# Patient Record
Sex: Male | Born: 1955 | Race: White | Hispanic: No | Marital: Married | Smoking: Former smoker
Health system: Southern US, Community
[De-identification: ages and names within clinical notes are randomized; demographics above are authoritative.]

## PROBLEM LIST (undated history)

## (undated) DIAGNOSIS — R011 Cardiac murmur, unspecified: Secondary | ICD-10-CM

## (undated) DIAGNOSIS — N289 Disorder of kidney and ureter, unspecified: Secondary | ICD-10-CM

## (undated) DIAGNOSIS — I10 Essential (primary) hypertension: Secondary | ICD-10-CM

## (undated) DIAGNOSIS — R7309 Other abnormal glucose: Secondary | ICD-10-CM

## (undated) DIAGNOSIS — A159 Respiratory tuberculosis unspecified: Secondary | ICD-10-CM

## (undated) DIAGNOSIS — M545 Low back pain, unspecified: Secondary | ICD-10-CM

## (undated) DIAGNOSIS — E119 Type 2 diabetes mellitus without complications: Secondary | ICD-10-CM

## (undated) DIAGNOSIS — R7611 Nonspecific reaction to tuberculin skin test without active tuberculosis: Secondary | ICD-10-CM

## (undated) DIAGNOSIS — Z87442 Personal history of urinary calculi: Secondary | ICD-10-CM

## (undated) HISTORY — DX: Low back pain, unspecified: M54.50

## (undated) HISTORY — PX: HERNIA REPAIR: SHX51

## (undated) HISTORY — PX: COLONOSCOPY: SHX174

## (undated) HISTORY — DX: Morbid (severe) obesity due to excess calories: E66.01

## (undated) HISTORY — PX: KNEE SURGERY: SHX244

## (undated) HISTORY — DX: Cardiac murmur, unspecified: R01.1

## (undated) HISTORY — PX: WISDOM TOOTH EXTRACTION: SHX21

## (undated) HISTORY — DX: Other abnormal glucose: R73.09

---
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
## 2007-10-09 ENCOUNTER — Ambulatory Visit (HOSPITAL_COMMUNITY): Admission: RE | Admit: 2007-10-09 | Discharge: 2007-10-09 | Payer: Self-pay | Admitting: Internal Medicine

## 2007-10-23 ENCOUNTER — Ambulatory Visit: Payer: Self-pay

## 2008-12-21 ENCOUNTER — Ambulatory Visit (HOSPITAL_COMMUNITY): Admission: RE | Admit: 2008-12-21 | Discharge: 2008-12-21 | Payer: Self-pay | Admitting: Internal Medicine

## 2009-12-16 DIAGNOSIS — Z87442 Personal history of urinary calculi: Secondary | ICD-10-CM

## 2009-12-16 HISTORY — DX: Personal history of urinary calculi: Z87.442

## 2010-04-15 HISTORY — PX: CYSTOSCOPY/RETROGRADE/URETEROSCOPY/STONE EXTRACTION WITH BASKET: SHX5317

## 2010-07-08 ENCOUNTER — Emergency Department (HOSPITAL_COMMUNITY): Admission: EM | Admit: 2010-07-08 | Discharge: 2010-07-08 | Payer: Self-pay | Admitting: Emergency Medicine

## 2010-07-26 ENCOUNTER — Ambulatory Visit (HOSPITAL_BASED_OUTPATIENT_CLINIC_OR_DEPARTMENT_OTHER): Admission: RE | Admit: 2010-07-26 | Discharge: 2010-07-26 | Payer: Self-pay | Admitting: Urology

## 2011-03-01 LAB — POCT I-STAT 4, (NA,K, GLUC, HGB,HCT)
Glucose, Bld: 114 mg/dL — ABNORMAL HIGH (ref 70–99)
HCT: 52 % (ref 39.0–52.0)
Potassium: 3.8 mEq/L (ref 3.5–5.1)
Sodium: 142 mEq/L (ref 135–145)

## 2011-03-02 LAB — POCT I-STAT, CHEM 8
Calcium, Ion: 0.88 mmol/L — ABNORMAL LOW (ref 1.12–1.32)
Creatinine, Ser: 1.1 mg/dL (ref 0.4–1.5)
Glucose, Bld: 92 mg/dL (ref 70–99)
Hemoglobin: 17 g/dL (ref 13.0–17.0)

## 2011-03-02 LAB — URINALYSIS, ROUTINE W REFLEX MICROSCOPIC
Protein, ur: NEGATIVE mg/dL
Specific Gravity, Urine: 1.024 (ref 1.005–1.030)
pH: 5 (ref 5.0–8.0)

## 2011-11-22 ENCOUNTER — Other Ambulatory Visit: Payer: Self-pay | Admitting: Orthopedic Surgery

## 2011-11-22 ENCOUNTER — Encounter (HOSPITAL_BASED_OUTPATIENT_CLINIC_OR_DEPARTMENT_OTHER): Payer: Self-pay | Admitting: *Deleted

## 2011-11-22 NOTE — Progress Notes (Signed)
Pt has had uri-treated-using tussinex as needed-no fever,said he is cleared up Had recent ekg at [facility]cp-[phone]-closed Friday-have to call Monday am for notes Arrive 1030 with daughter

## 2011-11-25 ENCOUNTER — Encounter (HOSPITAL_BASED_OUTPATIENT_CLINIC_OR_DEPARTMENT_OTHER): Payer: Self-pay | Admitting: *Deleted

## 2011-11-25 ENCOUNTER — Ambulatory Visit (HOSPITAL_BASED_OUTPATIENT_CLINIC_OR_DEPARTMENT_OTHER): Payer: BC Managed Care – PPO | Admitting: Anesthesiology

## 2011-11-25 ENCOUNTER — Encounter (HOSPITAL_BASED_OUTPATIENT_CLINIC_OR_DEPARTMENT_OTHER): Payer: Self-pay | Admitting: Anesthesiology

## 2011-11-25 ENCOUNTER — Encounter (HOSPITAL_BASED_OUTPATIENT_CLINIC_OR_DEPARTMENT_OTHER)
Admission: RE | Disposition: A | Discharge status: Home / Self Care | Payer: Self-pay | Source: Ambulatory Visit | Attending: Orthopedic Surgery

## 2011-11-25 ENCOUNTER — Ambulatory Visit (HOSPITAL_BASED_OUTPATIENT_CLINIC_OR_DEPARTMENT_OTHER)
Admission: RE | Admit: 2011-11-25 | Discharge: 2011-11-25 | Disposition: A | Discharge status: Home / Self Care | Payer: BC Managed Care – PPO | Source: Ambulatory Visit | Attending: Orthopedic Surgery | Admitting: Orthopedic Surgery

## 2011-11-25 DIAGNOSIS — M23329 Other meniscus derangements, posterior horn of medial meniscus, unspecified knee: Secondary | ICD-10-CM | POA: Insufficient documentation

## 2011-11-25 DIAGNOSIS — I10 Essential (primary) hypertension: Secondary | ICD-10-CM | POA: Insufficient documentation

## 2011-11-25 DIAGNOSIS — S83249A Other tear of medial meniscus, current injury, unspecified knee, initial encounter: Secondary | ICD-10-CM

## 2011-11-25 DIAGNOSIS — K08409 Partial loss of teeth, unspecified cause, unspecified class: Secondary | ICD-10-CM | POA: Insufficient documentation

## 2011-11-25 DIAGNOSIS — M23359 Other meniscus derangements, posterior horn of lateral meniscus, unspecified knee: Secondary | ICD-10-CM | POA: Insufficient documentation

## 2011-11-25 DIAGNOSIS — M2240 Chondromalacia patellae, unspecified knee: Secondary | ICD-10-CM | POA: Insufficient documentation

## 2011-11-25 DIAGNOSIS — F17200 Nicotine dependence, unspecified, uncomplicated: Secondary | ICD-10-CM | POA: Insufficient documentation

## 2011-11-25 HISTORY — DX: Essential (primary) hypertension: I10

## 2011-11-25 LAB — POCT I-STAT, CHEM 8
BUN: 20 mg/dL (ref 6–23)
Calcium, Ion: 1.16 mmol/L (ref 1.12–1.32)
Chloride: 102 mEq/L (ref 96–112)
HCT: 50 % (ref 39.0–52.0)
Sodium: 140 mEq/L (ref 135–145)

## 2011-11-25 SURGERY — ARTHROSCOPY, KNEE, WITH MEDIAL MENISCECTOMY
Anesthesia: General | Site: Knee | Laterality: Right | Wound class: Clean

## 2011-11-25 MED ORDER — OXYCODONE-ACETAMINOPHEN 5-325 MG PO TABS
1.0000 | ORAL_TABLET | ORAL | Status: AC | PRN
Start: 2011-11-25 — End: 2011-12-05

## 2011-11-25 MED ORDER — BUPIVACAINE HCL (PF) 0.25 % IJ SOLN
INTRAMUSCULAR | Status: DC | PRN
  Administered 2011-11-25: 20 mL

## 2011-11-25 MED ORDER — PROMETHAZINE HCL 25 MG/ML IJ SOLN: 6.2500 mg | INTRAMUSCULAR | Status: DC | PRN

## 2011-11-25 MED ORDER — CEFAZOLIN SODIUM-DEXTROSE 2-3 GM-% IV SOLR: 2.0000 g | INTRAVENOUS | Status: DC

## 2011-11-25 MED ORDER — FENTANYL CITRATE 0.05 MG/ML IJ SOLN
25.0000 ug | INTRAMUSCULAR | Status: DC | PRN
  Administered 2011-11-25 (×2): 25 ug via INTRAVENOUS
  Administered 2011-11-25: 50 ug via INTRAVENOUS

## 2011-11-25 MED ORDER — MIDAZOLAM HCL 5 MG/5ML IJ SOLN
INTRAMUSCULAR | Status: DC | PRN
  Administered 2011-11-25: 2 mg via INTRAVENOUS

## 2011-11-25 MED ORDER — POVIDONE-IODINE 7.5 % EX SOLN: Freq: Once | CUTANEOUS | Status: DC

## 2011-11-25 MED ORDER — OXYCODONE-ACETAMINOPHEN 5-325 MG PO TABS: 1.0000 | ORAL_TABLET | Freq: Once | ORAL | Status: DC

## 2011-11-25 MED ORDER — PROPOFOL 10 MG/ML IV EMUL
INTRAVENOUS | Status: DC | PRN
  Administered 2011-11-25: 200 mg via INTRAVENOUS

## 2011-11-25 MED ORDER — ONDANSETRON HCL 4 MG/2ML IJ SOLN
INTRAMUSCULAR | Status: DC | PRN
  Administered 2011-11-25: 4 mg via INTRAVENOUS

## 2011-11-25 MED ORDER — FENTANYL CITRATE 0.05 MG/ML IJ SOLN
INTRAMUSCULAR | Status: DC | PRN
  Administered 2011-11-25: 50 ug via INTRAVENOUS
  Administered 2011-11-25 (×2): 25 ug via INTRAVENOUS
  Administered 2011-11-25: 50 ug via INTRAVENOUS

## 2011-11-25 MED ORDER — CEFAZOLIN SODIUM 1-5 GM-% IV SOLN
INTRAVENOUS | Status: DC | PRN
  Administered 2011-11-25: 2 g via INTRAVENOUS

## 2011-11-25 MED ORDER — LACTATED RINGERS IV SOLN
INTRAVENOUS | Status: DC
  Administered 2011-11-25 (×2): via INTRAVENOUS

## 2011-11-25 MED ORDER — DEXAMETHASONE SODIUM PHOSPHATE 4 MG/ML IJ SOLN
INTRAMUSCULAR | Status: DC | PRN
  Administered 2011-11-25: 10 mg via INTRAVENOUS

## 2011-11-25 MED ORDER — SODIUM CHLORIDE 0.9 % IR SOLN
Status: DC | PRN
  Administered 2011-11-25: 3000 mL

## 2011-11-25 SURGICAL SUPPLY — 27 items
BANDAGE ELASTIC 6 VELCRO ST LF (GAUZE/BANDAGES/DRESSINGS) ×2 IMPLANT
BLADE 4.2CUDA (BLADE) ×2 IMPLANT
BLADE CUTTER GATOR 3.5 (BLADE) IMPLANT
BLADE CUTTER MENIS 5.5 (BLADE) IMPLANT
CANISTER OMNI JUG 16 LITER (MISCELLANEOUS) IMPLANT
CANISTER SUCTION 2500CC (MISCELLANEOUS) IMPLANT
CHLORAPREP W/TINT 26ML (MISCELLANEOUS) ×2 IMPLANT
CLOTH BEACON ORANGE TIMEOUT ST (SAFETY) ×2 IMPLANT
DRAPE ARTHROSCOPY W/POUCH 114 (DRAPES) ×2 IMPLANT
DRSG EMULSION OIL 3X3 NADH (GAUZE/BANDAGES/DRESSINGS) ×2 IMPLANT
GLOVE BIO SURGEON STRL SZ 6.5 (GLOVE) ×2 IMPLANT
GLOVE BIO SURGEON STRL SZ7.5 (GLOVE) ×2 IMPLANT
GLOVE BIOGEL PI IND STRL 7.0 (GLOVE) ×1 IMPLANT
GLOVE BIOGEL PI IND STRL 8 (GLOVE) ×1 IMPLANT
GLOVE BIOGEL PI INDICATOR 7.0 (GLOVE) ×1
GLOVE BIOGEL PI INDICATOR 8 (GLOVE) ×1
GOWN PREVENTION PLUS XLARGE (GOWN DISPOSABLE) ×2 IMPLANT
KNEE WRAP E Z 3 GEL PACK (MISCELLANEOUS) ×2 IMPLANT
PACK ARTHROSCOPY DSU (CUSTOM PROCEDURE TRAY) ×2 IMPLANT
PACK BASIN DAY SURGERY FS (CUSTOM PROCEDURE TRAY) ×2 IMPLANT
SPONGE GAUZE 4X4 12PLY (GAUZE/BANDAGES/DRESSINGS) ×2 IMPLANT
SUT ETHILON 4 0 PS 2 18 (SUTURE) ×2 IMPLANT
TOWEL OR 17X24 6PK STRL BLUE (TOWEL DISPOSABLE) ×2 IMPLANT
TOWEL OR NON WOVEN STRL DISP B (DISPOSABLE) ×2 IMPLANT
TUBING ARTHROSCOPY IRRIG 16FT (MISCELLANEOUS) ×2 IMPLANT
WAND STAR VAC 90 (SURGICAL WAND) IMPLANT
WATER STERILE IRR 1000ML POUR (IV SOLUTION) ×2 IMPLANT

## 2011-11-25 NOTE — Anesthesia Procedure Notes (Signed)
Procedure Name: LMA Insertion Date/Time: 11/25/2011 12:54 PM Performed by: Signa Kell Pre-anesthesia Checklist: Patient identified, Emergency Drugs available, Suction available and Patient being monitored Patient Re-evaluated:Patient Re-evaluated prior to inductionOxygen Delivery Method: Circle System Utilized Preoxygenation: Pre-oxygenation with 100% oxygen Intubation Type: IV induction Ventilation: Mask ventilation without difficulty LMA: LMA inserted LMA Size: 5.0 Number of attempts: 1 Airway Equipment and Method: bite block Placement Confirmation: positive ETCO2 Tube secured with: Tape Dental Injury: Teeth and Oropharynx as per pre-operative assessment

## 2011-11-25 NOTE — H&P (Signed)
Aaron Burns is an 55 y.o. male.   Chief Complaint: R knee pain and swelling  HPI: R knee pain and swelling x several years, MRI shows post MMT.  Past Medical History  Diagnosis Date  . Hypertension   . Upper respiratory infection 2012    recently tx-no fever now-clearing up  . Chronic kidney disease 5/11    kidney stone  . Flu 11/17/11    pt. states went to urgent care, had flu, took tamiflu, felt better in 2 days.    Past Surgical History  Procedure Date  . Hernia repair     as a baby  . Cystoscopy/retrograde/ureteroscopy/stone extraction with basket 5/11  . Wisdom tooth extraction     History reviewed. No pertinent family history. Social History:  reports that he has been smoking.  He does not have any smokeless tobacco history on file. He reports that he drinks alcohol. He reports that he does not use illicit drugs.  Allergies: No Known Allergies  Medications Prior to Admission  Medication Dose Route Frequency Provider Last Rate Last Dose  . ceFAZolin (ANCEF) IVPB 2 g/50 mL premix  2 g Intravenous 60 min Pre-Op       . lactated ringers infusion   Intravenous Continuous E. Jairo Ben, MD 20 mL/hr at 11/25/11 1058    . povidone-iodine (BETADINE) 7.5 % scrub   Topical Once        Medications Prior to Admission  Medication Sig Dispense Refill  . Ascorbic Acid (VITAMIN C) 1000 MG tablet Take 1,000 mg by mouth daily.        . chlorpheniramine-HYDROcodone (TUSSIONEX) 10-8 MG/5ML LQCR Take 5 mLs by mouth every 6 (six) hours as needed.        [name] co-enzyme Q-10 30 MG capsule Take 30 mg by mouth 3 (three) times daily.        . fish oil-omega-3 fatty acids 1000 MG capsule Take 2 g by mouth daily.        . hydrochlorothiazide (HYDRODIURIL) 25 MG tablet Take 25 mg by mouth daily.        [name] lisinopril (PRINIVIL,ZESTRIL) 10 MG tablet Take 10 mg by mouth daily.        . Multiple Vitamin (MULTIVITAMIN) capsule Take 1 capsule by mouth daily.        . nicotine polacrilex (NICORETTE) 2 MG  gum Take 2 mg by mouth as needed.          Results for orders placed during the hospital encounter of 11/25/11 (from the past 48 hour(s))  POCT I-STAT, CHEM 8     Status: Abnormal   Collection Time   11/25/11 11:12 AM      Component Value Range Comment   Sodium 140  135 - 145 (mEq/L)    Potassium 4.1  3.5 - 5.1 (mEq/L)    Chloride 102  96 - 112 (mEq/L)    BUN 20  6 - 23 (mg/dL)    Creatinine, Ser 4.09  0.50 - 1.35 (mg/dL)    Glucose, Bld 811 (*) 70 - 99 (mg/dL)    Calcium, Ion 9.14  1.12 - 1.32 (mmol/L)    TCO2 29  0 - 100 (mmol/L)    Hemoglobin 17.0  13.0 - 17.0 (g/dL)    HCT 78.2  95.6 - 21.3 (%)    No results found.  Review of Systems  All other systems reviewed and are negative.    Blood pressure 152/78, pulse 57, temperature 97.8 F (36.6 C), temperature  source Oral, resp. rate 18, height 5\' 9"  (1.753 m), weight 104.327 kg (230 lb), SpO2 96.00%. Physical Exam  Constitutional: He is oriented to person, place, and time. He appears well-developed and well-nourished.  HENT:  Head: Atraumatic.  Eyes: EOM are normal.  Neck: Neck supple.  Cardiovascular: Intact distal pulses.   Respiratory: Effort normal.  GI: Soft.  Musculoskeletal: Normal range of motion.       Right knee: tenderness found. Medial joint line tenderness noted.  Neurological: He is alert and oriented to person, place, and time.  Skin: Skin is warm and dry.  Psychiatric: He has a normal mood and affect.     Assessment/Plan R knee medial meniscus tear, failed non op mgmnt. Risks / benefits of surgery discussed Consent on chart  NPO for OR Preop antibiotics    Aaron Burns WILLIAM 11/25/2011, 12:18 PM

## 2011-11-25 NOTE — Anesthesia Postprocedure Evaluation (Signed)
  Anesthesia Post-op Note  Patient: Aaron Burns  Procedure(s) Performed:  KNEE ARTHROSCOPY WITH MEDIAL MENISECTOMY - Medial and Lateral Menisectomy Chondroplasty Trochlea and Medial and Lateral Femoral Condyle  Patient Location: PACU  Anesthesia Type: General  Level of Consciousness: awake, alert  and oriented  Airway and Oxygen Therapy: Patient Spontanous Breathing  Post-op Pain: none  Post-op Assessment: Post-op Vital signs reviewed, Patient's Cardiovascular Status Stable, Respiratory Function Stable, Patent Airway, No signs of Nausea or vomiting and Pain level controlled  Post-op Vital Signs: Reviewed and stable  Complications: No apparent anesthesia complications

## 2011-11-25 NOTE — Brief Op Note (Signed)
11/25/2011  1:40 PM  PATIENT:  Aaron Burns  55 y.o. 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  PROCEDURE:  Procedure(s): KNEE ARTHROSCOPY WITH MEDIAL MENISECTOMY, partial lateral meniscectomy and chondroplasty of MFC, LFC and trochlea  SURGEON:  Surgeon(s): Mable Paris, MD  PHYSICIAN ASSISTANT:   ASSISTANTS: none   ANESTHESIA:   general  EBL:  Total I/O In: 1000 [I.V.:1000] Out: - ebl min  BLOOD ADMINISTERED:none  DRAINS: none   LOCAL MEDICATIONS USED:  1/4% MARCAINE 20 CC  SPECIMEN:  No Specimen  DISPOSITION OF SPECIMEN:  N/A  COUNTS:  YES  TOURNIQUET:  * No tourniquets in log *  DICTATION: .Other Dictation: Dictation Number [id number]  PLAN OF CARE: Discharge to home after PACU  PATIENT DISPOSITION:  PACU - hemodynamically stable.   Delay start of Pharmacological VTE agent (>24hrs) due to surgical blood loss or risk of bleeding:  {YES/NO/NOT APPLICABLE:20182

## 2011-11-25 NOTE — Transfer of Care (Signed)
Immediate Anesthesia Transfer of Care Note  Patient: Aaron Burns  Procedure(s) Performed:  KNEE ARTHROSCOPY WITH MEDIAL MENISECTOMY - Medial and Lateral Menisectomy Chondroplasty Trochlea and Medial and Lateral Femoral Condyle  Patient Location: PACU  Anesthesia Type: General  Level of Consciousness: sedated  Airway & Oxygen Therapy: Patient Spontanous Breathing and Patient connected to face mask oxygen  Post-op Assessment: Report given to PACU RN and Post -op Vital signs reviewed and stable  Post vital signs: Reviewed and stable  Complications: No apparent anesthesia complications

## 2011-11-25 NOTE — Op Note (Signed)
NAMEABDULLOH, ULLOM NO.:  [id number]  MEDICAL RECORD NO.:  [id number]  LOCATION:                                 FACILITY:  PHYSICIAN:  Jones Broom, MD         DATE OF BIRTH:  DATE OF PROCEDURE:  11/25/2011 DATE OF DISCHARGE:                              OPERATIVE REPORT   PREOPERATIVE DIAGNOSES: 1. Right knee posterior horn medial meniscus tear. 2. Tricompartmental degenerative changes.  POSTOPERATIVE DIAGNOSES: 1. Right knee radial posterior horn medial meniscus tear. 2. Right knee lateral meniscus tear. 3. 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  PROCEDURE PERFORMED: 1. Right knee partial medial meniscectomy. 2. Right knee partial lateral meniscectomy. 3. Right knee chondroplasty of the medial femoral condyle and lateral     femoral condyle and trochlea.  ATTENDING SURGEON:  Jones Broom, M.D.  ASSISTANT:  None.  ANESTHESIA:  GETA with postoperative local anesthesia.  COMPLICATIONS:  None.  DRAINS:  None.  SPECIMENS:  None.  ESTIMATED BLOOD LOSS:  Minimal.  INDICATION FOR SURGERY:  The patient is a 55 year old gentleman with a long history of right knee pain with recurrent swelling and mechanical symptoms.  MRI revealed a posterior horn medial meniscus tear and some degenerative changes throughout the joint.  He wished to go ahead with arthroscopic treatment having failed nonoperative management.  He understood risks, benefits, alternatives of procedure, and wished to go ahead with it.  OPERATIVE FINDINGS:  Examination under anesthesia demonstrated no laxity or instability of ACL, PCL, or collaterals.  Range of motion was good. Trace knee effusion.  No warmth or erythema.  Diagnostic arthroscopy revealed the patellofemoral articulation to be normal.  There was a centrally a small area of  grade 3 and centrally grade 4 chondromalacia of the femoral trochlea with a small area of exposed bone.  There was some synovitis in the medial compartment.  The posterior horn medial meniscus had a small radial tear just off of the root.  This was debrided back to a stable base and the root was carefully probed, and the remaining meniscus was stable and non- displaceable.  There was some grade 3 changes on the medial femoral condyle diffusely, which were gently lightly debrided with the shaver. The lateral meniscus was noted to have a small radial tear at the junction of the posterior horn and body, extending into about a third of the width of the meniscus.  This was debrided back to a stable base with a biter and shaver.  There was a small area of grade 4 chondromalacia on the lateral femoral condyle along the articular weightbearing portion, which was debrided back to stable articular margins.  The small area in the trochlea was also debrided.  The ACL and PCL were intact.  No loose bodies were noted.  PROCEDURE:  The patient was identified in the preoperative holding area where I personally marked the operative site after verifying site, side, and procedure with the patient.  He was taken back to the  operating room where general anesthesia was induced without complication.  The right lower extremity is prepped and draped in a standard sterile fashion and placed in a leg-holder.  The foot of the bed was dropped away and the nonoperative extremity was carefully padded in position.  Appropriate time-out of the procedure was carried out.  The patient did receive preoperative Ancef IV.  After sterile prepping and draping, a standard lateral portal was established.  The arthroscope was introduced into the joint with diagnostic arthroscopy, the findings as described above. Medial portal was established just above the anterior horn medial meniscus under direct visualization.  The shaver was  used through this portal to perform debridement as described above.  The biter was also used to perform partial medial and lateral meniscectomies as described above.  Once the articular and meniscal work was completed as described in the findings, the shaver was again run in the joint to ensure no retain loose fragments and second tour of the joint revealed no further loose bodies.  The ACL and PCL were completely intact.  The arthroscopic equipment was then removed from the joint and the knee was insufflated with 20 mL of 0.25% Marcaine without epinephrine for postoperative pain control.  The portals were closed with 3-0 nylon in an interrupted fashion.  Sterile dressings were applied including Adaptic, 4x4s, ABDs, Webril, and a loosely wrapped Ace bandage around the knee.  He was then allowed to awake from general anesthesia, transferred to the stretcher and taken to the recovery room in stable condition.  POSTOPERATIVE PLAN:  He will be discharged home with his family today. He will follow up in 1 week for suture removal and wound check.  Begin some gentle range of motion exercises when comfortable.  He would be using icing.  He will be discharged home with Percocet for pain control. He will have aspirin daily over the next few weeks for VTE prophylaxis.     Jones Broom, MD     JC/MEDQ  D:  11/25/2011  T:  11/25/2011  Job:  [id number]

## 2011-11-25 NOTE — Anesthesia Preprocedure Evaluation (Addendum)
Anesthesia Evaluation  Patient identified by MRN, date of birth, ID band Patient awake    Reviewed: Allergy & Precautions, H&P , NPO status , Patient's Chart, lab work & pertinent test results  Airway Mallampati: II TM Distance: >3 FB Neck ROM: Full    Dental  (+) Partial Upper, Partial Lower, Chipped, Poor Dentition and Dental Advisory Given   Pulmonary Current Smoker,  clear to auscultation  Pulmonary exam normal       Cardiovascular hypertension (lisinopril), Pt. on medications Regular Normal    Neuro/Psych    GI/Hepatic negative GI ROS, Neg liver ROS,   Endo/Other  Morbid obesity  Renal/GU negative Renal ROS     Musculoskeletal   Abdominal (+) obese,   Peds  Hematology negative hematology ROS (+)   Anesthesia Other Findings   Reproductive/Obstetrics                          Anesthesia Physical Anesthesia Plan  ASA: II  Anesthesia Plan: General   Post-op Pain Management:    Induction: Intravenous  Airway Management Planned: LMA  Additional Equipment:   Intra-op Plan:   Post-operative Plan:   Informed Consent: I have reviewed the patients History and Physical, chart, labs and discussed the procedure including the risks, benefits and alternatives for the proposed anesthesia with the patient or authorized representative who has indicated his/her understanding and acceptance.   Dental advisory given  Plan Discussed with: CRNA and Surgeon  Anesthesia Plan Comments: (Plan routine monitors, GA- LMA OK)        Anesthesia Quick Evaluation

## 2012-06-09 ENCOUNTER — Ambulatory Visit (INDEPENDENT_AMBULATORY_CARE_PROVIDER_SITE_OTHER): Payer: BC Managed Care – PPO | Admitting: Surgery

## 2012-06-09 ENCOUNTER — Encounter (INDEPENDENT_AMBULATORY_CARE_PROVIDER_SITE_OTHER): Payer: Self-pay | Admitting: Surgery

## 2012-06-09 VITALS — BP 117/62 | HR 77 | Temp 98.6°F | Ht 69.0 in | Wt 243.4 lb

## 2012-06-09 DIAGNOSIS — K8020 Calculus of gallbladder without cholecystitis without obstruction: Secondary | ICD-10-CM

## 2012-06-09 DIAGNOSIS — R109 Unspecified abdominal pain: Secondary | ICD-10-CM

## 2012-06-09 NOTE — Patient Instructions (Signed)
Abdominal or Pelvic Ultrasound Ultrasound uses harmless sound waves instead of X-rays to take pictures of the inside of your body. A probe or wand device (transducer) is held up against your body to capture these pictures. The continually changing images can be recorded on videotape or film. Diagnostic ultrasound imaging is commonly called sonography or ultrasonography. There are different types of ultrasound exams. An ultrasound of the gallbladder, liver, and pancreas can show gallstones, masses, cysts, inflammation, infection, or enlarged organs. An ultrasound of the kidneys can show cysts, masses, kidney stones, and kidney size and shape. A pelvic ultrasound can show the uterus, ovaries, and cysts or masses. An obstetrical ultrasound shows the position of the fetus, measurements for maturity, fetal heartbeat, and fetal organs. A breast, thyroid, or testicular ultrasound can show if a nodule is solid or cystic. RISKS AND COMPLICATIONS Ultrasound has been used for many years and has never shown any harmful effects. Studies in humans have shown no direct link between the use of ultrasound and adverse outcomes. BEFORE THE PROCEDURE Other than drinking water, do not eat or drink for 8 to 12 hours before the test or as directed by your caregiver. Follow any other diet instructions from your caregiver. If you are having a pelvic ultrasound, you may need to drink a lot of liquid before the exam. A full bladder helps to see the organs behind the bladder better. PROCEDURE  There is no pain in an ultrasound exam. A gel is applied to your skin, and the transducer is then placed on the area to be examined. The gel may feel cool. The gel wipes off easily, but it is a good idea to wear clothing that is easily washable. The images from inside your body are displayed on one or more monitors that look like small television screens. The returning sound waves produce pictures of the organs that were in the path of the sound  sent from the transducer. The room is usually darkened during the exam. This makes it easier to see the images on the monitor. The ultrasound exam should take less than 1 hour. AFTER THE PROCEDURE You can safely drive home and return to regular activities immediately after the exam. Ask when your test results will be ready. Make sure you get your test results. Document Released: 11/29/2000 Document Revised: 11/21/2011 Document Reviewed: 05/23/2011 [employer] Patient Information 2012 [employer]. 

## 2012-06-09 NOTE — Progress Notes (Signed)
Patient ID: Aaron Burns, male   DOB: March 06, 1956, 56 y.o.   MRN: [id number]  Chief Complaint  Patient presents with  . Pre-op Exam    eval gallbladder with stones    HPI Aaron Burns is a 56 y.o. male.   HPIPatient presents at the request of Dr. Annabell Howells of urology due to right lower back pain. He was evaluated by Dr. Ma Rings by CT which showed a large gallstone but no kidney stone. He has a two-week history of constant right lower back pain. Eating does not make it better or worse. Denies nausea or vomiting. The pain seems to be more along his right flank and right side now is constant in nature. Nothing seems to make it better or worse. He does have more pain when he twists or movements.  Past Medical History  Diagnosis Date  . Hypertension   . Upper respiratory infection 2012    recently tx-no fever now-clearing up  . Chronic kidney disease 5/11    kidney stone  . Flu 11/17/11    pt. states went to urgent care, had flu, took tamiflu, felt better in 2 days.  [name] Heart murmur     at birth    Past Surgical History  Procedure Date  . Hernia repair     as a baby  . Cystoscopy/retrograde/ureteroscopy/stone extraction with basket 5/11  . Wisdom tooth extraction   . Knee surgery 11/25/11    Family History  Problem Relation Age of Onset  . Hypertension Mother     Social History History  Substance Use Topics  . Smoking status: Current Some Day Smoker -- 0.2 packs/day    Types: Cigarettes  . Smokeless tobacco: Not on file   Comment: 2-3 cigs a day  . Alcohol Use: Yes     occasional    No Known Allergies  Current Outpatient Prescriptions  Medication Sig Dispense Refill  . Ascorbic Acid (VITAMIN C) 1000 MG tablet Take 1,000 mg by mouth daily.        . chlorpheniramine-HYDROcodone (TUSSIONEX) 10-8 MG/5ML LQCR Take 5 mLs by mouth every 6 (six) hours as needed.        [name] co-enzyme Q-10 30 MG capsule Take 30 mg by mouth 3 (three) times daily.        . fish oil-omega-3 fatty acids 1000 MG  capsule Take 2 g by mouth daily.        . hydrochlorothiazide (HYDRODIURIL) 25 MG tablet Take 25 mg by mouth daily.        [name] lisinopril (PRINIVIL,ZESTRIL) 10 MG tablet Take 10 mg by mouth daily.        . meloxicam (MOBIC) 15 MG tablet Take 15 mg by mouth daily.      . Multiple Vitamin (MULTIVITAMIN) capsule Take 1 capsule by mouth daily.        . nicotine polacrilex (NICORETTE) 2 MG gum Take 2 mg by mouth as needed.          Review of Systems Review of Systems  Constitutional: Negative for fever, chills and unexpected weight change.  HENT: Negative for hearing loss, congestion, sore throat, trouble swallowing and voice change.   Eyes: Negative for visual disturbance.  Respiratory: Negative for cough and wheezing.   Cardiovascular: Negative for chest pain, palpitations and leg swelling.  Gastrointestinal: Negative for nausea, vomiting, abdominal pain, diarrhea, constipation, blood in stool, abdominal distention, anal bleeding and rectal pain.  Genitourinary: Negative for hematuria and difficulty urinating.  Musculoskeletal: Negative for arthralgias.  Skin: Negative for rash and wound.  Neurological: Negative for seizures, syncope, weakness and headaches.  Hematological: Negative for adenopathy. Does not bruise/bleed easily.  Psychiatric/Behavioral: Negative for confusion.    Blood pressure 117/62, pulse 77, temperature 98.6 F (37 C), temperature source Temporal, height 5\' 9"  (1.753 m), weight 243 lb 6.4 oz (110.406 kg), SpO2 96.00%.  Physical Exam Physical Exam  Constitutional: He is oriented to person, place, and time. He appears well-developed and well-nourished.  HENT:  Head: Normocephalic and atraumatic.  Eyes: EOM are normal. Pupils are equal, round, and reactive to light.  Neck: Normal range of motion. Neck supple.  Cardiovascular: Normal rate and regular rhythm.   Pulmonary/Chest: Effort normal and breath sounds normal.  Abdominal: Soft. Bowel sounds are normal. There is no  tenderness. There is no rebound.  Musculoskeletal:       Arms: Neurological: He is alert and oriented to person, place, and time.  Skin: Skin is warm and dry.  Psychiatric: He has a normal mood and affect. His behavior is normal. Judgment and thought content normal.    Data Reviewed CT urogram gallstone noted  Assessment    Right flank and low back pain.      Plan    U/S of gallbladder.  Return to clinic after U/S.Symptoms atypical for gallbladder disease but further workup required to determine this.       [name] A. 06/09/2012, 11:38 AM

## 2012-06-11 ENCOUNTER — Ambulatory Visit
Admission: RE | Admit: 2012-06-11 | Discharge: 2012-06-11 | Disposition: A | Discharge status: Home / Self Care | Payer: BC Managed Care – PPO | Source: Ambulatory Visit | Attending: Surgery | Admitting: Surgery

## 2012-06-11 DIAGNOSIS — K8020 Calculus of gallbladder without cholecystitis without obstruction: Secondary | ICD-10-CM

## 2012-06-12 ENCOUNTER — Telehealth (INDEPENDENT_AMBULATORY_CARE_PROVIDER_SITE_OTHER): Payer: Self-pay

## 2012-06-12 NOTE — Telephone Encounter (Signed)
[place] results and follow up appointment given to patient for 06/25/12 @ 4:50 pm w/Dr. Luisa Hart.

## 2012-06-12 NOTE — Telephone Encounter (Signed)
Message copied by Maryan Puls on Fri Jun 12, 2012  6:24 PM ------      Message from: Harriette Bouillon A      Created: Fri Jun 12, 2012 11:00 AM       Gallstones no cholecystitis.  Has follow up appointment .

## 2012-06-25 ENCOUNTER — Encounter (INDEPENDENT_AMBULATORY_CARE_PROVIDER_SITE_OTHER): Payer: Self-pay | Admitting: Surgery

## 2012-06-25 ENCOUNTER — Ambulatory Visit (INDEPENDENT_AMBULATORY_CARE_PROVIDER_SITE_OTHER): Payer: BC Managed Care – PPO | Admitting: Surgery

## 2012-06-25 VITALS — BP 142/90 | HR 76 | Temp 98.4°F | Resp 18 | Ht 69.0 in | Wt 245.4 lb

## 2012-06-25 DIAGNOSIS — K8020 Calculus of gallbladder without cholecystitis without obstruction: Secondary | ICD-10-CM

## 2012-06-25 NOTE — Patient Instructions (Addendum)
Laparoscopic Cholecystectomy Laparoscopic cholecystectomy is surgery to remove the gallbladder. The gallbladder is located slightly to the right of center in the abdomen, behind the liver. It is a concentrating and storage sac for the bile produced in the liver. Bile aids in the digestion and absorption of fats. Gallbladder disease (cholecystitis) is an inflammation of your gallbladder. This condition is usually caused by a buildup of gallstones (cholelithiasis) in your gallbladder. Gallstones can block the flow of bile, resulting in inflammation and pain. In severe cases, emergency surgery may be required. When emergency surgery is not required, you will have time to prepare for the procedure. Laparoscopic surgery is an alternative to open surgery. Laparoscopic surgery usually has a shorter recovery time. Your common bile duct may also need to be examined and explored. Your caregiver will discuss this with you if he or she feels this should be done. If stones are found in the common bile duct, they may be removed. LET YOUR CAREGIVER KNOW ABOUT:  Allergies to food or medicine.   Medicines taken, including vitamins, herbs, eyedrops, over-the-counter medicines, and creams.   Use of steroids (by mouth or creams).   Previous problems with anesthetics or numbing medicines.   History of bleeding problems or blood clots.   Previous surgery.   Other health problems, including diabetes and kidney problems.   Possibility of pregnancy, if this applies.  RISKS AND COMPLICATIONS All surgery is associated with risks. Some problems that may occur following this procedure include:  Infection.   Damage to the common bile duct, nerves, arteries, veins, or other internal organs such as the stomach or intestines.   Bleeding.   A stone may remain in the common bile duct.  BEFORE THE PROCEDURE  Do not take aspirin for 3 days prior to surgery or blood thinners for 1 week prior to surgery.   Do not eat or  drink anything after midnight the night before surgery.   Let your caregiver know if you develop a cold or other infectious problem prior to surgery.   You should be present 60 minutes before the procedure or as directed.  PROCEDURE  You will be given medicine that makes you sleep (general anesthetic). When you are asleep, your surgeon will make several small cuts (incisions) in your abdomen. One of these incisions is used to insert a small, lighted scope (laparoscope) into the abdomen. The laparoscope helps the surgeon see into your abdomen. Carbon dioxide gas will be pumped into your abdomen. The gas allows more room for the surgeon to perform your surgery. Other operating instruments are inserted through the other incisions. Laparoscopic procedures may not be appropriate when:  There is major scarring from previous surgery.   The gallbladder is extremely inflamed.   There are bleeding disorders or unexpected cirrhosis of the liver.   A pregnancy is near term.   Other conditions make the laparoscopic procedure impossible.  If your surgeon feels it is not safe to continue with a laparoscopic procedure, he or she will perform an open abdominal procedure. In this case, the surgeon will make an incision to open the abdomen. This gives the surgeon a larger view and field to work within. This may allow the surgeon to perform procedures that sometimes cannot be performed with a laparoscope alone. Open surgery has a longer recovery time. AFTER THE PROCEDURE  You will be taken to the recovery area where a nurse will watch and check your progress.   You may be allowed to go home   the same day.   Do not resume physical activities until directed by your caregiver.   You may resume a normal diet and activities as directed.  Document Released: 12/02/2005 Document Revised: 11/21/2011 Document Reviewed: 05/17/2011 [facility] Patient Information 2012 [place], [place].  Laparoscopic Cholecystectomy Care  After These instructions give you information on caring for yourself after your procedure. Your doctor may also give you more specific instructions. Call your doctor if you have any problems or questions after your procedure. HOME CARE  Change your bandages (dressings) as told by your doctor.   Keep the wound dry and clean. Wash the wound gently with soap and water. Pat the wound dry with a clean towel.   Do not take baths, swim, or use hot tubs for 10 days, or as told by your doctor.   Only take medicine as told by your doctor.   Eat a normal diet as told by your doctor.   Do not lift anything heavier than 25 pounds (11.5 kg), or as told by your doctor.   Do not play contact sports for 1 week, or as told by your doctor.  GET HELP RIGHT AWAY IF:   Your wound is red, puffy (swollen), or painful.   You have yellowish-white fluid (pus) coming from the wound.   You have fluid draining from the wound for more than 1 day.   You have a bad smell coming from the wound.   Your wound breaks open.   You have a rash.   You have trouble breathing.   You have chest pain.   You have a bad reaction to your medicine.   You have a fever.   You have pain in the shoulders (shoulder strap areas).   You feel dizzy or pass out (faint).   You have severe belly (abdominal) pain.   You feel sick to your stomach (nauseous) or throw up (vomit) for more than 1 day.  MAKE SURE YOU:  Understand these instructions.   Will watch your condition.   Will get help right away if you are not doing well or get worse.  Document Released: 09/10/2008 Document Revised: 11/21/2011 Document Reviewed: 05/21/2011 [facility] Patient Information 2012 [place], [place].

## 2012-06-25 NOTE — Progress Notes (Signed)
Patient ID: Aaron Burns, male   DOB: 03-18-1956, 56 y.o.   MRN: [id number]  Chief Complaint  Patient presents with  . Follow-up    reck GB    HPI Field Aaron Burns is a 56 y.o. male.   HPIPatient presents at the request of Dr. Annabell Howells of urology due to right lower back pain. He was evaluated by Dr. Annabell Howells and by CT which showed a large gallstone but no kidney stone. He has a two-week history of constant right lower back pain. Eating does not make it better or worse. Denies nausea or vomiting. The pain seems to be more along his right flank and right side now is constant in nature. Nothing seems to make it better or worse. He does have more pain when he twists or movements. U/S was done which shows multiple gallstones without cholecystitis.  CBD  Normal caliber  Past Medical History  Diagnosis Date  . Hypertension   . Upper respiratory infection 2012    recently tx-no fever now-clearing up  . Chronic kidney disease 5/11    kidney stone  . Flu 11/17/11    pt. states went to urgent care, had flu, took tamiflu, felt better in 2 days.  [name] Heart murmur     at birth    Past Surgical History  Procedure Date  . Hernia repair     as a baby  . Cystoscopy/retrograde/ureteroscopy/stone extraction with basket 5/11  . Wisdom tooth extraction   . Knee surgery 11/25/11    Family History  Problem Relation Age of Onset  . Hypertension Mother     Social History History  Substance Use Topics  . Smoking status: Current Some Day Smoker -- 0.2 packs/day    Types: Cigarettes  . Smokeless tobacco: Not on file   Comment: 2-3 cigs a day  . Alcohol Use: Yes     occasional    No Known Allergies  Current Outpatient Prescriptions  Medication Sig Dispense Refill  . Ascorbic Acid (VITAMIN C) 1000 MG tablet Take 1,000 mg by mouth daily.        . chlorpheniramine-HYDROcodone (TUSSIONEX) 10-8 MG/5ML LQCR Take 5 mLs by mouth every 6 (six) hours as needed.        [name] co-enzyme Q-10 30 MG capsule Take 30 mg by mouth 3  (three) times daily.        . fish oil-omega-3 fatty acids 1000 MG capsule Take 2 g by mouth daily.        . hydrochlorothiazide (HYDRODIURIL) 25 MG tablet Take 25 mg by mouth daily.        [name] lisinopril (PRINIVIL,ZESTRIL) 10 MG tablet Take 10 mg by mouth daily.        . meloxicam (MOBIC) 15 MG tablet Take 15 mg by mouth daily.      . Multiple Vitamin (MULTIVITAMIN) capsule Take 1 capsule by mouth daily.        . nicotine polacrilex (NICORETTE) 2 MG gum Take 2 mg by mouth as needed.          Review of Systems Review of Systems  Constitutional: Negative for fever, chills and unexpected weight change.  HENT: Negative for hearing loss, congestion, sore throat, trouble swallowing and voice change.   Eyes: Negative for visual disturbance.  Respiratory: Negative for cough and wheezing.   Cardiovascular: Negative for chest pain, palpitations and leg swelling.  Gastrointestinal: Negative for nausea, vomiting, abdominal pain, diarrhea, constipation, blood in stool, abdominal distention, anal bleeding and rectal pain.  Genitourinary:  Negative for hematuria and difficulty urinating.  Musculoskeletal: Negative for arthralgias.  Skin: Negative for rash and wound.  Neurological: Negative for seizures, syncope, weakness and headaches.  Hematological: Negative for adenopathy. Does not bruise/bleed easily.  Psychiatric/Behavioral: Negative for confusion.    Blood pressure 142/90, pulse 76, temperature 98.4 F (36.9 C), temperature source Temporal, resp. rate 18, height 5\' 9"  (1.753 m), weight 245 lb 6.4 oz (111.313 kg).  Physical Exam Physical Exam  Constitutional: He is oriented to person, place, and time. He appears well-developed and well-nourished.  HENT:  Head: Normocephalic and atraumatic.  Eyes: EOM are normal. Pupils are equal, round, and reactive to light.  Neck: Normal range of motion. Neck supple.  Cardiovascular: Normal rate and regular rhythm.   Pulmonary/Chest: Effort normal and  breath sounds normal.  Abdominal: Soft. Bowel sounds are normal. There is no tenderness. There is no rebound.  Musculoskeletal:       Arms: Neurological: He is alert and oriented to person, place, and time.  Skin: Skin is warm and dry.  Psychiatric: He has a normal mood and affect. His behavior is normal. Judgment and thought content normal.    Data Reviewed U/S gallstones CBD normal  Assessment    Biliary colic.      Plan    Laparoscopic cholecystectomy.The procedure has been discussed with the patient. Operative and non operative treatments have been discussed. Risks of surgery include bleeding, infection,  Common bile duct injury,  Injury to the stomach,liver, colon,small intestine, abdominal wall,  Diaphragm,  Major blood vessels,  And the need for an open procedure.  Other risks include worsening of medical problems, death,  DVT and pulmonary embolism, and cardiovascular events.   Medical options have also been discussed. The patient has been informed of long term expectations of surgery and non surgical options,  The patient agrees to proceed.         [name] A. 06/25/2012, 4:54 PM

## 2012-06-29 ENCOUNTER — Ambulatory Visit (INDEPENDENT_AMBULATORY_CARE_PROVIDER_SITE_OTHER): Payer: BC Managed Care – PPO | Admitting: Surgery

## 2012-07-10 ENCOUNTER — Encounter (HOSPITAL_COMMUNITY): Payer: Self-pay | Admitting: Pharmacy Technician

## 2012-07-22 ENCOUNTER — Encounter (HOSPITAL_COMMUNITY): Payer: Self-pay

## 2012-07-22 ENCOUNTER — Encounter (HOSPITAL_COMMUNITY)
Admission: RE | Admit: 2012-07-22 | Discharge: 2012-07-22 | Disposition: A | Discharge status: Home / Self Care | Payer: BC Managed Care – PPO | Source: Ambulatory Visit | Attending: Surgery | Admitting: Surgery

## 2012-07-22 LAB — CBC
Hemoglobin: 16.1 g/dL (ref 13.0–17.0)
RBC: 5.13 MIL/uL (ref 4.22–5.81)

## 2012-07-22 LAB — COMPREHENSIVE METABOLIC PANEL
ALT: 34 U/L (ref 0–53)
Alkaline Phosphatase: 65 U/L (ref 39–117)
CO2: 31 mEq/L (ref 19–32)
Chloride: 101 mEq/L (ref 96–112)
GFR calc Af Amer: >90 mL/min (ref >90)
GFR calc non Af Amer: >90 mL/min (ref >90)
Glucose, Bld: 90 mg/dL (ref 70–99)
Potassium: 4.3 mEq/L (ref 3.5–5.1)
Sodium: 140 mEq/L (ref 135–145)

## 2012-07-22 LAB — DIFFERENTIAL
Basophils Absolute: 0 10*3/uL (ref 0.0–0.1)
Eosinophils Absolute: 0.1 10*3/uL (ref 0.0–0.7)
Eosinophils Relative: 1 % (ref 0–5)
Lymphs Abs: 2.9 10*3/uL (ref 0.7–4.0)

## 2012-07-22 LAB — SURGICAL PCR SCREEN: MRSA, PCR: NEGATIVE

## 2012-07-22 NOTE — Progress Notes (Signed)
EKG requested from Dr. Marvel Plan 's office.  Pt. Thinks he had Stress test At [facility],will try to request.

## 2012-07-22 NOTE — Pre-Procedure Instructions (Signed)
20 Kalmen Lollar  07/22/2012   Your procedure is scheduled on:  07-27-2012   Report to [facility] at 9:30 AM.  Call this number if you have problems the morning of surgery: [phone]   Remember:   Do not eat food or drink:After Midnight.     Take these medicines the morning of surgery with A SIP OF WATER: none   Do not wear jewelry, make-up or nail polish.  Do not wear lotions, powders, or perfumes. You may wear deodorant.  Do not shave 48 hours prior to surgery. Men may shave face and neck.  Do not bring valuables to the hospital.  Contacts, dentures or bridgework may not be worn into surgery.  Leave suitcase in the car. After surgery it may be brought to your room.  For patients admitted to the hospital, checkout time is 11:00 AM the day of discharge.   Patients discharged the day of surgery will not be allowed to drive home.  Name and phone number of your driver: _______________________________    Special Instructions: CHG Shower Use Special Wash: 1/2 bottle night before surgery and 1/2 bottle morning of surgery.   Please read over the following fact sheets that you were given: Pain Booklet, Coughing and Deep Breathing, MRSA Information and Surgical Site Infection Prevention

## 2012-07-26 MED ORDER — CEFAZOLIN SODIUM-DEXTROSE 2-3 GM-% IV SOLR
2.0000 g | INTRAVENOUS | Status: AC
  Administered 2012-07-27: 2 g via INTRAVENOUS
  Filled 2012-07-26: qty 50

## 2012-07-27 ENCOUNTER — Encounter (HOSPITAL_COMMUNITY): Payer: Self-pay | Admitting: Surgery

## 2012-07-27 ENCOUNTER — Ambulatory Visit (HOSPITAL_COMMUNITY): Payer: BC Managed Care – PPO

## 2012-07-27 ENCOUNTER — Encounter (HOSPITAL_COMMUNITY): Payer: Self-pay | Admitting: Anesthesiology

## 2012-07-27 ENCOUNTER — Ambulatory Visit (HOSPITAL_COMMUNITY)
Admission: RE | Admit: 2012-07-27 | Discharge: 2012-07-27 | Disposition: A | Discharge status: Home / Self Care | Payer: BC Managed Care – PPO | Source: Ambulatory Visit | Attending: Surgery | Admitting: Surgery

## 2012-07-27 ENCOUNTER — Encounter (HOSPITAL_COMMUNITY)
Admission: RE | Disposition: A | Discharge status: Home / Self Care | Payer: Self-pay | Source: Ambulatory Visit | Attending: Surgery

## 2012-07-27 ENCOUNTER — Ambulatory Visit (HOSPITAL_COMMUNITY): Payer: BC Managed Care – PPO | Admitting: Anesthesiology

## 2012-07-27 DIAGNOSIS — I10 Essential (primary) hypertension: Secondary | ICD-10-CM | POA: Insufficient documentation

## 2012-07-27 DIAGNOSIS — Z01818 Encounter for other preprocedural examination: Secondary | ICD-10-CM | POA: Insufficient documentation

## 2012-07-27 DIAGNOSIS — K8010 Calculus of gallbladder with chronic cholecystitis without obstruction: Secondary | ICD-10-CM

## 2012-07-27 DIAGNOSIS — K8020 Calculus of gallbladder without cholecystitis without obstruction: Secondary | ICD-10-CM

## 2012-07-27 DIAGNOSIS — Z01812 Encounter for preprocedural laboratory examination: Secondary | ICD-10-CM | POA: Insufficient documentation

## 2012-07-27 HISTORY — PX: CHOLECYSTECTOMY: SHX55

## 2012-07-27 SURGERY — LAPAROSCOPIC CHOLECYSTECTOMY WITH INTRAOPERATIVE CHOLANGIOGRAM
Anesthesia: General | Site: Abdomen | Wound class: Clean Contaminated

## 2012-07-27 MED ORDER — SODIUM CHLORIDE 0.9 % IR SOLN
Status: DC | PRN
  Administered 2012-07-27: 1000 mL

## 2012-07-27 MED ORDER — ONDANSETRON HCL 4 MG/2ML IJ SOLN: 4.0000 mg | Freq: Four times a day (QID) | INTRAMUSCULAR | Status: DC | PRN

## 2012-07-27 MED ORDER — SODIUM CHLORIDE 0.9 % IJ SOLN: 3.0000 mL | INTRAMUSCULAR | Status: DC | PRN

## 2012-07-27 MED ORDER — GLYCOPYRROLATE 0.2 MG/ML IJ SOLN
INTRAMUSCULAR | Status: DC | PRN
  Administered 2012-07-27: .8 mg via INTRAVENOUS

## 2012-07-27 MED ORDER — NEOSTIGMINE METHYLSULFATE 1 MG/ML IJ SOLN
INTRAMUSCULAR | Status: DC | PRN
  Administered 2012-07-27: 5 mg via INTRAVENOUS

## 2012-07-27 MED ORDER — ONDANSETRON HCL 4 MG/2ML IJ SOLN: 4.0000 mg | Freq: Once | INTRAMUSCULAR | Status: DC | PRN

## 2012-07-27 MED ORDER — ROCURONIUM BROMIDE 100 MG/10ML IV SOLN
INTRAVENOUS | Status: DC | PRN
  Administered 2012-07-27: 50 mg via INTRAVENOUS
  Administered 2012-07-27: 20 mg via INTRAVENOUS

## 2012-07-27 MED ORDER — SODIUM CHLORIDE 0.9 % IV SOLN
INTRAVENOUS | Status: DC | PRN
  Administered 2012-07-27: 13:00:00

## 2012-07-27 MED ORDER — OXYCODONE-ACETAMINOPHEN 5-325 MG PO TABS
1.0000 | ORAL_TABLET | ORAL | Status: DC | PRN
Start: 2012-07-27 — End: 2012-07-27

## 2012-07-27 MED ORDER — LIDOCAINE HCL (CARDIAC) 20 MG/ML IV SOLN
INTRAVENOUS | Status: DC | PRN
  Administered 2012-07-27: 80 mg via INTRAVENOUS

## 2012-07-27 MED ORDER — OXYCODONE HCL 5 MG PO TABS
ORAL_TABLET | ORAL | Status: AC
  Filled 2012-07-27: qty 1

## 2012-07-27 MED ORDER — HYDROMORPHONE HCL PF 1 MG/ML IJ SOLN
0.2500 mg | INTRAMUSCULAR | Status: DC | PRN
  Administered 2012-07-27 (×2): 0.5 mg via INTRAVENOUS

## 2012-07-27 MED ORDER — 0.9 % SODIUM CHLORIDE (POUR BTL) OPTIME
TOPICAL | Status: DC | PRN
  Administered 2012-07-27: 1000 mL

## 2012-07-27 MED ORDER — SODIUM CHLORIDE 0.9 % IJ SOLN: 3.0000 mL | Freq: Two times a day (BID) | INTRAMUSCULAR | Status: DC

## 2012-07-27 MED ORDER — PROPOFOL 10 MG/ML IV EMUL
INTRAVENOUS | Status: DC | PRN
  Administered 2012-07-27: 200 mg via INTRAVENOUS

## 2012-07-27 MED ORDER — ACETAMINOPHEN 325 MG PO TABS: 650.0000 mg | ORAL_TABLET | ORAL | Status: DC | PRN

## 2012-07-27 MED ORDER — LACTATED RINGERS IV SOLN
INTRAVENOUS | Status: DC | PRN
  Administered 2012-07-27 (×2): via INTRAVENOUS

## 2012-07-27 MED ORDER — OXYCODONE-ACETAMINOPHEN 5-325 MG PO TABS: 1.0000 | ORAL_TABLET | ORAL | Status: AC | PRN

## 2012-07-27 MED ORDER — MIDAZOLAM HCL 5 MG/5ML IJ SOLN
INTRAMUSCULAR | Status: DC | PRN
  Administered 2012-07-27: 2 mg via INTRAVENOUS

## 2012-07-27 MED ORDER — HEMOSTATIC AGENTS (NO CHARGE) OPTIME
TOPICAL | Status: DC | PRN
  Administered 2012-07-27: 1 via TOPICAL

## 2012-07-27 MED ORDER — OXYCODONE HCL 5 MG PO TABS
5.0000 mg | ORAL_TABLET | ORAL | Status: DC | PRN
  Administered 2012-07-27: 5 mg via ORAL

## 2012-07-27 MED ORDER — BUPIVACAINE-EPINEPHRINE PF 0.25-1:200000 % IJ SOLN
INTRAMUSCULAR | Status: AC
  Filled 2012-07-27: qty 30

## 2012-07-27 MED ORDER — LACTATED RINGERS IV SOLN
INTRAVENOUS | Status: DC
  Administered 2012-07-27: 11:00:00 via INTRAVENOUS

## 2012-07-27 MED ORDER — HYDROMORPHONE HCL PF 1 MG/ML IJ SOLN
INTRAMUSCULAR | Status: AC
  Filled 2012-07-27: qty 1

## 2012-07-27 MED ORDER — MORPHINE SULFATE 2 MG/ML IJ SOLN: 1.0000 mg | INTRAMUSCULAR | Status: DC | PRN

## 2012-07-27 MED ORDER — FENTANYL CITRATE 0.05 MG/ML IJ SOLN
INTRAMUSCULAR | Status: DC | PRN
  Administered 2012-07-27 (×2): 50 ug via INTRAVENOUS
  Administered 2012-07-27: 100 ug via INTRAVENOUS
  Administered 2012-07-27: 50 ug via INTRAVENOUS

## 2012-07-27 MED ORDER — BUPIVACAINE-EPINEPHRINE 0.25% -1:200000 IJ SOLN
INTRAMUSCULAR | Status: DC | PRN
  Administered 2012-07-27: 8 mL

## 2012-07-27 MED ORDER — SODIUM CHLORIDE 0.9 % IV SOLN: 250.0000 mL | INTRAVENOUS | Status: DC | PRN

## 2012-07-27 MED ORDER — ONDANSETRON HCL 4 MG/2ML IJ SOLN
INTRAMUSCULAR | Status: DC | PRN
  Administered 2012-07-27: 4 mg via INTRAVENOUS

## 2012-07-27 MED ORDER — ACETAMINOPHEN 650 MG RE SUPP: 650.0000 mg | RECTAL | Status: DC | PRN

## 2012-07-27 SURGICAL SUPPLY — 45 items
APPLIER CLIP ROT 10 11.4 M/L (STAPLE) ×2
BLADE SURG ROTATE 9660 (MISCELLANEOUS) ×2 IMPLANT
CANISTER SUCTION 2500CC (MISCELLANEOUS) ×2 IMPLANT
CHLORAPREP W/TINT 26ML (MISCELLANEOUS) ×2 IMPLANT
CLIP APPLIE ROT 10 11.4 M/L (STAPLE) ×1 IMPLANT
CLOTH BEACON ORANGE TIMEOUT ST (SAFETY) ×2 IMPLANT
COVER MAYO STAND STRL (DRAPES) ×2 IMPLANT
COVER SURGICAL LIGHT HANDLE (MISCELLANEOUS) ×2 IMPLANT
DECANTER SPIKE VIAL GLASS SM (MISCELLANEOUS) IMPLANT
DERMABOND ADVANCED (GAUZE/BANDAGES/DRESSINGS) ×1
DERMABOND ADVANCED .7 DNX12 (GAUZE/BANDAGES/DRESSINGS) ×1 IMPLANT
DRAPE C-ARM 42X72 X-RAY (DRAPES) ×2 IMPLANT
DRAPE UTILITY 15X26 W/TAPE STR (DRAPE) ×4 IMPLANT
DRAPE WARM FLUID 44X44 (DRAPE) ×2 IMPLANT
ELECT REM PT RETURN 9FT ADLT (ELECTROSURGICAL) ×2
ELECTRODE REM PT RTRN 9FT ADLT (ELECTROSURGICAL) ×1 IMPLANT
GLOVE BIO SURGEON STRL SZ7 (GLOVE) ×4 IMPLANT
GLOVE BIO SURGEON STRL SZ7.5 (GLOVE) ×2 IMPLANT
GLOVE BIO SURGEON STRL SZ8 (GLOVE) ×2 IMPLANT
GLOVE BIOGEL PI IND STRL 7.0 (GLOVE) ×1 IMPLANT
GLOVE BIOGEL PI IND STRL 7.5 (GLOVE) ×2 IMPLANT
GLOVE BIOGEL PI IND STRL 8 (GLOVE) ×1 IMPLANT
GLOVE BIOGEL PI INDICATOR 7.0 (GLOVE) ×1
GLOVE BIOGEL PI INDICATOR 7.5 (GLOVE) ×2
GLOVE BIOGEL PI INDICATOR 8 (GLOVE) ×1
GOWN STRL NON-REIN LRG LVL3 (GOWN DISPOSABLE) ×8 IMPLANT
HEMOSTAT SNOW SURGICEL 2X4 (HEMOSTASIS) ×2 IMPLANT
KIT BASIN OR (CUSTOM PROCEDURE TRAY) ×2 IMPLANT
KIT ROOM TURNOVER OR (KITS) ×2 IMPLANT
NS IRRIG 1000ML POUR BTL (IV SOLUTION) ×2 IMPLANT
PAD ARMBOARD 7.5X6 YLW CONV (MISCELLANEOUS) ×2 IMPLANT
POUCH SPECIMEN RETRIEVAL 10MM (ENDOMECHANICALS) ×2 IMPLANT
SCISSORS LAP 5X35 DISP (ENDOMECHANICALS) IMPLANT
SET CHOLANGIOGRAPH 5 50 .035 (SET/KITS/TRAYS/PACK) ×2 IMPLANT
SET IRRIG TUBING LAPAROSCOPIC (IRRIGATION / IRRIGATOR) ×2 IMPLANT
SLEEVE ENDOPATH XCEL 5M (ENDOMECHANICALS) ×2 IMPLANT
SPECIMEN JAR SMALL (MISCELLANEOUS) ×2 IMPLANT
SUT MNCRL AB 4-0 PS2 18 (SUTURE) ×2 IMPLANT
SUT VICRYL 0 UR6 27IN ABS (SUTURE) ×2 IMPLANT
TOWEL OR 17X24 6PK STRL BLUE (TOWEL DISPOSABLE) ×2 IMPLANT
TOWEL OR 17X26 10 PK STRL BLUE (TOWEL DISPOSABLE) ×2 IMPLANT
TRAY LAPAROSCOPIC (CUSTOM PROCEDURE TRAY) ×2 IMPLANT
TROCAR XCEL BLUNT TIP 100MML (ENDOMECHANICALS) ×2 IMPLANT
TROCAR XCEL NON-BLD 11X100MML (ENDOMECHANICALS) ×2 IMPLANT
TROCAR XCEL NON-BLD 5MMX100MML (ENDOMECHANICALS) ×2 IMPLANT

## 2012-07-27 NOTE — Interval H&P Note (Signed)
History and Physical Interval Note:  07/27/2012 11:17 AM  Aaron Burns  has presented today for surgery, with the diagnosis of gallstones  The various methods of treatment have been discussed with the patient and family. After consideration of risks, benefits and other options for treatment, the patient has consented to  Procedure(s) (LRB): LAPAROSCOPIC CHOLECYSTECTOMY WITH INTRAOPERATIVE CHOLANGIOGRAM (N/A) as a surgical intervention .  The patient's history has been reviewed, patient examined, no change in status, stable for surgery.  I have reviewed the patient's chart and labs.  Questions were answered to the patient's satisfaction.     Wilhelm Ganaway A.

## 2012-07-27 NOTE — Addendum Note (Signed)
Addendum  created 07/27/12 1408 by Timmi Devora M Weaver, CRNA   Modules edited:Anesthesia Medication Administration    

## 2012-07-27 NOTE — Anesthesia Preprocedure Evaluation (Signed)
Anesthesia Evaluation  Patient identified by MRN, date of birth, ID band Patient awake    Reviewed: Allergy & Precautions, H&P , NPO status , Patient's Chart, lab work & pertinent test results  Airway Mallampati: I TM Distance: >3 FB Neck ROM: Full    Dental  (+) Dental Advisory Given and Teeth Intact   Pulmonary  breath sounds clear to auscultation        Cardiovascular hypertension, Rhythm:Regular Rate:Normal     Neuro/Psych    GI/Hepatic   Endo/Other  Morbid obesity  Renal/GU      Musculoskeletal   Abdominal   Peds  Hematology   Anesthesia Other Findings   Reproductive/Obstetrics                           Anesthesia Physical Anesthesia Plan  ASA: II  Anesthesia Plan: General   Post-op Pain Management:    Induction: Intravenous  Airway Management Planned: Oral ETT  Additional Equipment:   Intra-op Plan:   Post-operative Plan: Extubation in OR  Informed Consent: I have reviewed the patients History and Physical, chart, labs and discussed the procedure including the risks, benefits and alternatives for the proposed anesthesia with the patient or authorized representative who has indicated his/her understanding and acceptance.   Dental advisory given  Plan Discussed with: CRNA, Anesthesiologist and Surgeon  Anesthesia Plan Comments:         Anesthesia Quick Evaluation

## 2012-07-27 NOTE — Op Note (Signed)
Laparoscopic Cholecystectomy with IOC Procedure Note  Indications: This patient presents with symptomatic gallbladder disease and will undergo laparoscopic cholecystectomy.  Pre-operative Diagnosis: Calculus of gallbladder without mention of cholecystitis or obstruction  Post-operative Diagnosis: Same  Surgeon: Corrin Sieling A.   Assistants: OR  Anesthesia: General endotracheal anesthesia  ASA Class: 3  Procedure Details  The patient was seen again in the Holding Room. The risks, benefits, complications, treatment options, and expected outcomes were discussed with the patient. The possibilities of reaction to medication, pulmonary aspiration, perforation of viscus, bleeding, recurrent infection, finding a normal gallbladder, the need for additional procedures, failure to diagnose a condition, the possible need to convert to an open procedure, and creating a complication requiring transfusion or operation were discussed with the patient. The patient and/or family concurred with the proposed plan, giving informed consent. The site of surgery properly noted/marked. The patient was taken to Operating Room, identified as Aaron Burns and the procedure verified as Laparoscopic Cholecystectomy with Intraoperative Cholangiograms. A Time Out was held and the above information confirmed.  Prior to the induction of general anesthesia, antibiotic prophylaxis was administered. General endotracheal anesthesia was then administered and tolerated well. After the induction, the abdomen was prepped in the usual sterile fashion. The patient was positioned in the supine position with the left arm comfortably tucked, along with some reverse Trendelenburg.  Local anesthetic agent was injected into the skin near the umbilicus and an incision made. The midline fascia was incised and the Hasson technique was used to introduce a 10 mm port under direct vision. It was secured with a figure of eight Vicryl suture placed in  the usual fashion. Pneumoperitoneum was then created with CO2 and tolerated well without any adverse changes in the patient's vital signs. Additional trocars were introduced under direct vision. All skin incisions were infiltrated with a local anesthetic agent before making the incision and placing the trocars.   The gallbladder was identified, the fundus grasped and retracted cephalad. Adhesions were lysed bluntly and with the electrocautery where indicated, taking care not to injure any adjacent organs or viscus. The infundibulum was grasped and retracted laterally, exposing the peritoneum overlying the triangle of Calot. This was then divided and exposed in a blunt fashion. The cystic duct was clearly identified and bluntly dissected circumferentially. The junctions of the gallbladder, cystic duct and common bile duct were clearly identified prior to the division of any linear structure.   An incision was made in the cystic duct and the cholangiogram catheter introduced. The catheter was secured using an endoclip. The study showed no stones and good visualization of the distal and proximal biliary tree. The catheter was then removed.   The cystic duct was then  ligated with surgical clips  on the patient side and  clipped on the gallbladder side and divided. The cystic artery was identified, dissected free, ligated with clips and divided as well. Posterior cystic artery clipped and divided.  The gallbladder was dissected from the liver bed in retrograde fashion with the electrocautery. The gallbladder was removed. The liver bed was irrigated and inspected. Hemostasis was achieved with the electrocautery. Copious irrigation was utilized and was repeatedly aspirated until clear all particulate matter.  Pneumoperitoneum was completely reduced after viewing removal of the trocars under direct vision. The wound was thoroughly irrigated and the fascia was then closed with a figure of eight suture; the skin  was then closed with 4 O and a Dermabond  was applied.  Instrument, sponge, and needle counts  were correct at closure and at the conclusion of the case.   Findings: Cholecystitis with Cholelithiasis  Estimated Blood Loss: less than 50 mL         Drains: NONE         Total IV Fluids: 1200 mL         Specimens: Gallbladder           Complications: None; patient tolerated the procedure well.         Disposition: PACU - hemodynamically stable.         Condition: stable

## 2012-07-27 NOTE — Anesthesia Procedure Notes (Signed)
Procedure Name: Intubation Date/Time: 07/27/2012 11:56 AM Performed by: Margaree Mackintosh Pre-anesthesia Checklist: Patient identified, Timeout performed, Emergency Drugs available, Suction available and Patient being monitored Patient Re-evaluated:Patient Re-evaluated prior to inductionOxygen Delivery Method: Circle system utilized Preoxygenation: Pre-oxygenation with 100% oxygen Intubation Type: IV induction Ventilation: Mask ventilation without difficulty Laryngoscope Size: Mac and 4 Grade View: Grade I Tube type: Oral Tube size: 7.5 mm Number of attempts: 1 Airway Equipment and Method: Stylet Placement Confirmation: ETT inserted through vocal cords under direct vision,  positive ETCO2 and breath sounds checked- equal and bilateral Secured at: 22 cm Tube secured with: Tape Dental Injury: Teeth and Oropharynx as per pre-operative assessment

## 2012-07-27 NOTE — Preoperative (Signed)
Beta Blockers   Reason not to administer Beta Blockers:Not Applicable2 

## 2012-07-27 NOTE — Addendum Note (Signed)
Addendum  created 07/27/12 1408 by Ellin Goodie, CRNA   Modules edited:Anesthesia Medication Administration

## 2012-07-27 NOTE — Anesthesia Postprocedure Evaluation (Signed)
  Anesthesia Post-op Note  Patient: Aaron Burns  Procedure(s) Performed: Procedure(s) (LRB): LAPAROSCOPIC CHOLECYSTECTOMY WITH INTRAOPERATIVE CHOLANGIOGRAM (N/A)  Patient Location: PACU  Anesthesia Type: General  Level of Consciousness: awake, alert , oriented and patient cooperative  Airway and Oxygen Therapy: Patient Spontanous Breathing and Patient connected to nasal cannula oxygen  Post-op Pain: mild  Post-op Assessment: Post-op Vital signs reviewed, Patient's Cardiovascular Status Stable, Respiratory Function Stable, Patent Airway, No signs of Nausea or vomiting and Pain level controlled  Post-op Vital Signs: stable  Complications: No apparent anesthesia complications

## 2012-07-27 NOTE — Transfer of Care (Signed)
Immediate Anesthesia Transfer of Care Note  Patient: Aaron Burns  Procedure(s) Performed: Procedure(s) (LRB): LAPAROSCOPIC CHOLECYSTECTOMY WITH INTRAOPERATIVE CHOLANGIOGRAM (N/A)  Patient Location: PACU  Anesthesia Type: General  Level of Consciousness: awake, alert  and oriented  Airway & Oxygen Therapy: Patient Spontanous Breathing  Post-op Assessment: Report given to PACU RN  Post vital signs: stable  Complications: No apparent anesthesia complications

## 2012-07-27 NOTE — H&P (Addendum)
Aaron Burns   MRN: [id number]   Description: 56 year old male  Provider: Dortha Schwalbe., MD  Department: [facility]        Diagnoses     Gallstones   - Primary    574.20      Reason for Visit     Follow-up    reck GB        Vitals - Last Recorded       BP Pulse Temp Resp Ht Wt    142/90 76 98.4 F (36.9 C) (Temporal) 18 5\' 9"  (1.753 m) 245 lb 6.4 oz (111.313 kg)         BMI              36.24 kg/m2                 Progress Notes   Patient ID: Aaron Burns, male   DOB: [birth year], 56 y.o.   MRN: [id number]    Chief Complaint   Patient presents with   .  Follow-up       reck GB      HPI Aaron Burns is a 57 y.o. male.   HPIPatient presents at the request of Dr. Annabell Burns of urology due to right lower back pain. He was evaluated by Dr. Annabell Burns and by CT which showed a large gallstone but no kidney stone. He has a two-week history of constant right lower back pain. Eating does not make it better or worse. Denies nausea or vomiting. The pain seems to be more along his right flank and right side now is constant in nature. Nothing seems to make it better or worse. He does have more pain when he twists or movements. U/S was done which shows multiple gallstones without cholecystitis.  CBD  Normal caliber    Past Medical History   Diagnosis  Date   .  Hypertension     .  Upper respiratory infection  2012       recently tx-no fever now-clearing up   .  Chronic kidney disease  5/11       kidney stone   .  Flu  11/17/11       pt. states went to urgent care, had flu, took tamiflu, felt better in 2 days.   [name]  Heart murmur         at birth       Past Surgical History   Procedure  Date   .  Hernia repair         as a baby   .  Cystoscopy/retrograde/ureteroscopy/stone extraction with basket  5/11   .  Wisdom tooth extraction     .  Knee surgery  11/25/11       Family History   Problem  Relation  Age of Onset   .  Hypertension  Mother        Social  History History   Substance Use Topics   .  Smoking status:  Current Some Day Smoker -- 0.2 packs/day       Types:  Cigarettes   .  Smokeless tobacco:  Not on file     Comment: 2-3 cigs a day   .  Alcohol Use:  Yes         occasional      No Known Allergies    Current Outpatient Prescriptions   Medication  Sig  Dispense  Refill   .  Ascorbic Acid (VITAMIN C) 1000 MG tablet  Take 1,000 mg by mouth daily.           .  chlorpheniramine-HYDROcodone (TUSSIONEX) 10-8 MG/5ML LQCR  Take 5 mLs by mouth every 6 (six) hours as needed.           [name]  co-enzyme Q-10 30 MG capsule  Take 30 mg by mouth 3 (three) times daily.           .  fish oil-omega-3 fatty acids 1000 MG capsule  Take 2 g by mouth daily.           .  hydrochlorothiazide (HYDRODIURIL) 25 MG tablet  Take 25 mg by mouth daily.           [name]  lisinopril (PRINIVIL,ZESTRIL) 10 MG tablet  Take 10 mg by mouth daily.           .  meloxicam (MOBIC) 15 MG tablet  Take 15 mg by mouth daily.         .  Multiple Vitamin (MULTIVITAMIN) capsule  Take 1 capsule by mouth daily.           .  nicotine polacrilex (NICORETTE) 2 MG gum  Take 2 mg by mouth as needed.              Review of Systems Review of Systems  Constitutional: Negative for fever, chills and unexpected weight change.  HENT: Negative for hearing loss, congestion, sore throat, trouble swallowing and voice change.   Eyes: Negative for visual disturbance.  Respiratory: Negative for cough and wheezing.   Cardiovascular: Negative for chest pain, palpitations and leg swelling.  Gastrointestinal: Negative for nausea, vomiting, abdominal pain, diarrhea, constipation, blood in stool, abdominal distention, anal bleeding and rectal pain.  Genitourinary: Negative for hematuria and difficulty urinating.  Musculoskeletal: Negative for arthralgias.  Skin: Negative for rash and wound.  Neurological: Negative for seizures, syncope, weakness and headaches.  Hematological: Negative for adenopathy.  Does not bruise/bleed easily.  Psychiatric/Behavioral: Negative for confusion.      Blood pressure 142/90, pulse 76, temperature 98.4 F (36.9 C), temperature source Temporal, resp. rate 18, height 5\' 9"  (1.753 m), weight 245 lb 6.4 oz (111.313 kg).   Physical Exam Physical Exam  Constitutional: He is oriented to person, place, and time. He appears well-developed and well-nourished.  HENT:   Head: Normocephalic and atraumatic.  Eyes: EOM are normal. Pupils are equal, round, and reactive to light.  Neck: Normal range of motion. Neck supple.  Cardiovascular: Normal rate and regular rhythm.   Pulmonary/Chest: Effort normal and breath sounds normal.  Abdominal: Soft. Bowel sounds are normal. There is no tenderness. There is no rebound.  Musculoskeletal:       Arms: Neurological: He is alert and oriented to person, place, and time.  Skin: Skin is warm and dry.  Psychiatric: He has a normal mood and affect. His behavior is normal. Judgment and thought content normal.      Data Reviewed U/S gallstones CBD normal   Assessment Biliary colic.     Plan Laparoscopic cholecystectomy.The procedure has been discussed with the patient. Operative and non operative treatments have been discussed. Risks of surgery include bleeding, infection,  Common bile duct injury,  Injury to the stomach,liver, colon,small intestine, abdominal wall,  Diaphragm,  Major blood vessels,  And the need for an open procedure.  Other risks include worsening of medical problems, death,  DVT and pulmonary embolism, and cardiovascular events.   Medical options have also been discussed. The patient has been informed  of long term expectations of surgery and non surgical options,  The patient agrees to proceed.         Aaron Burns A. 07/27/2012

## 2012-07-28 ENCOUNTER — Encounter (HOSPITAL_COMMUNITY): Payer: Self-pay | Admitting: Surgery

## 2012-08-14 ENCOUNTER — Encounter (INDEPENDENT_AMBULATORY_CARE_PROVIDER_SITE_OTHER): Payer: Self-pay | Admitting: Surgery

## 2012-08-14 ENCOUNTER — Ambulatory Visit (INDEPENDENT_AMBULATORY_CARE_PROVIDER_SITE_OTHER): Payer: BC Managed Care – PPO | Admitting: Surgery

## 2012-08-14 VITALS — BP 134/78 | HR 70 | Temp 97.1°F | Resp 16 | Ht 69.0 in | Wt 248.0 lb

## 2012-08-14 DIAGNOSIS — Z9889 Other specified postprocedural states: Secondary | ICD-10-CM

## 2012-08-14 NOTE — Patient Instructions (Signed)
Return as needed

## 2012-08-14 NOTE — Progress Notes (Signed)
He is here for a postop visit following laparoscopic cholecystectomy.  Diet is being tolerated, bowels are moving.  No problems with incisions.  PE:  ABD:  Soft, incisions clean/dry/intact and solid. Cholangiogram  Normal Path chronic cholecysttis with stone  Assessment:  Doing well postop.  Plan:  Lowfat diet recommended.  Activities as tolerated.  Return visit prn.

## 2012-11-23 ENCOUNTER — Other Ambulatory Visit (HOSPITAL_COMMUNITY): Payer: Self-pay | Admitting: Internal Medicine

## 2012-11-23 ENCOUNTER — Ambulatory Visit (HOSPITAL_COMMUNITY)
Admission: RE | Admit: 2012-11-23 | Discharge: 2012-11-23 | Disposition: A | Discharge status: Home / Self Care | Payer: BC Managed Care – PPO | Source: Ambulatory Visit | Attending: Internal Medicine | Admitting: Internal Medicine

## 2012-11-23 DIAGNOSIS — A150 Tuberculosis of lung: Secondary | ICD-10-CM | POA: Insufficient documentation

## 2012-11-23 DIAGNOSIS — I10 Essential (primary) hypertension: Secondary | ICD-10-CM | POA: Insufficient documentation

## 2012-11-23 DIAGNOSIS — F17200 Nicotine dependence, unspecified, uncomplicated: Secondary | ICD-10-CM | POA: Insufficient documentation

## 2013-04-07 DIAGNOSIS — R7611 Nonspecific reaction to tuberculin skin test without active tuberculosis: Secondary | ICD-10-CM

## 2013-04-07 HISTORY — DX: Nonspecific reaction to tuberculin skin test without active tuberculosis: R76.11

## 2013-05-25 ENCOUNTER — Encounter (HOSPITAL_COMMUNITY): Payer: Self-pay | Admitting: Adult Health

## 2013-05-25 ENCOUNTER — Emergency Department (HOSPITAL_COMMUNITY)
Admission: EM | Admit: 2013-05-25 | Discharge: 2013-05-25 | Disposition: A | Discharge status: Home / Self Care | Payer: Worker's Compensation | Attending: Emergency Medicine | Admitting: Emergency Medicine

## 2013-05-25 DIAGNOSIS — Y998 Other external cause status: Secondary | ICD-10-CM | POA: Insufficient documentation

## 2013-05-25 DIAGNOSIS — Y929 Unspecified place or not applicable: Secondary | ICD-10-CM | POA: Insufficient documentation

## 2013-05-25 DIAGNOSIS — S058X9A Other injuries of unspecified eye and orbit, initial encounter: Secondary | ICD-10-CM | POA: Insufficient documentation

## 2013-05-25 DIAGNOSIS — T1590XA Foreign body on external eye, part unspecified, unspecified eye, initial encounter: Secondary | ICD-10-CM | POA: Insufficient documentation

## 2013-05-25 DIAGNOSIS — Z79899 Other long term (current) drug therapy: Secondary | ICD-10-CM | POA: Insufficient documentation

## 2013-05-25 DIAGNOSIS — S0502XA Injury of conjunctiva and corneal abrasion without foreign body, left eye, initial encounter: Secondary | ICD-10-CM

## 2013-05-25 DIAGNOSIS — R011 Cardiac murmur, unspecified: Secondary | ICD-10-CM | POA: Insufficient documentation

## 2013-05-25 DIAGNOSIS — N189 Chronic kidney disease, unspecified: Secondary | ICD-10-CM | POA: Insufficient documentation

## 2013-05-25 DIAGNOSIS — I129 Hypertensive chronic kidney disease with stage 1 through stage 4 chronic kidney disease, or unspecified chronic kidney disease: Secondary | ICD-10-CM | POA: Insufficient documentation

## 2013-05-25 DIAGNOSIS — F17200 Nicotine dependence, unspecified, uncomplicated: Secondary | ICD-10-CM | POA: Insufficient documentation

## 2013-05-25 MED ORDER — TETRACAINE HCL 0.5 % OP SOLN
OPHTHALMIC | Status: AC
  Filled 2013-05-25: qty 2

## 2013-05-25 MED ORDER — TETRACAINE HCL 0.5 % OP SOLN
2.0000 [drp] | Freq: Once | OPHTHALMIC | Status: AC
  Administered 2013-05-25: 2 [drp] via OPHTHALMIC

## 2013-05-25 MED ORDER — TETANUS-DIPHTH-ACELL PERTUSSIS 5-2.5-18.5 LF-MCG/0.5 IM SUSP
0.5000 mL | Freq: Once | INTRAMUSCULAR | Status: DC
  Filled 2013-05-25: qty 0.5

## 2013-05-25 MED ORDER — POLYMYXIN B-TRIMETHOPRIM 10000-0.1 UNIT/ML-% OP SOLN: 1.0000 [drp] | OPHTHALMIC | Status: DC

## 2013-05-25 MED ORDER — HYDROCODONE-ACETAMINOPHEN 5-325 MG PO TABS: 2.0000 | ORAL_TABLET | Freq: Four times a day (QID) | ORAL | Status: DC | PRN

## 2013-05-25 NOTE — ED Provider Notes (Signed)
Medical screening examination/treatment/procedure(s) were performed by non-physician practitioner and as supervising physician I was immediately available for consultation/collaboration.   Jovahn Breit, MD 05/25/13 2356 

## 2013-05-25 NOTE — ED Notes (Signed)
Pt sts he doesn't have glasses with him that he normally uses.

## 2013-05-25 NOTE — ED Provider Notes (Signed)
History     CSN: [id number]  Arrival date & time 05/25/13  2014   First MD Initiated Contact with Patient 05/25/13 2125      Chief Complaint  Patient presents with  . Eye Pain    (Consider location/radiation/quality/duration/timing/severity/associated sxs/prior treatment) HPI Comments: Patient with chief complaint of left eye pain. States it began around 7:00 this evening. He feels like he is foreign body in his eye. He does not recall getting anything in his eye, but states that he works around a lot of dust and that's one of the fans might have blown something in his eye. He states that he is copiously irrigated his eye, but still feels like there is foreign body in his eye. He does not wear contacts or glasses. States that the pain is moderate to severe, but has improved since receiving tetracaine in triage.  The history is provided by the patient. No language interpreter was used.    Past Medical History  Diagnosis Date  . Upper respiratory infection 2012    recently tx-no fever now-clearing up  . Flu 11/17/11    pt. states went to urgent care, had flu, took tamiflu, felt better in 2 days.  . Hypertension   . Heart murmur     at birth  . Chronic kidney disease 5/11    kidney stone    Past Surgical History  Procedure Laterality Date  . Hernia repair      as a baby  . Cystoscopy/retrograde/ureteroscopy/stone extraction with basket  5/11  . Wisdom tooth extraction    . Knee surgery  11/25/11  . Cholecystectomy  07/27/2012    Procedure: LAPAROSCOPIC CHOLECYSTECTOMY WITH INTRAOPERATIVE CHOLANGIOGRAM;  Surgeon: Clovis Pu. Cornett, MD;  Location: [facility] OR;  Service: General;  Laterality: N/A;    Family History  Problem Relation Age of Onset  . Hypertension Mother     History  Substance Use Topics  . Smoking status: Current Some Day Smoker -- 0.25 packs/day for 30 years    Types: Cigarettes  . Smokeless tobacco: Never Used     Comment: 2-3 cigs a day  . Alcohol Use: No     Comment: occasional      Review of Systems  All other systems reviewed and are negative.    Allergies  Review of patient's allergies indicates no known allergies.  Home Medications   Current Outpatient Rx  Name  Route  Sig  Dispense  Refill  . CINNAMON PO   Oral   Take 1,000 mg by mouth daily.         . hydrochlorothiazide (HYDRODIURIL) 25 MG tablet   Oral   Take 25 mg by mouth daily.           [name] lisinopril (PRINIVIL,ZESTRIL) 10 MG tablet   Oral   Take 10 mg by mouth daily.           . Multiple Vitamin (MULTIVITAMIN WITH MINERALS) TABS   Oral   Take 1 tablet by mouth daily.         [name] OVER THE COUNTER MEDICATION   Oral   Take 1 tablet by mouth daily. Magnesium with zinc         . OVER THE COUNTER MEDICATION   Oral   Take 1 tablet by mouth daily. Balance B50         . phentermine 37.5 MG capsule   Oral   Take 37.5 mg by mouth every morning.         [name]  vitamin C (ASCORBIC ACID) 500 MG tablet   Oral   Take 1,000 mg by mouth daily.         [name] HYDROcodone-acetaminophen (NORCO/VICODIN) 5-325 MG per tablet   Oral   Take 2 tablets by mouth every 6 (six) hours as needed for pain.   15 tablet   0   . trimethoprim-polymyxin b (POLYTRIM) ophthalmic solution   Left Eye   Place 1 drop into the left eye every 4 (four) hours.   10 mL   0     BP 143/78  Pulse 86  Temp(Src) 98.2 F (36.8 C) (Oral)  Resp 16  SpO2 98%  Physical Exam  Nursing note and vitals reviewed. Constitutional: He is oriented to person, place, and time. He appears well-developed and well-nourished.  HENT:  Head: Normocephalic and atraumatic.  No foreign bodies visualized on direct examination or on slit-lamp examination, eyelid was everted, fluorescein staining revealed mild inferior uptake at approximately 7:00, extraocular eye motion intact  Eyes: Conjunctivae and EOM are normal.    Neck: Normal range of motion.  Cardiovascular: Normal rate.   Pulmonary/Chest: Effort  normal.  Abdominal: He exhibits no distension.  Musculoskeletal: Normal range of motion.  Neurological: He is alert and oriented to person, place, and time.  Skin: Skin is dry.  Psychiatric: He has a normal mood and affect. His behavior is normal. Judgment and thought content normal.    ED Course  Procedures (including critical care time)  Labs Reviewed - No data to display No results found.   1. Corneal abrasion, left, initial encounter       MDM  Patient with corneal abrasion. No foreign bodies are visualized on slit-lamp examination or on tract visualization. Fluorescein staining reveals small area of corneal abrasion. Will treat the patient with pain medicine and Polytrim eyedrops. Followup with ophthalmology for new or worsening symptoms. Patient understands and agrees with the plan. He is stable and ready for discharge per        Roxy Horseman, PA-C 05/25/13 2249

## 2013-05-25 NOTE — ED Notes (Signed)
Presents with eye pain that began at 7 pm today. Pt reports "it feels like there something in my left eye. It feels like a need. I used a whole bottle of eye wash." FB not visualized. Left eye tred and tearing.

## 2013-10-19 ENCOUNTER — Telehealth: Payer: Self-pay | Admitting: Internal Medicine

## 2013-10-19 NOTE — Telephone Encounter (Signed)
Spoke with Shanda Bumps at [facility] [address] [phone].  Advised Rx Refill for Phentermine 37.5mg  must be D/C per Loree Fee, PA-C orders.  Pt was advised at last office visit to wean off Rx d/t has been taking Rx for longer then 3 months.  Also called Pt.  To advise Rx will not be refilled at this time d/y above reasons, per Loree Fee, PA-C.

## 2013-10-19 NOTE — Telephone Encounter (Signed)
REFILL FROM [facility] HCL 37.5MG  TAB

## 2013-11-27 ENCOUNTER — Encounter: Payer: Self-pay | Admitting: Internal Medicine

## 2013-11-27 DIAGNOSIS — I10 Essential (primary) hypertension: Secondary | ICD-10-CM

## 2013-11-27 DIAGNOSIS — E119 Type 2 diabetes mellitus without complications: Secondary | ICD-10-CM | POA: Insufficient documentation

## 2013-11-30 DIAGNOSIS — E559 Vitamin D deficiency, unspecified: Secondary | ICD-10-CM | POA: Insufficient documentation

## 2013-11-30 DIAGNOSIS — E782 Mixed hyperlipidemia: Secondary | ICD-10-CM | POA: Insufficient documentation

## 2013-11-30 NOTE — Progress Notes (Signed)
Patient ID: Aaron Burns, male   DOB: 18-Apr-1956, 57 y.o.   MRN: [id number]  Annual Screening Comprehensive Examination  This very nice  57 yo MWM  presents for complete physical.  Patient has been followed for HTN, Prediabetes & Morbid  Obesity, Hyperlipidemia, and Vitamin D Deficiency.   Patient's BP has been controlled at home. Today's BP is 116/78. Patient denies any cardiac symptoms as chest pain, palpitations, shortness of breath, dizziness or ankle swelling.   Patient's hyperlipidemia is controlled with diet and medications. Patient denies myalgias or other medication SE's. Last cholesterol last visit was 125, triglycerides 136, HDL 32 and LDL 66  In September - all at goal.     Patient has prediabetes/insulin resistance with last A1c 6.0% in September (had been 6.6% in past). Patient denies reactive hypoglycemic symptoms, visual blurring, diabetic polys, or paresthesias.    Patient has Morbid obesity and had been very successful losing weight with phentermine. When he stopped the phentermine, he relapsed and began gaining weight again.   Finally, patient has history of Vitamin D Deficiency with last vitamin D 116 and accordingly reduced his dose.     Medication Sig Dispense Refill  . Cholecalciferol (VITAMIN D3 MAXIMUM STRENGTH) 5000 UNITS capsule Take 10,000 Units by mouth daily.       [name] CINNAMON PO Take 1,000 mg by mouth daily.      . hydrochlorothiazide (HYDRODIURIL) 25 MG tablet Take 25 mg by mouth daily.        [name] HYDROcodone-acetaminophen (NORCO/VICODIN) 5-325 MG per tablet Take 2 tablets by mouth every 6 (six) hours as needed for pain.  15 tablet  0  . lisinopril (PRINIVIL,ZESTRIL) 10 MG tablet Take 10 mg by mouth daily.        . Multiple Vitamin (MULTIVITAMIN WITH MINERALS) TABS Take 1 tablet by mouth daily.      [name] OVER THE COUNTER MEDICATION Take 1 tablet by mouth daily. Magnesium with zinc      . OVER THE COUNTER MEDICATION Take 1 tablet by mouth daily. Balance B50      .  trimethoprim-polymyxin b (POLYTRIM) ophthalmic solution Place 1 drop into the left eye every 4 (four) hours.  10 mL  0  . vitamin C (ASCORBIC ACID) 500 MG tablet Take 1,000 mg by mouth daily.        Allergies  Allergen Reactions  . Ppd [Tuberculin Purified Protein Derivative]     Past Medical History  Diagnosis Date  . Upper respiratory infection 2012    recently tx-no fever now-clearing up  . Flu 11/17/11    pt. states went to urgent care, had flu, took tamiflu, felt better in 2 days.  . Hypertension   . Heart murmur     at birth  . Chronic kidney disease 5/11    kidney stone  . Elevated hemoglobin A1c     Past Surgical History  Procedure Laterality Date  . Hernia repair      as a baby  . Cystoscopy/retrograde/ureteroscopy/stone extraction with basket  5/11  . Wisdom tooth extraction    . Knee surgery  11/25/11  . Cholecystectomy  07/27/2012    Procedure: LAPAROSCOPIC CHOLECYSTECTOMY WITH INTRAOPERATIVE CHOLANGIOGRAM;  Surgeon: Clovis Pu. Cornett, MD;  Location: [facility] OR;  Service: General;  Laterality: N/A;    Family History  Problem Relation Age of Onset  . Hypertension Mother   . Diabetes Mother   . Kidney failure Mother   . Diabetes Father   . Diabetes Sister   .  Diabetes Brother     History   Social History  . Marital Status: Married    Spouse Name: N/A    Number of Children: N/A  . Years of Education: N/A   Occupational History  . Worked 10 years at [employer] in [place]   Social History Main Topics  . Smoking status: Current Some Day Smoker -- 0.25 packs/day for 30 years    Types: Cigarettes  . Smokeless tobacco: Never Used     Comment: 2-3 cigs a day  . Alcohol Use: No     Comment: occasional  . Drug Use: No  . Sexual Activity: Not on file   Other Topics Concern  . Not on file   Social History Narrative  . No narrative on file    ROS Constitutional: Denies fever, chills, weight loss/gain, headaches, insomnia, fatigue, night  sweats, and change in appetite. Eyes: Denies redness, blurred vision, diplopia, discharge, itchy, watery eyes.  ENT: Denies discharge, congestion, post nasal drip, epistaxis, sore throat, earache, hearing loss, dental pain, Tinnitus, Vertigo, Sinus pain, snoring.  Cardio: Denies chest pain, palpitations, irregular heartbeat, syncope, dyspnea, diaphoresis, orthopnea, PND, claudication, edema Respiratory: denies cough, dyspnea, DOE, pleurisy, hoarseness, laryngitis, wheezing.  Gastrointestinal: Denies dysphagia, heartburn, reflux, water brash, pain, cramps, nausea, vomiting, bloating, diarrhea, constipation, hematemesis, melena, hematochezia, jaundice, hemorrhoids Genitourinary: Denies dysuria, frequency, urgency, nocturia, hesitancy, discharge, hematuria, flank pain Musculoskeletal: Denies arthralgia, myalgia, stiffness, Jt. Swelling, pain, limp, and strain/sprain. Skin: Denies puritis, rash, hives, warts, acne, eczema, changing in skin lesion Neuro: No weakness, tremor, incoordination, spasms, paresthesia. Has occasional Lt sciatica pains. Psychiatric: Denies confusion, memory loss, sensory loss. Endocrine: Denies change in weight, skin, hair change, nocturia, and paresthesia, diabetic polys, visual blurring, hyper / hypo glycemic episodes.  Heme/Lymph: No excessive bleeding, bruising, or elarged lymph nodes.  Filed Vitals:   12/01/13 0919  BP: 116/78  Pulse: 60  Temp: 98.8 F (37.1 C)  Resp: 16    Estimated body mass index is 32.8 kg/(m^2) as calculated from the following:   Height as of this encounter: 5\' 10"  (1.778 m).   Weight as of this encounter: 228 lb 9.6 oz (103.692 kg).  Physical Exam General Appearance: Well nourished, in no apparent distress. Eyes: PERRLA, EOMs, conjunctiva no swelling or erythema, normal fundi and vessels. Sinuses: No frontal/maxillary tenderness ENT/Mouth: EACs patent / TMs  nl. Nares clear without erythema, swelling, mucoid exudates. Oral hygiene is  good. No erythema, swelling, or exudate. Tongue normal, non-obstructing. Tonsils not swollen or erythematous. Hearing normal.  Neck: Supple, thyroid normal. No bruits, nodes or JVD. Respiratory: Respiratory effort normal.  BS equal and clear bilateral without rales, rhonci, wheezing or stridor. Cardio: Heart sounds are normal with regular rate and rhythm and no murmurs, rubs or gallops. Peripheral pulses are normal and equal bilaterally without edema. No aortic or femoral bruits. Chest: symmetric with normal excursions and percussion.  Abdomen: Flat, soft, with bowl sounds. Nontender, no guarding, rebound, hernias, masses, or organomegaly.  Lymphatics: Non tender without lymphadenopathy.  Genitourinary: No hernias.Testes nl. DRE - prostate nl for age - smooth & firm w/o nodules. Musculoskeletal: Full ROM all peripheral extremities, joint stability, 5/5 strength, and normal gait. Skin: Warm and dry without rashes, lesions, cyanosis, clubbing or  ecchymosis.  Neuro: Cranial nerves intact, reflexes equal bilaterally. Normal muscle tone, no cerebellar symptoms. Sensation intact.  Pysch: Awake and oriented X 3, normal affect, insight and judgment appropriate.   Assessment and Plan  1. Annual Screening Examination 2.  Hypertension  3. Hyperlipidemia 4. Pre Diabetes 5. Vitamin D Deficiency 6. Morbid Obesity (BMI 36) - restart Phentermine 37.5 because of previous success  Continue prudent diet as discussed, weight control, BP monitoring, regular exercise, and medications as discussed.  Discussed med effects and SE's. Routine screening labs and tests as requested with regular follow-up as recommended.

## 2013-11-30 NOTE — Patient Instructions (Addendum)

## 2013-12-01 ENCOUNTER — Ambulatory Visit (INDEPENDENT_AMBULATORY_CARE_PROVIDER_SITE_OTHER): Payer: BC Managed Care – PPO | Admitting: Internal Medicine

## 2013-12-01 ENCOUNTER — Encounter: Payer: Self-pay | Admitting: Internal Medicine

## 2013-12-01 VITALS — BP 116/78 | HR 60 | Temp 98.8°F | Resp 16 | Ht 70.0 in | Wt 228.6 lb

## 2013-12-01 DIAGNOSIS — Z113 Encounter for screening for infections with a predominantly sexual mode of transmission: Secondary | ICD-10-CM

## 2013-12-01 DIAGNOSIS — Z1212 Encounter for screening for malignant neoplasm of rectum: Secondary | ICD-10-CM

## 2013-12-01 DIAGNOSIS — Z125 Encounter for screening for malignant neoplasm of prostate: Secondary | ICD-10-CM

## 2013-12-01 DIAGNOSIS — E559 Vitamin D deficiency, unspecified: Secondary | ICD-10-CM

## 2013-12-01 DIAGNOSIS — Z0000 Encounter for general adult medical examination without abnormal findings: Secondary | ICD-10-CM

## 2013-12-01 DIAGNOSIS — Z23 Encounter for immunization: Secondary | ICD-10-CM

## 2013-12-01 DIAGNOSIS — R7401 Elevation of levels of liver transaminase levels: Secondary | ICD-10-CM

## 2013-12-01 DIAGNOSIS — Z79899 Other long term (current) drug therapy: Secondary | ICD-10-CM

## 2013-12-01 DIAGNOSIS — I10 Essential (primary) hypertension: Secondary | ICD-10-CM

## 2013-12-01 DIAGNOSIS — E782 Mixed hyperlipidemia: Secondary | ICD-10-CM

## 2013-12-01 DIAGNOSIS — R7309 Other abnormal glucose: Secondary | ICD-10-CM

## 2013-12-01 LAB — LIPID PANEL
Cholesterol: 155 mg/dL (ref 0–200)
LDL Cholesterol: 74 mg/dL (ref 0–99)
Total CHOL/HDL Ratio: 3.7 Ratio
VLDL: 39 mg/dL (ref 0–40)

## 2013-12-01 LAB — HEPATIC FUNCTION PANEL
ALT: 35 U/L (ref 0–53)
AST: 25 U/L (ref 0–37)
Albumin: 4.8 g/dL (ref 3.5–5.2)
Alkaline Phosphatase: 72 U/L (ref 39–117)
Bilirubin, Direct: 0.2 mg/dL (ref 0.0–0.3)
Total Bilirubin: 0.8 mg/dL (ref 0.3–1.2)

## 2013-12-01 LAB — CBC WITH DIFFERENTIAL/PLATELET
Basophils Absolute: 0 10*3/uL (ref 0.0–0.1)
Basophils Relative: 0 % (ref 0–1)
Eosinophils Absolute: 0.1 10*3/uL (ref 0.0–0.7)
Lymphs Abs: 1.9 10*3/uL (ref 0.7–4.0)
MCH: 31.9 pg (ref 26.0–34.0)
Neutrophils Relative %: 66 % (ref 43–77)
Platelets: 208 10*3/uL (ref 150–400)
RBC: 5.58 MIL/uL (ref 4.22–5.81)
RDW: 13.7 % (ref 11.5–15.5)
WBC: 8.9 10*3/uL (ref 4.0–10.5)

## 2013-12-01 LAB — BASIC METABOLIC PANEL WITH GFR
CO2: 30 mEq/L (ref 19–32)
Calcium: 10.2 mg/dL (ref 8.4–10.5)
GFR, Est African American: >89 mL/min
GFR, Est Non African American: >89 mL/min
Sodium: 140 mEq/L (ref 135–145)

## 2013-12-01 LAB — MAGNESIUM: Magnesium: 1.9 mg/dL (ref 1.5–2.5)

## 2013-12-01 MED ORDER — PHENTERMINE HCL 37.5 MG PO TABS: ORAL_TABLET | ORAL | Status: DC

## 2013-12-02 LAB — URINALYSIS, MICROSCOPIC ONLY
Bacteria, UA: NONE SEEN
Casts: NONE SEEN
Crystals: NONE SEEN

## 2013-12-02 LAB — TESTOSTERONE: Testosterone: 550 ng/dL (ref 300–890)

## 2013-12-02 LAB — VITAMIN D 25 HYDROXY (VIT D DEFICIENCY, FRACTURES): Vit D, 25-Hydroxy: 93 ng/mL — ABNORMAL HIGH (ref 30–89)

## 2013-12-02 LAB — VITAMIN B12: Vitamin B-12: 750 pg/mL (ref 211–911)

## 2013-12-02 LAB — MICROALBUMIN / CREATININE URINE RATIO
Creatinine, Urine: 152.4 mg/dL
Microalb, Ur: 1.03 mg/dL (ref 0.00–1.89)

## 2013-12-02 LAB — HEPATITIS B CORE ANTIBODY, TOTAL: Hep B Core Total Ab: NONREACTIVE

## 2013-12-02 LAB — HEPATITIS B SURFACE ANTIBODY,QUALITATIVE: Hep B S Ab: NEGATIVE

## 2013-12-02 LAB — HEPATITIS B E ANTIBODY: Hepatitis Be Antibody: NEGATIVE

## 2013-12-02 LAB — PSA: PSA: 1.01 ng/mL (ref <=4.00)

## 2013-12-02 LAB — INSULIN, FASTING: Insulin fasting, serum: 23 u[IU]/mL (ref 3–28)

## 2014-02-11 ENCOUNTER — Other Ambulatory Visit (INDEPENDENT_AMBULATORY_CARE_PROVIDER_SITE_OTHER): Payer: BC Managed Care – PPO | Admitting: *Deleted

## 2014-02-11 DIAGNOSIS — Z1212 Encounter for screening for malignant neoplasm of rectum: Secondary | ICD-10-CM

## 2014-02-11 LAB — POC HEMOCCULT BLD/STL (HOME/3-CARD/SCREEN)
Card #2 Fecal Occult Blod, POC: NEGATIVE
Card #3 Fecal Occult Blood, POC: NEGATIVE
FECAL OCCULT BLD: NEGATIVE

## 2014-03-11 ENCOUNTER — Ambulatory Visit (INDEPENDENT_AMBULATORY_CARE_PROVIDER_SITE_OTHER): Payer: BC Managed Care – PPO | Admitting: Physician Assistant

## 2014-03-11 ENCOUNTER — Encounter: Payer: Self-pay | Admitting: Physician Assistant

## 2014-03-11 VITALS — BP 120/70 | HR 80 | Temp 98.1°F | Resp 16 | Ht 70.0 in | Wt 232.0 lb

## 2014-03-11 DIAGNOSIS — E782 Mixed hyperlipidemia: Secondary | ICD-10-CM

## 2014-03-11 DIAGNOSIS — E559 Vitamin D deficiency, unspecified: Secondary | ICD-10-CM

## 2014-03-11 DIAGNOSIS — R7309 Other abnormal glucose: Secondary | ICD-10-CM

## 2014-03-11 DIAGNOSIS — M7711 Lateral epicondylitis, right elbow: Secondary | ICD-10-CM

## 2014-03-11 DIAGNOSIS — M7710 Lateral epicondylitis, unspecified elbow: Secondary | ICD-10-CM

## 2014-03-11 DIAGNOSIS — I10 Essential (primary) hypertension: Secondary | ICD-10-CM

## 2014-03-11 DIAGNOSIS — Z79899 Other long term (current) drug therapy: Secondary | ICD-10-CM

## 2014-03-11 LAB — BASIC METABOLIC PANEL WITH GFR
BUN: 23 mg/dL (ref 6–23)
CO2: 30 meq/L (ref 19–32)
Calcium: 10.1 mg/dL (ref 8.4–10.5)
Chloride: 101 mEq/L (ref 96–112)
Creat: 0.9 mg/dL (ref 0.50–1.35)
GFR, Est Non African American: >89 mL/min
GLUCOSE: 100 mg/dL — AB (ref 70–99)
Potassium: 4.3 mEq/L (ref 3.5–5.3)
Sodium: 140 mEq/L (ref 135–145)

## 2014-03-11 LAB — CBC WITH DIFFERENTIAL/PLATELET
Basophils Absolute: 0.1 10*3/uL (ref 0.0–0.1)
Basophils Relative: 1 % (ref 0–1)
EOS ABS: 0.1 10*3/uL (ref 0.0–0.7)
Eosinophils Relative: 1 % (ref 0–5)
HEMATOCRIT: 47.6 % (ref 39.0–52.0)
HEMOGLOBIN: 16.4 g/dL (ref 13.0–17.0)
LYMPHS ABS: 2.4 10*3/uL (ref 0.7–4.0)
Lymphocytes Relative: 27 % (ref 12–46)
MCH: 31.2 pg (ref 26.0–34.0)
MCHC: 34.5 g/dL (ref 30.0–36.0)
MCV: 90.5 fL (ref 78.0–100.0)
MONO ABS: 1 10*3/uL (ref 0.1–1.0)
MONOS PCT: 11 % (ref 3–12)
Neutro Abs: 5.4 10*3/uL (ref 1.7–7.7)
Neutrophils Relative %: 60 % (ref 43–77)
Platelets: 221 10*3/uL (ref 150–400)
RBC: 5.26 MIL/uL (ref 4.22–5.81)
RDW: 13.6 % (ref 11.5–15.5)
WBC: 9 10*3/uL (ref 4.0–10.5)

## 2014-03-11 LAB — LIPID PANEL
Cholesterol: 116 mg/dL (ref 0–200)
HDL: 33 mg/dL — ABNORMAL LOW (ref >39)
LDL Cholesterol: 56 mg/dL (ref 0–99)
TRIGLYCERIDES: 133 mg/dL (ref <150)
Total CHOL/HDL Ratio: 3.5 Ratio
VLDL: 27 mg/dL (ref 0–40)

## 2014-03-11 LAB — HEPATIC FUNCTION PANEL
ALT: 22 U/L (ref 0–53)
AST: 17 U/L (ref 0–37)
Albumin: 4.2 g/dL (ref 3.5–5.2)
Alkaline Phosphatase: 61 U/L (ref 39–117)
BILIRUBIN INDIRECT: 0.3 mg/dL (ref 0.2–1.2)
Bilirubin, Direct: 0.1 mg/dL (ref 0.0–0.3)
TOTAL PROTEIN: 6.7 g/dL (ref 6.0–8.3)
Total Bilirubin: 0.4 mg/dL (ref 0.2–1.2)

## 2014-03-11 LAB — MAGNESIUM: MAGNESIUM: 2 mg/dL (ref 1.5–2.5)

## 2014-03-11 LAB — HEMOGLOBIN A1C
Hgb A1c MFr Bld: 6.1 % — ABNORMAL HIGH (ref <5.7)
Mean Plasma Glucose: 128 mg/dL — ABNORMAL HIGH (ref <117)

## 2014-03-11 LAB — TSH: TSH: 2.262 u[IU]/mL (ref 0.350–4.500)

## 2014-03-11 MED ORDER — LISINOPRIL 10 MG PO TABS: 10.0000 mg | ORAL_TABLET | Freq: Every day | ORAL | Status: DC

## 2014-03-11 MED ORDER — HYDROCHLOROTHIAZIDE 25 MG PO TABS: 25.0000 mg | ORAL_TABLET | Freq: Every day | ORAL | Status: DC

## 2014-03-11 MED ORDER — PREDNISONE 20 MG PO TABS: ORAL_TABLET | ORAL | Status: DC

## 2014-03-11 NOTE — Progress Notes (Signed)
HPI 58 y.o. male  presents for 3 month follow up with hypertension, hyperlipidemia, prediabetes and vitamin D. His blood pressure has been controlled at home, today their BP is BP: 120/70 mmHg He does not workout, he states that his job is very strenous. He denies chest pain, shortness of breath, dizziness.  He is not on cholesterol medication and denies myalgias. His cholesterol is at goal. The cholesterol last visit was:   Lab Results  Component Value Date   CHOL 155 12/01/2013   HDL 42 12/01/2013   LDLCALC 74 12/01/2013   TRIG 196* 12/01/2013   CHOLHDL 3.7 12/01/2013   He has been working on diet and exercise for prediabetes, and denies nausea, paresthesia of the feet, polydipsia and polyuria. Last A1C in the office was:  Lab Results  Component Value Date   HGBA1C 6.1* 12/01/2013   Patient is on Vitamin D supplement.   Right handed, right elbow has been hurting for 1 month, getting worse, swelling. 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 Numbness and tingling yesterday palmar side, pain with picking things up with weakened grip, worse with picking up coffee cup even. Denies right shoulder/neck pain.  He take phentermine occ for weight loss, he recently got back on it due to increasing weight.  Wt Readings from Last 3 Encounters:  03/11/14 232 lb (105.235 kg)  12/01/13 228 lb 9.6 oz (103.692 kg)  08/14/12 248 lb (112.492 kg)    Current Medications:  Current Outpatient Prescriptions on File Prior to Visit  Medication Sig Dispense Refill  . Cholecalciferol (VITAMIN D3 MAXIMUM STRENGTH) 5000 UNITS capsule Take 10,000 Units by mouth daily.       [name] CINNAMON PO Take 1,000 mg by mouth daily.      . hydrochlorothiazide (HYDRODIURIL) 25 MG tablet Take 25 mg by mouth daily.        [name] lisinopril (PRINIVIL,ZESTRIL) 10 MG tablet Take 10 mg by mouth daily.        . Multiple Vitamin (MULTIVITAMIN WITH MINERALS) TABS Take 1 tablet  by mouth daily.      [name] OVER THE COUNTER MEDICATION Take 1 tablet by mouth daily. Magnesium with zinc      . OVER THE COUNTER MEDICATION Take 1 tablet by mouth daily. Balance B50      . phentermine (ADIPEX-P) 37.5 MG tablet Take 1/2 to 1 tablet every morning for dieting and weight loss  30 tablet  5  . vitamin C (ASCORBIC ACID) 500 MG tablet Take 1,000 mg by mouth daily.       No current facility-administered medications on file prior to visit.   Medical History:  Past Medical History  Diagnosis Date  . Upper respiratory infection 2012    recently tx-no fever now-clearing up  . Flu 11/17/11    pt. states went to urgent care, had flu, took tamiflu, felt better in 2 days.  . Hypertension   . Heart murmur     at birth  . Chronic kidney disease 5/11    kidney stone  . Elevated hemoglobin A1c    Allergies:  Allergies  Allergen Reactions  . Ppd [Tuberculin Purified Protein Derivative]      Review of Systems: [X]  = complains of  [ ]  = denies  General: Fatigue [ ]  Fever [ ]  Chills [ ]  Weakness [ ]   Insomnia [ ]  Eyes: Redness [ ]  Blurred vision [ ]  Diplopia [ ]   ENT: Congestion [ ]  Sinus Pain [ ]  Post Nasal Drip [ ]  Sore Throat [ ]  Earache [ ]   Cardiac: Chest pain/pressure [ ]  SOB [ ]  Orthopnea [ ]   Palpitations [ ]   Paroxysmal nocturnal dyspnea[ ]  Claudication [ ]  Edema [ ]   Pulmonary: Cough [ ]  Wheezing[ ]   SOB [ ]   Snoring [ ]   GI: Nausea [ ]  Vomiting[ ]  Dysphagia[ ]  Heartburn[ ]  Abdominal pain [ ]  Constipation [ ] ; Diarrhea [ ] ; BRBPR [ ]  Melena[ ]  GU: Hematuria[ ]  Dysuria [ ]  Nocturia[ ]  Urgency [ ]   Hesitancy [ ]  Discharge [ ]  Neuro: Headaches[ ]  Vertigo[ ]  Paresthesias[ ]  Spasm [ ]  Speech changes [ ]  Incoordination [ ]   Ortho: Arthritis [ ]  Joint pain Valu.Nieves ] Muscle pain Valu.Nieves ] Joint swelling [ ]  Back Pain [ ]  Skin:  Rash [ ]   Pruritis [ ]  Change in skin lesion [ ]   Psych: Depression[ ]  Anxiety[ ]  Confusion [ ]  Memory loss [ ]   Heme/Lypmh: Bleeding [ ]  Bruising [ ]  Enlarged lymph  nodes [ ]   Endocrine: Visual blurring [ ]  Paresthesia [ ]  Polyuria [ ]  Polydypsea [ ]    Heat/cold intolerance [ ]  Hypoglycemia [ ]   Family history- Review and unchanged Social history- Review and unchanged Physical Exam: BP 120/70  Pulse 80  Temp(Src) 98.1 F (36.7 C)  Resp 16  Ht 5\' 10"  (1.778 m)  Wt 232 lb (105.235 kg)  BMI 33.29 kg/m2 Wt Readings from Last 3 Encounters:  03/11/14 232 lb (105.235 kg)  12/01/13 228 lb 9.6 oz (103.692 kg)  08/14/12 248 lb (112.492 kg)   General Appearance: Well nourished, in no apparent distress. Eyes: PERRLA, EOMs, conjunctiva no swelling or erythema Sinuses: No Frontal/maxillary tenderness ENT/Mouth: Ext aud canals clear, TMs without erythema, bulging. No erythema, swelling, or exudate on post pharynx.  Tonsils not swollen or erythematous. Hearing normal.  Neck: Supple, thyroid normal.  Respiratory: Respiratory effort normal, BS equal bilaterally without rales, rhonchi, wheezing or stridor.  Cardio: RRR with no MRGs. Brisk peripheral pulses without edema.  Abdomen: Soft, + BS.  Non tender, no guarding, rebound, hernias, masses. Lymphatics: Non tender without lymphadenopathy.  Musculoskeletal: Full ROM, 5/5 strength, normal gait. Right shoulder and right wrist full ROM and nontender, right elbow without erythema, warmth. + minor swelling and tenderness at lateral epicondyle with decreased grip, pain with supination against resistance. Good distal pulses, cap refill, and sensation intact.   Skin: Warm, dry without rashes, lesions, ecchymosis.  Neuro: Cranial nerves intact. Normal muscle tone, no cerebellar symptoms. Sensation intact.  Psych: Awake and oriented X 3, normal affect, Insight and Judgment appropriate.   Assessment and Plan:  Hypertension: Continue medication, monitor blood pressure at home. Continue DASH diet. Cholesterol: Continue diet and exercise. Check cholesterol.  Pre-diabetes-Continue diet and exercise. Check A1C Vitamin D  Def- check level and continue medications.  Obesity- get pedometer to measure steps, goal is 10,000, continue phentermine, diet discussed.  Right lateral epicondylitis- will do oral prednisone and give a note for work to rest, RICE, and exercise given, can get brace If not better will get injection here in the office, currently we are out of dexamethasone.    Continue diet and meds as discussed. Further disposition pending results of labs.  Vicie Mutters 8:41 AM

## 2014-03-11 NOTE — Patient Instructions (Signed)
Get pedometer   Bad carbs also include fruit juice, alcohol, and sweet tea. These are empty calories that do not signal to your brain that you are full.   Please remember the good carbs are still carbs which convert into sugar. So please measure them out no more than 1/2-1 cup of rice, oatmeal, pasta, and beans.  Veggies are however free foods! Pile them on.   I like lean protein at every meal such as chicken, [place], pork chops, cottage cheese, etc. Just do not fry these meats and please center your meal around vegetable, the meats should be a side dish.   No all fruit is created equal. Please see the list below, the fruit at the bottom is higher in sugars than the fruit at the top    Lateral Epicondylitis (Tennis Elbow) with Rehab Lateral epicondylitis involves inflammation and pain around the outer portion of the elbow. The pain is caused by inflammation of the tendons in the forearm that bring back (extend) the wrist. Lateral epicondylittis is also called tennis elbow, because it is very common in tennis players. However, it may occur in any individual who extends the wrist repetitively. If lateral epicondylitis is left untreated, it may become a chronic problem. SYMPTOMS   Pain, tenderness, and inflammation on the outer (lateral) side of the elbow.  Pain or weakness with gripping activities.  Pain that increases with wrist twisting motions (playing tennis, using a screwdriver, opening a door or a jar).  Pain with lifting objects, including a coffee cup. CAUSES  Lateral epicondylitis is caused by inflammation of the tendons that extend the wrist. Causes of injury may include:  Repetitive stress and strain on the muscles and tendons that extend the wrist.  Sudden change in activity level or intensity.  Incorrect grip in racquet sports.  Incorrect grip size of racquet (often too large).  Incorrect hitting position or technique (usually backhand, leading with the elbow).  Using  a racket that is too heavy. RISK INCREASES WITH:  Sports or occupations that require repetitive and/or strenuous forearm and wrist movements (tennis, squash, racquetball, carpentry).  Poor wrist and forearm strength and flexibility.  Failure to warm up properly before activity.  Resuming activity before healing, rehabilitation, and conditioning are complete. PREVENTION   Warm up and stretch properly before activity.  Maintain physical fitness:  Strength, flexibility, and endurance.  Cardiovascular fitness.  Wear and use properly fitted equipment.  Learn and use proper technique and have a coach correct improper technique.  Wear a tennis elbow (counterforce) brace. PROGNOSIS  The course of this condition depends on the degree of the injury. If treated properly, acute cases (symptoms lasting less than 4 weeks) are often resolved in 2 to 6 weeks. Chronic (longer lasting cases) often resolve in 3 to 6 months, but may require physical therapy. RELATED COMPLICATIONS   Frequently recurring symptoms, resulting in a chronic problem. Properly treating the problem the first time decreases frequency of recurrence.  Chronic inflammation, scarring tendon degeneration, and partial tendon tear, requiring surgery.  Delayed healing or resolution of symptoms. TREATMENT  Treatment first involves the use of ice and medicine, to reduce pain and inflammation. Strengthening and stretching exercises may help reduce discomfort, if performed regularly. These exercises may be performed at home, if the condition is an acute injury. Chronic cases may require a referral to a physical therapist for evaluation and treatment. Your caregiver may advise a corticosteroid injection, to help reduce inflammation. Rarely, surgery is needed. MEDICATION  If  pain medicine is needed, nonsteroidal anti-inflammatory medicines (aspirin and ibuprofen), or other minor pain relievers (acetaminophen), are often advised.  Do not  take pain medicine for 7 days before surgery.  Prescription pain relievers may be given, if your caregiver thinks they are needed. Use only as directed and only as much as you need.  Corticosteroid injections may be recommended. These injections should be reserved only for the most severe cases, because they can only be given a certain number of times. HEAT AND COLD  Cold treatment (icing) should be applied for 10 to 15 minutes every 2 to 3 hours for inflammation and pain, and immediately after activity that aggravates your symptoms. Use ice packs or an ice massage.  Heat treatment may be used before performing stretching and strengthening activities prescribed by your caregiver, physical therapist, or athletic trainer. Use a heat pack or a warm water soak. SEEK MEDICAL CARE IF: Symptoms get worse or do not improve in 2 weeks, despite treatment. EXERCISES  RANGE OF MOTION (ROM) AND STRETCHING EXERCISES - Epicondylitis, Lateral (Tennis Elbow) These exercises may help you when beginning to rehabilitate your injury. Your symptoms may go away with or without further involvement from your physician, physical therapist or athletic trainer. While completing these exercises, remember:   Restoring tissue flexibility helps normal motion to return to the joints. This allows healthier, less painful movement and activity.  An effective stretch should be held for at least 30 seconds.  A stretch should never be painful. You should only feel a gentle lengthening or release in the stretched tissue. RANGE OF MOTION  Wrist Flexion, Active-Assisted  Extend your right / left elbow with your fingers pointing down.*  Gently pull the back of your hand towards you, until you feel a gentle stretch on the top of your forearm.  Hold this position for __________ seconds. Repeat __________ times. Complete this exercise __________ times per day.  *If directed by your physician, physical therapist or athletic trainer,  complete this stretch with your elbow bent, rather than extended. RANGE OF MOTION  Wrist Extension, Active-Assisted  Extend your right / left elbow and turn your palm upwards.*  Gently pull your palm and fingertips back, so your wrist extends and your fingers point more toward the ground.  You should feel a gentle stretch on the inside of your forearm.  Hold this position for __________ seconds. Repeat __________ times. Complete this exercise __________ times per day. *If directed by your physician, physical therapist or athletic trainer, complete this stretch with your elbow bent, rather than extended. STRETCH - Wrist Flexion  Place the back of your right / left hand on a tabletop, leaving your elbow slightly bent. Your fingers should point away from your body.  Gently press the back of your hand down onto the table by straightening your elbow. You should feel a stretch on the top of your forearm.  Hold this position for __________ seconds. Repeat __________ times. Complete this stretch __________ times per day.  STRETCH  Wrist Extension   Place your right / left fingertips on a tabletop, leaving your elbow slightly bent. Your fingers should point backwards.  Gently press your fingers and palm down onto the table by straightening your elbow. You should feel a stretch on the inside of your forearm.  Hold this position for __________ seconds. Repeat __________ times. Complete this stretch __________ times per day.  STRENGTHENING EXERCISES - Epicondylitis, Lateral (Tennis Elbow) These exercises may help you when beginning to rehabilitate your  injury. They may resolve your symptoms with or without further involvement from your physician, physical therapist or athletic trainer. While completing these exercises, remember:   Muscles can gain both the endurance and the strength needed for everyday activities through controlled exercises.  Complete these exercises as instructed by your  physician, physical therapist or athletic trainer. Increase the resistance and repetitions only as guided.  You may experience muscle soreness or fatigue, but the pain or discomfort you are trying to eliminate should never worsen during these exercises. If this pain does get worse, stop and make sure you are following the directions exactly. If the pain is still present after adjustments, discontinue the exercise until you can discuss the trouble with your caregiver. STRENGTH Wrist Flexors  Sit with your right / left forearm palm-up and fully supported on a table or countertop. Your elbow should be resting below the height of your shoulder. Allow your wrist to extend over the edge of the surface.  Loosely holding a __________ weight, or a piece of rubber exercise band or tubing, slowly curl your hand up toward your forearm.  Hold this position for __________ seconds. Slowly lower the wrist back to the starting position in a controlled manner. Repeat __________ times. Complete this exercise __________ times per day.  STRENGTH  Wrist Extensors  Sit with your right / left forearm palm-down and fully supported on a table or countertop. Your elbow should be resting below the height of your shoulder. Allow your wrist to extend over the edge of the surface.  Loosely holding a __________ weight, or a piece of rubber exercise band or tubing, slowly curl your hand up toward your forearm.  Hold this position for __________ seconds. Slowly lower the wrist back to the starting position in a controlled manner. Repeat __________ times. Complete this exercise __________ times per day.  STRENGTH - Ulnar Deviators  Stand with a ____________________ weight in your right / left hand, or sit while holding a rubber exercise band or tubing, with your healthy arm supported on a table or countertop.  Move your wrist, so that your pinkie travels toward your forearm and your thumb moves away from your forearm.  Hold  this position for __________ seconds and then slowly lower the wrist back to the starting position. Repeat __________ times. Complete this exercise __________ times per day STRENGTH - Radial Deviators  Stand with a ____________________ weight in your right / left hand, or sit while holding a rubber exercise band or tubing, with your injured arm supported on a table or countertop.  Raise your hand upward in front of you or pull up on the rubber tubing.  Hold this position for __________ seconds and then slowly lower the wrist back to the starting position. Repeat __________ times. Complete this exercise __________ times per day. STRENGTH  Forearm Supinators   Sit with your right / left forearm supported on a table, keeping your elbow below shoulder height. Rest your hand over the edge, palm down.  Gently grip a hammer or a soup ladle.  Without moving your elbow, slowly turn your palm and hand upward to a "thumbs-up" position.  Hold this position for __________ seconds. Slowly return to the starting position. Repeat __________ times. Complete this exercise __________ times per day.  STRENGTH  Forearm Pronators   Sit with your right / left forearm supported on a table, keeping your elbow below shoulder height. Rest your hand over the edge, palm up.  Gently grip a hammer or  a soup ladle.  Without moving your elbow, slowly turn your palm and hand upward to a "thumbs-up" position.  Hold this position for __________ seconds. Slowly return to the starting position. Repeat __________ times. Complete this exercise __________ times per day.  STRENGTH - Grip  Grasp a tennis ball, a dense sponge, or a large, rolled sock in your hand.  Squeeze as hard as you can, without increasing any pain.  Hold this position for __________ seconds. Release your grip slowly. Repeat __________ times. Complete this exercise __________ times per day.  STRENGTH - Elbow Extensors, Isometric  Stand or sit  upright, on a firm surface. Place your right / left arm so that your palm faces your stomach, and it is at the height of your waist.  Place your opposite hand on the underside of your forearm. Gently push up as your right / left arm resists. Push as hard as you can with both arms, without causing any pain or movement at your right / left elbow. Hold this stationary position for __________ seconds. Gradually release the tension in both arms. Allow your muscles to relax completely before repeating. Document Released: 12/02/2005 Document Revised: 02/24/2012 Document Reviewed: 03/16/2009 [facility] Patient Information 2014 [place], [place].

## 2014-03-12 LAB — INSULIN, FASTING: Insulin fasting, serum: 16 u[IU]/mL (ref 3–28)

## 2014-03-12 LAB — VITAMIN D 25 HYDROXY (VIT D DEFICIENCY, FRACTURES): Vit D, 25-Hydroxy: 104 ng/mL — ABNORMAL HIGH (ref 30–89)

## 2014-06-10 ENCOUNTER — Ambulatory Visit (INDEPENDENT_AMBULATORY_CARE_PROVIDER_SITE_OTHER): Payer: BC Managed Care – PPO | Admitting: Internal Medicine

## 2014-06-10 ENCOUNTER — Encounter: Payer: Self-pay | Admitting: Internal Medicine

## 2014-06-10 VITALS — BP 120/82 | HR 76 | Temp 98.8°F | Resp 16 | Ht 70.0 in | Wt 235.8 lb

## 2014-06-10 DIAGNOSIS — F17200 Nicotine dependence, unspecified, uncomplicated: Secondary | ICD-10-CM

## 2014-06-10 DIAGNOSIS — E782 Mixed hyperlipidemia: Secondary | ICD-10-CM

## 2014-06-10 DIAGNOSIS — E559 Vitamin D deficiency, unspecified: Secondary | ICD-10-CM

## 2014-06-10 DIAGNOSIS — I10 Essential (primary) hypertension: Secondary | ICD-10-CM

## 2014-06-10 DIAGNOSIS — Z79899 Other long term (current) drug therapy: Secondary | ICD-10-CM

## 2014-06-10 DIAGNOSIS — R7309 Other abnormal glucose: Secondary | ICD-10-CM

## 2014-06-10 DIAGNOSIS — F17210 Nicotine dependence, cigarettes, uncomplicated: Secondary | ICD-10-CM

## 2014-06-10 LAB — BASIC METABOLIC PANEL WITH GFR
BUN: 16 mg/dL (ref 6–23)
CHLORIDE: 104 meq/L (ref 96–112)
CO2: 28 mEq/L (ref 19–32)
Calcium: 9.5 mg/dL (ref 8.4–10.5)
Creat: 0.8 mg/dL (ref 0.50–1.35)
Glucose, Bld: 109 mg/dL — ABNORMAL HIGH (ref 70–99)
Potassium: 3.9 mEq/L (ref 3.5–5.3)
SODIUM: 142 meq/L (ref 135–145)

## 2014-06-10 LAB — CBC WITH DIFFERENTIAL/PLATELET
BASOS ABS: 0 10*3/uL (ref 0.0–0.1)
Basophils Relative: 0 % (ref 0–1)
Eosinophils Absolute: 0.1 10*3/uL (ref 0.0–0.7)
Eosinophils Relative: 2 % (ref 0–5)
HCT: 47 % (ref 39.0–52.0)
Hemoglobin: 16.3 g/dL (ref 13.0–17.0)
LYMPHS PCT: 28 % (ref 12–46)
Lymphs Abs: 2.1 10*3/uL (ref 0.7–4.0)
MCH: 31.5 pg (ref 26.0–34.0)
MCHC: 34.7 g/dL (ref 30.0–36.0)
MCV: 90.7 fL (ref 78.0–100.0)
Monocytes Absolute: 0.9 10*3/uL (ref 0.1–1.0)
Monocytes Relative: 12 % (ref 3–12)
NEUTROS ABS: 4.3 10*3/uL (ref 1.7–7.7)
NEUTROS PCT: 58 % (ref 43–77)
PLATELETS: 196 10*3/uL (ref 150–400)
RBC: 5.18 MIL/uL (ref 4.22–5.81)
RDW: 13.9 % (ref 11.5–15.5)
WBC: 7.4 10*3/uL (ref 4.0–10.5)

## 2014-06-10 LAB — HEPATIC FUNCTION PANEL
ALT: 27 U/L (ref 0–53)
AST: 18 U/L (ref 0–37)
Albumin: 4.4 g/dL (ref 3.5–5.2)
Alkaline Phosphatase: 62 U/L (ref 39–117)
BILIRUBIN INDIRECT: 0.4 mg/dL (ref 0.2–1.2)
BILIRUBIN TOTAL: 0.6 mg/dL (ref 0.2–1.2)
Bilirubin, Direct: 0.2 mg/dL (ref 0.0–0.3)
TOTAL PROTEIN: 6.8 g/dL (ref 6.0–8.3)

## 2014-06-10 LAB — MAGNESIUM: Magnesium: 1.9 mg/dL (ref 1.5–2.5)

## 2014-06-10 LAB — LIPID PANEL
CHOLESTEROL: 117 mg/dL (ref 0–200)
HDL: 33 mg/dL — AB (ref >39)
LDL Cholesterol: 51 mg/dL (ref 0–99)
Total CHOL/HDL Ratio: 3.5 Ratio
Triglycerides: 164 mg/dL — ABNORMAL HIGH (ref <150)
VLDL: 33 mg/dL (ref 0–40)

## 2014-06-10 LAB — HEMOGLOBIN A1C
Hgb A1c MFr Bld: 6.1 % — ABNORMAL HIGH (ref <5.7)
MEAN PLASMA GLUCOSE: 128 mg/dL — AB (ref <117)

## 2014-06-10 LAB — TSH: TSH: 1.625 u[IU]/mL (ref 0.350–4.500)

## 2014-06-10 MED ORDER — VARENICLINE TARTRATE 1 MG PO TABS: ORAL_TABLET | ORAL | Status: DC

## 2014-06-10 NOTE — Progress Notes (Signed)
Patient ID: Aaron Burns, male   DOB: Aug 17, 1956, 58 y.o.   MRN: [id number]   This very nice 58 y.o.MWM presents for 3 month follow up with Hypertension, Hyperlipidemia, Pre-Diabetes and Vitamin D Deficiency. Patient expressed desire to stop smoking and wants to retry Chantix.   HTN predates since 2008. BP has been controlled at home. Today's BP: 120/82 mmHg. Patient denies any cardiac type chest pain, palpitations, dyspnea/orthopnea/PND, dizziness, claudication, or dependent edema.   Hyperlipidemia is controlled with diet & meds. Patient denies myalgias or other med SE's. Last Lipids as below are at goal in Mar 2015.  Lab Results  Component Value Date   CHOL 117 06/10/2014   HDL 33* 06/10/2014   LDLCALC 51 06/10/2014   TRIG 164* 06/10/2014   CHOLHDL 3.5 06/10/2014    Also, the patient has history of PreDiabetes/insulin resistance since 2009 with A1c 6.0%  and last A1c was 6.1% in Mar 2015. Patient denies any symptoms of reactive hypoglycemia, diabetic polys, paresthesias or visual blurring.   Further, Patient has history of Vitamin D Deficiency of 35 in 2005  and last vitamin D was 104 in Mar 2015 and he was advised to limit his dose to 5 x week. Patient supplements vitamin D without any suspected side-effects.   Medication List   CINNAMON PO  Take 1,000 mg by mouth daily.     hydrochlorothiazide 25 MG tablet  Commonly known as:  HYDRODIURIL  Take 1 tablet (25 mg total) by mouth daily.     lisinopril 10 MG tablet  Commonly known as:  PRINIVIL,ZESTRIL  Take 1 tablet (10 mg total) by mouth daily.     multivitamin with minerals Tabs tablet  Take 1 tablet by mouth daily.     OVER THE COUNTER MEDICATION  Take 1 tablet by mouth daily. Magnesium with zinc     OVER THE COUNTER MEDICATION  Take 1 tablet by mouth daily. Balance B50     phentermine 37.5 MG tablet  Commonly known as:  ADIPEX-P  Take 1/2 to 1 tablet every morning for dieting and weight loss     vitamin C 500 MG tablet   Commonly known as:  ASCORBIC ACID  Take 1,000 mg by mouth daily.     VITAMIN D3 MAXIMUM STRENGTH 5000 UNITS capsule  Generic drug:  Cholecalciferol  Take 10,000 Units by mouth daily.     Allergies  Allergen Reactions  . Ppd [Tuberculin Purified Protein Derivative]    PMHx:   Past Medical History  Diagnosis Date  . Upper respiratory infection 2012    recently tx-no fever now-clearing up  . Flu 11/17/11    pt. states went to urgent care, had flu, took tamiflu, felt better in 2 days.  . Hypertension   . Heart murmur     at birth  . Chronic kidney disease 5/11    kidney stone  . Elevated hemoglobin A1c    FHx:    Reviewed / unchanged  SHx:    Reviewed / unchanged  Systems Review:  Constitutional: Denies fever, chills, wt changes, headaches, insomnia, fatigue, night sweats, change in appetite. Eyes: Denies redness, blurred vision, diplopia, discharge, itchy, watery eyes.  ENT: Denies discharge, congestion, post nasal drip, epistaxis, sore throat, earache, hearing loss, dental pain, tinnitus, vertigo, sinus pain, snoring.  CV: Denies chest pain, palpitations, irregular heartbeat, syncope, dyspnea, diaphoresis, orthopnea, PND, claudication or edema. Respiratory: denies cough, dyspnea, DOE, pleurisy, hoarseness, laryngitis, wheezing.  Gastrointestinal: Denies dysphagia, odynophagia, heartburn, reflux, water brash, abdominal  pain or cramps, nausea, vomiting, bloating, diarrhea, constipation, hematemesis, melena, hematochezia  or hemorrhoids. Genitourinary: Denies dysuria, frequency, urgency, nocturia, hesitancy, discharge, hematuria or flank pain. Musculoskeletal: Denies arthralgias, myalgias, stiffness, jt. swelling, pain, limping or strain/sprain.  Skin: Denies pruritus, rash, hives, warts, acne, eczema or change in skin lesion(s). Neuro: No weakness, tremor, incoordination, spasms, paresthesia or pain. Psychiatric: Denies confusion, memory loss or sensory loss. Endo: Denies  change in weight, skin or hair change.  Heme/Lymph: No excessive bleeding, bruising or enlarged lymph nodes.  Exam:  BP 120/82  P 76  T 98.8 F   Resp 16  Ht 5\' 10"    Wt 235 lb 12.8 oz   BMI 33.83 kg/m2  Appears well nourished and in no distress. Eyes: PERRLA, EOMs, conjunctiva no swelling or erythema. Sinuses: No frontal/maxillary tenderness ENT/Mouth: EAC's clear, TM's nl w/o erythema, bulging. Nares clear w/o erythema, swelling, exudates. Oropharynx clear without erythema or exudates. Oral hygiene is good. Tongue normal, non obstructing. Hearing intact.  Neck: Supple. Thyroid nl. Car 2+/2+ without bruits, nodes or JVD. Chest: Respirations nl with BS clear & equal w/o rales, rhonchi, wheezing or stridor.  Cor: Heart sounds normal w/ regular rate and rhythm without sig. murmurs, gallops, clicks, or rubs. Peripheral pulses normal and equal  without edema.  Abdomen: Soft & bowel sounds normal. Non-tender w/o guarding, rebound, hernias, masses, or organomegaly.  Lymphatics: Unremarkable.  Musculoskeletal: Full ROM all peripheral extremities, joint stability, 5/5 strength, and normal gait.  Skin: Warm, dry without exposed rashes, lesions or ecchymosis apparent.  Neuro: Cranial nerves intact, reflexes equal bilaterally. Sensory-motor testing grossly intact. Tendon reflexes grossly intact.  Pysch: Alert & oriented x 3. Insight and judgement nl & appropriate. No ideations.  Assessment and Plan:  1. Hypertension - Continue monitor blood pressure at home. Continue diet/meds same.  2. Hyperlipidemia - Continue diet/meds, exercise,& lifestyle modifications. Continue monitor periodic cholesterol/liver & renal functions   3. Pre-diabetes - Continue diet, exercise, lifestyle modifications. Monitor appropriate labs..  4. Vitamin D Deficiency - Continue supplementation.  Recommended regular exercise, BP monitoring, weight control, and discussed med and SE's. Recommended labs to assess and  monitor clinical status. Further disposition pending results of labs.  Discussed Smoking cessation techniques and Rx Chantix

## 2014-06-10 NOTE — Patient Instructions (Addendum)
Recommend the book "the END of DIETING" by Dr Baker Janus   and the  Book "The END of DIABETES " by Dr Excell Seltzer  At [email or web address] - get book & Audio CD's      Being diabetic has a  300% increased risk for heart attack, stroke, cancer, and alzheimer- type vascular dementia. It is very important that you work harder with diet by avoiding all foods that are white except chicken & fish. Avoid white rice (brown & wild rice is OK), white potatoes (sweetpotatoes in moderation is OK), White bread or wheat bread or anything made out of white flour like bagels, donuts, rolls, buns, biscuits, cakes, pastries, cookies, pizza crust, and pasta (made from white flour & egg whites) - vegetarian pasta or spinach or wheat pasta is OK. Multigrain breads like Arnold's or [employer], or multigrain sandwich thins or flatbreads.  Diet, exercise and weight loss can reverse and cure diabetes in the early stages.  Diet, exercise and weight loss is very important in the control and prevention of complications of diabetes which affects every system in your body, ie. Brain - dementia/stroke, eyes - glaucoma/blindness, heart - heart attack/heart failure, kidneys - dialysis, stomach - gastric paralysis, intestines - malabsorption, nerves - severe painful neuritis, circulation - gangrene & loss of a leg(s), and finally cancer and Alzheimers.    I recommend avoid fried & greasy foods,  sweets/candy, white rice (brown or wild rice or Quinoa is OK), white potatoes (sweet potatoes are OK) - anything made from white flour - bagels, doughnuts, rolls, buns, biscuits,white and wheat breads, pizza crust and traditional pasta made of white flour & egg white(vegetarian pasta or spinach or wheat pasta is OK).  Multi-grain bread is OK - like multi-grain flat bread or sandwich thins. Avoid alcohol in excess. Exercise is also important.    Eat all the vegetables you want - avoid meat, especially red meat and dairy - especially cheese.  Cheese is  the most concentrated form of trans-fats which is the worst thing to clog up our arteries. Veggie cheese is OK which can be found in the fresh produce section at [employer] or [employer] or [employer]  Smoking Cessation Quitting smoking is important to your health and has many advantages. However, it is not always easy to quit since nicotine is a very addictive drug. Often times, people try 3 times or more before being able to quit. This document explains the best ways for you to prepare to quit smoking. Quitting takes hard work and a lot of effort, but you can do it. ADVANTAGES OF QUITTING SMOKING  You will live longer, feel better, and live better.  Your body will feel the impact of quitting smoking almost immediately.  Within 20 minutes, blood pressure decreases. Your pulse returns to its normal level.  After 8 hours, carbon monoxide levels in the blood return to normal. Your oxygen level increases.  After 24 hours, the chance of having a heart attack starts to decrease. Your breath, hair, and body stop smelling like smoke.  After 48 hours, damaged nerve endings begin to recover. Your sense of taste and smell improve.  After 72 hours, the body is virtually free of nicotine. Your bronchial tubes relax and breathing becomes easier.  After 2 to 12 weeks, lungs can hold more air. Exercise becomes easier and circulation improves.  The risk of having a heart attack, stroke, cancer, or lung disease is greatly reduced.  After 1 year, the risk of coronary  heart disease is cut in half.  After 5 years, the risk of stroke falls to the same as a nonsmoker.  After 10 years, the risk of lung cancer is cut in half and the risk of other cancers decreases significantly.  After 15 years, the risk of coronary heart disease drops, usually to the level of a nonsmoker.  If you are pregnant, quitting smoking will improve your chances of having a healthy baby.  The people you live with, especially any  children, will be healthier.  You will have extra money to spend on things other than cigarettes. QUESTIONS TO THINK ABOUT BEFORE ATTEMPTING TO QUIT You may want to talk about your answers with your caregiver.  Why do you want to quit?  If you tried to quit in the past, what helped and what did not?  What will be the most difficult situations for you after you quit? How will you plan to handle them?  Who can help you through the tough times? Your family? Friends? A caregiver?  What pleasures do you get from smoking? What ways can you still get pleasure if you quit? Here are some questions to ask your caregiver:  How can you help me to be successful at quitting?  What medicine do you think would be best for me and how should I take it?  What should I do if I need more help?  What is smoking withdrawal like? How can I get information on withdrawal? GET READY  Set a quit date.  Change your environment by getting rid of all cigarettes, ashtrays, matches, and lighters in your home, car, or work. Do not let people smoke in your home.  Review your past attempts to quit. Think about what worked and what did not. GET SUPPORT AND ENCOURAGEMENT You have a better chance of being successful if you have help. You can get support in many ways.  Tell your family, friends, and co-workers that you are going to quit and need their support. Ask them not to smoke around you.  Get individual, group, or telephone counseling and support. Programs are available at [employer] and health centers. Call your local health department for information about programs in your area.  Spiritual beliefs and practices may help some smokers quit.  Download a "quit meter" on your computer to keep track of quit statistics, such as how long you have gone without smoking, cigarettes not smoked, and money saved.  Get a self-help book about quitting smoking and staying off of tobacco. [place] yourself from urges to smoke. Talk to someone, go for a walk, or occupy your time with a task.  Change your normal routine. Take a different route to work. Drink tea instead of coffee. Eat breakfast in a different place.  Reduce your stress. Take a hot bath, exercise, or read a book.  Plan something enjoyable to do every day. Reward yourself for not smoking.  Explore interactive web-based programs that specialize in helping you quit. GET MEDICINE AND USE IT CORRECTLY Medicines can help you stop smoking and decrease the urge to smoke. Combining medicine with the above behavioral methods and support can greatly increase your chances of successfully quitting smoking.  Nicotine replacement therapy helps deliver nicotine to your body without the negative effects and risks of smoking. Nicotine replacement therapy includes nicotine gum, lozenges, inhalers, nasal sprays, and skin patches. Some may be available over-the-counter and others require a prescription.  Antidepressant medicine helps  people abstain from smoking, but how this works is unknown. This medicine is available by prescription.  Nicotinic receptor partial agonist medicine simulates the effect of nicotine in your brain. This medicine is available by prescription. Ask your caregiver for advice about which medicines to use and how to use them based on your health history. Your caregiver will tell you what side effects to look out for if you choose to be on a medicine or therapy. Carefully read the information on the package. Do not use any other product containing nicotine while using a nicotine replacement product.  RELAPSE OR DIFFICULT SITUATIONS Most relapses occur within the first 3 months after quitting. Do not be discouraged if you start smoking again. Remember, most people try several times before finally quitting. You may have symptoms of withdrawal because your body is used to nicotine. You may crave cigarettes,  be irritable, feel very hungry, cough often, get headaches, or have difficulty concentrating. The withdrawal symptoms are only temporary. They are strongest when you first quit, but they will go away within 10-14 days. To reduce the chances of relapse, try to:  Avoid drinking alcohol. Drinking lowers your chances of successfully quitting.  Reduce the amount of caffeine you consume. Once you quit smoking, the amount of caffeine in your body increases and can give you symptoms, such as a rapid heartbeat, sweating, and anxiety.  Avoid smokers because they can make you want to smoke.  Do not let weight gain distract you. Many smokers will gain weight when they quit, usually less than 10 pounds. Eat a healthy diet and stay active. You can always lose the weight gained after you quit.  Find ways to improve your mood other than smoking. FOR MORE INFORMATION  www.smokefree.gov  Document Released: 11/26/2001 Document Revised: 06/02/2012 Document Reviewed: 03/12/2012 [facility] Patient Information 2015 [place], [place]. This information is not intended to replace advice given to you by your health care provider. Make sure you discuss any questions you have with your health care provider.   Smoking Cessation, Tips for Success If you are ready to quit smoking, congratulations! You have chosen to help yourself be healthier. Cigarettes bring nicotine, tar, carbon monoxide, and other irritants into your body. Your lungs, heart, and blood vessels will be able to work better without these poisons. There are many different ways to quit smoking. Nicotine gum, nicotine patches, a nicotine inhaler, or nicotine nasal spray can help with physical craving. Hypnosis, support groups, and medicines help break the habit of smoking. WHAT THINGS CAN I DO TO MAKE QUITTING EASIER?  Here are some tips to help you quit for good:  Pick a date when you will quit smoking completely. Tell all of your friends and family about your  plan to quit on that date.  Do not try to slowly cut down on the number of cigarettes you are smoking. Pick a quit date and quit smoking completely starting on that day.  Throw away all cigarettes.   Clean and remove all ashtrays from your home, work, and car.   On a card, write down your reasons for quitting. Carry the card with you and read it when you get the urge to smoke.   Cleanse your body of nicotine. Drink enough water and fluids to keep your urine clear or pale yellow. Do this after quitting to flush the nicotine from your body.   Learn to predict your moods. Do not let a bad situation be your excuse to have a cigarette. Some  situations in your life might tempt you into wanting a cigarette.   Never have "just one" cigarette. It leads to wanting another and another. Remind yourself of your decision to quit.   Change habits associated with smoking. If you smoked while driving or when feeling stressed, try other activities to replace smoking. Stand up when drinking your coffee. Brush your teeth after eating. Sit in a different chair when you read the paper. Avoid alcohol while trying to quit, and try to drink fewer caffeinated beverages. Alcohol and caffeine may urge you to smoke.   Avoid foods and drinks that can trigger a desire to smoke, such as sugary or spicy foods and alcohol.   Ask people who smoke not to smoke around you.   Have something planned to do right after eating or having a cup of coffee. For example, plan to take a walk or exercise.   Try a relaxation exercise to calm you down and decrease your stress. Remember, you may be tense and nervous for the first 2 weeks after you quit, but this will pass.   Find new activities to keep your hands busy. Play with a pen, coin, or rubber band. Doodle or draw things on paper.   Brush your teeth right after eating. This will help cut down on the craving for the taste of tobacco after meals. You can also try  mouthwash.   Use oral substitutes in place of cigarettes. Try using lemon drops, carrots, cinnamon sticks, or chewing gum. Keep them handy so they are available when you have the urge to smoke.   When you have the urge to smoke, try deep breathing.   Designate your home as a nonsmoking area.   If you are a heavy smoker, ask your health care provider about a prescription for nicotine chewing gum. It can ease your withdrawal from nicotine.   Reward yourself. Set aside the cigarette money you save and buy yourself something nice.   Look for support from others. Join a support group or smoking cessation program. Ask someone at home or at work to help you with your plan to quit smoking.   Always ask yourself, "Do I need this cigarette or is this just a reflex?" Tell yourself, "Today, I choose not to smoke," or "I do not want to smoke." You are reminding yourself of your decision to quit.  Do not replace cigarette smoking with electronic cigarettes (commonly called e-cigarettes). The safety of e-cigarettes is unknown, and some may contain harmful chemicals.  If you relapse, do not give up! Plan ahead and think about what you will do the next time you get the urge to smoke.  HOW WILL I FEEL WHEN I QUIT SMOKING? You may have symptoms of withdrawal because your body is used to nicotine (the addictive substance in cigarettes). You may crave cigarettes, be irritable, feel very hungry, cough often, get headaches, or have difficulty concentrating. The withdrawal symptoms are only temporary. They are strongest when you first quit but will go away within 10-14 days. When withdrawal symptoms occur, stay in control. Think about your reasons for quitting. Remind yourself that these are signs that your body is healing and getting used to being without cigarettes. Remember that withdrawal symptoms are easier to treat than the major diseases that smoking can cause.  Even after the withdrawal is over, expect  periodic urges to smoke. However, these cravings are generally short lived and will go away whether you smoke or not. Do not smoke!  WHAT RESOURCES ARE AVAILABLE TO HELP ME QUIT SMOKING? Your health care provider can direct you to community resources or hospitals for support, which may include:  Group support.  Education.  Hypnosis.  Therapy. Document Released: 08/30/2004 Document Revised: 09/22/2013 Document Reviewed: 05/20/2013 [facility] Patient Information 2015 [place], [place]. This information is not intended to replace advice given to you by your health care provider. Make sure you discuss any questions you have with your health care provider.  You Can Quit Smoking If you are ready to quit smoking or are thinking about it, congratulations! You have chosen to help yourself be healthier and live longer! There are lots of different ways to quit smoking. Nicotine gum, nicotine patches, a nicotine inhaler, or nicotine nasal spray can help with physical craving. Hypnosis, support groups, and medicines help break the habit of smoking. TIPS TO GET OFF AND STAY OFF CIGARETTES  Learn to predict your moods. Do not let a bad situation be your excuse to have a cigarette. Some situations in your life might tempt you to have a cigarette.  Ask friends and co-workers not to smoke around you.  Make your home smoke-free.  Never have "just one" cigarette. It leads to wanting another and another. Remind yourself of your decision to quit.  On a card, make a list of your reasons for not smoking. Read it at least the same number of times a day as you have a cigarette. Tell yourself everyday, "I do not want to smoke. I choose not to smoke."  Ask someone at home or work to help you with your plan to quit smoking.  Have something planned after you eat or have a cup of coffee. Take a walk or get other exercise to perk you up. This will help to keep you from overeating.  Try a relaxation exercise to calm you  down and decrease your stress. Remember, you may be tense and nervous the first two weeks after you quit. This will pass.  Find new activities to keep your hands busy. Play with a pen, coin, or rubber band. Doodle or draw things on paper.  Brush your teeth right after eating. This will help cut down the craving for the taste of tobacco after meals. You can try mouthwash too.  Try gum, breath mints, or diet candy to keep something in your mouth. IF YOU SMOKE AND WANT TO QUIT:  Do not stock up on cigarettes. Never buy a carton. Wait until one pack is finished before you buy another.  Never carry cigarettes with you at work or at home.  Keep cigarettes as far away from you as possible. Leave them with someone else.  Never carry matches or a lighter with you.  Ask yourself, "Do I need this cigarette or is this just a reflex?"  Bet with someone that you can quit. Put cigarette money in a piggy bank every morning. If you smoke, you give up the money. If you do not smoke, by the end of the week, you keep the money.  Keep trying. It takes 21 days to change a habit!  Talk to your doctor about using medicines to help you quit. These include nicotine replacement gum, lozenges, or skin patches. Document Released: 09/28/2009 Document Revised: 02/24/2012 Document Reviewed: 09/28/2009 [facility] Patient Information 2015 [place], [place]. This information is not intended to replace advice given to you by your health care provider. Make sure you discuss any questions you have with your health care provider.

## 2014-06-11 LAB — VITAMIN D 25 HYDROXY (VIT D DEFICIENCY, FRACTURES): VIT D 25 HYDROXY: 98 ng/mL — AB (ref 30–89)

## 2014-06-11 LAB — INSULIN, FASTING: Insulin fasting, serum: 12 u[IU]/mL (ref 3–28)

## 2014-09-16 ENCOUNTER — Ambulatory Visit: Payer: Self-pay | Admitting: Physician Assistant

## 2014-10-31 ENCOUNTER — Other Ambulatory Visit: Payer: Self-pay | Admitting: Physician Assistant

## 2014-12-12 ENCOUNTER — Encounter: Payer: Self-pay | Admitting: Internal Medicine

## 2015-01-20 ENCOUNTER — Other Ambulatory Visit: Payer: Self-pay | Admitting: Physician Assistant

## 2015-02-14 ENCOUNTER — Ambulatory Visit (INDEPENDENT_AMBULATORY_CARE_PROVIDER_SITE_OTHER): Payer: BLUE CROSS/BLUE SHIELD | Admitting: Internal Medicine

## 2015-02-14 ENCOUNTER — Encounter: Payer: Self-pay | Admitting: Internal Medicine

## 2015-02-14 VITALS — BP 156/94 | HR 72 | Temp 97.7°F | Resp 16 | Ht 69.5 in | Wt 256.4 lb

## 2015-02-14 DIAGNOSIS — Z23 Encounter for immunization: Secondary | ICD-10-CM

## 2015-02-14 DIAGNOSIS — Z79899 Other long term (current) drug therapy: Secondary | ICD-10-CM

## 2015-02-14 DIAGNOSIS — Z125 Encounter for screening for malignant neoplasm of prostate: Secondary | ICD-10-CM

## 2015-02-14 DIAGNOSIS — R5383 Other fatigue: Secondary | ICD-10-CM

## 2015-02-14 DIAGNOSIS — Z1212 Encounter for screening for malignant neoplasm of rectum: Secondary | ICD-10-CM

## 2015-02-14 DIAGNOSIS — E782 Mixed hyperlipidemia: Secondary | ICD-10-CM

## 2015-02-14 DIAGNOSIS — I10 Essential (primary) hypertension: Secondary | ICD-10-CM

## 2015-02-14 DIAGNOSIS — E559 Vitamin D deficiency, unspecified: Secondary | ICD-10-CM

## 2015-02-14 DIAGNOSIS — R7309 Other abnormal glucose: Secondary | ICD-10-CM

## 2015-02-14 LAB — CBC WITH DIFFERENTIAL/PLATELET
BASOS ABS: 0 10*3/uL (ref 0.0–0.1)
Basophils Relative: 0 % (ref 0–1)
EOS PCT: 1 % (ref 0–5)
Eosinophils Absolute: 0.1 10*3/uL (ref 0.0–0.7)
HCT: 46 % (ref 39.0–52.0)
Hemoglobin: 15.7 g/dL (ref 13.0–17.0)
LYMPHS ABS: 2.2 10*3/uL (ref 0.7–4.0)
LYMPHS PCT: 30 % (ref 12–46)
MCH: 30.9 pg (ref 26.0–34.0)
MCHC: 34.1 g/dL (ref 30.0–36.0)
MCV: 90.6 fL (ref 78.0–100.0)
MONO ABS: 0.9 10*3/uL (ref 0.1–1.0)
MPV: 10 fL (ref 8.6–12.4)
Monocytes Relative: 13 % — ABNORMAL HIGH (ref 3–12)
NEUTROS ABS: 4.1 10*3/uL (ref 1.7–7.7)
Neutrophils Relative %: 56 % (ref 43–77)
PLATELETS: 236 10*3/uL (ref 150–400)
RBC: 5.08 MIL/uL (ref 4.22–5.81)
RDW: 13.6 % (ref 11.5–15.5)
WBC: 7.3 10*3/uL (ref 4.0–10.5)

## 2015-02-14 LAB — HEMOGLOBIN A1C
Hgb A1c MFr Bld: 6.6 % — ABNORMAL HIGH (ref <5.7)
MEAN PLASMA GLUCOSE: 143 mg/dL — AB (ref <117)

## 2015-02-14 NOTE — Progress Notes (Signed)
Patient ID: Aaron Burns, male   DOB: 1956/01/17, 59 y.o.   MRN: [id number] Annual Comprehensive Examination  This very nice 59 y.o. MWM presents for complete physical.  Patient has been followed for HTN, Prediabetes, Hyperlipidemia, and Vitamin D Deficiency.   HTN predates since 2008. Patient's BP has been controlled at home. Patient did have a negative Stress Myoview in 2008.Today's BP was elevated at 156/94 and confirmed. Patient denies any cardiac symptoms as chest pain, palpitations, shortness of breath, dizziness or ankle swelling.   Patient's hyperlipidemia is controlled with diet. Last lipids were at goal - Total Chol 117; HDL 33; LDL  51; Trig 164 on 06/10/2014.   Patient has prediabetes since 2009 with A1c 6.0% and patient denies reactive hypoglycemic symptoms, visual blurring, diabetic polys or paresthesias. Last A1c was  6.1% on  06/10/2014.   Finally, patient has history of Vitamin D Deficiency of 35 in 2008 and last vitamin D was 98 on 06/10/2014.  Medication Sig  . Cholecalciferol (VITAMIN D3 MAXIMUM STRENGTH) 5000 UNITS capsule Take 10,000 Units by mouth daily.   [name] CINNAMON PO Take 1,000 mg by mouth daily.  . hydrochlorothiazide (HYDRODIURIL) 25 MG tablet TAKE 1 TABLET (25 MG TOTAL) BY MOUTH DAILY.  [name] lisinopril (PRINIVIL,ZESTRIL) 10 MG tablet TAKE 1 TABLET (10 MG TOTAL) BY MOUTH DAILY.  . Multiple Vitamin (MULTIVITAMIN WITH MINERALS) TABS Take 1 tablet by mouth daily.  [name] OVER THE COUNTER MEDICATION Take 1 tablet by mouth daily. Magnesium with zinc  . OVER THE COUNTER MEDICATION Take 1 tablet by mouth daily. Balance B50  . phentermine (ADIPEX-P) 37.5 MG tablet Take 1/2 to 1 tablet every morning for dieting and weight loss  . varenicline (CHANTIX CONTINUING MONTH PAK) 1 MG tablet Take 1/2 to 1 pill 1 or 2 x daily as directed for smoking cessation  . vitamin C (ASCORBIC ACID) 500 MG tablet Take 1,000 mg by mouth daily.   Allergies  Allergen Reactions  . Ppd [Tuberculin Purified  Protein Derivative]    Past Medical History  Diagnosis Date  . Upper respiratory infection 2012    recently tx-no fever now-clearing up  . Flu 11/17/11    pt. states went to urgent care, had flu, took tamiflu, felt better in 2 days.  . Hypertension   . Heart murmur     at birth  . Chronic kidney disease 5/11    kidney stone  . Elevated hemoglobin A1c    Health Maintenance  Topic Date Due  . COLONOSCOPY  12/16/2004  . TETANUS/TDAP  12/16/2013  . INFLUENZA VACCINE  07/16/2014  . ZOSTAVAX  12/16/2014  . HIV Screening  Completed   Immunization History  Administered Date(s) Administered  . Influenza Split 12/01/2013  . Td 12/17/2003  . Tdap 02/14/2015   Past Surgical History  Procedure Laterality Date  . Hernia repair      as a baby  . Cystoscopy/retrograde/ureteroscopy/stone extraction with basket  5/11  . Wisdom tooth extraction    . Knee surgery  11/25/11  . Cholecystectomy  07/27/2012    Procedure: LAPAROSCOPIC CHOLECYSTECTOMY WITH INTRAOPERATIVE CHOLANGIOGRAM;  Surgeon: Joyice Faster. Cornett, MD;  Location: [facility];  Service: General;  Laterality: N/A;   Family History  Problem Relation Age of Onset  . Hypertension Mother   . Diabetes Mother   . Kidney failure Mother   . Diabetes Father   . Diabetes Sister   . Diabetes Brother     History   Social History  . Marital Status:  Married    Spouse Name: N/A  . Number of Children: N/A  . Years of Education: N/A   Occupational History  . Not on file.   Social History Main Topics  . Smoking status: Current Some Day Smoker -- 0.25 packs/day for 30 years    Types: Cigarettes  . Smokeless tobacco: Never Used     Comment: 2-3 cigs a day  . Alcohol Use: No     Comment: occasional  . Drug Use: No  . Sexual Activity: Not on file    ROS Constitutional: Denies fever, chills, weight loss/gain, headaches, insomnia, fatigue, night sweats or change in appetite. Eyes: Denies redness, blurred vision, diplopia, discharge, itchy  or watery eyes.  ENT: Denies discharge, congestion, post nasal drip, epistaxis, sore throat, earache, hearing loss, dental pain, Tinnitus, Vertigo, Sinus pain or snoring.  Cardio: Denies chest pain, palpitations, irregular heartbeat, syncope, dyspnea, diaphoresis, orthopnea, PND, claudication or edema Respiratory: denies cough, dyspnea, DOE, pleurisy, hoarseness, laryngitis or wheezing.  Gastrointestinal: Denies dysphagia, heartburn, reflux, water brash, pain, cramps, nausea, vomiting, bloating, diarrhea, constipation, hematemesis, melena, hematochezia, jaundice or hemorrhoids Genitourinary: Denies dysuria, frequency, urgency, nocturia, hesitancy, discharge, hematuria or flank pain Musculoskeletal: Denies arthralgia, myalgia, stiffness, Jt. Swelling, pain, limp or strain/sprain. Denies Falls. Skin: Denies puritis, rash, hives, warts, acne, eczema or change in skin lesion Neuro: No weakness, tremor, incoordination, spasms, paresthesia or pain Psychiatric: Denies confusion, memory loss or sensory loss. Denies Depression. Endocrine: Denies change in weight, skin, hair change, nocturia, and paresthesia, diabetic polys, visual blurring or hyper / hypo glycemic episodes.  Heme/Lymph: No excessive bleeding, bruising or enlarged lymph nodes.  Physical Exam  BP 156/94 and confirmed  Pulse 72  Temp 97.7 F   Resp 16  Ht 5' 9.5"  Wt 256 lb 6.4 oz     BMI 37.33   General Appearance: Well nourished, in no apparent distress. Eyes: PERRLA, EOMs, conjunctiva no swelling or erythema, normal fundi and vessels. Sinuses: No frontal/maxillary tenderness ENT/Mouth: EACs patent / TMs  nl. Nares clear without erythema, swelling, mucoid exudates. Oral hygiene is good. No erythema, swelling, or exudate. Tongue normal, non-obstructing. Tonsils not swollen or erythematous. Hearing normal.  Neck: Supple, thyroid normal. No bruits, nodes or JVD. Respiratory: Respiratory effort normal.  BS equal and clear bilateral  without rales, rhonci, wheezing or stridor. Cardio: Heart sounds are normal with regular rate and rhythm and no murmurs, rubs or gallops. Peripheral pulses are normal and equal bilaterally without edema. No aortic or femoral bruits. Chest: symmetric with normal excursions and percussion.  Abdomen: Flat, soft, with bowl sounds. Nontender, no guarding, rebound, hernias, masses, or organomegaly.  Lymphatics: Non tender without lymphadenopathy.  Genitourinary: No hernias.Testes nl. DRE - prostate nl for age - smooth & firm w/o nodules. Musculoskeletal: Full ROM all peripheral extremities, joint stability, 5/5 strength, and normal gait. Skin: Warm and dry without rashes, lesions, cyanosis, clubbing or  ecchymosis.  Neuro: Cranial nerves intact, reflexes equal bilaterally. Normal muscle tone, no cerebellar symptoms. Sensation intact.  Pysch: Awake and oriented X 3 with normal affect, insight and judgment appropriate.   Assessment and Plan  1. Essential hypertension  - New Rx Ziac 5/6.25 - Take 1 daily & advised closed BP monitoring  - Microalbumin / creatinine urine ratio - EKG 12-Lead - [place], RETROPERITNL ABD,  LTD - TSH  2. Mixed hyperlipidemia  - Lipid panel  3. Elevated hemoglobin A1c  - Hemoglobin A1c - Insulin, fasting  4. Vitamin D deficiency  - Vit  D  25 hydroxy (rtn osteoporosis monitoring)  5. Screening for rectal cancer  - POC Hemoccult Bld/Stl (3-Cd Home Screen); Future  6. Prostate cancer screening  - PSA  7. Other fatigue  - Vitamin B12 - Testosterone - Iron and TIBC - CBC with Differential/Platelet - TSH  8. Need for prophylactic vaccination with combined diphtheria-tetanus-pertussis (DTP) vaccine  - Tdap vaccine greater than or equal to 7yo IM  9. Medication management  - Urine Microscopic - CBC with Differential/Platelet - BASIC METABOLIC PANEL WITH GFR - Hepatic function panel - Magnesium   Continue prudent diet as discussed, weight control, BP  monitoring, regular exercise, and medications as discussed.  Discussed med effects and SE's. Routine screening labs and tests as requested with regular follow-up as recommended.

## 2015-02-14 NOTE — Patient Instructions (Signed)

## 2015-02-15 LAB — LIPID PANEL
CHOL/HDL RATIO: 3.8 ratio
CHOLESTEROL: 115 mg/dL (ref 0–200)
HDL: 30 mg/dL — ABNORMAL LOW (ref >=40)
LDL Cholesterol: 57 mg/dL (ref 0–99)
TRIGLYCERIDES: 140 mg/dL (ref <150)
VLDL: 28 mg/dL (ref 0–40)

## 2015-02-15 LAB — VITAMIN D 25 HYDROXY (VIT D DEFICIENCY, FRACTURES): VIT D 25 HYDROXY: 56 ng/mL (ref 30–100)

## 2015-02-15 LAB — BASIC METABOLIC PANEL WITH GFR
BUN: 17 mg/dL (ref 6–23)
CHLORIDE: 101 meq/L (ref 96–112)
CO2: 25 mEq/L (ref 19–32)
CREATININE: 0.82 mg/dL (ref 0.50–1.35)
Calcium: 9.9 mg/dL (ref 8.4–10.5)
Glucose, Bld: 102 mg/dL — ABNORMAL HIGH (ref 70–99)
POTASSIUM: 3.9 meq/L (ref 3.5–5.3)
Sodium: 137 mEq/L (ref 135–145)

## 2015-02-15 LAB — URINALYSIS, MICROSCOPIC ONLY
Bacteria, UA: NONE SEEN
Casts: NONE SEEN
Crystals: NONE SEEN
SQUAMOUS EPITHELIAL / LPF: NONE SEEN

## 2015-02-15 LAB — TSH: TSH: 2.213 u[IU]/mL (ref 0.350–4.500)

## 2015-02-15 LAB — MICROALBUMIN / CREATININE URINE RATIO
CREATININE, URINE: 154 mg/dL
MICROALB/CREAT RATIO: 11 mg/g (ref 0.0–30.0)
Microalb, Ur: 1.7 mg/dL (ref <2.0)

## 2015-02-15 LAB — VITAMIN B12: Vitamin B-12: 957 pg/mL — ABNORMAL HIGH (ref 211–911)

## 2015-02-15 LAB — IRON AND TIBC
%SAT: 15 % — AB (ref 20–55)
Iron: 52 ug/dL (ref 42–165)
TIBC: 340 ug/dL (ref 215–435)
UIBC: 288 ug/dL (ref 125–400)

## 2015-02-15 LAB — MAGNESIUM: Magnesium: 1.8 mg/dL (ref 1.5–2.5)

## 2015-02-15 LAB — TESTOSTERONE: Testosterone: 345 ng/dL (ref 300–890)

## 2015-02-15 LAB — HEPATIC FUNCTION PANEL
ALT: 60 U/L — ABNORMAL HIGH (ref 0–53)
AST: 36 U/L (ref 0–37)
Albumin: 4.5 g/dL (ref 3.5–5.2)
Alkaline Phosphatase: 67 U/L (ref 39–117)
BILIRUBIN DIRECT: 0.2 mg/dL (ref 0.0–0.3)
BILIRUBIN INDIRECT: 0.4 mg/dL (ref 0.2–1.2)
BILIRUBIN TOTAL: 0.6 mg/dL (ref 0.2–1.2)
TOTAL PROTEIN: 6.8 g/dL (ref 6.0–8.3)

## 2015-02-15 LAB — INSULIN, FASTING: Insulin fasting, serum: 8.2 u[IU]/mL (ref 2.0–19.6)

## 2015-02-15 LAB — PSA: PSA: 0.63 ng/mL (ref <=4.00)

## 2015-05-16 ENCOUNTER — Other Ambulatory Visit: Payer: Self-pay | Admitting: Internal Medicine

## 2015-05-30 ENCOUNTER — Ambulatory Visit (INDEPENDENT_AMBULATORY_CARE_PROVIDER_SITE_OTHER): Payer: BLUE CROSS/BLUE SHIELD | Admitting: Internal Medicine

## 2015-05-30 ENCOUNTER — Encounter: Payer: Self-pay | Admitting: Internal Medicine

## 2015-05-30 ENCOUNTER — Other Ambulatory Visit (HOSPITAL_COMMUNITY): Payer: Self-pay | Admitting: *Deleted

## 2015-05-30 VITALS — BP 122/70 | HR 64 | Temp 98.2°F | Resp 18 | Ht 69.5 in | Wt 234.0 lb

## 2015-05-30 DIAGNOSIS — E559 Vitamin D deficiency, unspecified: Secondary | ICD-10-CM

## 2015-05-30 DIAGNOSIS — Z79899 Other long term (current) drug therapy: Secondary | ICD-10-CM

## 2015-05-30 DIAGNOSIS — E782 Mixed hyperlipidemia: Secondary | ICD-10-CM

## 2015-05-30 DIAGNOSIS — I10 Essential (primary) hypertension: Secondary | ICD-10-CM

## 2015-05-30 DIAGNOSIS — R7309 Other abnormal glucose: Secondary | ICD-10-CM

## 2015-05-30 LAB — LIPID PANEL
CHOL/HDL RATIO: 3.3 ratio
Cholesterol: 131 mg/dL (ref 0–200)
HDL: 40 mg/dL (ref >=40)
LDL CALC: 62 mg/dL (ref 0–99)
Triglycerides: 146 mg/dL (ref <150)
VLDL: 29 mg/dL (ref 0–40)

## 2015-05-30 LAB — MAGNESIUM: Magnesium: 1.9 mg/dL (ref 1.5–2.5)

## 2015-05-30 LAB — TSH: TSH: 2.911 u[IU]/mL (ref 0.350–4.500)

## 2015-05-30 NOTE — H&P (Signed)
TOTAL KNEE ADMISSION H&P  Patient is being admitted for right total knee arthroplasty.  Subjective:  Chief Complaint:    Right knee primary OA / pain  HPI: Barbara Keng, 59 y.o. male, has a history of pain and functional disability in the right knee due to arthritis and has failed non-surgical conservative treatments for greater than 12 weeks to include NSAID's and/or analgesics, corticosteriod injections, viscosupplementation injections, use of assistive devices and activity modification.  Onset of symptoms was gradual, starting 3+ years ago with gradually worsening course since that time. The patient noted prior procedures on the knee to include  arthroscopy and menisectomy on the right knee(s).  Patient currently rates pain in the right knee(s) at 10 out of 10 with activity. Patient has night pain, worsening of pain with activity and weight bearing, pain that interferes with activities of daily living, pain with passive range of motion, crepitus and joint swelling.  Patient has evidence of periarticular osteophytes and joint space narrowing by imaging studies.  There is no active infection.  Risks, benefits and expectations were discussed with the patient.  Risks including but not limited to the risk of anesthesia, blood clots, nerve damage, blood vessel damage, failure of the prosthesis, infection and up to and including death.  Patient understand the risks, benefits and expectations and wishes to proceed with surgery.   PCP: Alesia Richards, MD  D/C Plans:      Home with HHPT/SNF  Post-op Meds:       No Rx given  Tranexamic Acid:      To be given - IV  Decadron:      Is to be given  FYI:     ASA post-op  Norco post-op    Patient Active Problem List   Diagnosis Date Noted  . Medication management 06/10/2014  . Vitamin D deficiency 11/30/2013  . Mixed hyperlipidemia 11/30/2013  . Hypertension   . Elevated hemoglobin A1c   . Gallstones 06/09/2012   Past Medical History   Diagnosis Date  . Upper respiratory infection 2012    recently tx-no fever now-clearing up  . Flu 11/17/11    pt. states went to urgent care, had flu, took tamiflu, felt better in 2 days.  . Hypertension   . Heart murmur     at birth  . Chronic kidney disease 5/11    kidney stone  . Elevated hemoglobin A1c     Past Surgical History  Procedure Laterality Date  . Hernia repair      as a baby  . Cystoscopy/retrograde/ureteroscopy/stone extraction with basket  5/11  . Wisdom tooth extraction    . Knee surgery  11/25/11  . Cholecystectomy  07/27/2012    Procedure: LAPAROSCOPIC CHOLECYSTECTOMY WITH INTRAOPERATIVE CHOLANGIOGRAM;  Surgeon: Joyice Faster. Cornett, MD;  Location: [facility];  Service: General;  Laterality: N/A;    No prescriptions prior to admission   Allergies  Allergen Reactions  . Ppd [Tuberculin Purified Protein Derivative]     Pt states he is not allergic to this..    History  Substance Use Topics  . Smoking status: Former Smoker -- 30 years    Types: Cigarettes  . Smokeless tobacco: Former Systems developer    Quit date: 08/16/2014     Comment: 2-3 cigs a day  . Alcohol Use: No     Comment: occasional    Family History  Problem Relation Age of Onset  . Hypertension Mother   . Diabetes Mother   . Kidney failure Mother   .  Diabetes Father   . Diabetes Sister   . Diabetes Brother      Review of Systems  Constitutional: Negative.   HENT: Negative.   Eyes: Negative.   Respiratory: Negative.   Cardiovascular: Negative.   Gastrointestinal: Negative.   Genitourinary: Negative.   Musculoskeletal: Positive for joint pain.  Skin: Negative.   Neurological: Negative.   Endo/Heme/Allergies: Negative.   Psychiatric/Behavioral: Negative.     Objective:  Physical Exam  Constitutional: He is oriented to person, place, and time. He appears well-developed and well-nourished.  HENT:  Head: Normocephalic and atraumatic.  Eyes: Pupils are equal, round, and reactive to light.   Neck: Neck supple. No JVD present. No tracheal deviation present. No thyromegaly present.  Cardiovascular: Normal rate, regular rhythm, normal heart sounds and intact distal pulses.   Respiratory: Effort normal and breath sounds normal. No stridor. No respiratory distress. He has no wheezes.  GI: Soft. There is no tenderness. There is no guarding.  Musculoskeletal:       Right knee: He exhibits decreased range of motion, swelling and bony tenderness. He exhibits no ecchymosis, no deformity, no laceration and no erythema. Tenderness found.  Lymphadenopathy:    He has no cervical adenopathy.  Neurological: He is alert and oriented to person, place, and time.  Skin: Skin is warm and dry.  Psychiatric: He has a normal mood and affect.    Vital signs in last 24 hours: Temp:  [98.2 F (36.8 C)] 98.2 F (36.8 C) (06/14 0834) Pulse Rate:  [64] 64 (06/14 0834) Resp:  [18] 18 (06/14 0834) BP: (122)/(70) 122/70 mmHg (06/14 0834) Weight:  [106.142 kg (234 lb)] 106.142 kg (234 lb) (06/14 0834)  Labs:   Estimated body mass index is 37.33 kg/(m^2) as calculated from the following:   Height as of 02/14/15: 5' 9.5" (1.765 m).   Weight as of 02/14/15: 116.302 kg (256 lb 6.4 oz).   Imaging Review Plain radiographs demonstrate severe degenerative joint disease of the right knee(s). The bone quality appears to be good for age and reported activity level.  Assessment/Plan:  End stage arthritis, right knee   The patient history, physical examination, clinical judgment of the provider and imaging studies are consistent with end stage degenerative joint disease of the right knee(s) and total knee arthroplasty is deemed medically necessary. The treatment options including medical management, injection therapy arthroscopy and arthroplasty were discussed at length. The risks and benefits of total knee arthroplasty were presented and reviewed. The risks due to aseptic loosening, infection, stiffness, patella  tracking problems, thromboembolic complications and other imponderables were discussed. The patient acknowledged the explanation, agreed to proceed with the plan and consent was signed. Patient is being admitted for inpatient treatment for surgery, pain control, PT, OT, prophylactic antibiotics, VTE prophylaxis, progressive ambulation and ADL's and discharge planning. The patient is planning to be discharged home with home health services vs skilled nursing facility.     West Pugh Geneieve Duell   PA-C  05/30/2015, 12:31 PM

## 2015-05-30 NOTE — Progress Notes (Signed)
MEDICAL [facility] NOTE Taylor PA 05-30-15 EPIC EKG 02-14-15 EPIC

## 2015-05-30 NOTE — Patient Instructions (Signed)
Aaron Burns  05/30/2015   Your procedure is scheduled on: 06-06-15  Report to [facility] Main  Entrance and follow signs to               [place] at 955 AM.  Call this number if you have problems the morning of surgery [phone]   Remember: ONLY 1 PERSON MAY GO WITH YOU TO SHORT STAY TO GET  READY MORNING OF [place].  Do not eat food or drink liquids :After Midnight.     Take these medicines the morning of surgery with A SIP OF WATER: none                               You may not have any metal on your body including hair pins and              piercings  Do not wear jewelry, make-up, lotions, powders or perfumes, deodorant             Do not wear nail polish.  Do not shave  48 hours prior to surgery.              Men may shave face and neck.   Do not bring valuables to the hospital. [place].  Contacts, dentures or bridgework may not be worn into surgery.  Leave suitcase in the car. After surgery it may be brought to your room.     Patients discharged the day of surgery will not be allowed to drive home.  Name and phone number of your driver:  Special Instructions: N/A              Please read over the following fact sheets you were given: _____________________________________________________________________             [facility] - Preparing for Surgery Before surgery, you can play an important role.  Because skin is not sterile, your skin needs to be as free of germs as possible.  You can reduce the number of germs on your skin by washing with CHG (chlorahexidine gluconate) soap before surgery.  CHG is an antiseptic cleaner which kills germs and bonds with the skin to continue killing germs even after washing. Please DO NOT use if you have an allergy to CHG or antibacterial soaps.  If your skin becomes reddened/irritated stop using the CHG and inform your nurse when you arrive at Short  Stay. Do not shave (including legs and underarms) for at least 48 hours prior to the first CHG shower.  You may shave your face/neck. Please follow these instructions carefully:  1.  Shower with CHG Soap the night before surgery and the  morning of Surgery.  2.  If you choose to wash your hair, wash your hair first as usual with your  normal  shampoo.  3.  After you shampoo, rinse your hair and body thoroughly to remove the  shampoo.                           4.  Use CHG as you would any other liquid soap.  You can apply chg directly  to the skin and wash  Gently with a scrungie or clean washcloth.  5.  Apply the CHG Soap to your body ONLY FROM THE NECK DOWN.   Do not use on face/ open                           Wound or open sores. Avoid contact with eyes, ears mouth and genitals (private parts).                       Wash face,  Genitals (private parts) with your normal soap.             6.  Wash thoroughly, paying special attention to the area where your surgery  will be performed.  7.  Thoroughly rinse your body with warm water from the neck down.  8.  DO NOT shower/wash with your normal soap after using and rinsing off  the CHG Soap.                9.  Pat yourself dry with a clean towel.            10.  Wear clean pajamas.            11.  Place clean sheets on your bed the night of your first shower and do not  sleep with pets. Day of Surgery : Do not apply any lotions/deodorants the morning of surgery.  Please wear clean clothes to the hospital/surgery center.  FAILURE TO FOLLOW THESE INSTRUCTIONS MAY RESULT IN THE CANCELLATION OF YOUR SURGERY PATIENT SIGNATURE_________________________________  NURSE SIGNATURE__________________________________  ________________________________________________________________________   Adam Phenix  An incentive spirometer is a tool that can help keep your lungs clear and active. This tool measures how well you are  filling your lungs with each breath. Taking long deep breaths may help reverse or decrease the chance of developing breathing (pulmonary) problems (especially infection) following:  A long period of time when you are unable to move or be active. BEFORE THE PROCEDURE   If the spirometer includes an indicator to show your best effort, your nurse or respiratory therapist will set it to a desired goal.  If possible, sit up straight or lean slightly forward. Try not to slouch.  Hold the incentive spirometer in an upright position. INSTRUCTIONS FOR USE   Sit on the edge of your bed if possible, or sit up as far as you can in bed or on a chair.  Hold the incentive spirometer in an upright position.  Breathe out normally.  Place the mouthpiece in your mouth and seal your lips tightly around it.  Breathe in slowly and as deeply as possible, raising the piston or the ball toward the top of the column.  Hold your breath for 3-5 seconds or for as long as possible. Allow the piston or ball to fall to the bottom of the column.  Remove the mouthpiece from your mouth and breathe out normally.  Rest for a few seconds and repeat Steps 1 through 7 at least 10 times every 1-2 hours when you are awake. Take your time and take a few normal breaths between deep breaths.  The spirometer may include an indicator to show your best effort. Use the indicator as a goal to work toward during each repetition.  After each set of 10 deep breaths, practice coughing to be sure your lungs are clear. If you have an incision (the cut made at the time of surgery),  support your incision when coughing by placing a pillow or rolled up towels firmly against it. Once you are able to get out of bed, walk around indoors and cough well. You may stop using the incentive spirometer when instructed by your caregiver.  RISKS AND COMPLICATIONS  Take your time so you do not get dizzy or light-headed.  If you are in pain, you may  need to take or ask for pain medication before doing incentive spirometry. It is harder to take a deep breath if you are having pain. AFTER USE  Rest and breathe slowly and easily.  It can be helpful to keep track of a log of your progress. Your caregiver can provide you with a simple table to help with this. If you are using the spirometer at home, follow these instructions: [place] IF:   You are having difficultly using the spirometer.  You have trouble using the spirometer as often as instructed.  Your pain medication is not giving enough relief while using the spirometer.  You develop fever of 100.5 F (38.1 C) or higher. SEEK IMMEDIATE MEDICAL CARE IF:   You cough up bloody sputum that had not been present before.  You develop fever of 102 F (38.9 C) or greater.  You develop worsening pain at or near the incision site. MAKE SURE YOU:   Understand these instructions.  Will watch your condition.  Will get help right away if you are not doing well or get worse. Document Released: 04/14/2007 Document Revised: 02/24/2012 Document Reviewed: 06/15/2007 ExitCare Patient Information 2014 ExitCare, [place].   ________________________________________________________________________  WHAT IS A BLOOD TRANSFUSION? Blood Transfusion Information  A transfusion is the replacement of blood or some of its parts. Blood is made up of multiple cells which provide different functions.  Red blood cells carry oxygen and are used for blood loss replacement.  White blood cells fight against infection.  Platelets control bleeding.  Plasma helps clot blood.  Other blood products are available for specialized needs, such as hemophilia or other clotting disorders. BEFORE THE TRANSFUSION  Who gives blood for transfusions?   Healthy volunteers who are fully evaluated to make sure their blood is safe. This is blood bank blood. Transfusion therapy is the safest it has ever been in  the practice of medicine. Before blood is taken from a donor, a complete history is taken to make sure that person has no history of diseases nor engages in risky social behavior (examples are intravenous drug use or sexual activity with multiple partners). The donor's travel history is screened to minimize risk of transmitting infections, such as malaria. The donated blood is tested for signs of infectious diseases, such as HIV and hepatitis. The blood is then tested to be sure it is compatible with you in order to minimize the chance of a transfusion reaction. If you or a relative donates blood, this is often done in anticipation of surgery and is not appropriate for emergency situations. It takes many days to process the donated blood. RISKS AND COMPLICATIONS Although transfusion therapy is very safe and saves many lives, the main dangers of transfusion include:   Getting an infectious disease.  Developing a transfusion reaction. This is an allergic reaction to something in the blood you were given. Every precaution is taken to prevent this. The decision to have a blood transfusion has been considered carefully by your caregiver before blood is given. Blood is not given unless the benefits outweigh the risks. AFTER THE TRANSFUSION  Right after receiving a blood transfusion, you will usually feel much better and more energetic. This is especially true if your red blood cells have gotten low (anemic). The transfusion raises the level of the red blood cells which carry oxygen, and this usually causes an energy increase.  The nurse administering the transfusion will monitor you carefully for complications. HOME CARE INSTRUCTIONS  No special instructions are needed after a transfusion. You may find your energy is better. Speak with your caregiver about any limitations on activity for underlying diseases you may have. SEEK MEDICAL CARE IF:   Your condition is not improving after your  transfusion.  You develop redness or irritation at the intravenous (IV) site. SEEK IMMEDIATE MEDICAL CARE IF:  Any of the following symptoms occur over the next 12 hours:  Shaking chills.  You have a temperature by mouth above 102 F (38.9 C), not controlled by medicine.  Chest, back, or muscle pain.  People around you feel you are not acting correctly or are confused.  Shortness of breath or difficulty breathing.  Dizziness and fainting.  You get a rash or develop hives.  You have a decrease in urine output.  Your urine turns a dark color or changes to pink, red, or brown. Any of the following symptoms occur over the next 10 days:  You have a temperature by mouth above 102 F (38.9 C), not controlled by medicine.  Shortness of breath.  Weakness after normal activity.  The white part of the eye turns yellow (jaundice).  You have a decrease in the amount of urine or are urinating less often.  Your urine turns a dark color or changes to pink, red, or brown. Document Released: 11/29/2000 Document Revised: 02/24/2012 Document Reviewed: 07/18/2008 [facility] Patient Information 2014 [place], [place].  _______________________________________________________________________

## 2015-05-30 NOTE — Progress Notes (Signed)
Patient ID: Aaron Burns, male   DOB: 1956/12/09, 59 y.o.   MRN: [id number] Assessment and Plan:   1. Essential hypertension -monitor at home -cont meds -diet and exercise - TSH  2. Elevated hemoglobin A1c  - Hemoglobin A1c - Insulin, random  3. Vitamin D deficiency -cont supplement - Vit D  25 hydroxy (rtn osteoporosis monitoring)  4. Mixed hyperlipidemia -cont diet and exercise - Lipid panel  5. Medication management  - Magnesium  Defer other labs to surgical clearance visit.  Patient is surgically cleared at this time.  Consider doing low dose CT chest due to smoking history after surgery finished.  Form signed and will be faxed to Dr. Alvan Dame this afternoon.     Continue diet and meds as discussed. Further disposition pending results of labs.  HPI 59 y.o. male  presents for 3 month follow up with hypertension, hyperlipidemia, prediabetes and vitamin D.   His blood pressure has been controlled at home, today their BP is BP: 122/70 mmHg.   He does not workout. He denies chest pain, shortness of breath, dizziness.   He is not on cholesterol medication and denies myalgias. His cholesterol is at goal. The cholesterol last visit was:   Lab Results  Component Value Date   CHOL 115 02/14/2015   HDL 30* 02/14/2015   LDLCALC 57 02/14/2015   TRIG 140 02/14/2015   CHOLHDL 3.8 02/14/2015     He has been working on diet for prediabetes, and denies foot ulcerations, hyperglycemia, hypoglycemia , increased appetite, nausea, paresthesia of the feet, polydipsia, polyuria, visual disturbances, vomiting and weight loss. Last A1C in the office was:  Lab Results  Component Value Date   HGBA1C 6.6* 02/14/2015    Patient is on Vitamin D supplement.  Lab Results  Component Value Date   VD25OH 56 02/14/2015     Patient reports that he his to have right TKA replacement on 06/06/15.   He reports that he has no cardiac history.  He has never had chest pains in the past.  Patient reports that  he was a former smoker and that he smoked for approximately 35 years.  He has quit since then and has no difficulty with breathing.    Current Medications:  Current Outpatient Prescriptions on File Prior to Visit  Medication Sig Dispense Refill  . bisoprolol-hydrochlorothiazide (ZIAC) 5-6.25 MG per tablet Take 1 tablet by mouth every morning.    . Cholecalciferol (VITAMIN D3 MAXIMUM STRENGTH) 5000 UNITS capsule Take 10,000 Units by mouth daily.     [name] CINNAMON PO Take 1,000 mg by mouth daily.    [name] ibuprofen (ADVIL,MOTRIN) 200 MG tablet Take 400 mg by mouth every 6 (six) hours as needed for headache or moderate pain.    . Multiple Vitamin (MULTIVITAMIN WITH MINERALS) TABS Take 1 tablet by mouth daily.    [name] OVER THE COUNTER MEDICATION Take 1 tablet by mouth daily. Magnesium with zinc    . OVER THE COUNTER MEDICATION Take 1 tablet by mouth daily. Balance B50    . vitamin C (ASCORBIC ACID) 500 MG tablet Take 1,000 mg by mouth daily.     No current facility-administered medications on file prior to visit.    Medical History:  Past Medical History  Diagnosis Date  . Upper respiratory infection 2012    recently tx-no fever now-clearing up  . Flu 11/17/11    pt. states went to urgent care, had flu, took tamiflu, felt better in 2 days.  . Hypertension   .  Heart murmur     at birth  . Chronic kidney disease 5/11    kidney stone  . Elevated hemoglobin A1c     Allergies:  Allergies  Allergen Reactions  . Ppd [Tuberculin Purified Protein Derivative]     Pt states he is not allergic to this..     Review of Systems:  Review of Systems  Constitutional: Negative for fever, chills and malaise/fatigue.  HENT: Negative for congestion, nosebleeds and sore throat.   Eyes: Negative.   Respiratory: Negative for cough, shortness of breath and wheezing.   Cardiovascular: Negative for chest pain, palpitations and leg swelling.  Gastrointestinal: Negative for heartburn, nausea, vomiting, abdominal  pain, diarrhea, constipation, blood in stool and melena.  Genitourinary: Negative for dysuria, urgency, frequency and hematuria.  Musculoskeletal: Positive for joint pain. Negative for myalgias, back pain, falls and neck pain.  Skin: Negative.   Neurological: Negative for dizziness, tingling, sensory change, loss of consciousness and headaches.  Psychiatric/Behavioral: Negative for depression. The patient is not nervous/anxious and does not have insomnia.   All other systems reviewed and are negative.   Family history- Review and unchanged  Social history- Review and unchanged  Physical Exam: BP 122/70 mmHg  Pulse 64  Temp(Src) 98.2 F (36.8 C) (Temporal)  Resp 18  Ht 5' 9.5" (1.765 m)  Wt 234 lb (106.142 kg)  BMI 34.07 kg/m2 Wt Readings from Last 3 Encounters:  05/30/15 234 lb (106.142 kg)  02/14/15 256 lb 6.4 oz (116.302 kg)  06/10/14 235 lb 12.8 oz (106.958 kg)    General Appearance: Well nourished well developed, in no apparent distress. Eyes: PERRLA, EOMs, conjunctiva no swelling or erythema ENT/Mouth: Ear canals normal without obstruction, swelling, erythma, discharge.  TMs normal bilaterally.  Oropharynx moist, clear, without exudate, or postoropharyngeal swelling. Neck: Supple, thyroid normal,no cervical adenopathy  Respiratory: Respiratory effort normal, Breath sounds clear A&P without rhonchi, wheeze, or rale.  No retractions, no accessory usage. Cardio: RRR with no RGs. Soft 2/6 murmur with soft click.  Brisk peripheral pulses without edema.  Abdomen: Soft, + BS,  Non tender, no guarding, rebound, hernias, masses. Musculoskeletal: Full ROM, 5/5 strength, Antalgic gait Skin: Warm, dry without rashes, lesions, ecchymosis.  Neuro: Awake and oriented X 3, Cranial nerves intact. Normal muscle tone, no cerebellar symptoms. Psych: Normal affect, Insight and Judgment appropriate.    FORCUCCI, Kelon Easom, PA-C 8:49 AM [facility]

## 2015-05-30 NOTE — Patient Instructions (Signed)
Total Knee Replacement °Total knee replacement is a procedure to replace your knee joint with an artificial knee joint (prosthetic knee joint). The purpose of this surgery is to reduce pain and improve your knee function. °LET YOUR HEALTH CARE PROVIDER KNOW ABOUT:  °· Any allergies you have. °· All medicines you are taking, including vitamins, herbs, eye drops, creams, and over-the-counter medicines. °· Previous problems you or members of your family have had with the use of anesthetics. °· Any blood disorders you have. °· Previous surgeries you have had. °· Medical conditions you have. °RISKS AND COMPLICATIONS  °Generally, total knee replacement is a safe procedure. However, problems can occur and include: °· Loss of range of motion of the knee or instability. °· Loosening of the prosthesis. °· Infection. °· Persistent pain. °BEFORE THE PROCEDURE  °· Do not eat or drink anything after midnight on the night before the procedure or as directed by your health care provider. °· Ask your health care provider about changing or stopping your regular medicines. This is especially important if you are taking diabetes medicines or blood thinners. °PROCEDURE  °· Just before the procedure, you will receive medicine that will make you drowsy (sedative). This will be given through a tube that is inserted into one of your veins (IV tube). °· Then you will be given one of the following: °¨ A medicine injected into your spine that numbs your body below the waist (spinal anesthetic). °¨ A medicine that makes you fall asleep (general anesthetic). °¨ You may also receive medicine to block feeling in your leg (nerve block) to help ease pain after surgery. °· An incision will be made in your knee. Your surgeon will take out any damaged cartilage and bone by sawing off the damaged surfaces. °· The surgeon will then put a new metal liner over the sawed-off portion of your thigh bone (femur) and a plastic liner over the sawed-off portion  of one of the bones of your lower leg (tibia). This is to restore alignment and function to your knee. A plastic piece is often used to restore the surface of your knee cap. °AFTER THE PROCEDURE  °· You will be taken to the recovery area. °· You may have drainage tubes to drain excess fluid from your knee. These tubes attach to a device that removes these fluids. °· Once you are awake, stable, and taking fluids well, you will be taken to your hospital room. °· You will receive physical therapy as prescribed by your health care provider. °· Your surgeon may recommend that you spend time (usually an additional 10-14 days) in an extended-care facility to help you begin walking again and improve your range of motion before you go home. °· You may also be prescribed blood-thinning medicine to decrease your risk of developing blood clots in your leg. °Document Released: 03/10/2001 Document Revised: 04/18/2014 Document Reviewed: 01/12/2012 °ExitCare® Patient Information ©2015 [employer]. This information is not intended to replace advice given to you by your health care provider. Make sure you discuss any questions you have with your health care provider. ° °

## 2015-05-31 ENCOUNTER — Encounter (HOSPITAL_COMMUNITY): Payer: Self-pay

## 2015-05-31 ENCOUNTER — Encounter (HOSPITAL_COMMUNITY)
Admission: RE | Admit: 2015-05-31 | Discharge: 2015-05-31 | Disposition: A | Discharge status: Home / Self Care | Payer: BLUE CROSS/BLUE SHIELD | Source: Ambulatory Visit | Attending: Orthopedic Surgery | Admitting: Orthopedic Surgery

## 2015-05-31 DIAGNOSIS — Z01818 Encounter for other preprocedural examination: Secondary | ICD-10-CM | POA: Insufficient documentation

## 2015-05-31 DIAGNOSIS — M179 Osteoarthritis of knee, unspecified: Secondary | ICD-10-CM | POA: Diagnosis not present

## 2015-05-31 HISTORY — DX: Nonspecific reaction to tuberculin skin test without active tuberculosis: R76.11

## 2015-05-31 HISTORY — DX: Personal history of urinary calculi: Z87.442

## 2015-05-31 LAB — URINALYSIS, ROUTINE W REFLEX MICROSCOPIC
Bilirubin Urine: NEGATIVE
Glucose, UA: NEGATIVE mg/dL
Hgb urine dipstick: NEGATIVE
Ketones, ur: NEGATIVE mg/dL
LEUKOCYTES UA: NEGATIVE
Nitrite: NEGATIVE
Protein, ur: NEGATIVE mg/dL
Specific Gravity, Urine: 1.018 (ref 1.005–1.030)
Urobilinogen, UA: 0.2 mg/dL (ref 0.0–1.0)
pH: 6 (ref 5.0–8.0)

## 2015-05-31 LAB — CBC
HCT: 50.1 % (ref 39.0–52.0)
HEMOGLOBIN: 16.5 g/dL (ref 13.0–17.0)
MCH: 30.9 pg (ref 26.0–34.0)
MCHC: 32.9 g/dL (ref 30.0–36.0)
MCV: 93.8 fL (ref 78.0–100.0)
PLATELETS: 206 10*3/uL (ref 150–400)
RBC: 5.34 MIL/uL (ref 4.22–5.81)
RDW: 14.5 % (ref 11.5–15.5)
WBC: 8.9 10*3/uL (ref 4.0–10.5)

## 2015-05-31 LAB — HEMOGLOBIN A1C
Hgb A1c MFr Bld: 6 % — ABNORMAL HIGH (ref <5.7)
MEAN PLASMA GLUCOSE: 126 mg/dL — AB (ref <117)

## 2015-05-31 LAB — BASIC METABOLIC PANEL
ANION GAP: 10 (ref 5–15)
BUN: 27 mg/dL — ABNORMAL HIGH (ref 6–20)
CO2: 31 mmol/L (ref 22–32)
CREATININE: 0.74 mg/dL (ref 0.61–1.24)
Calcium: 10.2 mg/dL (ref 8.9–10.3)
Chloride: 101 mmol/L (ref 101–111)
GFR calc non Af Amer: >60 mL/min (ref >60)
Glucose, Bld: 75 mg/dL (ref 65–99)
POTASSIUM: 4.3 mmol/L (ref 3.5–5.1)
Sodium: 142 mmol/L (ref 135–145)

## 2015-05-31 LAB — PROTIME-INR
INR: 0.95 (ref 0.00–1.49)
PROTHROMBIN TIME: 12.9 s (ref 11.6–15.2)

## 2015-05-31 LAB — INSULIN, RANDOM: Insulin: 72.5 u[IU]/mL — ABNORMAL HIGH (ref 2.0–19.6)

## 2015-05-31 LAB — APTT: aPTT: 30 seconds (ref 24–37)

## 2015-05-31 LAB — SURGICAL PCR SCREEN
MRSA, PCR: NEGATIVE
Staphylococcus aureus: NEGATIVE

## 2015-05-31 LAB — VITAMIN D 25 HYDROXY (VIT D DEFICIENCY, FRACTURES): Vit D, 25-Hydroxy: 78 ng/mL (ref 30–100)

## 2015-05-31 NOTE — Progress Notes (Signed)
   05/31/15 1312  OBSTRUCTIVE SLEEP APNEA  Have you ever been diagnosed with sleep apnea through a sleep study? No  Do you snore loudly (loud enough to be heard through closed doors)?  0  Do you often feel tired, fatigued, or sleepy during the daytime? 1  Has anyone observed you stop breathing during your sleep? 0  Do you have, or are you being treated for high blood pressure? 1  BMI more than 35 kg/m2? 0  Age over 60 years old? 1  Neck circumference greater than 40 cm/16 inches? 0  Gender: 1  Obstructive Sleep Apnea Score 4

## 2015-06-01 NOTE — Progress Notes (Signed)
bmet results faxed to dr Alvan Dame by epic

## 2015-06-06 ENCOUNTER — Inpatient Hospital Stay (HOSPITAL_COMMUNITY)
Admission: RE | Admit: 2015-06-06 | Discharge: 2015-06-08 | DRG: 470 | Disposition: A | Discharge status: Home Health | Payer: BLUE CROSS/BLUE SHIELD | Source: Ambulatory Visit | Attending: Orthopedic Surgery | Admitting: Orthopedic Surgery

## 2015-06-06 ENCOUNTER — Inpatient Hospital Stay (HOSPITAL_COMMUNITY): Payer: BLUE CROSS/BLUE SHIELD | Admitting: Anesthesiology

## 2015-06-06 ENCOUNTER — Encounter (HOSPITAL_COMMUNITY): Payer: Self-pay | Admitting: *Deleted

## 2015-06-06 ENCOUNTER — Encounter (HOSPITAL_COMMUNITY)
Admission: RE | Disposition: A | Discharge status: Home Health | Payer: Self-pay | Source: Ambulatory Visit | Attending: Orthopedic Surgery

## 2015-06-06 DIAGNOSIS — Z8249 Family history of ischemic heart disease and other diseases of the circulatory system: Secondary | ICD-10-CM | POA: Diagnosis not present

## 2015-06-06 DIAGNOSIS — Z833 Family history of diabetes mellitus: Secondary | ICD-10-CM | POA: Diagnosis not present

## 2015-06-06 DIAGNOSIS — Z6834 Body mass index (BMI) 34.0-34.9, adult: Secondary | ICD-10-CM

## 2015-06-06 DIAGNOSIS — Z01812 Encounter for preprocedural laboratory examination: Secondary | ICD-10-CM

## 2015-06-06 DIAGNOSIS — Z87891 Personal history of nicotine dependence: Secondary | ICD-10-CM

## 2015-06-06 DIAGNOSIS — Z96651 Presence of right artificial knee joint: Secondary | ICD-10-CM

## 2015-06-06 DIAGNOSIS — Z96659 Presence of unspecified artificial knee joint: Secondary | ICD-10-CM

## 2015-06-06 DIAGNOSIS — E669 Obesity, unspecified: Secondary | ICD-10-CM | POA: Diagnosis present

## 2015-06-06 DIAGNOSIS — M25561 Pain in right knee: Secondary | ICD-10-CM | POA: Diagnosis present

## 2015-06-06 DIAGNOSIS — Z87442 Personal history of urinary calculi: Secondary | ICD-10-CM

## 2015-06-06 DIAGNOSIS — I10 Essential (primary) hypertension: Secondary | ICD-10-CM | POA: Diagnosis present

## 2015-06-06 DIAGNOSIS — M1711 Unilateral primary osteoarthritis, right knee: Principal | ICD-10-CM | POA: Diagnosis present

## 2015-06-06 DIAGNOSIS — M659 Synovitis and tenosynovitis, unspecified: Secondary | ICD-10-CM | POA: Diagnosis present

## 2015-06-06 HISTORY — PX: TOTAL KNEE ARTHROPLASTY: SHX125

## 2015-06-06 LAB — TYPE AND SCREEN
ABO/RH(D): A NEG
Antibody Screen: NEGATIVE

## 2015-06-06 LAB — ABO/RH: ABO/RH(D): A NEG

## 2015-06-06 SURGERY — ARTHROPLASTY, KNEE, TOTAL
Anesthesia: Spinal | Site: Knee | Laterality: Right

## 2015-06-06 MED ORDER — FERROUS SULFATE 325 (65 FE) MG PO TABS
325.0000 mg | ORAL_TABLET | Freq: Three times a day (TID) | ORAL | Status: DC
  Administered 2015-06-06 – 2015-06-08 (×5): 325 mg via ORAL
  Filled 2015-06-06 (×8): qty 1

## 2015-06-06 MED ORDER — LACTATED RINGERS IV SOLN
INTRAVENOUS | Status: DC
  Administered 2015-06-06: 1000 mL via INTRAVENOUS
  Administered 2015-06-06 (×2): via INTRAVENOUS

## 2015-06-06 MED ORDER — ONDANSETRON HCL 4 MG/2ML IJ SOLN
INTRAMUSCULAR | Status: DC | PRN
  Administered 2015-06-06: 4 mg via INTRAVENOUS

## 2015-06-06 MED ORDER — CEFAZOLIN SODIUM-DEXTROSE 2-3 GM-% IV SOLR
2.0000 g | INTRAVENOUS | Status: AC
  Administered 2015-06-06: 2 g via INTRAVENOUS

## 2015-06-06 MED ORDER — BUPIVACAINE HCL (PF) 0.75 % IJ SOLN
INTRAMUSCULAR | Status: DC | PRN
  Administered 2015-06-06: 15 mg

## 2015-06-06 MED ORDER — CEFAZOLIN SODIUM-DEXTROSE 2-3 GM-% IV SOLR
INTRAVENOUS | Status: AC
  Filled 2015-06-06: qty 50

## 2015-06-06 MED ORDER — MIDAZOLAM HCL 5 MG/5ML IJ SOLN
INTRAMUSCULAR | Status: DC | PRN
  Administered 2015-06-06: 2 mg via INTRAVENOUS

## 2015-06-06 MED ORDER — SODIUM CHLORIDE 0.9 % IV SOLN
INTRAVENOUS | Status: DC
  Administered 2015-06-06: 19:00:00 via INTRAVENOUS
  Filled 2015-06-06 (×10): qty 1000

## 2015-06-06 MED ORDER — ASPIRIN EC 325 MG PO TBEC
325.0000 mg | DELAYED_RELEASE_TABLET | Freq: Two times a day (BID) | ORAL | Status: DC
Start: 2015-06-07 — End: 2015-06-08
  Administered 2015-06-07 – 2015-06-08 (×3): 325 mg via ORAL
  Filled 2015-06-06 (×6): qty 1

## 2015-06-06 MED ORDER — CEFAZOLIN SODIUM-DEXTROSE 2-3 GM-% IV SOLR
2.0000 g | Freq: Four times a day (QID) | INTRAVENOUS | Status: AC
  Administered 2015-06-06 (×2): 2 g via INTRAVENOUS
  Filled 2015-06-06 (×2): qty 50

## 2015-06-06 MED ORDER — ONDANSETRON HCL 4 MG/2ML IJ SOLN: 4.0000 mg | Freq: Four times a day (QID) | INTRAMUSCULAR | Status: DC | PRN

## 2015-06-06 MED ORDER — PROPOFOL INFUSION 10 MG/ML OPTIME
INTRAVENOUS | Status: DC | PRN
  Administered 2015-06-06: 70 ug/kg/min via INTRAVENOUS

## 2015-06-06 MED ORDER — PROPOFOL 10 MG/ML IV BOLUS
INTRAVENOUS | Status: AC
Start: 2015-06-06 — End: 2015-06-06
  Filled 2015-06-06: qty 20

## 2015-06-06 MED ORDER — MENTHOL 3 MG MT LOZG
1.0000 | LOZENGE | OROMUCOSAL | Status: DC | PRN
  Filled 2015-06-06: qty 9

## 2015-06-06 MED ORDER — SODIUM CHLORIDE 0.9 % IJ SOLN
INTRAMUSCULAR | Status: AC
  Filled 2015-06-06: qty 50

## 2015-06-06 MED ORDER — BISACODYL 10 MG RE SUPP: 10.0000 mg | Freq: Every day | RECTAL | Status: DC | PRN

## 2015-06-06 MED ORDER — DEXAMETHASONE SODIUM PHOSPHATE 10 MG/ML IJ SOLN
INTRAMUSCULAR | Status: AC
  Filled 2015-06-06: qty 1

## 2015-06-06 MED ORDER — FENTANYL CITRATE (PF) 100 MCG/2ML IJ SOLN
INTRAMUSCULAR | Status: AC
  Filled 2015-06-06: qty 2

## 2015-06-06 MED ORDER — FENTANYL CITRATE (PF) 100 MCG/2ML IJ SOLN
INTRAMUSCULAR | Status: DC | PRN
  Administered 2015-06-06: 100 ug via INTRAVENOUS

## 2015-06-06 MED ORDER — METOCLOPRAMIDE HCL 5 MG/ML IJ SOLN: 5.0000 mg | Freq: Three times a day (TID) | INTRAMUSCULAR | Status: DC | PRN

## 2015-06-06 MED ORDER — DEXAMETHASONE SODIUM PHOSPHATE 10 MG/ML IJ SOLN
10.0000 mg | Freq: Once | INTRAMUSCULAR | Status: AC
  Administered 2015-06-06: 10 mg via INTRAVENOUS

## 2015-06-06 MED ORDER — POLYETHYLENE GLYCOL 3350 17 G PO PACK
17.0000 g | PACK | Freq: Two times a day (BID) | ORAL | Status: DC
  Administered 2015-06-06 – 2015-06-08 (×4): 17 g via ORAL
  Filled 2015-06-06 (×3): qty 1

## 2015-06-06 MED ORDER — DIPHENHYDRAMINE HCL 25 MG PO CAPS: 25.0000 mg | ORAL_CAPSULE | Freq: Four times a day (QID) | ORAL | Status: DC | PRN

## 2015-06-06 MED ORDER — ALUM & MAG HYDROXIDE-SIMETH 200-200-20 MG/5ML PO SUSP: 30.0000 mL | ORAL | Status: DC | PRN

## 2015-06-06 MED ORDER — PROMETHAZINE HCL 25 MG/ML IJ SOLN: 6.2500 mg | INTRAMUSCULAR | Status: DC | PRN

## 2015-06-06 MED ORDER — SODIUM CHLORIDE 0.9 % IV SOLN
10000.0000 ug | INTRAVENOUS | Status: DC | PRN
  Administered 2015-06-06: 40 ug via INTRAVENOUS
  Administered 2015-06-06: 80 ug via INTRAVENOUS
  Administered 2015-06-06 (×2): 40 ug via INTRAVENOUS

## 2015-06-06 MED ORDER — METHOCARBAMOL 500 MG PO TABS
500.0000 mg | ORAL_TABLET | Freq: Four times a day (QID) | ORAL | Status: DC | PRN
  Administered 2015-06-07 – 2015-06-08 (×2): 500 mg via ORAL
  Filled 2015-06-06 (×3): qty 1

## 2015-06-06 MED ORDER — BISOPROLOL FUMARATE 5 MG PO TABS
5.0000 mg | ORAL_TABLET | Freq: Once | ORAL | Status: DC
  Filled 2015-06-06: qty 1

## 2015-06-06 MED ORDER — KETOROLAC TROMETHAMINE 30 MG/ML IJ SOLN
INTRAMUSCULAR | Status: AC
  Filled 2015-06-06: qty 1

## 2015-06-06 MED ORDER — CELECOXIB 200 MG PO CAPS
200.0000 mg | ORAL_CAPSULE | Freq: Two times a day (BID) | ORAL | Status: DC
  Administered 2015-06-06 – 2015-06-08 (×4): 200 mg via ORAL
  Filled 2015-06-06 (×6): qty 1

## 2015-06-06 MED ORDER — PHENOL 1.4 % MT LIQD
1.0000 | OROMUCOSAL | Status: DC | PRN
  Filled 2015-06-06: qty 177

## 2015-06-06 MED ORDER — BUPIVACAINE-EPINEPHRINE (PF) 0.25% -1:200000 IJ SOLN
INTRAMUSCULAR | Status: DC | PRN
  Administered 2015-06-06: 30 mL

## 2015-06-06 MED ORDER — MIDAZOLAM HCL 2 MG/2ML IJ SOLN
INTRAMUSCULAR | Status: AC
  Filled 2015-06-06: qty 2

## 2015-06-06 MED ORDER — HYDROMORPHONE HCL 1 MG/ML IJ SOLN
0.5000 mg | INTRAMUSCULAR | Status: DC | PRN
  Administered 2015-06-06 (×2): 0.5 mg via INTRAVENOUS
  Filled 2015-06-06: qty 1

## 2015-06-06 MED ORDER — KETOROLAC TROMETHAMINE 30 MG/ML IJ SOLN
INTRAMUSCULAR | Status: DC | PRN
  Administered 2015-06-06: 30 mg

## 2015-06-06 MED ORDER — PROPOFOL 10 MG/ML IV BOLUS
INTRAVENOUS | Status: AC
  Filled 2015-06-06: qty 20

## 2015-06-06 MED ORDER — HYDROCODONE-ACETAMINOPHEN 7.5-325 MG PO TABS
1.0000 | ORAL_TABLET | ORAL | Status: DC
  Administered 2015-06-06 – 2015-06-07 (×3): 2 via ORAL
  Administered 2015-06-07: 1 via ORAL
  Administered 2015-06-07 (×2): 2 via ORAL
  Administered 2015-06-07 – 2015-06-08 (×6): 1 via ORAL
  Filled 2015-06-06 (×2): qty 1
  Filled 2015-06-06: qty 2
  Filled 2015-06-06 (×2): qty 1
  Filled 2015-06-06 (×2): qty 2
  Filled 2015-06-06: qty 1
  Filled 2015-06-06: qty 2
  Filled 2015-06-06: qty 1
  Filled 2015-06-06 (×2): qty 2

## 2015-06-06 MED ORDER — PHENYLEPHRINE 40 MCG/ML (10ML) SYRINGE FOR IV PUSH (FOR BLOOD PRESSURE SUPPORT)
PREFILLED_SYRINGE | INTRAVENOUS | Status: AC
  Filled 2015-06-06: qty 10

## 2015-06-06 MED ORDER — HYDROMORPHONE HCL 1 MG/ML IJ SOLN: 0.2500 mg | INTRAMUSCULAR | Status: DC | PRN

## 2015-06-06 MED ORDER — BUPIVACAINE-EPINEPHRINE (PF) 0.25% -1:200000 IJ SOLN
INTRAMUSCULAR | Status: AC
  Filled 2015-06-06: qty 30

## 2015-06-06 MED ORDER — BISOPROLOL-HYDROCHLOROTHIAZIDE 5-6.25 MG PO TABS
1.0000 | ORAL_TABLET | Freq: Every morning | ORAL | Status: DC
Start: 2015-06-07 — End: 2015-06-08
  Filled 2015-06-06 (×2): qty 1

## 2015-06-06 MED ORDER — TRANEXAMIC ACID 1000 MG/10ML IV SOLN
1000.0000 mg | Freq: Once | INTRAVENOUS | Status: AC
  Administered 2015-06-06: 1000 mg via INTRAVENOUS
  Filled 2015-06-06: qty 10

## 2015-06-06 MED ORDER — MAGNESIUM CITRATE PO SOLN
1.0000 | Freq: Once | ORAL | Status: AC | PRN
  Filled 2015-06-06: qty 296

## 2015-06-06 MED ORDER — ONDANSETRON HCL 4 MG PO TABS: 4.0000 mg | ORAL_TABLET | Freq: Four times a day (QID) | ORAL | Status: DC | PRN

## 2015-06-06 MED ORDER — METHOCARBAMOL 1000 MG/10ML IJ SOLN
500.0000 mg | Freq: Four times a day (QID) | INTRAVENOUS | Status: DC | PRN
  Administered 2015-06-06: 500 mg via INTRAVENOUS
  Filled 2015-06-06 (×2): qty 5

## 2015-06-06 MED ORDER — DOCUSATE SODIUM 100 MG PO CAPS
100.0000 mg | ORAL_CAPSULE | Freq: Two times a day (BID) | ORAL | Status: DC
  Administered 2015-06-06 – 2015-06-08 (×4): 100 mg via ORAL

## 2015-06-06 MED ORDER — ONDANSETRON HCL 4 MG/2ML IJ SOLN
INTRAMUSCULAR | Status: AC
  Filled 2015-06-06: qty 2

## 2015-06-06 MED ORDER — METOCLOPRAMIDE HCL 5 MG PO TABS
5.0000 mg | ORAL_TABLET | Freq: Three times a day (TID) | ORAL | Status: DC | PRN
  Filled 2015-06-06: qty 2

## 2015-06-06 MED ORDER — DEXAMETHASONE SODIUM PHOSPHATE 10 MG/ML IJ SOLN
10.0000 mg | Freq: Once | INTRAMUSCULAR | Status: AC
  Administered 2015-06-07: 10 mg via INTRAVENOUS
  Filled 2015-06-06: qty 1

## 2015-06-06 MED ORDER — SODIUM CHLORIDE 0.9 % IJ SOLN
INTRAMUSCULAR | Status: DC | PRN
  Administered 2015-06-06: 30 mL

## 2015-06-06 SURGICAL SUPPLY — 53 items
BAG DECANTER FOR FLEXI CONT (MISCELLANEOUS) IMPLANT
BAG ZIPLOCK 12X15 (MISCELLANEOUS) IMPLANT
BANDAGE ELASTIC 6 VELCRO ST LF (GAUZE/BANDAGES/DRESSINGS) ×3 IMPLANT
BANDAGE ESMARK 6X9 LF (GAUZE/BANDAGES/DRESSINGS) ×1 IMPLANT
BLADE SAW SGTL 13.0X1.19X90.0M (BLADE) ×3 IMPLANT
BNDG ESMARK 6X9 LF (GAUZE/BANDAGES/DRESSINGS) ×3
BOWL SMART MIX CTS (DISPOSABLE) ×3 IMPLANT
CAPT KNEE TOTAL 3 ATTUNE ×3 IMPLANT
CEMENT HV SMART SET (Cement) ×6 IMPLANT
CUFF TOURN SGL QUICK 34 (TOURNIQUET CUFF) ×2
CUFF TRNQT CYL 34X4X40X1 (TOURNIQUET CUFF) ×1 IMPLANT
DECANTER SPIKE VIAL GLASS SM (MISCELLANEOUS) ×3 IMPLANT
DRAPE EXTREMITY T 121X128X90 (DRAPE) ×3 IMPLANT
DRAPE POUCH INSTRU U-SHP 10X18 (DRAPES) ×3 IMPLANT
DRAPE U-SHAPE 47X51 STRL (DRAPES) ×3 IMPLANT
DRSG AQUACEL AG ADV 3.5X10 (GAUZE/BANDAGES/DRESSINGS) ×3 IMPLANT
DURAPREP 26ML APPLICATOR (WOUND CARE) ×6 IMPLANT
ELECT REM PT RETURN 9FT ADLT (ELECTROSURGICAL) ×3
ELECTRODE REM PT RTRN 9FT ADLT (ELECTROSURGICAL) ×1 IMPLANT
FACESHIELD WRAPAROUND (MASK) ×15 IMPLANT
GLOVE BIOGEL PI IND STRL 7.5 (GLOVE) ×1 IMPLANT
GLOVE BIOGEL PI IND STRL 8.5 (GLOVE) ×1 IMPLANT
GLOVE BIOGEL PI INDICATOR 7.5 (GLOVE) ×2
GLOVE BIOGEL PI INDICATOR 8.5 (GLOVE) ×2
GLOVE ECLIPSE 8.0 STRL XLNG CF (GLOVE) ×3 IMPLANT
GLOVE ORTHO TXT STRL SZ7.5 (GLOVE) ×6 IMPLANT
GOWN SPEC L3 XXLG W/TWL (GOWN DISPOSABLE) ×3 IMPLANT
GOWN STRL REUS W/TWL LRG LVL3 (GOWN DISPOSABLE) ×3 IMPLANT
HANDPIECE INTERPULSE COAX TIP (DISPOSABLE) ×2
KIT BASIN OR (CUSTOM PROCEDURE TRAY) ×3 IMPLANT
LIQUID BAND (GAUZE/BANDAGES/DRESSINGS) ×3 IMPLANT
MANIFOLD NEPTUNE II (INSTRUMENTS) ×3 IMPLANT
NDL SAFETY ECLIPSE 18X1.5 (NEEDLE) ×1 IMPLANT
NEEDLE HYPO 18GX1.5 SHARP (NEEDLE) ×2
PACK TOTAL JOINT (CUSTOM PROCEDURE TRAY) ×3 IMPLANT
PEN SKIN MARKING BROAD (MISCELLANEOUS) ×3 IMPLANT
POSITIONER SURGICAL ARM (MISCELLANEOUS) ×3 IMPLANT
SET HNDPC FAN SPRY TIP SCT (DISPOSABLE) ×1 IMPLANT
SET PAD KNEE POSITIONER (MISCELLANEOUS) ×3 IMPLANT
SUCTION FRAZIER 12FR DISP (SUCTIONS) ×3 IMPLANT
SUT MNCRL AB 4-0 PS2 18 (SUTURE) ×3 IMPLANT
SUT VIC AB 1 CT1 36 (SUTURE) ×3 IMPLANT
SUT VIC AB 2-0 CT1 27 (SUTURE) ×6
SUT VIC AB 2-0 CT1 TAPERPNT 27 (SUTURE) ×3 IMPLANT
SUT VLOC 180 0 24IN GS25 (SUTURE) ×3 IMPLANT
SYR 50ML LL SCALE MARK (SYRINGE) ×3 IMPLANT
TOWEL OR 17X26 10 PK STRL BLUE (TOWEL DISPOSABLE) ×3 IMPLANT
TOWEL OR NON WOVEN STRL DISP B (DISPOSABLE) IMPLANT
TRAY FOLEY CATH 16FRSI W/METER (SET/KITS/TRAYS/PACK) ×3 IMPLANT
TRAY FOLEY W/METER SILVER 14FR (SET/KITS/TRAYS/PACK) IMPLANT
WATER STERILE IRR 1500ML POUR (IV SOLUTION) ×3 IMPLANT
WRAP KNEE MAXI GEL POST OP (GAUZE/BANDAGES/DRESSINGS) ×3 IMPLANT
YANKAUER SUCT BULB TIP 10FT TU (MISCELLANEOUS) ×3 IMPLANT

## 2015-06-06 NOTE — Anesthesia Postprocedure Evaluation (Signed)
  Anesthesia Post-op Note  Patient: Aaron Burns  Procedure(s) Performed: Procedure(s) (LRB): RIGHT TOTAL KNEE ARTHROPLASTY (Right)  Patient Location: PACU  Anesthesia Type: Spinal  Level of Consciousness: awake and alert   Airway and Oxygen Therapy: Patient Spontanous Breathing  Post-op Pain: mild  Post-op Assessment: Post-op Vital signs reviewed, Patient's Cardiovascular Status Stable, Respiratory Function Stable, Patent Airway and No signs of Nausea or vomiting  Last Vitals:  Filed Vitals:   06/06/15 1857  BP: 101/58  Pulse: 53  Temp: 36.6 C  Resp: 16    Post-op Vital Signs: stable   Complications: No apparent anesthesia complications

## 2015-06-06 NOTE — Op Note (Signed)
NAME:  Aaron Burns                      MEDICAL RECORD NO.:  [id number]                             FACILITY:  [facility]      PHYSICIAN:  Pietro Cassis. Alvan Dame, M.D.  DATE OF BIRTH:  1956-03-23      DATE OF PROCEDURE:  06/06/2015                                     OPERATIVE REPORT         PREOPERATIVE DIAGNOSIS:  Right knee osteoarthritis.      POSTOPERATIVE DIAGNOSIS:  Right knee osteoarthritis.      FINDINGS:  The patient was noted to have complete loss of cartilage and   bone-on-bone arthritis with associated osteophytes in all three compartments of   the knee with a significant synovitis and associated effusion.      PROCEDURE:  Right total knee replacement.      COMPONENTS USED:  DePuy Attune rotating platform posterior stabilized knee   system, a size 7 femur, 8 tibia, 8 mm PS AOX insert, and 41 anatomic patellar   button.      SURGEON:  Pietro Cassis. Alvan Dame, M.D.      ASSISTANT:  Molli Barrows, PA-C.      ANESTHESIA:  Spinal.      SPECIMENS:  None.      COMPLICATION:  None.      DRAINS:  None.  EBL: <50cc      TOURNIQUET TIME:   Total Tourniquet Time Documented: Thigh (Right) - 34 minutes Total: Thigh (Right) - 34 minutes  .      The patient was stable to the recovery room.      INDICATION FOR PROCEDURE:  Aaron Burns is a 59 y.o. male patient of   mine.  The patient had been seen, evaluated, and treated conservatively in the   office with medication, activity modification, and injections.  The patient had   radiographic changes of bone-on-bone arthritis with endplate sclerosis and osteophytes noted.      The patient failed conservative measures including medication, injections, and activity modification, and at this point was ready for more definitive measures.   Based on the radiographic changes and failed conservative measures, the patient   decided to proceed with total knee replacement.  Risks of infection,   DVT, component failure, need for revision surgery, postop  course, and   expectations were all   discussed and reviewed.  Consent was obtained for benefit of pain   relief.      PROCEDURE IN DETAIL:  The patient was brought to the operative theater.   Once adequate anesthesia, preoperative antibiotics, 2 gm of Ancef, 1gm of Tranexamic Acid, and 10mg  of Decadron administered, the patient was positioned supine with the right thigh tourniquet placed.  The  right lower extremity was prepped and draped in sterile fashion.  A time-   out was performed identifying the patient, planned procedure, and   extremity.      The right lower extremity was placed in the [facility] leg holder.  The leg was   exsanguinated, tourniquet elevated to 250 mmHg.  A midline incision was   made followed by median parapatellar arthrotomy.  Following  initial   exposure, attention was first directed to the patella.  Precut   measurement was noted to be 27 mm.  I resected down to 15 mm and used a   41 patellar button to restore patellar height as well as cover the cut   surface.      The lug holes were drilled and a metal shim was placed to protect the   patella from retractors and saw blades.      At this point, attention was now directed to the femur.  The femoral   canal was opened with a drill, irrigated to try to prevent fat emboli.  An   intramedullary rod was passed at 5 degrees valgus, 9 mm of bone was   resected off the distal femur.  Following this resection, the tibia was   subluxated anteriorly.  Using the extramedullary guide, 2-3 mm of bone was resected off   the proximal medial tibia.  We confirmed the gap would be   stable medially and laterally with a 6 mm insert as well as confirmed   the cut was perpendicular in the coronal plane, checking with an alignment rod.      Once this was done, I sized the femur to be a size 7 in the anterior-   posterior dimension, chose a standard component based on medial and   lateral dimension.  The size 7 rotation block was  then pinned in   position anterior referenced using the C-clamp to set rotation.  The   anterior, posterior, and  chamfer cuts were made without difficulty nor   notching making certain that I was along the anterior cortex to help   with flexion gap stability.      The final box cut was made off the lateral aspect of distal femur.      At this point, the tibia was sized to be a size 8, the size 8 tray was   then pinned in position through the medial third of the tubercle,   drilled, and keel punched.  Trial reduction was now carried with a 7 femur,  8 tibia, a size 7 mm insert, and the 41 patella botton.  The knee was brought to   extension, full extension with good flexion stability with the patella   tracking through the trochlea without application of pressure.  Given   all these findings, the trial components removed.  Final components were   opened and cement was mixed.  The knee was irrigated with normal saline   solution and pulse lavage.  The synovial lining was   then injected with 30cc of 0.25% Marcaine with epinephrine and 1 cc of Toradol plus 30cc of NS for  total of 61 cc.      The knee was irrigated.  Final implants were then cemented onto clean and   dried cut surfaces of bone with the knee brought to extension with a size 8 mm trial insert.      Once the cement had fully cured, the excess cement was removed   throughout the knee.  I confirmed I was satisfied with the range of   motion and stability, and the final 8 mm PS AOX insert was chosen.  It was   placed into the knee.      The tourniquet had been let down at 34 minutes.  No significant   hemostasis required.  The   extensor mechanism was then reapproximated using #1 Vicryl and #0 V-lock  sutures with the knee   in flexion.  The   remaining wound was closed with 2-0 Vicryl and running 4-0 Monocryl.   The knee was cleaned, dried, dressed sterilely using Dermabond and   Aquacel dressing.  The patient was then    brought to recovery room in stable condition, tolerating the procedure   well.   Please note that Physician Assistant, Molli Barrows, PA-C, was present for the entirety of the case, and was utilized for pre-operative positioning, peri-operative retractor management, general facilitation of the procedure.  He was also utilized for primary wound closure at the end of the case.              Pietro Cassis Alvan Dame, M.D.    06/06/2015 1:58 PM

## 2015-06-06 NOTE — Anesthesia Procedure Notes (Signed)
Spinal  Start time: 06/06/2015 12:30 PM End time: 06/06/2015 12:32 PM Staffing Resident/CRNA: Harle Stanford R Performed by: resident/CRNA  Preanesthetic Checklist Completed: patient identified, site marked, surgical consent, pre-op evaluation, timeout performed, IV checked, risks and benefits discussed and monitors and equipment checked Spinal Block Patient position: sitting Prep: Betadine Patient monitoring: heart rate, cardiac monitor, continuous pulse ox and blood pressure Approach: midline Location: L3-4 Injection technique: single-shot Needle Needle type: Sprotte  Needle gauge: 24 G Needle length: 9 cm Needle insertion depth: 7 cm Assessment Sensory level: T6 Additional Notes SAB without difficulty.

## 2015-06-06 NOTE — Anesthesia Preprocedure Evaluation (Signed)
Anesthesia Evaluation  Patient identified by MRN, date of birth, ID band Patient awake    Reviewed: Allergy & Precautions, NPO status , Patient's Chart, lab work & pertinent test results  Airway Mallampati: II  TM Distance: >3 FB Neck ROM: Full    Dental no notable dental hx.    Pulmonary neg pulmonary ROS, former smoker,  breath sounds clear to auscultation  Pulmonary exam normal       Cardiovascular hypertension, Pt. on medications Normal cardiovascular examRhythm:Regular Rate:Normal     Neuro/Psych negative neurological ROS  negative psych ROS   GI/Hepatic negative GI ROS, Neg liver ROS,   Endo/Other  negative endocrine ROS  Renal/GU negative Renal ROS  negative genitourinary   Musculoskeletal negative musculoskeletal ROS (+)   Abdominal   Peds negative pediatric ROS (+)  Hematology negative hematology ROS (+)   Anesthesia Other Findings   Reproductive/Obstetrics negative OB ROS                             Anesthesia Physical Anesthesia Plan  ASA: II  Anesthesia Plan: Spinal   Post-op Pain Management:    Induction: Intravenous  Airway Management Planned: Simple Face Mask  Additional Equipment:   Intra-op Plan:   Post-operative Plan:   Informed Consent: I have reviewed the patients History and Physical, chart, labs and discussed the procedure including the risks, benefits and alternatives for the proposed anesthesia with the patient or authorized representative who has indicated his/her understanding and acceptance.   Dental advisory given  Plan Discussed with: CRNA and Surgeon  Anesthesia Plan Comments:         Anesthesia Quick Evaluation

## 2015-06-06 NOTE — Interval H&P Note (Signed)
History and Physical Interval Note:  06/06/2015 11:28 AM  Aaron Burns  has presented today for surgery, with the diagnosis of RIGHT KNEE OAQ  The various methods of treatment have been discussed with the patient and family. After consideration of risks, benefits and other options for treatment, the patient has consented to  Procedure(s): RIGHT TOTAL KNEE ARTHROPLASTY (Right) as a surgical intervention .  The patient's history has been reviewed, patient examined, no change in status, stable for surgery.  I have reviewed the patient's chart and labs.  Questions were answered to the patient's satisfaction.     Mauri Pole

## 2015-06-06 NOTE — Plan of Care (Signed)
Problem: Consults Goal: Diagnosis- Total Joint Replacement Primary Total Knee     

## 2015-06-06 NOTE — Transfer of Care (Signed)
Immediate Anesthesia Transfer of Care Note  Patient: Aaron Burns  Procedure(s) Performed: Procedure(s): RIGHT TOTAL KNEE ARTHROPLASTY (Right)  Patient Location: PACU  Anesthesia Type:Spinal  Level of Consciousness: awake, alert  and oriented  Airway & Oxygen Therapy: Patient Spontanous Breathing and Patient connected to face mask oxygen  Post-op Assessment: Report given to RN and Post -op Vital signs reviewed and stable  Post vital signs: Reviewed and stable  Last Vitals:  Filed Vitals:   06/06/15 0934  BP: 165/62  Pulse: 58  Temp: 36.6 C  Resp: 16    Complications: No apparent anesthesia complications

## 2015-06-07 ENCOUNTER — Encounter (HOSPITAL_COMMUNITY): Payer: Self-pay | Admitting: Orthopedic Surgery

## 2015-06-07 HISTORY — DX: Morbid (severe) obesity due to excess calories: E66.01

## 2015-06-07 LAB — BASIC METABOLIC PANEL
Anion gap: 6 (ref 5–15)
BUN: 19 mg/dL (ref 6–20)
CALCIUM: 8.9 mg/dL (ref 8.9–10.3)
CO2: 27 mmol/L (ref 22–32)
CREATININE: 0.74 mg/dL (ref 0.61–1.24)
Chloride: 104 mmol/L (ref 101–111)
GFR calc Af Amer: >60 mL/min (ref >60)
GFR calc non Af Amer: >60 mL/min (ref >60)
GLUCOSE: 144 mg/dL — AB (ref 65–99)
Potassium: 4.4 mmol/L (ref 3.5–5.1)
Sodium: 137 mmol/L (ref 135–145)

## 2015-06-07 LAB — CBC
HCT: 41.2 % (ref 39.0–52.0)
Hemoglobin: 13.2 g/dL (ref 13.0–17.0)
MCH: 29.3 pg (ref 26.0–34.0)
MCHC: 32 g/dL (ref 30.0–36.0)
MCV: 91.6 fL (ref 78.0–100.0)
Platelets: 156 10*3/uL (ref 150–400)
RBC: 4.5 MIL/uL (ref 4.22–5.81)
RDW: 13.8 % (ref 11.5–15.5)
WBC: 19 10*3/uL — ABNORMAL HIGH (ref 4.0–10.5)

## 2015-06-07 NOTE — Progress Notes (Signed)
     Subjective: 1 Day Post-Op Procedure(s) (LRB): RIGHT TOTAL KNEE ARTHROPLASTY (Right)   Patient reports pain as mild, pain controlled. No events throughout the night. Ready to work with PT.  Objective:   VITALS:   Filed Vitals:   06/07/15 0857  BP: 111/54  Pulse: 62  Temp: 97.7 F (36.5 C)  Resp: 16    Dorsiflexion/Plantar flexion intact Incision: dressing C/D/I No cellulitis present Compartment soft  LABS  Recent Labs  06/07/15 0430  HGB 13.2  HCT 41.2  WBC 19.0*  PLT 156     Recent Labs  06/07/15 0430  NA 137  K 4.4  BUN 19  CREATININE 0.74  GLUCOSE 144*     Assessment/Plan: 1 Day Post-Op Procedure(s) (LRB): RIGHT TOTAL KNEE ARTHROPLASTY (Right) Foley cath d/c'ed Advance diet Up with therapy D/C IV fluids Discharge home with home health eventually, when ready  Obese (BMI 30-39.9) Estimated body mass index is 34.3 kg/(m^2) as calculated from the following:   Height as of this encounter: 5\' 9"  (1.753 m).   Weight as of this encounter: 105.416 kg (232 lb 6.4 oz). Patient also counseled that weight may inhibit the healing process Patient counseled that losing weight will help with future health issues      [place] Pugh. Kerra Guilfoil   PAC  06/07/2015, 9:04 AM

## 2015-06-07 NOTE — Evaluation (Signed)
Physical Therapy Evaluation Patient Details Name: Aaron Burns MRN: [id number] DOB: 15-Nov-1956 Today's Date: 06/07/2015   History of Present Illness  R TKR  Clinical Impression  Pt s/p R TKR presents with decreased R LE strength/ROM and post op pain limiting functional mobility.  Pt should progress well to dc home with family assist and HHPT follow up.    Follow Up Recommendations Home health PT    Equipment Recommendations  None recommended by PT    Recommendations for Other Services OT consult     Precautions / Restrictions Precautions Precautions: Knee;Fall Restrictions Weight Bearing Restrictions: No Other Position/Activity Restrictions: WBAT      Mobility  Bed Mobility Overal bed mobility: Needs Assistance Bed Mobility: Supine to Sit     Supine to sit: Min assist     General bed mobility comments: cues for sequence and use of L LE to self assist  Transfers Overall transfer level: Needs assistance Equipment used: Rolling walker (2 wheeled) Transfers: Sit to/from Stand Sit to Stand: Min assist         General transfer comment: cues for LE management and use of UES to self assist  Ambulation/Gait Ambulation/Gait assistance: Min assist Ambulation Distance (Feet): 21 Feet Assistive device: Rolling walker (2 wheeled) Gait Pattern/deviations: Step-to pattern;Decreased step length - right;Decreased step length - left;Shuffle;Trunk flexed Gait velocity: decr Gait velocity interpretation: Below normal speed for age/gender General Gait Details: cues for sequence, posture and position from [employer] ltd by c/o dizziness   Stairs            Wheelchair Mobility    Modified Rankin (Stroke Patients Only)       Balance                                             Pertinent Vitals/Pain Pain Assessment: 0-10 Pain Score: 4  Pain Location: R knee Pain Descriptors / Indicators: Aching;Sore Pain Intervention(s): Limited activity within  patient's tolerance;Monitored during session;Premedicated before session;Ice applied    Home Living Family/patient expects to be discharged to:: Private residence Living Arrangements: Spouse/significant other Available Help at Discharge: Family Type of Home: House Home Access: Stairs to enter Entrance Stairs-Rails: None Technical brewer of Steps: 1+1 Home Layout: One level Home Equipment: Environmental consultant - 2 wheels      Prior Function Level of Independence: Independent               Hand Dominance        Extremity/Trunk Assessment   Upper Extremity Assessment: Overall WFL for tasks assessed           Lower Extremity Assessment: RLE deficits/detail RLE Deficits / Details: 3/5 quads with pt performing IND SLR, AAROM at knee -10- 60    Cervical / Trunk Assessment: Normal  Communication   Communication: No difficulties  Cognition Arousal/Alertness: Awake/alert Behavior During Therapy: WFL for tasks assessed/performed Overall Cognitive Status: Within Functional Limits for tasks assessed                      General Comments      Exercises Total Joint Exercises Ankle Circles/Pumps: AROM;Both;15 reps;Supine Quad Sets: AROM;Both;10 reps;Supine Heel Slides: AAROM;Right;15 reps;Supine Straight Leg Raises: AAROM;AROM;Right;15 reps;Supine      Assessment/Plan    PT Assessment Patient needs continued PT services  PT Diagnosis Difficulty walking   PT Problem List Decreased strength;Decreased range  of motion;Decreased activity tolerance;Decreased mobility;Decreased knowledge of use of DME;Pain  PT Treatment Interventions DME instruction;Gait training;Stair training;Therapeutic activities;Functional mobility training;Therapeutic exercise;Patient/family education   PT Goals (Current goals can be found in the Care Plan section) Acute Rehab PT Goals Patient Stated Goal: Resume previous lifestyle with decreased pain PT Goal Formulation: With patient Time For  Goal Achievement: 06/10/15 Potential to Achieve Goals: Good    Frequency 7X/week   Barriers to discharge        Co-evaluation               End of Session Equipment Utilized During Treatment: Gait belt Activity Tolerance: Patient tolerated treatment well Patient left: in chair;with call bell/phone within reach Nurse Communication: Mobility status         Time: 0832-0905 PT Time Calculation (min) (ACUTE ONLY): 33 min   Charges:   PT Evaluation $Initial PT Evaluation Tier I: 1 Procedure PT Treatments $Therapeutic Exercise: 8-22 mins   PT G Codes:        Aaron Burns June 30, 2015, 11:51 AM

## 2015-06-07 NOTE — Care Management Note (Addendum)
Case Management Note  Patient Details  Name: Aaron Burns MRN: [id number] Date of Birth: Sep 11, 1956  Subjective/Objective:                   RIGHT TOTAL KNEE ARTHROPLASTY (Right) Action/Plan:  Discharge planning Expected Discharge Date:  06/08/15               Expected Discharge Plan:  [place]  In-House Referral:     Discharge planning Services  CM Consult  Post Acute Care Choice:  Home Health Choice offered to:  Patient  DME Arranged:    DME Agency:     HH Arranged:  PT HH Agency:  [place]  Status of Service:  Completed, signed off  Medicare Important Message Given:    Date Medicare IM Given:    Medicare IM give by:    Date Additional Medicare IM Given:    Additional Medicare Important Message give by:     If discussed at [place] of Stay Meetings, dates discussed:    Additional Comments: CM met with pt in room to offer choice of home health agency.  Pt chooses [facility] to render HHPT.  Pt has both rolling walker and 3n1 from wife's previous surgery.  Address and contact information verified by pt.  Referral given to [employer], Tim.  NO other CM needs were communicated. Dellie Catholic, RN 06/07/2015, 11:27 AM

## 2015-06-07 NOTE — Progress Notes (Signed)
Physical Therapy Treatment Patient Details Name: Aaron Burns MRN: [id number] DOB: Apr 17, 1956 Today's Date: July 01, 2015    History of Present Illness R TKR    PT Comments    Pt progressing well and hopeful for dc tomorrow  Follow Up Recommendations  Home health PT     Equipment Recommendations  None recommended by PT    Recommendations for Other Services OT consult     Precautions / Restrictions Precautions Precautions: Knee;Fall Restrictions Weight Bearing Restrictions: No Other Position/Activity Restrictions: WBAT    Mobility  Bed Mobility Overal bed mobility: Needs Assistance Bed Mobility: Sit to Supine       Sit to supine: Min guard   General bed mobility comments: cues for sequence and use of L LE to self assist  Transfers Overall transfer level: Needs assistance Equipment used: Rolling walker (2 wheeled) Transfers: Sit to/from Stand Sit to Stand: Min guard         General transfer comment: cues for UE placement  Ambulation/Gait Ambulation/Gait assistance: Min assist;Min guard Ambulation Distance (Feet): 123 Feet Assistive device: Rolling walker (2 wheeled) Gait Pattern/deviations: Step-to pattern;Step-through pattern;Decreased step length - right;Decreased step length - left;Shuffle;Trunk flexed Gait velocity: decr   General Gait Details: cues for sequence, posture and position from [employer]            Wheelchair Mobility    Modified Rankin (Stroke Patients Only)       Balance                                    Cognition Arousal/Alertness: Awake/alert Behavior During Therapy: WFL for tasks assessed/performed Overall Cognitive Status: Within Functional Limits for tasks assessed                      Exercises Total Joint Exercises Ankle Circles/Pumps: AROM;Both;15 reps;Supine Quad Sets: AROM;Both;10 reps;Supine Heel Slides: AAROM;Right;15 reps;Supine Straight Leg Raises: AAROM;AROM;Right;15  reps;Supine Goniometric ROM: AAROM at R knee -10- 75    General Comments        Pertinent Vitals/Pain Pain Assessment: 0-10 Pain Score: 4  Pain Location: R knee Pain Descriptors / Indicators: Aching;Sore Pain Intervention(s): Limited activity within patient's tolerance;Monitored during session;Premedicated before session;Ice applied    Home Living                      Prior Function            PT Goals (current goals can now be found in the care plan section) Acute Rehab PT Goals Patient Stated Goal: Resume previous lifestyle with decreased pain PT Goal Formulation: With patient Time For Goal Achievement: 06/10/15 Potential to Achieve Goals: Good Progress towards PT goals: Progressing toward goals    Frequency  7X/week    PT Plan Current plan remains appropriate    Co-evaluation             End of Session Equipment Utilized During Treatment: Gait belt Activity Tolerance: Patient tolerated treatment well Patient left: in bed;with call bell/phone within reach     Time: 1450-1522 PT Time Calculation (min) (ACUTE ONLY): 32 min  Charges:  $Gait Training: 8-22 mins $Therapeutic Exercise: 8-22 mins                    G Codes:      Aaron Burns 07-01-2015, 4:23 PM

## 2015-06-07 NOTE — Progress Notes (Signed)
Utilization review completed.  

## 2015-06-07 NOTE — Evaluation (Signed)
Occupational Therapy Evaluation Patient Details Name: Aaron Burns MRN: [id number] DOB: [birth year] Today's Date: 06/07/2015    History of Present Illness R TKR   Clinical Impression   This 59 year old man was admitted for the above surgery.  He was independent with adls prior to admission and currently needs min A.  He will be alone during the day, and family will assist with LB adls as needed.  Goal in acute is for supervision level with commode transfers.    Follow Up Recommendations  No OT follow up    Equipment Recommendations  None recommended by OT    Recommendations for Other Services       Precautions / Restrictions Precautions Precautions: Knee;Fall Restrictions Other Position/Activity Restrictions: WBAT      Mobility Bed Mobility               General bed mobility comments: oob  Transfers   Equipment used: Rolling walker (2 wheeled) Transfers: Sit to/from Stand Sit to Stand: Min assist;Min guard         General transfer comment: cues for UE placement    Balance                                            ADL Overall ADL's : Needs assistance/impaired     Grooming: Oral care;Supervision/safety;Standing   Upper Body Bathing: Set up;Sitting   Lower Body Bathing: Minimal assistance;Sit to/from stand   Upper Body Dressing : Set up;Sitting   Lower Body Dressing: Minimal assistance;Sit to/from stand   Toilet Transfer: Ambulation;BSC;Min guard   Toileting- Water quality scientist and Hygiene: Min guard;Sit to/from stand         General ADL Comments: reviewed options for tub with seat.  He has DME from when wife had bil hip sx.  will be alone during the day. Pt c/o dizziness when he turned to sit on commode BP 115/50.  no further dizziness     Vision     Perception     Praxis      Pertinent Vitals/Pain Pain Score: 3  Pain Location: R knee Pain Descriptors / Indicators: Sore Pain Intervention(s): Limited activity  within patient's tolerance;Monitored during session;Premedicated before session;Repositioned;Ice applied     Hand Dominance     Extremity/Trunk Assessment Upper Extremity Assessment Upper Extremity Assessment: Overall WFL for tasks assessed           Communication Communication Communication: No difficulties   Cognition Arousal/Alertness: Awake/alert Behavior During Therapy: WFL for tasks assessed/performed Overall Cognitive Status: Within Functional Limits for tasks assessed                     General Comments       Exercises       Shoulder Instructions      Home Living Family/patient expects to be discharged to:: Private residence Living Arrangements: Spouse/significant other Available Help at Discharge: Family               Bathroom Shower/Tub: Tub/shower unit Shower/tub characteristics: Curtain Biochemist, clinical: Handicapped height     Home Equipment: Bedside commode;Shower seat          Prior Functioning/Environment Level of Independence: Independent             OT Diagnosis: Generalized weakness   OT Problem List: Decreased strength;Decreased activity tolerance;Decreased knowledge of use of DME or AE;Pain  OT Treatment/Interventions: Self-care/ADL training;DME and/or AE instruction;Patient/family education    OT Goals(Current goals can be found in the care plan section) Acute Rehab OT Goals Patient Stated Goal: Resume previous lifestyle with decreased pain OT Goal Formulation: With patient Time For Goal Achievement: 06/14/15 Potential to Achieve Goals: Good ADL Goals Pt Will Transfer to Toilet: with supervision;ambulating;bedside commode  OT Frequency: Min 2X/week   Barriers to D/C:            Co-evaluation              End of Session    Activity Tolerance: Patient tolerated treatment well Patient left: in chair;with call bell/phone within reach;with restraints reapplied   Time: 1232-1252 OT Time Calculation  (min): 20 min Charges:  OT General Charges $OT Visit: 1 Procedure OT Evaluation $Initial OT Evaluation Tier I: 1 Procedure G-Codes:    Vaudine Dutan 06-15-2015, 1:03 PM Lesle Chris, OTR/L [phone] 06-15-2015

## 2015-06-08 LAB — BASIC METABOLIC PANEL
Anion gap: 9 (ref 5–15)
BUN: 18 mg/dL (ref 6–20)
CHLORIDE: 107 mmol/L (ref 101–111)
CO2: 27 mmol/L (ref 22–32)
Calcium: 8.9 mg/dL (ref 8.9–10.3)
Creatinine, Ser: 0.74 mg/dL (ref 0.61–1.24)
GFR calc Af Amer: >60 mL/min (ref >60)
Glucose, Bld: 155 mg/dL — ABNORMAL HIGH (ref 65–99)
Potassium: 4.4 mmol/L (ref 3.5–5.1)
SODIUM: 143 mmol/L (ref 135–145)

## 2015-06-08 LAB — CBC
HCT: 38.7 % — ABNORMAL LOW (ref 39.0–52.0)
Hemoglobin: 12.6 g/dL — ABNORMAL LOW (ref 13.0–17.0)
MCH: 30.1 pg (ref 26.0–34.0)
MCHC: 32.6 g/dL (ref 30.0–36.0)
MCV: 92.6 fL (ref 78.0–100.0)
PLATELETS: 148 10*3/uL — AB (ref 150–400)
RBC: 4.18 MIL/uL — ABNORMAL LOW (ref 4.22–5.81)
RDW: 14.5 % (ref 11.5–15.5)
WBC: 18 10*3/uL — ABNORMAL HIGH (ref 4.0–10.5)

## 2015-06-08 MED ORDER — ASPIRIN 325 MG PO TBEC: 325.0000 mg | DELAYED_RELEASE_TABLET | Freq: Two times a day (BID) | ORAL | Status: AC

## 2015-06-08 MED ORDER — ASPIRIN 325 MG PO TBEC: 325.0000 mg | DELAYED_RELEASE_TABLET | Freq: Two times a day (BID) | ORAL | Status: DC

## 2015-06-08 MED ORDER — FERROUS SULFATE 325 (65 FE) MG PO TABS: 325.0000 mg | ORAL_TABLET | Freq: Three times a day (TID) | ORAL | Status: DC

## 2015-06-08 MED ORDER — METHOCARBAMOL 500 MG PO TABS: 500.0000 mg | ORAL_TABLET | Freq: Four times a day (QID) | ORAL | Status: DC | PRN

## 2015-06-08 MED ORDER — HYDROCODONE-ACETAMINOPHEN 7.5-325 MG PO TABS: 1.0000 | ORAL_TABLET | ORAL | Status: DC | PRN

## 2015-06-08 MED ORDER — POLYETHYLENE GLYCOL 3350 17 G PO PACK: 17.0000 g | PACK | Freq: Two times a day (BID) | ORAL | Status: DC

## 2015-06-08 MED ORDER — BISOPROLOL-HYDROCHLOROTHIAZIDE 5-6.25 MG PO TABS: 1.0000 | ORAL_TABLET | Freq: Every morning | ORAL | Status: DC

## 2015-06-08 MED ORDER — DOCUSATE SODIUM 100 MG PO CAPS: 100.0000 mg | ORAL_CAPSULE | Freq: Two times a day (BID) | ORAL | Status: DC

## 2015-06-08 NOTE — Discharge Instructions (Signed)

## 2015-06-08 NOTE — Progress Notes (Signed)
Occupational Therapy Treatment Patient Details Name: Aaron Burns MRN: [id number] DOB: Oct 24, 1956 Today's Date: 06/08/2015    History of present illness R TKR   OT comments  Goal met; pt discharged from OT in acute  Follow Up Recommendations  No OT follow up    Equipment Recommendations  None recommended by OT    Recommendations for Other Services      Precautions / Restrictions Precautions Precautions: Knee;Fall Restrictions Other Position/Activity Restrictions: WBAT       Mobility Bed Mobility               General bed mobility comments: oob  Transfers   Equipment used: Rolling walker (2 wheeled) Transfers: Sit to/from Stand Sit to Stand: Modified independent (Device/Increase time)         General transfer comment: no cues needed    Balance                                   ADL                           Toilet Transfer: Supervision/safety;Ambulation   Toileting- Clothing Manipulation and Hygiene: Sit to/from stand;Modified independent         General ADL Comments: pt demonstrated good safe technique when ambulating to bathroom.  Cue only for walker when getting to commode due to tight spaces. Pt washed at sink with set up/supervision.      Vision                     Perception     Praxis      Cognition   Behavior During Therapy: WFL for tasks assessed/performed Overall Cognitive Status: Within Functional Limits for tasks assessed                       Extremity/Trunk Assessment               Exercises     Shoulder Instructions       General Comments      Pertinent Vitals/ Pain       Pain Score: 2  Pain Location: R knee Pain Descriptors / Indicators: Aching Pain Intervention(s): Limited activity within patient's tolerance;Monitored during session;Premedicated before session;Repositioned;Ice applied  Home Living                                          Prior  Functioning/Environment              Frequency       Progress Toward Goals  OT Goals(current goals can now be found in the care plan section)  Progress towards OT goals: Goals met/education completed, patient discharged from [place] of Session     Activity Tolerance Patient tolerated treatment well   Patient Left in chair;with call bell/phone within reach;with restraints reapplied   Nurse Communication          Time: 3086-5784 OT Time Calculation (min): 16 min  Charges: OT General Charges $OT Visit: 1 Procedure OT Treatments $Self Care/Home Management : 8-22 mins  Bryann Gentz 06/08/2015, 8:03 AM  [place], [place] [phone] 06/08/2015

## 2015-06-08 NOTE — Progress Notes (Signed)
Physical Therapy Treatment Patient Details Name: Aaron Burns MRN: [id number] DOB: 04/12/1956 Today's Date: 06/08/2015    History of Present Illness R TKR    PT Comments    Pt eager for dc this pm  Follow Up Recommendations  Home health PT     Equipment Recommendations  None recommended by PT    Recommendations for Other Services OT consult     Precautions / Restrictions Precautions Precautions: Knee;Fall Restrictions Weight Bearing Restrictions: No Other Position/Activity Restrictions: WBAT    Mobility  Bed Mobility               General bed mobility comments: pt declines back to bed  Transfers Overall transfer level: Needs assistance Equipment used: Rolling walker (2 wheeled) Transfers: Sit to/from Stand Sit to Stand: Modified independent (Device/Increase time)         General transfer comment: no cues needed  Ambulation/Gait Ambulation/Gait assistance: Supervision Ambulation Distance (Feet): 200 Feet Assistive device: Rolling walker (2 wheeled) Gait Pattern/deviations: Step-to pattern;Step-through pattern;Decreased step length - right;Decreased step length - left;Shuffle;Trunk flexed Gait velocity: decr   General Gait Details: min cues for sequence, posture and position from RW    Stairs Stairs: Yes Stairs assistance: Min assist Stair Management: No rails;Step to pattern;Forwards;Backwards;With walker Number of Stairs: 2 General stair comments: single step twice with cues for sequence and foot/RW placement  Wheelchair Mobility    Modified Rankin (Stroke Patients Only)       Balance                                    Cognition Arousal/Alertness: Awake/alert Behavior During Therapy: WFL for tasks assessed/performed Overall Cognitive Status: Within Functional Limits for tasks assessed                      Exercises Total Joint Exercises Ankle Circles/Pumps: AROM;Both;15 reps;Supine Quad Sets: AROM;Supine;15  reps Heel Slides: AAROM;Right;Supine;20 reps (Pt self assisted R LE with sheet wrapped at ankle) Straight Leg Raises: AAROM;AROM;Right;Supine;20 reps Long Arc Quad: AAROM;AROM;Both;Supine;15 reps Knee Flexion: AAROM;Right;15 reps;Supine (Pt self assisted R LE with L LE)    General Comments        Pertinent Vitals/Pain Pain Assessment: 0-10 Pain Score: 4  Pain Location: R knee Pain Descriptors / Indicators: Aching;Sore Pain Intervention(s): Limited activity within patient's tolerance;Premedicated before session;Monitored during session;Ice applied    Home Living                      Prior Function            PT Goals (current goals can now be found in the care plan section) Acute Rehab PT Goals Patient Stated Goal: Resume previous lifestyle with decreased pain PT Goal Formulation: With patient Time For Goal Achievement: 06/10/15 Potential to Achieve Goals: Good Progress towards PT goals: Progressing toward goals    Frequency  7X/week    PT Plan Current plan remains appropriate    Co-evaluation             End of Session Equipment Utilized During Treatment: Gait belt Activity Tolerance: Patient tolerated treatment well Patient left: in chair;with call bell/phone within reach;with family/visitor present     Time: 9485-4627 PT Time Calculation (min) (ACUTE ONLY): 57 min  Charges:  $Gait Training: 8-22 mins $Therapeutic Exercise: 23-37 mins  G Codes:      Aaron Burns June 25, 2015, 2:31 PM

## 2015-06-08 NOTE — Progress Notes (Signed)
     Subjective: 2 Days Post-Op Procedure(s) (LRB): RIGHT TOTAL KNEE ARTHROPLASTY (Right)   Patient reports pain as mild, pain controlled. No events throughout the night. Ready to be discharged home.  Objective:   VITALS:   Filed Vitals:   06/08/15 0641  BP: 131/58  Pulse: 50  Temp: 97.5 F (36.4 C)  Resp: 16    Dorsiflexion/Plantar flexion intact Incision: dressing C/D/I No cellulitis present Compartment soft  LABS  Recent Labs  06/07/15 0430 06/08/15 0519  HGB 13.2 12.6*  HCT 41.2 38.7*  WBC 19.0* 18.0*  PLT 156 148*     Recent Labs  06/07/15 0430 06/08/15 0519  NA 137 143  K 4.4 4.4  BUN 19 18  CREATININE 0.74 0.74  GLUCOSE 144* 155*     Assessment/Plan: 2 Days Post-Op Procedure(s) (LRB): RIGHT TOTAL KNEE ARTHROPLASTY (Right) Up with therapy Discharge home with home health Follow up in 2 weeks at [facility]. Follow up with OLIN,Anais Denslow D in 2 weeks.  Contact information:  [facility] [address] [place] [phone]    Obese (BMI 30-39.9) Estimated body mass index is 34.3 kg/(m^2) as calculated from the following:  Height as of this encounter: 5\' 9"  (1.753 m).  Weight as of this encounter: 105.416 kg (232 lb 6.4 oz). Patient also counseled that weight may inhibit the healing process Patient counseled that losing weight will help with future health issues    West Pugh. Chalice Philbert   PAC  06/08/2015, 9:58 AM

## 2015-06-08 NOTE — Progress Notes (Signed)
Physical Therapy Treatment Patient Details Name: Aaron Burns MRN: [id number] DOB: [birth year] Today's Date: 07/07/15    History of Present Illness R TKR    PT Comments    Pt progressing well with mobility.  Follow Up Recommendations  Home health PT     Equipment Recommendations  None recommended by PT    Recommendations for Other Services OT consult     Precautions / Restrictions Precautions Precautions: Knee;Fall Restrictions Weight Bearing Restrictions: No Other Position/Activity Restrictions: WBAT    Mobility  Bed Mobility               General bed mobility comments: oob - states did not need assist  Transfers Overall transfer level: Needs assistance Equipment used: Rolling walker (2 wheeled) Transfers: Sit to/from Stand Sit to Stand: Modified independent (Device/Increase time)         General transfer comment: no cues needed  Ambulation/Gait Ambulation/Gait assistance: Min guard Ambulation Distance (Feet): 150 Feet Assistive device: Rolling walker (2 wheeled) Gait Pattern/deviations: Step-to pattern;Step-through pattern;Shuffle;Decreased step length - right;Decreased step length - left;Trunk flexed Gait velocity: decr   General Gait Details: min cues for sequence, posture and position from RW    Stairs Stairs: Yes Stairs assistance: Min assist Stair Management: No rails;Step to pattern;Forwards;With walker Number of Stairs: 1 General stair comments: cues for sequence  and foot/RW placement  Wheelchair Mobility    Modified Rankin (Stroke Patients Only)       Balance                                    Cognition Arousal/Alertness: Awake/alert Behavior During Therapy: WFL for tasks assessed/performed Overall Cognitive Status: Within Functional Limits for tasks assessed                      Exercises Total Joint Exercises Ankle Circles/Pumps: AROM;Both;15 reps;Supine Quad Sets: AROM;Both;Supine;20 reps Heel  Slides: AAROM;Right;Supine;20 reps Straight Leg Raises: AAROM;AROM;Right;Supine;20 reps Long Arc Quad: AAROM;AROM;Both;Supine;15 reps Goniometric ROM: AAROM at R knee -10 - 75    General Comments        Pertinent Vitals/Pain Pain Assessment: 0-10 Pain Score: 4  Pain Location: R knee Pain Descriptors / Indicators: Aching;Sore Pain Intervention(s): Limited activity within patient's tolerance;Monitored during session;Premedicated before session;Ice applied    Home Living                      Prior Function            PT Goals (current goals can now be found in the care plan section) Acute Rehab PT Goals Patient Stated Goal: Resume previous lifestyle with decreased pain PT Goal Formulation: With patient Time For Goal Achievement: 06/10/15 Potential to Achieve Goals: Good Progress towards PT goals: Progressing toward goals    Frequency  7X/week    PT Plan Current plan remains appropriate    Co-evaluation             End of Session Equipment Utilized During Treatment: Gait belt Activity Tolerance: Patient tolerated treatment well Patient left: in chair;with call bell/phone within reach     Time: 0842-0920 PT Time Calculation (min) (ACUTE ONLY): 38 min  Charges:  $Gait Training: 23-37 mins $Therapeutic Exercise: 8-22 mins                    G Codes:      Serine Kea 07-07-2015, 2:19 PM

## 2015-06-08 NOTE — Progress Notes (Signed)
Pt to d/c home with [facility] home [facility]. No DME needs. AVS reviewed and "My Chart" discussed with pt. Pt capable of verbalizing medications, signs and symptoms of infection, and follow-up appointments. Remains hemodynamically stable. No signs and symptoms of distress. Educated pt to return to ER in the case of SOB, dizziness, or chest pain.  

## 2015-06-13 NOTE — Discharge Summary (Signed)
Physician Discharge Summary  Patient ID: Aaron Burns MRN: [id number] DOB/AGE: [birth year] 59 y.o.  Admit date: 06/06/2015 Discharge date: 06/08/2015   Procedures:  Procedure(s) (LRB): RIGHT TOTAL KNEE ARTHROPLASTY (Right)  Attending Physician:  Dr. Paralee Cancel   Admission Diagnoses:   Right knee primary OA / pain  Discharge Diagnoses:  Principal Problem:   S/P right TKA Active Problems:   Obese  Past Medical History  Diagnosis Date  . Hypertension   . Heart murmur     at birth  . Elevated hemoglobin A1c   . 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 History of kidney stones 2011    HPI:    Aaron Burns, 59 y.o. male, has a history of pain and functional disability in the right knee due to arthritis and has failed non-surgical conservative treatments for greater than 12 weeks to include NSAID's and/or analgesics, corticosteriod injections, viscosupplementation injections, use of assistive devices and activity modification. Onset of symptoms was gradual, starting 3+ years ago with gradually worsening course since that time. The patient noted prior procedures on the knee to include arthroscopy and menisectomy on the right knee(s). Patient currently rates pain in the right knee(s) at 10 out of 10 with activity. Patient has night pain, worsening of pain with activity and weight bearing, pain that interferes with activities of daily living, pain with passive range of motion, crepitus and joint swelling. Patient has evidence of periarticular osteophytes and joint space narrowing by imaging studies. There is no active infection. Risks, benefits and expectations were discussed with the patient. Risks including but not limited to the risk of anesthesia, blood clots, nerve damage, blood vessel damage, failure of the prosthesis, infection and up to and including death. Patient understand the risks, benefits and expectations and wishes to  proceed with surgery.   PCP: Alesia Richards, MD   Discharged Condition: good  Hospital Course:  Patient underwent the above stated procedure on 06/06/2015. Patient tolerated the procedure well and brought to the recovery room in good condition and subsequently to the floor.  POD #1 BP: 111/54 ; Pulse: 62 ; Temp: 97.7 F (36.5 C) ; Resp: 16 Patient reports pain as mild, pain controlled. No events throughout the night. Ready to work with PT. Dorsiflexion/plantar flexion intact, incision: dressing C/D/I, no cellulitis present and compartment soft.   LABS  Basename    HGB  13.2  HCT  41.2   POD #2  BP: 131/58 ; Pulse: 50 ; Temp: 97.5 F (36.4 C) ; Resp: 16 Patient reports pain as mild, pain controlled. No events throughout the night. Ready to be discharged home. Dorsiflexion/plantar flexion intact, incision: dressing C/D/I, no cellulitis present and compartment soft.   LABS  Basename    HGB  12.6  HCT  38.7    Discharge Exam: General appearance: alert, cooperative and no distress Extremities: Homans sign is negative, no sign of DVT, no edema, redness or tenderness in the calves or thighs and no ulcers, gangrene or trophic changes  Disposition: Home with follow up in 2 weeks   Follow-up Information    Follow up with Mauri Pole, MD. Schedule an appointment as soon as possible for a visit in 2 weeks.   Specialty:  Orthopedic Surgery   Contact information:   [address] [place] [address] [phone]       Follow up with [facility].   Contact information:   [address]  [place] [address] [phone]       Discharge Instructions    Call MD / Call 911    Complete by:  As directed   If you experience chest pain or shortness of breath, CALL 911 and be transported to the hospital emergency room.  If you develope a fever above 101 F, pus (white drainage) or increased drainage or redness at the wound, or calf pain,  call your surgeon's office.     Change dressing    Complete by:  As directed   Maintain surgical dressing until follow up in the clinic. If the edges start to pull up, may reinforce with tape. If the dressing is no longer working, may remove and cover with gauze and tape, but must keep the area dry and clean.  Call with any questions or concerns.     Constipation Prevention    Complete by:  As directed   Drink plenty of fluids.  Prune juice may be helpful.  You may use a stool softener, such as Colace (over the counter) 100 mg twice a day.  Use MiraLax (over the counter) for constipation as needed.     Diet - low sodium heart healthy    Complete by:  As directed      Discharge instructions    Complete by:  As directed   Maintain surgical dressing until follow up in the clinic. If the edges start to pull up, may reinforce with tape. If the dressing is no longer working, may remove and cover with gauze and tape, but must keep the area dry and clean.  Follow up in 2 weeks at [facility]. Call with any questions or concerns.     Increase activity slowly as tolerated    Complete by:  As directed      TED hose    Complete by:  As directed   Use stockings (TED hose) for 2 weeks on both leg(s).  You may remove them at night for sleeping.     Weight bearing as tolerated    Complete by:  As directed   Laterality:  right  Extremity:  Lower             Medication List    STOP taking these medications        ibuprofen 200 MG tablet  Commonly known as:  ADVIL,MOTRIN      TAKE these medications        aspirin 325 MG EC tablet  Take 1 tablet (325 mg total) by mouth 2 (two) times daily.     bisoprolol-hydrochlorothiazide 5-6.25 MG per tablet  Commonly known as:  ZIAC  Take 1 tablet by mouth every morning.  Start taking on:  06/22/2015     CINNAMON PO  Take 1,000 mg by mouth daily.     docusate sodium 100 MG capsule  Commonly known as:  COLACE  Take 1 capsule (100 mg total)  by mouth 2 (two) times daily.     ferrous sulfate 325 (65 FE) MG tablet  Take 1 tablet (325 mg total) by mouth 3 (three) times daily after meals.     HYDROcodone-acetaminophen 7.5-325 MG per tablet  Commonly known as:  NORCO  Take 1-2 tablets by mouth every 4 (four) hours as needed for moderate pain.     methocarbamol 500 MG tablet  Commonly known as:  ROBAXIN  Take 1 tablet (500 mg total) by mouth every 6 (six) hours as needed for muscle  spasms.     multivitamin with minerals Tabs tablet  Take 1 tablet by mouth daily.     OVER THE COUNTER MEDICATION  Take 1 tablet by mouth daily. Magnesium with zinc     OVER THE COUNTER MEDICATION  Take 1 tablet by mouth daily. Balance B50     polyethylene glycol packet  Commonly known as:  MIRALAX / GLYCOLAX  Take 17 g by mouth 2 (two) times daily.     vitamin C 500 MG tablet  Commonly known as:  ASCORBIC ACID  Take 1,000 mg by mouth daily.     VITAMIN D3 MAXIMUM STRENGTH 5000 UNITS capsule  Generic drug:  Cholecalciferol  Take 10,000 Units by mouth daily.         Signed: West Pugh. Siddalee Vanderheiden   PA-C  06/13/2015, 9:54 AM

## 2015-09-06 ENCOUNTER — Encounter: Payer: Self-pay | Admitting: Internal Medicine

## 2015-09-06 ENCOUNTER — Ambulatory Visit (INDEPENDENT_AMBULATORY_CARE_PROVIDER_SITE_OTHER): Payer: BLUE CROSS/BLUE SHIELD | Admitting: Internal Medicine

## 2015-09-06 VITALS — BP 130/72 | HR 68 | Temp 98.1°F | Resp 16 | Ht 69.5 in | Wt 249.4 lb

## 2015-09-06 DIAGNOSIS — E782 Mixed hyperlipidemia: Secondary | ICD-10-CM | POA: Diagnosis not present

## 2015-09-06 DIAGNOSIS — Z6836 Body mass index (BMI) 36.0-36.9, adult: Secondary | ICD-10-CM | POA: Diagnosis not present

## 2015-09-06 DIAGNOSIS — R7309 Other abnormal glucose: Secondary | ICD-10-CM

## 2015-09-06 DIAGNOSIS — Z79899 Other long term (current) drug therapy: Secondary | ICD-10-CM | POA: Diagnosis not present

## 2015-09-06 DIAGNOSIS — I10 Essential (primary) hypertension: Secondary | ICD-10-CM

## 2015-09-06 DIAGNOSIS — E559 Vitamin D deficiency, unspecified: Secondary | ICD-10-CM

## 2015-09-06 LAB — CBC WITH DIFFERENTIAL/PLATELET
Basophils Absolute: 0.1 10*3/uL (ref 0.0–0.1)
Basophils Relative: 1 % (ref 0–1)
Eosinophils Absolute: 0.2 10*3/uL (ref 0.0–0.7)
Eosinophils Relative: 3 % (ref 0–5)
HEMATOCRIT: 45.9 % (ref 39.0–52.0)
Hemoglobin: 15.3 g/dL (ref 13.0–17.0)
Lymphocytes Relative: 32 % (ref 12–46)
Lymphs Abs: 2.3 10*3/uL (ref 0.7–4.0)
MCH: 30 pg (ref 26.0–34.0)
MCHC: 33.3 g/dL (ref 30.0–36.0)
MCV: 90 fL (ref 78.0–100.0)
MONO ABS: 0.9 10*3/uL (ref 0.1–1.0)
MONOS PCT: 12 % (ref 3–12)
MPV: 9.9 fL (ref 8.6–12.4)
Neutro Abs: 3.7 10*3/uL (ref 1.7–7.7)
Neutrophils Relative %: 52 % (ref 43–77)
Platelets: 212 10*3/uL (ref 150–400)
RBC: 5.1 MIL/uL (ref 4.22–5.81)
RDW: 14.2 % (ref 11.5–15.5)
WBC: 7.1 10*3/uL (ref 4.0–10.5)

## 2015-09-06 LAB — LIPID PANEL
Cholesterol: 134 mg/dL (ref 125–200)
HDL: 31 mg/dL — ABNORMAL LOW (ref >=40)
LDL Cholesterol: 65 mg/dL (ref <130)
Total CHOL/HDL Ratio: 4.3 Ratio (ref <=5.0)
Triglycerides: 189 mg/dL — ABNORMAL HIGH (ref <150)
VLDL: 38 mg/dL — AB (ref <30)

## 2015-09-06 LAB — BASIC METABOLIC PANEL WITH GFR
BUN: 20 mg/dL (ref 7–25)
CALCIUM: 9.5 mg/dL (ref 8.6–10.3)
CO2: 30 mmol/L (ref 20–31)
Chloride: 101 mmol/L (ref 98–110)
Creat: 0.82 mg/dL (ref 0.70–1.25)
GFR, Est African American: >89 mL/min (ref >=60)
GFR, Est Non African American: >89 mL/min (ref >=60)
GLUCOSE: 119 mg/dL — AB (ref 65–99)
Potassium: 4.4 mmol/L (ref 3.5–5.3)
Sodium: 139 mmol/L (ref 135–146)

## 2015-09-06 LAB — HEPATIC FUNCTION PANEL
ALT: 22 U/L (ref 9–46)
AST: 17 U/L (ref 10–35)
Albumin: 4.3 g/dL (ref 3.6–5.1)
Alkaline Phosphatase: 83 U/L (ref 40–115)
BILIRUBIN INDIRECT: 0.6 mg/dL (ref 0.2–1.2)
Bilirubin, Direct: 0.1 mg/dL (ref <=0.2)
TOTAL PROTEIN: 6.7 g/dL (ref 6.1–8.1)
Total Bilirubin: 0.7 mg/dL (ref 0.2–1.2)

## 2015-09-06 LAB — MAGNESIUM: Magnesium: 1.8 mg/dL (ref 1.5–2.5)

## 2015-09-06 LAB — TSH: TSH: 2.776 u[IU]/mL (ref 0.350–4.500)

## 2015-09-06 MED ORDER — PHENTERMINE HCL 37.5 MG PO TABS: ORAL_TABLET | ORAL | Status: DC

## 2015-09-06 NOTE — Progress Notes (Signed)
Patient ID: Aaron Burns, male   DOB: 02-15-1956, 59 y.o.   MRN: [id number]   This very nice 59 y.o. MWM presents for 3 month follow up with Hypertension, Hyperlipidemia, Pre-Diabetes and Vitamin D Deficiency. Patient had Rt TKR on 06/06/2015 by Dr Alvan Dame and just returned to work last week & is doing great.    Patient is treated for HTN & BP has been controlled at home. Today's BP: 130/72 mmHg. Patient has had no complaints of any cardiac type chest pain, palpitations, dyspnea/orthopnea/PND, dizziness, claudication, or dependent edema.   Hyperlipidemia is controlled with diet & meds. Patient denies myalgias or other med SE's. Last Lipids were at goal - Cholesterol 131; HDL 40; LDL 62; Triglycerides 146 on 05/30/2015.   Also, the patient has history of Morbid Obesity (BMI 36+) and consequent PreDiabetes in 2009  with A1c 6.0% and 6.3% in 2013 and has had no symptoms of reactive hypoglycemia, diabetic polys, paresthesias or visual blurring.  Last A1c was  6.0% on 05/30/2015. Patient has had success with weight loss in the past on phentermine and desires a refill.    Further, the patient also has history of Vitamin D Deficiency of 35 in 2008 and supplements vitamin D without any suspected side-effects. Last vitamin D was 78 on 05/30/2015.  Medication Sig  . bisoprolol-hctz (ZIAC) 5-6.25 MG Take 1 tablet by mouth every morning.  [name] VITAMIN D3  5000 UNITS  Take 10,000 Units by mouth daily.   [name] CINNAMON PO Take 1,000 mg by mouth daily.  Lebron Quam 7.5-325 Take 1-2 tablets by mouth every 4 (four) hours as needed for moderate pain.  [name] MULTIVITAMIN WITH MINERALS Take 1 tablet by mouth daily.  [name] OVER THE COUNTER MEDICATION Take 1 tablet by mouth daily. Magnesium with zinc  . OVER THE COUNTER MEDICATION Take 1 tablet by mouth daily. Balance B50  . vitamin C (ASCORBIC ACID) 500 MG tablet Take 1,000 mg by mouth daily.  [name] docusate sodium (COLACE) 100 MG capsule Take 1 capsule (100 mg total) by mouth 2 (two) times daily.   . ferrous sulfate 325 (65 FE) MG tablet Take 1 tablet (325 mg total) by mouth 3 (three) times daily after meals.  . methocarbamol (ROBAXIN) 500 MG tablet Take 1 tablet (500 mg total) by mouth every 6 (six) hours as needed for muscle spasms.  . polyethylene glycol (MIRALAX)  Take 17 g by mouth 2 (two) times daily.   Allergies  Allergen Reactions  . Ppd [Tuberculin Purified Protein Derivative]     Pt states he is not allergic to this.Marland Kitchenpositive tb test 2014, pt took tb treatment 2014   PMHx:   Past Medical History  Diagnosis Date  . Hypertension   . Heart murmur     at birth  . Elevated hemoglobin A1c   . 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 History of kidney stones 2011   Immunization History  Administered Date(s) Administered  . Influenza Split 12/01/2013  . Td 12/17/2003  . Tdap 02/14/2015   Past Surgical History  Procedure Laterality Date  . Hernia repair      as a baby  . Cystoscopy/retrograde/ureteroscopy/stone extraction with basket  5/11  . Wisdom tooth extraction  yrs ago  . Knee surgery Right 11/25/11 and 2015    arthroscopic, cortisone injections done also  . Cholecystectomy  07/27/2012    Procedure: LAPAROSCOPIC CHOLECYSTECTOMY WITH INTRAOPERATIVE CHOLANGIOGRAM;  Surgeon: Joyice Faster. Cornett, MD;  Location: [facility];  Service: General;  Laterality: N/A;  . Total knee arthroplasty Right 06/06/2015    Procedure: RIGHT TOTAL KNEE ARTHROPLASTY;  Surgeon: Paralee Cancel, MD;  Location: [facility];  Service: Orthopedics;  Laterality: Right;   FHx:    Reviewed / unchanged  SHx:    Reviewed / unchanged  Systems Review:  Constitutional: Denies fever, chills, wt changes, headaches, insomnia, fatigue, night sweats, change in appetite. Eyes: Denies redness, blurred vision, diplopia, discharge, itchy, watery eyes.  ENT: Denies discharge, congestion, post nasal drip, epistaxis, sore throat, earache, hearing loss, dental pain,  tinnitus, vertigo, sinus pain, snoring.  CV: Denies chest pain, palpitations, irregular heartbeat, syncope, dyspnea, diaphoresis, orthopnea, PND, claudication or edema. Respiratory: denies cough, dyspnea, DOE, pleurisy, hoarseness, laryngitis, wheezing.  Gastrointestinal: Denies dysphagia, odynophagia, heartburn, reflux, water brash, abdominal pain or cramps, nausea, vomiting, bloating, diarrhea, constipation, hematemesis, melena, hematochezia  or hemorrhoids. Genitourinary: Denies dysuria, frequency, urgency, nocturia, hesitancy, discharge, hematuria or flank pain. Musculoskeletal: Denies arthralgias, myalgias, stiffness, jt. swelling, pain, limping or strain/sprain.  Skin: Denies pruritus, rash, hives, warts, acne, eczema or change in skin lesion(s). Neuro: No weakness, tremor, incoordination, spasms, paresthesia or pain. Psychiatric: Denies confusion, memory loss or sensory loss. Endo: Denies change in weight, skin or hair change.  Heme/Lymph: No excessive bleeding, bruising or enlarged lymph nodes.  Physical Exam  BP 130/72   Pulse 68  Temp 98.1 F   Resp 16  Ht 5' 9.5"   Wt 249 lb 6.4 oz     BMI 36.31   Appears well nourished and in no distress. Eyes: PERRLA, EOMs, conjunctiva no swelling or erythema. Sinuses: No frontal/maxillary tenderness ENT/Mouth: EAC's clear, TM's nl w/o erythema, bulging. Nares clear w/o erythema, swelling, exudates. Oropharynx clear without erythema or exudates. Oral hygiene is good. Tongue normal, non obstructing. Hearing intact.  Neck: Supple. Thyroid nl. Car 2+/2+ without bruits, nodes or JVD. Chest: Respirations nl with BS clear & equal w/o rales, rhonchi, wheezing or stridor.  Cor: Heart sounds normal w/ regular rate and rhythm without sig. murmurs, gallops, clicks, or rubs. Peripheral pulses normal and equal  without edema.  Abdomen: Soft & bowel sounds normal. Non-tender w/o guarding, rebound, hernias, masses, or organomegaly.  Lymphatics:  Unremarkable.  Musculoskeletal: Full ROM all peripheral extremities, joint stability, 5/5 strength, and normal gait. Vertical scar over anterior right knee is well healed with slight calor & w/o tenderness or STS. Skin: Warm, dry without exposed rashes, lesions or ecchymosis apparent.  Neuro: Cranial nerves intact, reflexes equal bilaterally. Sensory-motor testing grossly intact. Tendon reflexes grossly intact.  Pysch: Alert & oriented x 3.  Insight and judgement nl & appropriate. No ideations.  Assessment and Plan:  1. Essential hypertension  - TSH  2. Mixed hyperlipidemia  - Lipid panel  3. Elevated hemoglobin A1c  - Hemoglobin A1c - Insulin, random  4. Vitamin D deficiency  - Vit D  25 hydroxy (rtn osteoporosis monitoring)  5. BMI 36.0-36.9,adult  - phentermine (ADIPEX-P) 37.5 MG tablet; Take 1/2 to 1 tablet every morning for dieting & weight loss  Disp: 30 tablet; Refill: 5  6. Medication management  - CBC with Differential/Platelet - BASIC METABOLIC PANEL WITH GFR - Hepatic function panel - Magnesium   Recommended regular exercise, BP monitoring, weight control, and discussed med and SE's. Recommended labs to assess and monitor clinical status. Further disposition pending results of labs. Over 30 minutes of exam, counseling, chart review was performed

## 2015-09-06 NOTE — Patient Instructions (Signed)

## 2015-09-07 LAB — INSULIN, RANDOM: Insulin: 17.6 u[IU]/mL (ref 2.0–19.6)

## 2015-09-07 LAB — HEMOGLOBIN A1C
HEMOGLOBIN A1C: 6.3 % — AB (ref <5.7)
MEAN PLASMA GLUCOSE: 134 mg/dL — AB (ref <117)

## 2015-09-07 LAB — VITAMIN D 25 HYDROXY (VIT D DEFICIENCY, FRACTURES): Vit D, 25-Hydroxy: 76 ng/mL (ref 30–100)

## 2015-12-15 ENCOUNTER — Ambulatory Visit (INDEPENDENT_AMBULATORY_CARE_PROVIDER_SITE_OTHER): Payer: BLUE CROSS/BLUE SHIELD | Admitting: Physician Assistant

## 2015-12-15 ENCOUNTER — Encounter: Payer: Self-pay | Admitting: Physician Assistant

## 2015-12-15 VITALS — BP 130/70 | HR 82 | Temp 97.3°F | Resp 16 | Ht 69.5 in | Wt 254.0 lb

## 2015-12-15 DIAGNOSIS — R7309 Other abnormal glucose: Secondary | ICD-10-CM | POA: Diagnosis not present

## 2015-12-15 DIAGNOSIS — Z96651 Presence of right artificial knee joint: Secondary | ICD-10-CM | POA: Diagnosis not present

## 2015-12-15 DIAGNOSIS — Z0001 Encounter for general adult medical examination with abnormal findings: Secondary | ICD-10-CM

## 2015-12-15 DIAGNOSIS — R6889 Other general symptoms and signs: Secondary | ICD-10-CM | POA: Diagnosis not present

## 2015-12-15 DIAGNOSIS — E559 Vitamin D deficiency, unspecified: Secondary | ICD-10-CM | POA: Diagnosis not present

## 2015-12-15 DIAGNOSIS — Z79899 Other long term (current) drug therapy: Secondary | ICD-10-CM | POA: Diagnosis not present

## 2015-12-15 DIAGNOSIS — I10 Essential (primary) hypertension: Secondary | ICD-10-CM | POA: Diagnosis not present

## 2015-12-15 DIAGNOSIS — J0100 Acute maxillary sinusitis, unspecified: Secondary | ICD-10-CM

## 2015-12-15 DIAGNOSIS — E782 Mixed hyperlipidemia: Secondary | ICD-10-CM

## 2015-12-15 DIAGNOSIS — N529 Male erectile dysfunction, unspecified: Secondary | ICD-10-CM

## 2015-12-15 LAB — LIPID PANEL
Cholesterol: 105 mg/dL — ABNORMAL LOW (ref 125–200)
HDL: 27 mg/dL — AB (ref >=40)
LDL CALC: 50 mg/dL (ref <130)
Total CHOL/HDL Ratio: 3.9 Ratio (ref <=5.0)
Triglycerides: 138 mg/dL (ref <150)
VLDL: 28 mg/dL (ref <30)

## 2015-12-15 LAB — BASIC METABOLIC PANEL WITH GFR
BUN: 14 mg/dL (ref 7–25)
CO2: 29 mmol/L (ref 20–31)
CREATININE: 0.7 mg/dL (ref 0.70–1.25)
Calcium: 9.4 mg/dL (ref 8.6–10.3)
Chloride: 103 mmol/L (ref 98–110)
GFR, Est African American: >89 mL/min (ref >=60)
GFR, Est Non African American: >89 mL/min (ref >=60)
Glucose, Bld: 168 mg/dL — ABNORMAL HIGH (ref 65–99)
Potassium: 3.9 mmol/L (ref 3.5–5.3)
Sodium: 138 mmol/L (ref 135–146)

## 2015-12-15 LAB — HEPATIC FUNCTION PANEL
ALK PHOS: 80 U/L (ref 40–115)
ALT: 24 U/L (ref 9–46)
AST: 20 U/L (ref 10–35)
Albumin: 3.9 g/dL (ref 3.6–5.1)
Bilirubin, Direct: 0.1 mg/dL (ref <=0.2)
Indirect Bilirubin: 0.3 mg/dL (ref 0.2–1.2)
TOTAL PROTEIN: 6.4 g/dL (ref 6.1–8.1)
Total Bilirubin: 0.4 mg/dL (ref 0.2–1.2)

## 2015-12-15 LAB — CBC WITH DIFFERENTIAL/PLATELET
Basophils Absolute: 0 10*3/uL (ref 0.0–0.1)
Basophils Relative: 0 % (ref 0–1)
EOS ABS: 0.2 10*3/uL (ref 0.0–0.7)
Eosinophils Relative: 2 % (ref 0–5)
HCT: 44 % (ref 39.0–52.0)
Hemoglobin: 14.7 g/dL (ref 13.0–17.0)
Lymphocytes Relative: 22 % (ref 12–46)
Lymphs Abs: 2.4 10*3/uL (ref 0.7–4.0)
MCH: 29.9 pg (ref 26.0–34.0)
MCHC: 33.4 g/dL (ref 30.0–36.0)
MCV: 89.6 fL (ref 78.0–100.0)
MPV: 10.1 fL (ref 8.6–12.4)
Monocytes Absolute: 1.3 10*3/uL — ABNORMAL HIGH (ref 0.1–1.0)
Monocytes Relative: 12 % (ref 3–12)
NEUTROS PCT: 64 % (ref 43–77)
Neutro Abs: 7 10*3/uL (ref 1.7–7.7)
Platelets: 211 10*3/uL (ref 150–400)
RBC: 4.91 MIL/uL (ref 4.22–5.81)
RDW: 13.8 % (ref 11.5–15.5)
WBC: 11 10*3/uL — ABNORMAL HIGH (ref 4.0–10.5)

## 2015-12-15 LAB — MAGNESIUM: MAGNESIUM: 1.8 mg/dL (ref 1.5–2.5)

## 2015-12-15 LAB — TSH: TSH: 1.405 u[IU]/mL (ref 0.350–4.500)

## 2015-12-15 MED ORDER — AZITHROMYCIN 250 MG PO TABS: ORAL_TABLET | ORAL | Status: AC

## 2015-12-15 MED ORDER — NYSTATIN 100000 UNIT/GM EX CREA: 1.0000 "application " | TOPICAL_CREAM | Freq: Two times a day (BID) | CUTANEOUS | Status: DC

## 2015-12-15 MED ORDER — PREDNISONE 20 MG PO TABS: ORAL_TABLET | ORAL | Status: DC

## 2015-12-15 MED ORDER — SILDENAFIL CITRATE 100 MG PO TABS: 100.0000 mg | ORAL_TABLET | ORAL | Status: DC | PRN

## 2015-12-15 MED ORDER — FLUTICASONE PROPIONATE 50 MCG/ACT NA SUSP: 2.0000 | Freq: Every day | NASAL | Status: DC

## 2015-12-15 NOTE — Patient Instructions (Signed)

## 2015-12-15 NOTE — Progress Notes (Signed)
Complete Physical  Assessment and Plan: 1. Essential hypertension - continue medications, DASH diet, exercise and monitor at home. Call if greater than 130/80.  - CBC with Differential/Platelet - BASIC METABOLIC PANEL WITH GFR - Hepatic function panel - TSH - DG Chest 2 View; Future  2. Mixed hyperlipidemia -continue medications, check lipids, decrease fatty foods, increase activity.  - Lipid panel  3. Elevated hemoglobin A1c Discussed general issues about diabetes pathophysiology and management., Educational material distributed., Suggested low cholesterol diet., Encouraged aerobic exercise., Discussed foot care., Reminded to get yearly retinal exam. - Hemoglobin A1c  4. Morbid obesity, unspecified obesity type ([place]) Obesity with co morbidities- long discussion about weight loss, diet, and exercise  5. Vitamin D deficiency - VITAMIN D 25 Hydroxy (Vit-D Deficiency, Fractures)  6. Medication management - Magnesium  7. S/P right TKA  8. Encounter for general adult medical examination with abnormal findings - CBC with Differential/Platelet - BASIC METABOLIC PANEL WITH GFR - Hepatic function panel - TSH - Lipid panel - Hemoglobin A1c - Magnesium - VITAMIN D 25 Hydroxy (Vit-D Deficiency, Fractures) - DG Chest 2 View; Future  9. Acute maxillary sinusitis, recurrence not specified - azithromycin (ZITHROMAX) 250 MG tablet; Take 2 tablets (500 mg) on  Day 1,  followed by 1 tablet (250 mg) once daily on Days 2 through 5.  Dispense: 6 each; Refill: 1 - predniSONE (DELTASONE) 20 MG tablet; 2 tablets daily for 3 days, 1 tablet daily for 4 days.  Dispense: 10 tablet; Refill: 0 - fluticasone (FLONASE) 50 MCG/ACT nasal spray; Place 2 sprays into both nostrils at bedtime.  Dispense: 16 g; Refill: 1  10. Erectile dysfunction, unspecified erectile dysfunction type ? Phimosis, treat with cream, will refer to urology - nystatin cream (MYCOSTATIN); Apply 1 application topically 2 (two)  times daily.  Dispense: 30 g; Refill: 1 - sildenafil (VIAGRA) 100 MG tablet; Take 1 tablet (100 mg total) by mouth as needed for erectile dysfunction.  Dispense: 10 tablet; Refill: 1  Discussed med's effects and SE's. Screening labs and tests as requested with regular follow-up as recommended. Over 40 minutes of exam, counseling, chart review and critical decision making was performed  HPI 59 y.o. obese WM presents for a complete physical.   His blood pressure has been controlled at home, today their BP is BP: 130/70 mmHg He does not workout, had right knee surgery in Sept. He denies chest pain, shortness of breath, dizziness.  He states that since the catheter placement he has been having an issue with his penis getting smaller, no pain with urination, no pain with ejaculation, no tenderness, warmth, redness.  Has had cold symptoms x 1 week, not getting better, green mucus, sinus pressure, cough, no fever, chills, on OTC cold medicine without help.  He is on cholesterol medication and denies myalgias. His cholesterol is at goal. The cholesterol last visit was:   Lab Results  Component Value Date   CHOL 134 09/06/2015   HDL 31* 09/06/2015   LDLCALC 65 09/06/2015   TRIG 189* 09/06/2015   CHOLHDL 4.3 09/06/2015   He has been working on diet and exercise for prediabetes, and denies paresthesia of the feet, polydipsia, polyuria and visual disturbances. Last A1C in the office was:  Lab Results  Component Value Date   HGBA1C 6.3* 09/06/2015  Patient is on Vitamin D supplement.   Lab Results  Component Value Date   VD25OH 76 09/06/2015     Last PSA was: Lab Results  Component Value Date  PSA 0.63 02/14/2015   BMI is Body mass index is 36.98 kg/(m^2)., he is working on diet and exercise. Has not been on phentermine Wt Readings from Last 3 Encounters:  12/15/15 254 lb (115.214 kg)  09/06/15 249 lb 6.4 oz (113.127 kg)  06/06/15 232 lb 6.4 oz (105.416 kg)    Current Medications:   Current Outpatient Prescriptions on File Prior to Visit  Medication Sig Dispense Refill  . bisoprolol-hydrochlorothiazide (ZIAC) 5-6.25 MG per tablet Take 1 tablet by mouth every morning.    . Cholecalciferol (VITAMIN D3 MAXIMUM STRENGTH) 5000 UNITS capsule Take 10,000 Units by mouth daily.     [name] CINNAMON PO Take 1,000 mg by mouth daily.    . Multiple Vitamin (MULTIVITAMIN WITH MINERALS) TABS Take 1 tablet by mouth daily.    [name] OVER THE COUNTER MEDICATION Take 1 tablet by mouth daily. Magnesium with zinc    . OVER THE COUNTER MEDICATION Take 1 tablet by mouth daily. Balance B50    . phentermine (ADIPEX-P) 37.5 MG tablet Take 1/2 to 1 tablet every morning for dieting & weightloss 30 tablet 5  . vitamin C (ASCORBIC ACID) 500 MG tablet Take 1,000 mg by mouth daily.     No current facility-administered medications on file prior to visit.   Health Maintenance:  Immunization History  Administered Date(s) Administered  . Influenza Split 12/01/2013  . Td 12/17/2003  . Tdap 02/14/2015    Tetanus: 2016 Pneumovax: N/A Prevnar 13: N/A Flu vaccine: 2014 declines due to sickness Zostavax:N/A DEXA: N/A Colonoscopy: 2007, DUE this year EGD: N/A Eye Exam: N/A Dentist: NA CXR 2013  Patient Care Team: Unk Pinto, MD as PCP - General (Internal Medicine)  Allergies:  Allergies  Allergen Reactions  . Ppd [Tuberculin Purified Protein Derivative]     Pt states he is not allergic to this.Marland Kitchenpositive tb test 2014, pt took tb treatment 2014   Medical History:  Past Medical History  Diagnosis Date  . Hypertension   . Heart murmur     at birth  . Elevated hemoglobin A1c   . 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 History of kidney stones 2011  . Morbid obesity (BMI 36. 06/07/2015   Surgical History:  Past Surgical History  Procedure Laterality Date  . Hernia repair      as a baby  . Cystoscopy/retrograde/ureteroscopy/stone extraction  with basket  5/11  . Wisdom tooth extraction  yrs ago  . Knee surgery Right 11/25/11 and 2015    arthroscopic, cortisone injections done also  . Cholecystectomy  07/27/2012    Procedure: LAPAROSCOPIC CHOLECYSTECTOMY WITH INTRAOPERATIVE CHOLANGIOGRAM;  Surgeon: Joyice Faster. Cornett, MD;  Location: [facility];  Service: General;  Laterality: N/A;  . Total knee arthroplasty Right 06/06/2015    Procedure: RIGHT TOTAL KNEE ARTHROPLASTY;  Surgeon: Paralee Cancel, MD;  Location: [facility];  Service: Orthopedics;  Laterality: Right;   Family History:  Family History  Problem Relation Age of Onset  . Hypertension Mother   . Diabetes Mother   . Kidney failure Mother   . Diabetes Father   . Diabetes Sister   . Diabetes Brother    Social History:   Social History  Substance Use Topics  . Smoking status: Former Smoker -- 1.00 packs/day for 30 years    Types: Cigarettes    Quit date: 12/16/2013  . Smokeless tobacco: Former Systems developer    Quit date: 08/16/2014  . Alcohol Use: 0.0 oz/week  0 Standard drinks or equivalent per week     Comment: occasional   Review of Systems:  Review of Systems  Constitutional: Positive for chills. Negative for fever, malaise/fatigue and diaphoresis.  HENT: Positive for congestion and sore throat.   Eyes: Negative.   Respiratory: Positive for cough and sputum production. Negative for shortness of breath.   Cardiovascular: Negative.   Gastrointestinal: Negative.   Genitourinary: Negative.   Musculoskeletal: Positive for myalgias. Negative for back pain, joint pain, falls and neck pain.  Neurological: Negative.   Psychiatric/Behavioral: Negative for depression.    Physical Exam: Estimated body mass index is 36.98 kg/(m^2) as calculated from the following:   Height as of this encounter: 5' 9.5" (1.765 m).   Weight as of this encounter: 254 lb (115.214 kg). BP 130/70 mmHg  Pulse 82  Temp(Src) 97.3 F (36.3 C) (Temporal)  Resp 16  Ht 5' 9.5" (1.765 m)  Wt 254 lb (115.214  kg)  BMI 36.98 kg/m2  SpO2 92% General Appearance: Well nourished, in no apparent distress.  Eyes: PERRLA, EOMs, conjunctiva no swelling or erythema, normal fundi and vessels.  Sinuses: + Frontal/maxillary tenderness  ENT/Mouth: Ext aud canals clear, normal light reflex with TMs without erythema, bulging. Good dentition. No erythema, swelling, or exudate on post pharynx. Tonsils not swollen or erythematous. Hearing normal.  Neck: Supple, thyroid normal. No bruits  Respiratory: Respiratory effort normal, BS equal bilaterally with mild expiratory wheeze without rales, rhonchi, or stridor.  Cardio: RRR without murmurs, rubs or gallops. Brisk peripheral pulses with mild bilateral edema.  Chest: symmetric, with normal excursions and percussion.  Abdomen: Soft, nontender, no guarding, rebound, hernias, masses, or organomegaly.  Lymphatics: Non tender without lymphadenopathy.  Genitourinary: Penis uncircumscribed, foreskin retracted without difficulty, mild erythema around head of penis without discharge, swelling, tenderness.  Musculoskeletal: Full ROM all peripheral extremities,5/5 strength, and normal gait.well healing right knee scar.   Skin: Warm, dry without rashes, lesions, ecchymosis. Neuro: Cranial nerves intact, reflexes equal bilaterally. Normal muscle tone, no cerebellar symptoms. Sensation intact.  Psych: Awake and oriented X 3, normal affect, Insight and Judgment appropriate.   EKG: defer AORTA SCAN: defer  Aaron Burns 10:11 AM [facility]

## 2015-12-16 LAB — HEMOGLOBIN A1C
HEMOGLOBIN A1C: 6.7 % — AB (ref <5.7)
MEAN PLASMA GLUCOSE: 146 mg/dL — AB (ref <117)

## 2015-12-16 LAB — VITAMIN D 25 HYDROXY (VIT D DEFICIENCY, FRACTURES): VIT D 25 HYDROXY: 89 ng/mL (ref 30–100)

## 2015-12-19 ENCOUNTER — Ambulatory Visit (HOSPITAL_COMMUNITY)
Admission: RE | Admit: 2015-12-19 | Discharge: 2015-12-19 | Disposition: A | Discharge status: Home / Self Care | Payer: BLUE CROSS/BLUE SHIELD | Source: Ambulatory Visit | Attending: Physician Assistant | Admitting: Physician Assistant

## 2015-12-19 DIAGNOSIS — R0989 Other specified symptoms and signs involving the circulatory and respiratory systems: Secondary | ICD-10-CM | POA: Insufficient documentation

## 2015-12-19 DIAGNOSIS — I10 Essential (primary) hypertension: Secondary | ICD-10-CM

## 2015-12-19 DIAGNOSIS — Z0001 Encounter for general adult medical examination with abnormal findings: Secondary | ICD-10-CM

## 2015-12-19 DIAGNOSIS — R05 Cough: Secondary | ICD-10-CM | POA: Diagnosis not present

## 2016-01-04 ENCOUNTER — Other Ambulatory Visit: Payer: Self-pay | Admitting: Internal Medicine

## 2016-02-28 ENCOUNTER — Encounter: Payer: Self-pay | Admitting: Internal Medicine

## 2016-02-28 ENCOUNTER — Ambulatory Visit (INDEPENDENT_AMBULATORY_CARE_PROVIDER_SITE_OTHER): Payer: BLUE CROSS/BLUE SHIELD | Admitting: Internal Medicine

## 2016-02-28 VITALS — BP 122/76 | HR 64 | Temp 97.5°F | Resp 16 | Ht 70.0 in | Wt 253.4 lb

## 2016-02-28 DIAGNOSIS — Z125 Encounter for screening for malignant neoplasm of prostate: Secondary | ICD-10-CM

## 2016-02-28 DIAGNOSIS — Z79899 Other long term (current) drug therapy: Secondary | ICD-10-CM | POA: Diagnosis not present

## 2016-02-28 DIAGNOSIS — R5383 Other fatigue: Secondary | ICD-10-CM

## 2016-02-28 DIAGNOSIS — I10 Essential (primary) hypertension: Secondary | ICD-10-CM

## 2016-02-28 DIAGNOSIS — E782 Mixed hyperlipidemia: Secondary | ICD-10-CM

## 2016-02-28 DIAGNOSIS — E559 Vitamin D deficiency, unspecified: Secondary | ICD-10-CM

## 2016-02-28 DIAGNOSIS — R7309 Other abnormal glucose: Secondary | ICD-10-CM

## 2016-02-28 DIAGNOSIS — Z0001 Encounter for general adult medical examination with abnormal findings: Secondary | ICD-10-CM

## 2016-02-28 DIAGNOSIS — Z1212 Encounter for screening for malignant neoplasm of rectum: Secondary | ICD-10-CM

## 2016-02-28 LAB — CBC WITH DIFFERENTIAL/PLATELET
BASOS ABS: 0 10*3/uL (ref 0.0–0.1)
Basophils Relative: 0 % (ref 0–1)
Eosinophils Absolute: 0.3 10*3/uL (ref 0.0–0.7)
Eosinophils Relative: 3 % (ref 0–5)
HCT: 49.1 % (ref 39.0–52.0)
HEMOGLOBIN: 17 g/dL (ref 13.0–17.0)
LYMPHS ABS: 3.3 10*3/uL (ref 0.7–4.0)
Lymphocytes Relative: 36 % (ref 12–46)
MCH: 31.4 pg (ref 26.0–34.0)
MCHC: 34.6 g/dL (ref 30.0–36.0)
MCV: 90.8 fL (ref 78.0–100.0)
MONOS PCT: 12 % (ref 3–12)
MPV: 10.2 fL (ref 8.6–12.4)
Monocytes Absolute: 1.1 10*3/uL — ABNORMAL HIGH (ref 0.1–1.0)
NEUTROS PCT: 49 % (ref 43–77)
Neutro Abs: 4.5 10*3/uL (ref 1.7–7.7)
Platelets: 236 10*3/uL (ref 150–400)
RBC: 5.41 MIL/uL (ref 4.22–5.81)
RDW: 14.1 % (ref 11.5–15.5)
WBC: 9.2 10*3/uL (ref 4.0–10.5)

## 2016-02-28 LAB — LIPID PANEL
CHOL/HDL RATIO: 4.8 ratio (ref <=5.0)
Cholesterol: 139 mg/dL (ref 125–200)
HDL: 29 mg/dL — AB (ref >=40)
LDL CALC: 44 mg/dL (ref <130)
Triglycerides: 332 mg/dL — ABNORMAL HIGH (ref <150)
VLDL: 66 mg/dL — ABNORMAL HIGH (ref <30)

## 2016-02-28 LAB — BASIC METABOLIC PANEL WITH GFR
BUN: 23 mg/dL (ref 7–25)
CO2: 30 mmol/L (ref 20–31)
CREATININE: 1.01 mg/dL (ref 0.70–1.25)
Calcium: 10.3 mg/dL (ref 8.6–10.3)
Chloride: 101 mmol/L (ref 98–110)
GFR, Est African American: >89 mL/min (ref >=60)
GFR, Est Non African American: 80 mL/min (ref >=60)
Glucose, Bld: 151 mg/dL — ABNORMAL HIGH (ref 65–99)
Potassium: 3.9 mmol/L (ref 3.5–5.3)
Sodium: 139 mmol/L (ref 135–146)

## 2016-02-28 LAB — TSH: TSH: 3.92 mIU/L (ref 0.40–4.50)

## 2016-02-28 LAB — MAGNESIUM: MAGNESIUM: 2 mg/dL (ref 1.5–2.5)

## 2016-02-28 LAB — HEPATIC FUNCTION PANEL
ALBUMIN: 3.9 g/dL (ref 3.6–5.1)
ALK PHOS: 74 U/L (ref 40–115)
ALT: 26 U/L (ref 9–46)
AST: 20 U/L (ref 10–35)
BILIRUBIN DIRECT: 0.1 mg/dL (ref <=0.2)
BILIRUBIN TOTAL: 0.3 mg/dL (ref 0.2–1.2)
Indirect Bilirubin: 0.2 mg/dL (ref 0.2–1.2)
Total Protein: 6.8 g/dL (ref 6.1–8.1)

## 2016-02-28 LAB — IRON AND TIBC
%SAT: 18 % (ref 15–60)
IRON: 69 ug/dL (ref 50–180)
TIBC: 373 ug/dL (ref 250–425)
UIBC: 304 ug/dL (ref 125–400)

## 2016-02-28 LAB — VITAMIN B12: VITAMIN B 12: 706 pg/mL (ref 200–1100)

## 2016-02-28 NOTE — Patient Instructions (Signed)

## 2016-02-28 NOTE — Progress Notes (Signed)
Patient ID: Aaron Burns, male   DOB: 07-Jun-1956, 60 y.o.   MRN: [id number]  Annual  Screening/Preventative Visit And Comprehensive Evaluation & Examination  This very nice 60 y.o. MWM presents for a Wellness/Preventative Visit & comprehensive evaluation and management of multiple medical co-morbidities.  Patient has been followed for HTN, T2_NIDDM, Hyperlipidemia and Vitamin D Deficiency. In June 2016 , patient underwent R TKR.    HTN predates since 2008 and had a negative Stress Myoview at that time.  Patient's BP has been controlled at home.Today's BP: 122/76 mmHg. Patient denies any cardiac symptoms as chest pain, palpitations, shortness of breath, dizziness or ankle swelling.   Patient's hyperlipidemia is controlled with diet and medications. Patient denies myalgias or other medication SE's. Last lipids were at goal with Cholesterol 105*; HDL 27*; LDL 50; Triglycerides 138 on 12/15/2015.    Patient has Morbid Obesity (BMI 36+) since 2009 with A1c 6.0% and most recent A1c 6.7% in Dec 2016 was then in the diabetic range meeting the criteria for T2_NIDDM  which patient is attempting to control with diet.  Patient denies reactive hypoglycemic symptoms, visual blurring, diabetic polys or paresthesias.   Finally, patient has history of Vitamin D Deficiency of "35" in 2008  and last vitamin D was 89 on 12/15/2015.   Medication Sig  . bisoprolol-hctz (ZIAC) 5-6.25  TAKE ONE TAB EVERY MORNING   . VITAMIN D 5000 UNITS  Take 10,000 Units by mouth daily.   [name] CINNAMON  Take 1,000 mg by mouth daily.  [name] FLONASE nasal spray Place 2 sprays into both nostrils at bedtime.  . MULTIVITAMIN WITH MINERALS Take 1 tablet by mouth daily.  [name] nystatin cream  Apply 1 application topically 2 (two) times daily.  . Magnesium with zinc Take 1 tablet by mouth daily.   . Balance B50 Take 1 tablet by mouth daily.   . phentermine (ADIPEX-P) 37.5 MG tablet Take 1/2 to 1 tablet every morning for dieting & weightloss  . sildenafil   100 MG tablet Take 1 tab as needed   . vitamin C  500 MG tablet Take 1,000 mg by mouth daily.   Allergies  Allergen Reactions  . Ppd [Tuberculin Purified Protein Derivative]     Pt states he is not allergic to this.Marland Kitchenpositive tb test 2014, pt took tb treatment 2014   Past Medical History  Diagnosis Date  . Hypertension   . Heart murmur     at birth  . Elevated hemoglobin A1c   . 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 History of kidney stones 2011  . Morbid obesity (BMI 36. 06/07/2015   Health Maintenance  Topic Date Due  . COLONOSCOPY  12/16/2004  . ZOSTAVAX  12/16/2014  . INFLUENZA VACCINE  07/17/2015  . TETANUS/TDAP  02/13/2025  . Hepatitis C Screening  Completed  . HIV Screening  Completed   Immunization History  Administered Date(s) Administered  . Influenza Split 12/01/2013  . Td 12/17/2003  . Tdap 02/14/2015   Past Surgical History  Procedure Laterality Date  . Hernia repair      as a baby  . Cystoscopy/retrograde/ureteroscopy/stone extraction with basket  5/11  . Wisdom tooth extraction  yrs ago  . Knee surgery Right 11/25/11 and 2015    arthroscopic, cortisone injections done also  . Cholecystectomy  07/27/2012    Procedure: LAPAROSCOPIC CHOLECYSTECTOMY WITH INTRAOPERATIVE CHOLANGIOGRAM;  Surgeon: Joyice Faster. Cornett, MD;  Location: [facility];  Service:  General;  Laterality: N/A;  . Total knee arthroplasty Right 06/06/2015    Procedure: RIGHT TOTAL KNEE ARTHROPLASTY;  Surgeon: Paralee Cancel, MD;  Location: [facility];  Service: Orthopedics;  Laterality: Right;   Family History  Problem Relation Age of Onset  . Hypertension Mother   . Diabetes Mother   . Kidney failure Mother   . Diabetes Father   . Diabetes Sister   . Diabetes Brother    Social History   Social History  . Marital Status: Married    Spouse Name: N/A  . Number of Children: N/A  . Years of Education: N/A   Occupational History  . Forklift  operator   Social History Main Topics  . Smoking status: Former Smoker -- 1.00 packs/day for 30 years    Types: Cigarettes    Quit date: 12/16/2013  . Smokeless tobacco: Former Systems developer    Quit date: 08/16/2014  . Alcohol Use: 0.0 oz/week    0 Standard drinks or equivalent per week     Comment: occasional  . Drug Use: No  . Sexual Activity: Active    ROS Constitutional: Denies fever, chills, weight loss/gain, headaches, insomnia,  night sweats or change in appetite. Does c/o fatigue. Eyes: Denies redness, blurred vision, diplopia, discharge, itchy or watery eyes.  ENT: Denies discharge, congestion, post nasal drip, epistaxis, sore throat, earache, hearing loss, dental pain, Tinnitus, Vertigo, Sinus pain or snoring.  Cardio: Denies chest pain, palpitations, irregular heartbeat, syncope, dyspnea, diaphoresis, orthopnea, PND, claudication or edema Respiratory: denies cough, dyspnea, DOE, pleurisy, hoarseness, laryngitis or wheezing.  Gastrointestinal: Denies dysphagia, heartburn, reflux, water brash, pain, cramps, nausea, vomiting, bloating, diarrhea, constipation, hematemesis, melena, hematochezia, jaundice or hemorrhoids Genitourinary: Denies dysuria, frequency, urgency, nocturia, hesitancy, discharge, hematuria or flank pain Musculoskeletal: Denies arthralgia, myalgia, stiffness, Jt. Swelling, pain, limp or strain/sprain. Denies Falls. Skin: Denies puritis, rash, hives, warts, acne, eczema or change in skin lesion Neuro: No weakness, tremor, incoordination, spasms, paresthesia or pain Psychiatric: Denies confusion, memory loss or sensory loss. Denies Depression. Endocrine: Denies change in weight, skin, hair change, nocturia, and paresthesia, diabetic polys, visual blurring or hyper / hypo glycemic episodes.  Heme/Lymph: No excessive bleeding, bruising or enlarged lymph nodes.  Physical Exam  BP 122/76 mmHg  Pulse 64  Temp(Src) 97.5 F (36.4 C)  Resp 16  Ht 5\' 10"  (1.778 m)  Wt 253  lb 6.4 oz (114.941 kg)  BMI 36.36 kg/m2  General Appearance: Well nourished, in no apparent distress. Eyes: PERRLA, EOMs, conjunctiva no swelling or erythema, normal fundi and vessels. Sinuses: No frontal/maxillary tenderness ENT/Mouth: EACs patent / TMs  nl. Nares clear without erythema, swelling, mucoid exudates. Oral hygiene is good. No erythema, swelling, or exudate. Tongue normal, non-obstructing. Tonsils not swollen or erythematous. Hearing normal.  Neck: Supple, thyroid normal. No bruits, nodes or JVD. Respiratory: Respiratory effort normal.  BS equal and clear bilateral without rales, rhonci, wheezing or stridor. Cardio: Heart sounds are normal with regular rate and rhythm and no murmurs, rubs or gallops. Peripheral pulses are normal and equal bilaterally without edema. No aortic or femoral bruits. Chest: symmetric with normal excursions and percussion.  Abdomen: Soft, with Nl bowel sounds. Nontender, no guarding, rebound, hernias, masses, or organomegaly.  Lymphatics: Non tender without lymphadenopathy.  Genitourinary: No hernias.Testes nl. DRE - prostate nl for age - smooth & firm w/o nodules. Musculoskeletal: Full ROM all peripheral extremities, joint stability, 5/5 strength, and normal gait. Skin: Warm and dry without rashes, lesions, cyanosis, clubbing or  ecchymosis.  Neuro: Cranial nerves intact, reflexes equal bilaterally. Normal muscle tone, no cerebellar symptoms. Sensation intact.  Pysch: Alert and oriented X 3 with normal affect, insight and judgment appropriate.   Assessment and Plan  1. Annual Preventative/Screening Exam    - Microalbumin / creatinine urine ratio - EKG 12-Lead - [place], RETROPERITNL ABD,  LTD - POC Hemoccult Bld/Stl  - Urinalysis, Routine w reflex microscopic  - Vitamin B12 - Iron and TIBC - PSA - Testosterone - CBC with Differential/Platelet - BASIC METABOLIC PANEL WITH GFR - Hepatic function panel - Magnesium - Lipid panel - TSH - Hemoglobin  A1c - Insulin, random - VITAMIN D 25 Hydroxy   2. Essential hypertension  - Microalbumin / creatinine urine ratio - EKG 12-Lead - [place], RETROPERITNL ABD,  LTD - TSH  3. Mixed hyperlipidemia  - Lipid panel - TSH  4. Elevated hemoglobin A1c  /  (T2_DM)  - Hemoglobin A1c - Insulin, random  5. Vitamin D deficiency  - VITAMIN D 25 Hydroxy   6. Morbid obesity ([place])   7. Prostate cancer screening  - PSA  8. Screening for rectal cancer  - POC Hemoccult Bld/Stl   9. Other fatigue  - Vitamin B12 - Iron and TIBC - Testosterone - CBC with Differential/Platelet - TSH  10. Medication management  - Urinalysis, Routine w reflex microscopic  - CBC with Differential/Platelet - BASIC METABOLIC PANEL WITH GFR - Hepatic function panel - Magnesium   Continue prudent diet as discussed, weight control, BP monitoring, regular exercise, and medications as discussed.  Discussed med effects and SE's. Routine screening labs and tests as requested with regular follow-up as recommended. Over 40 minutes of exam, counseling, chart review and high complex critical decision making was performed

## 2016-02-29 ENCOUNTER — Other Ambulatory Visit: Payer: Self-pay | Admitting: Internal Medicine

## 2016-02-29 DIAGNOSIS — E119 Type 2 diabetes mellitus without complications: Secondary | ICD-10-CM

## 2016-02-29 LAB — URINALYSIS, ROUTINE W REFLEX MICROSCOPIC
Bilirubin Urine: NEGATIVE
Glucose, UA: NEGATIVE
HGB URINE DIPSTICK: NEGATIVE
Ketones, ur: NEGATIVE
Leukocytes, UA: NEGATIVE
Nitrite: NEGATIVE
PH: 6 (ref 5.0–8.0)
Protein, ur: NEGATIVE
Specific Gravity, Urine: 1.022 (ref 1.001–1.035)

## 2016-02-29 LAB — VITAMIN D 25 HYDROXY (VIT D DEFICIENCY, FRACTURES): Vit D, 25-Hydroxy: 59 ng/mL (ref 30–100)

## 2016-02-29 LAB — HEMOGLOBIN A1C
HEMOGLOBIN A1C: 7 % — AB (ref <5.7)
Mean Plasma Glucose: 154 mg/dL — ABNORMAL HIGH (ref <117)

## 2016-02-29 LAB — TESTOSTERONE: Testosterone: 376 ng/dL (ref 250–827)

## 2016-02-29 LAB — INSULIN, RANDOM: Insulin: 49.9 u[IU]/mL — ABNORMAL HIGH (ref 2.0–19.6)

## 2016-02-29 LAB — MICROALBUMIN / CREATININE URINE RATIO
CREATININE, URINE: 112 mg/dL (ref 20–370)
Microalb Creat Ratio: 7 mcg/mg creat (ref <30)
Microalb, Ur: 0.8 mg/dL

## 2016-02-29 LAB — PSA: PSA: 0.63 ng/mL (ref <=4.00)

## 2016-02-29 MED ORDER — METFORMIN HCL ER 500 MG PO TB24: ORAL_TABLET | ORAL | Status: DC

## 2016-03-12 ENCOUNTER — Other Ambulatory Visit: Payer: Self-pay | Admitting: Internal Medicine

## 2016-03-24 DIAGNOSIS — R05 Cough: Secondary | ICD-10-CM | POA: Diagnosis not present

## 2016-03-24 DIAGNOSIS — J209 Acute bronchitis, unspecified: Secondary | ICD-10-CM | POA: Diagnosis not present

## 2016-04-16 ENCOUNTER — Other Ambulatory Visit: Payer: Self-pay | Admitting: *Deleted

## 2016-04-16 DIAGNOSIS — Z1212 Encounter for screening for malignant neoplasm of rectum: Secondary | ICD-10-CM

## 2016-04-16 DIAGNOSIS — Z0001 Encounter for general adult medical examination with abnormal findings: Secondary | ICD-10-CM

## 2016-04-16 LAB — POC HEMOCCULT BLD/STL (HOME/3-CARD/SCREEN)
Card #2 Fecal Occult Blod, POC: NEGATIVE
Card #3 Fecal Occult Blood, POC: NEGATIVE
FECAL OCCULT BLD: NEGATIVE

## 2016-06-03 ENCOUNTER — Encounter: Payer: Self-pay | Admitting: Internal Medicine

## 2016-06-03 ENCOUNTER — Ambulatory Visit (INDEPENDENT_AMBULATORY_CARE_PROVIDER_SITE_OTHER): Payer: BLUE CROSS/BLUE SHIELD | Admitting: Internal Medicine

## 2016-06-03 VITALS — BP 138/82 | HR 64 | Temp 98.0°F | Resp 18 | Ht 70.0 in | Wt 254.0 lb

## 2016-06-03 DIAGNOSIS — Z79899 Other long term (current) drug therapy: Secondary | ICD-10-CM | POA: Diagnosis not present

## 2016-06-03 DIAGNOSIS — R7309 Other abnormal glucose: Secondary | ICD-10-CM | POA: Diagnosis not present

## 2016-06-03 DIAGNOSIS — E782 Mixed hyperlipidemia: Secondary | ICD-10-CM

## 2016-06-03 DIAGNOSIS — E119 Type 2 diabetes mellitus without complications: Secondary | ICD-10-CM

## 2016-06-03 DIAGNOSIS — E559 Vitamin D deficiency, unspecified: Secondary | ICD-10-CM | POA: Diagnosis not present

## 2016-06-03 DIAGNOSIS — Z6836 Body mass index (BMI) 36.0-36.9, adult: Secondary | ICD-10-CM

## 2016-06-03 DIAGNOSIS — I10 Essential (primary) hypertension: Secondary | ICD-10-CM

## 2016-06-03 LAB — BASIC METABOLIC PANEL WITH GFR
BUN: 19 mg/dL (ref 7–25)
CHLORIDE: 105 mmol/L (ref 98–110)
CO2: 31 mmol/L (ref 20–31)
CREATININE: 0.74 mg/dL (ref 0.70–1.25)
Calcium: 9.8 mg/dL (ref 8.6–10.3)
GFR, Est African American: >89 mL/min (ref >=60)
GFR, Est Non African American: >89 mL/min (ref >=60)
Glucose, Bld: 108 mg/dL — ABNORMAL HIGH (ref 65–99)
POTASSIUM: 5.7 mmol/L — AB (ref 3.5–5.3)
SODIUM: 142 mmol/L (ref 135–146)

## 2016-06-03 LAB — LIPID PANEL
Cholesterol: 148 mg/dL (ref 125–200)
HDL: 42 mg/dL (ref >=40)
LDL CALC: 76 mg/dL (ref <130)
Total CHOL/HDL Ratio: 3.5 Ratio (ref <=5.0)
Triglycerides: 150 mg/dL — ABNORMAL HIGH (ref <150)
VLDL: 30 mg/dL (ref <30)

## 2016-06-03 LAB — HEPATIC FUNCTION PANEL
ALK PHOS: 63 U/L (ref 40–115)
ALT: 24 U/L (ref 9–46)
AST: 35 U/L (ref 10–35)
Albumin: 4.4 g/dL (ref 3.6–5.1)
BILIRUBIN DIRECT: 0.1 mg/dL (ref <=0.2)
BILIRUBIN TOTAL: 0.4 mg/dL (ref 0.2–1.2)
Indirect Bilirubin: 0.3 mg/dL (ref 0.2–1.2)
Total Protein: 7.1 g/dL (ref 6.1–8.1)

## 2016-06-03 LAB — CBC WITH DIFFERENTIAL/PLATELET
BASOS ABS: 0 {cells}/uL (ref 0–200)
BASOS PCT: 0 %
EOS ABS: 71 {cells}/uL (ref 15–500)
EOS PCT: 1 %
HCT: 48.3 % (ref 38.5–50.0)
HEMOGLOBIN: 16.2 g/dL (ref 13.2–17.1)
LYMPHS ABS: 2272 {cells}/uL (ref 850–3900)
Lymphocytes Relative: 32 %
MCH: 30.4 pg (ref 27.0–33.0)
MCHC: 33.5 g/dL (ref 32.0–36.0)
MCV: 90.6 fL (ref 80.0–100.0)
MONOS PCT: 9 %
MPV: 11.5 fL (ref 7.5–12.5)
Monocytes Absolute: 639 cells/uL (ref 200–950)
NEUTROS ABS: 4118 {cells}/uL (ref 1500–7800)
Neutrophils Relative %: 58 %
PLATELETS: 209 10*3/uL (ref 140–400)
RBC: 5.33 MIL/uL (ref 4.20–5.80)
RDW: 14.2 % (ref 11.0–15.0)
WBC: 7.1 10*3/uL (ref 3.8–10.8)

## 2016-06-03 LAB — TSH: TSH: 1.67 mIU/L (ref 0.40–4.50)

## 2016-06-03 LAB — HEMOGLOBIN A1C
HEMOGLOBIN A1C: 6.6 % — AB (ref <5.7)
MEAN PLASMA GLUCOSE: 143 mg/dL

## 2016-06-03 MED ORDER — PHENTERMINE HCL 37.5 MG PO TABS
ORAL_TABLET | ORAL | Status: DC
Start: 2016-06-03 — End: 2016-12-13

## 2016-06-03 MED ORDER — BISOPROLOL-HYDROCHLOROTHIAZIDE 5-6.25 MG PO TABS: 1.0000 | ORAL_TABLET | Freq: Every day | ORAL | Status: DC

## 2016-06-03 MED ORDER — METFORMIN HCL ER 500 MG PO TB24: ORAL_TABLET | ORAL | Status: DC

## 2016-06-03 NOTE — Progress Notes (Signed)
Assessment and Plan:  Hypertension:  -Cont ziac refill given -patient not currently taking viagra -Continue medication -monitor blood pressure at home. -Continue DASH diet -Reminder to go to the ER if any CP, SOB, nausea, dizziness, severe HA, changes vision/speech, left arm numbness and tingling and jaw pain.  Cholesterol - Continue diet and exercise -Check cholesterol.    Diabetes without complications  -cont metformin -Continue diet and exercise.  -Check A1C  Vitamin D Def -continue medications.   Morbic obesity -diet and exercise discussed today -will restart phentermine  Continue diet and meds as discussed. Further disposition pending results of labs. Discussed med's effects and SE's.    HPI 60 y.o. male  presents for 3 month follow up with hypertension, hyperlipidemia, diabetes and vitamin D deficiency.   His blood pressure has been controlled at home, today their BP is BP: 138/82 mmHg.He does not workout. He denies chest pain, shortness of breath, dizziness.   He is on cholesterol medication and denies myalgias. His cholesterol is at goal. The cholesterol was:  02/28/2016: Cholesterol 139; HDL 29*; LDL Cholesterol 44; Triglycerides 332*   He has not been working on diet and exercise for diabetes without complications, he is on bASA, he is on ACE/ARB, and denies  foot ulcerations, hyperglycemia, hypoglycemia , increased appetite, nausea, paresthesia of the feet, polydipsia, polyuria, visual disturbances, vomiting and weight loss. Last A1C was: 02/28/2016: Hgb A1c MFr Bld 7.0* .  He notes that he is still struggling with weight.  He reports that he has been trying to work on his portion sizes.  He would like to go back on his phentermine.  He reports that he has been faithfully taking his metformin.  He is not checking blood sugars.     Patient is on Vitamin D supplement. 02/28/2016: Vit D, 25-Hydroxy 59  He reports that he is still golfing.  His knee does bother him  sometimes, but he reports that he is able to walk half and ride half.  He is aware that his weight gain and lack of diet is contributing to some of his knee pain.    Current Medications:  Current Outpatient Prescriptions on File Prior to Visit  Medication Sig Dispense Refill  . bisoprolol-hydrochlorothiazide (ZIAC) 5-6.25 MG tablet TAKE ONE TABLET BY MOUTH EVERY MORNING FOR BLOOD PRESSURE 90 tablet 1  . Cholecalciferol (VITAMIN D3 MAXIMUM STRENGTH) 5000 UNITS capsule Take 10,000 Units by mouth daily.     [name] CINNAMON PO Take 1,000 mg by mouth daily.    . fluticasone (FLONASE) 50 MCG/ACT nasal spray Place 2 sprays into both nostrils at bedtime. 16 g 1  . metFORMIN (GLUCOPHAGE XR) 500 MG 24 hr tablet Take 4 tablets daily - 1 tablet with Breakfast and Lunch & 2 tablets with Supper for Diabetes 360 tablet 1  . Multiple Vitamin (MULTIVITAMIN WITH MINERALS) TABS Take 1 tablet by mouth daily.    [name] nystatin cream (MYCOSTATIN) Apply 1 application topically 2 (two) times daily. 30 g 1  . OVER THE COUNTER MEDICATION Take 1 tablet by mouth daily. Magnesium with zinc    . OVER THE COUNTER MEDICATION Take 1 tablet by mouth daily. Balance B50    . sildenafil (VIAGRA) 100 MG tablet Take 1 tablet (100 mg total) by mouth as needed for erectile dysfunction. 10 tablet 1  . vitamin C (ASCORBIC ACID) 500 MG tablet Take 1,000 mg by mouth daily.     No current facility-administered medications on file prior to visit.   Medical  History:  Past Medical History  Diagnosis Date  . Hypertension   . Heart murmur     at birth  . Elevated hemoglobin A1c   . 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 History of kidney stones 2011  . Morbid obesity (BMI 36. 06/07/2015   Allergies:  Allergies  Allergen Reactions  . Ppd [Tuberculin Purified Protein Derivative]     Pt states he is not allergic to this.Marland Kitchenpositive tb test 2014, pt took tb treatment 2014     Review of  Systems:  Review of Systems  Constitutional: Negative for fever, chills and malaise/fatigue.  HENT: Negative for congestion, ear pain and sore throat.   Respiratory: Negative for cough, shortness of breath and wheezing.   Cardiovascular: Negative for chest pain, palpitations and leg swelling.  Gastrointestinal: Negative for heartburn, abdominal pain, diarrhea, constipation, blood in stool and melena.  Genitourinary: Negative.   Skin: Negative.   Neurological: Negative for dizziness, sensory change, loss of consciousness and headaches.  Psychiatric/Behavioral: Negative for depression. The patient is not nervous/anxious and does not have insomnia.     Family history- Review and unchanged  Social history- Review and unchanged  Physical Exam: BP 138/82 mmHg  Pulse 64  Temp(Src) 98 F (36.7 C) (Temporal)  Resp 18  Ht 5\' 10"  (1.778 m)  Wt 254 lb (115.214 kg)  BMI 36.45 kg/m2 Wt Readings from Last 3 Encounters:  06/03/16 254 lb (115.214 kg)  02/28/16 253 lb 6.4 oz (114.941 kg)  12/15/15 254 lb (115.214 kg)   General Appearance: Well nourished well developed, non-toxic appearing, in no apparent distress. Eyes: PERRLA, EOMs, conjunctiva no swelling or erythema ENT/Mouth: Ear canals clear with no erythema, swelling, or discharge.  TMs normal bilaterally, oropharynx clear, moist, with no exudate.   Neck: Supple, thyroid normal, no JVD, no cervical adenopathy.  Respiratory: Respiratory effort normal, breath sounds clear A&P, no wheeze, rhonchi or rales noted.  No retractions, no accessory muscle usage Cardio: RRR with no MRGs. No noted edema.  Abdomen: Soft, + BS.  Non tender, no guarding, rebound, hernias, masses. Musculoskeletal: Full ROM, 5/5 strength, Normal gait Skin: Warm, dry without rashes, lesions, ecchymosis.  Neuro: Awake and oriented X 3, Cranial nerves intact. No cerebellar symptoms.  Psych: normal affect, Insight and Judgment appropriate.    Starlyn Skeans,  PA-C 10:42 AM [facility]

## 2016-07-01 DIAGNOSIS — Z471 Aftercare following joint replacement surgery: Secondary | ICD-10-CM | POA: Diagnosis not present

## 2016-07-01 DIAGNOSIS — Z96651 Presence of right artificial knee joint: Secondary | ICD-10-CM | POA: Diagnosis not present

## 2016-09-04 ENCOUNTER — Ambulatory Visit: Payer: Self-pay | Admitting: Internal Medicine

## 2016-09-08 NOTE — Progress Notes (Signed)
Assessment and Plan:  Hypertension:  -Cont ziac refill given -Continue medication -monitor blood pressure at home. -Continue DASH diet -Reminder to go to the ER if any CP, SOB, nausea, dizziness, severe HA, changes vision/speech, left arm numbness and tingling and jaw pain.  Cholesterol - Continue diet and exercise -Check cholesterol.    Diabetes without complications  -cont metformin -Continue diet and exercise.  -Check A1C  Vitamin D Def -continue medications.   Morbid obesity Obesity with co morbidities- long discussion about weight loss, diet, and exercise  Continue diet and meds as discussed. Further disposition pending results of labs. Discussed med's effects and SE's.    HPI 60 y.o. male  presents for 3 month follow up with hypertension, hyperlipidemia, diabetes and vitamin D deficiency.   His blood pressure has been controlled at home, today their BP is BP: 138/82.He does not workout. He denies chest pain, shortness of breath, dizziness.   He is on cholesterol medication and denies myalgias. His cholesterol is at goal. The cholesterol was:  06/03/2016: Cholesterol 148; HDL 42; LDL Cholesterol 76; Triglycerides 150   He has not been working on diet and exercise for diabetes without complications, he is on bASA, he is on ACE/ARB, and denies  foot ulcerations, hyperglycemia, hypoglycemia , increased appetite, nausea, paresthesia of the feet, polydipsia, polyuria, visual disturbances, vomiting and weight loss. Last A1C was: 06/03/2016: Hgb A1c MFr Bld 6.6 .     Patient is on Vitamin D supplement. 02/28/2016: Vit D, 25-Hydroxy 59  BMI is Body mass index is 35.27 kg/m., he is working on diet and exercise. Wt Readings from Last 3 Encounters:  09/09/16 245 lb 12.8 oz (111.5 kg)  06/03/16 254 lb (115.2 kg)  02/28/16 253 lb 6.4 oz (114.9 kg)   He has been having issues with ED since his knee surgery, has seen a urologist. States him and his wife have not had sex in 2-3 years,  having marital issues.   Current Medications:  Current Outpatient Prescriptions on File Prior to Visit  Medication Sig Dispense Refill  . bisoprolol-hydrochlorothiazide (ZIAC) 5-6.25 MG tablet Take 1 tablet by mouth daily. 90 tablet 1  . Cholecalciferol (VITAMIN D3 MAXIMUM STRENGTH) 5000 UNITS capsule Take 10,000 Units by mouth daily.     [name] CINNAMON PO Take 1,000 mg by mouth daily.    . metFORMIN (GLUCOPHAGE XR) 500 MG 24 hr tablet Take 4 tablets daily - 1 tablet with Breakfast and Lunch & 2 tablets with Supper for Diabetes 360 tablet 1  . Multiple Vitamin (MULTIVITAMIN WITH MINERALS) TABS Take 1 tablet by mouth daily.    [name] nystatin cream (MYCOSTATIN) Apply 1 application topically 2 (two) times daily. 30 g 1  . OVER THE COUNTER MEDICATION Take 1 tablet by mouth daily. Magnesium with zinc    . OVER THE COUNTER MEDICATION Take 1 tablet by mouth daily. Balance B50    . phentermine (ADIPEX-P) 37.5 MG tablet Take 1/2 to 1 tablet every morning for dieting & weightloss 30 tablet 5  . vitamin C (ASCORBIC ACID) 500 MG tablet Take 1,000 mg by mouth daily.     No current facility-administered medications on file prior to visit.    Medical History:  Past Medical History:  Diagnosis Date  . Elevated hemoglobin A1c   . Heart murmur    at birth  . History of kidney stones 2011  . Hypertension   . Morbid obesity (BMI 36. 06/07/2015  . 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 Ppd [Tuberculin Purified Protein Derivative]     Pt states he is not allergic to this.Marland Kitchenpositive tb test 2014, pt took tb treatment 2014     Review of Systems:  Review of Systems  Constitutional: Negative for fever, chills and malaise/fatigue.  HENT: Negative for congestion, ear pain and sore throat.   Respiratory: Negative for cough, shortness of breath and wheezing.   Cardiovascular: Negative for chest pain, palpitations and leg  swelling.  Gastrointestinal: Negative for heartburn, abdominal pain, diarrhea, constipation, blood in stool and melena.  Genitourinary: Negative.   Skin: Negative.   Neurological: Negative for dizziness, sensory change, loss of consciousness and headaches.  Psychiatric/Behavioral: Negative for depression. The patient is not nervous/anxious and does not have insomnia.     Family history- Review and unchanged  Social history- Review and unchanged  Physical Exam: BP 138/82   Pulse 64   Temp 97.7 F (36.5 C)   Resp 16   Ht 5\' 10"  (1.778 m)   Wt 245 lb 12.8 oz (111.5 kg)   BMI 35.27 kg/m  Wt Readings from Last 3 Encounters:  09/09/16 245 lb 12.8 oz (111.5 kg)  06/03/16 254 lb (115.2 kg)  02/28/16 253 lb 6.4 oz (114.9 kg)   General Appearance: Well nourished well developed, non-toxic appearing, in no apparent distress. Eyes: PERRLA, EOMs, conjunctiva no swelling or erythema ENT/Mouth: Ear canals clear with no erythema, swelling, or discharge.  TMs normal bilaterally, oropharynx clear, moist, with no exudate.   Neck: Supple, thyroid normal, no JVD, no cervical adenopathy.  Respiratory: Respiratory effort normal, breath sounds clear A&P, no wheeze, rhonchi or rales noted.  No retractions, no accessory muscle usage Cardio: RRR with no MRGs. No noted edema.  Abdomen: Soft, + BS.  Non tender, no guarding, rebound, hernias, masses. Musculoskeletal: Full ROM, 5/5 strength, Normal gait Skin: Warm, dry without rashes, lesions, ecchymosis.  Neuro: Awake and oriented X 3, Cranial nerves intact. No cerebellar symptoms.  Psych: normal affect, Insight and Judgment appropriate.    Vicie Mutters, PA-C 12:06 PM [facility]

## 2016-09-08 NOTE — Patient Instructions (Signed)

## 2016-09-09 ENCOUNTER — Encounter: Payer: Self-pay | Admitting: Internal Medicine

## 2016-09-09 ENCOUNTER — Ambulatory Visit (INDEPENDENT_AMBULATORY_CARE_PROVIDER_SITE_OTHER): Payer: BLUE CROSS/BLUE SHIELD | Admitting: Physician Assistant

## 2016-09-09 VITALS — BP 138/82 | HR 64 | Temp 97.7°F | Resp 16 | Ht 70.0 in | Wt 245.8 lb

## 2016-09-09 DIAGNOSIS — E559 Vitamin D deficiency, unspecified: Secondary | ICD-10-CM

## 2016-09-09 DIAGNOSIS — I10 Essential (primary) hypertension: Secondary | ICD-10-CM

## 2016-09-09 DIAGNOSIS — Z79899 Other long term (current) drug therapy: Secondary | ICD-10-CM

## 2016-09-09 DIAGNOSIS — E119 Type 2 diabetes mellitus without complications: Secondary | ICD-10-CM

## 2016-09-09 DIAGNOSIS — E782 Mixed hyperlipidemia: Secondary | ICD-10-CM

## 2016-09-09 LAB — BASIC METABOLIC PANEL WITH GFR
BUN: 22 mg/dL (ref 7–25)
CALCIUM: 10.3 mg/dL (ref 8.6–10.3)
CHLORIDE: 101 mmol/L (ref 98–110)
CO2: 27 mmol/L (ref 20–31)
CREATININE: 0.85 mg/dL (ref 0.70–1.25)
GFR, Est African American: >89 mL/min (ref >=60)
GFR, Est Non African American: >89 mL/min (ref >=60)
GLUCOSE: 156 mg/dL — AB (ref 65–99)
Potassium: 4 mmol/L (ref 3.5–5.3)
Sodium: 138 mmol/L (ref 135–146)

## 2016-09-09 LAB — HEPATIC FUNCTION PANEL
ALT: 50 U/L — AB (ref 9–46)
AST: 33 U/L (ref 10–35)
Albumin: 4.4 g/dL (ref 3.6–5.1)
Alkaline Phosphatase: 60 U/L (ref 40–115)
BILIRUBIN DIRECT: 0.1 mg/dL (ref <=0.2)
BILIRUBIN INDIRECT: 0.4 mg/dL (ref 0.2–1.2)
TOTAL PROTEIN: 7 g/dL (ref 6.1–8.1)
Total Bilirubin: 0.5 mg/dL (ref 0.2–1.2)

## 2016-09-09 LAB — LIPID PANEL
CHOL/HDL RATIO: 4.2 ratio (ref <=5.0)
CHOLESTEROL: 155 mg/dL (ref 125–200)
HDL: 37 mg/dL — ABNORMAL LOW (ref >=40)
LDL Cholesterol: 67 mg/dL (ref <130)
Triglycerides: 254 mg/dL — ABNORMAL HIGH (ref <150)
VLDL: 51 mg/dL — AB (ref <30)

## 2016-09-09 LAB — MAGNESIUM: MAGNESIUM: 1.9 mg/dL (ref 1.5–2.5)

## 2016-09-10 LAB — HEMOGLOBIN A1C
HEMOGLOBIN A1C: 6 % — AB (ref <5.7)
MEAN PLASMA GLUCOSE: 126 mg/dL

## 2016-09-10 LAB — CBC WITH DIFFERENTIAL/PLATELET
BASOS ABS: 0 {cells}/uL (ref 0–200)
Basophils Relative: 0 %
EOS PCT: 1 %
Eosinophils Absolute: 81 cells/uL (ref 15–500)
HCT: 49.6 % (ref 38.5–50.0)
HEMOGLOBIN: 16.7 g/dL (ref 13.2–17.1)
Lymphocytes Relative: 35 %
Lymphs Abs: 2835 cells/uL (ref 850–3900)
MCH: 31 pg (ref 27.0–33.0)
MCHC: 33.7 g/dL (ref 32.0–36.0)
MCV: 92.2 fL (ref 80.0–100.0)
MPV: 10.4 fL (ref 7.5–12.5)
Monocytes Absolute: 972 cells/uL — ABNORMAL HIGH (ref 200–950)
Monocytes Relative: 12 %
NEUTROS PCT: 52 %
Neutro Abs: 4212 cells/uL (ref 1500–7800)
PLATELETS: 195 10*3/uL (ref 140–400)
RBC: 5.38 MIL/uL (ref 4.20–5.80)
RDW: 14.1 % (ref 11.0–15.0)
WBC: 8.1 10*3/uL (ref 3.8–10.8)

## 2016-09-10 LAB — INSULIN, RANDOM: Insulin: 28.8 u[IU]/mL — ABNORMAL HIGH (ref 2.0–19.6)

## 2016-09-10 LAB — VITAMIN D 25 HYDROXY (VIT D DEFICIENCY, FRACTURES): Vit D, 25-Hydroxy: 100 ng/mL (ref 30–100)

## 2016-09-10 LAB — TSH: TSH: 3.7 m[IU]/L (ref 0.40–4.50)

## 2016-12-13 ENCOUNTER — Ambulatory Visit (INDEPENDENT_AMBULATORY_CARE_PROVIDER_SITE_OTHER): Payer: BLUE CROSS/BLUE SHIELD | Admitting: Physician Assistant

## 2016-12-13 ENCOUNTER — Encounter: Payer: Self-pay | Admitting: Physician Assistant

## 2016-12-13 ENCOUNTER — Encounter (INDEPENDENT_AMBULATORY_CARE_PROVIDER_SITE_OTHER): Payer: Self-pay

## 2016-12-13 VITALS — BP 132/74 | HR 76 | Temp 98.2°F | Resp 14 | Ht 70.0 in | Wt 258.6 lb

## 2016-12-13 DIAGNOSIS — Z79899 Other long term (current) drug therapy: Secondary | ICD-10-CM

## 2016-12-13 DIAGNOSIS — E782 Mixed hyperlipidemia: Secondary | ICD-10-CM | POA: Diagnosis not present

## 2016-12-13 DIAGNOSIS — E559 Vitamin D deficiency, unspecified: Secondary | ICD-10-CM | POA: Diagnosis not present

## 2016-12-13 DIAGNOSIS — I10 Essential (primary) hypertension: Secondary | ICD-10-CM | POA: Diagnosis not present

## 2016-12-13 DIAGNOSIS — E119 Type 2 diabetes mellitus without complications: Secondary | ICD-10-CM | POA: Diagnosis not present

## 2016-12-13 LAB — CBC WITH DIFFERENTIAL/PLATELET
Basophils Absolute: 0 cells/uL (ref 0–200)
Basophils Relative: 0 %
EOS ABS: 80 {cells}/uL (ref 15–500)
Eosinophils Relative: 1 %
HCT: 48.8 % (ref 38.5–50.0)
Hemoglobin: 16.2 g/dL (ref 13.2–17.1)
LYMPHS PCT: 24 %
Lymphs Abs: 1920 cells/uL (ref 850–3900)
MCH: 30.5 pg (ref 27.0–33.0)
MCHC: 33.2 g/dL (ref 32.0–36.0)
MCV: 91.9 fL (ref 80.0–100.0)
MONOS PCT: 10 %
MPV: 10.4 fL (ref 7.5–12.5)
Monocytes Absolute: 800 cells/uL (ref 200–950)
Neutro Abs: 5200 cells/uL (ref 1500–7800)
Neutrophils Relative %: 65 %
PLATELETS: 174 10*3/uL (ref 140–400)
RBC: 5.31 MIL/uL (ref 4.20–5.80)
RDW: 13.7 % (ref 11.0–15.0)
WBC: 8 10*3/uL (ref 3.8–10.8)

## 2016-12-13 LAB — TSH: TSH: 1.99 m[IU]/L (ref 0.40–4.50)

## 2016-12-13 MED ORDER — PHENTERMINE HCL 37.5 MG PO TABS: ORAL_TABLET | ORAL | 2 refills | Status: DC

## 2016-12-13 NOTE — Patient Instructions (Signed)
Simple math prevails.    1st - exercise does not produce significant weight loss - at best one converts fat into muscle , "bulks up", loses inches, but usually stays "weight neutral"     2nd - think of your body weightas a check book: If you eat more calories than you burn up - you save money or gain weight .... Or if you spend more money than you put in the check book, ie burn up more calories than you eat, then you lose weight     3rd - if you walk or run 1 mile, you burn up 100 calories - you have to burn up 3,500 calories to lose 1 pound, ie you have to walk/run 35 miles to lose 1 measly pound. So if you want to lose 10 #, then you have to walk/run 350 miles, so.... clearly exercise is not the solution.     4. So if you consume 1,500 calories, then you have to burn up the equivalent of 15 miles to stay weight neutral - It also stands to reason that if you consume 1,500 cal/day and don't lose weight, then you must be burning up about 1,500 cals/day to stay weight neutral.     5. If you really want to lose weight, you must cut your calorie intake 300 calories /day and at that rate you should lose about 1 # every 3 days.   6. Please purchase Dr Joel Fuhrman's book(s) "The End of Dieting" & "Eat to Live" . It has some great concepts and recipes.      

## 2016-12-13 NOTE — Progress Notes (Signed)
Assessment and Plan:  Hypertension:  - continue medications, DASH diet, exercise and monitor at home. Call if greater than 130/80.   Cholesterol -continue medications, check lipids, decrease fatty foods, increase activity.   Diabetes without complications  -Continue diet and exercise.  -Check A1C  Vitamin D Def -continue medications.   Morbid Obesity with co morbidities - long discussion about weight loss, diet, and exercise   Continue diet and meds as discussed. Further disposition pending results of labs. Discussed med's effects and SE's.    HPI 60 y.o. male  presents for 3 month follow up with hypertension, hyperlipidemia, diabetes and vitamin D deficiency.  Had teeth pulled Wednesday and had implants and partial placed, still getting use to it. Goes back to work the [employer].    His blood pressure has been controlled at home, today their BP is BP: 132/74.He does not workout. He denies chest pain, shortness of breath, dizziness.   He is on cholesterol medication and denies myalgias. His cholesterol is at goal. The cholesterol was:  09/09/2016: Cholesterol 155; HDL 37; LDL Cholesterol 67; Triglycerides 254   He has not been working on diet and exercise for diabetes without complications, he is on bASA, he is on ACE/ARB, and denies  foot ulcerations, hyperglycemia, hypoglycemia , increased appetite, nausea, paresthesia of the feet, polydipsia, polyuria, visual disturbances, vomiting and weight loss. Last A1C was: 09/09/2016: Hgb A1c MFr Bld 6.0 .    Patient is on Vitamin D supplement. 09/09/2016: Vit D, 25-Hydroxy 100  BMI is Body mass index is 37.11 kg/m., he is working on diet and exercise. He has been off x 1 month, he has gone back to 2nd shift 5 pm to 5 AM but will go back to 1st end of Jan Wt Readings from Last 3 Encounters:  12/13/16 258 lb 9.6 oz (117.3 kg)  09/09/16 245 lb 12.8 oz (111.5 kg)  06/03/16 254 lb (115.2 kg)    Current Medications:  Current Outpatient  Prescriptions on File Prior to Visit  Medication Sig Dispense Refill  . bisoprolol-hydrochlorothiazide (ZIAC) 5-6.25 MG tablet Take 1 tablet by mouth daily. 90 tablet 1  . Cholecalciferol (VITAMIN D3 MAXIMUM STRENGTH) 5000 UNITS capsule Take 10,000 Units by mouth daily.     [name] CINNAMON PO Take 1,000 mg by mouth daily.    . Multiple Vitamin (MULTIVITAMIN WITH MINERALS) TABS Take 1 tablet by mouth daily.    [name] nystatin cream (MYCOSTATIN) Apply 1 application topically 2 (two) times daily. 30 g 1  . OVER THE COUNTER MEDICATION Take 1 tablet by mouth daily. Magnesium with zinc    . OVER THE COUNTER MEDICATION Take 1 tablet by mouth daily. Balance B50    . phentermine (ADIPEX-P) 37.5 MG tablet Take 1/2 to 1 tablet every morning for dieting & weightloss 30 tablet 5  . vitamin C (ASCORBIC ACID) 500 MG tablet Take 1,000 mg by mouth daily.    . metFORMIN (GLUCOPHAGE XR) 500 MG 24 hr tablet Take 4 tablets daily - 1 tablet with Breakfast and Lunch & 2 tablets with Supper for Diabetes 360 tablet 1   No current facility-administered medications on file prior to visit.    Medical History:  Past Medical History:  Diagnosis Date  . Elevated hemoglobin A1c   . Heart murmur    at birth  . History of kidney stones 2011  . Hypertension   . Morbid obesity (BMI 36. 06/07/2015  . 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  HENT: Negative for congestion, ear pain and sore throat.   Respiratory: Negative for cough, shortness of breath and wheezing.   Cardiovascular: Negative for chest pain, palpitations and leg swelling.  Gastrointestinal: Negative for abdominal pain, blood in  stool, constipation, diarrhea, heartburn and melena.  Genitourinary: Negative.   Skin: Negative.   Neurological: Negative for dizziness, sensory change, loss of consciousness and headaches.  Psychiatric/Behavioral: Negative for depression. The patient is not nervous/anxious and does not have insomnia.     Family history- Review and unchanged  Social history- Review and unchanged  Physical Exam: BP 132/74   Pulse 76   Temp 98.2 F (36.8 C)   Resp 14   Ht 5\' 10"  (1.778 m)   Wt 258 lb 9.6 oz (117.3 kg)   SpO2 95%   BMI 37.11 kg/m  Wt Readings from Last 3 Encounters:  12/13/16 258 lb 9.6 oz (117.3 kg)  09/09/16 245 lb 12.8 oz (111.5 kg)  06/03/16 254 lb (115.2 kg)   General Appearance: Well nourished well developed, non-toxic appearing, in no apparent distress. Eyes: PERRLA, EOMs, conjunctiva no swelling or erythema ENT/Mouth: Ear canals clear with no erythema, swelling, or discharge.  TMs normal bilaterally, oropharynx clear, moist, with no exudate.   Neck: Supple, thyroid normal, no JVD, no cervical adenopathy.  Respiratory: Respiratory effort normal, breath sounds clear A&P, no wheeze, rhonchi or rales noted.  No retractions, no accessory muscle usage Cardio: RRR with no MRGs. No noted edema.  Abdomen: Soft, + BS.  Non tender, no guarding, rebound, hernias, masses. Musculoskeletal: Full ROM, 5/5 strength, Normal gait Skin: Warm, dry without rashes, lesions, ecchymosis.  Neuro: Awake and oriented X 3, Cranial nerves intact. No cerebellar symptoms.  Psych: normal affect, Insight and Judgment appropriate.    Vicie Mutters, PA-C 9:39 AM [facility]

## 2016-12-14 ENCOUNTER — Other Ambulatory Visit: Payer: Self-pay | Admitting: Physician Assistant

## 2016-12-14 DIAGNOSIS — R7989 Other specified abnormal findings of blood chemistry: Secondary | ICD-10-CM

## 2016-12-14 DIAGNOSIS — R945 Abnormal results of liver function studies: Principal | ICD-10-CM

## 2016-12-14 LAB — LIPID PANEL
CHOL/HDL RATIO: 3.9 ratio (ref <5.0)
Cholesterol: 156 mg/dL (ref <200)
HDL: 40 mg/dL — ABNORMAL LOW (ref >40)
LDL CALC: 72 mg/dL (ref <100)
Triglycerides: 222 mg/dL — ABNORMAL HIGH (ref <150)
VLDL: 44 mg/dL — ABNORMAL HIGH (ref <30)

## 2016-12-14 LAB — MAGNESIUM: Magnesium: 1.9 mg/dL (ref 1.5–2.5)

## 2016-12-14 LAB — HEPATIC FUNCTION PANEL
ALBUMIN: 4.1 g/dL (ref 3.6–5.1)
ALT: 56 U/L — ABNORMAL HIGH (ref 9–46)
AST: 40 U/L — ABNORMAL HIGH (ref 10–35)
Alkaline Phosphatase: 68 U/L (ref 40–115)
BILIRUBIN INDIRECT: 0.4 mg/dL (ref 0.2–1.2)
Bilirubin, Direct: 0.1 mg/dL (ref <=0.2)
TOTAL PROTEIN: 6.6 g/dL (ref 6.1–8.1)
Total Bilirubin: 0.5 mg/dL (ref 0.2–1.2)

## 2016-12-14 LAB — BASIC METABOLIC PANEL WITH GFR
BUN: 16 mg/dL (ref 7–25)
CHLORIDE: 103 mmol/L (ref 98–110)
CO2: 26 mmol/L (ref 20–31)
CREATININE: 0.77 mg/dL (ref 0.70–1.25)
Calcium: 9.8 mg/dL (ref 8.6–10.3)
GFR, Est African American: >89 mL/min (ref >=60)
GLUCOSE: 150 mg/dL — AB (ref 65–99)
Potassium: 4.3 mmol/L (ref 3.5–5.3)
SODIUM: 142 mmol/L (ref 135–146)

## 2016-12-14 LAB — HEMOGLOBIN A1C
HEMOGLOBIN A1C: 6.4 % — AB (ref <5.7)
MEAN PLASMA GLUCOSE: 137 mg/dL

## 2016-12-14 LAB — VITAMIN D 25 HYDROXY (VIT D DEFICIENCY, FRACTURES): Vit D, 25-Hydroxy: 78 ng/mL (ref 30–100)

## 2017-01-03 ENCOUNTER — Other Ambulatory Visit: Payer: Self-pay | Admitting: Internal Medicine

## 2017-01-03 NOTE — Telephone Encounter (Signed)
Please call phentermine 

## 2017-01-16 DIAGNOSIS — J209 Acute bronchitis, unspecified: Secondary | ICD-10-CM | POA: Diagnosis not present

## 2017-03-19 ENCOUNTER — Ambulatory Visit (INDEPENDENT_AMBULATORY_CARE_PROVIDER_SITE_OTHER): Payer: BLUE CROSS/BLUE SHIELD | Admitting: Internal Medicine

## 2017-03-19 ENCOUNTER — Encounter: Payer: Self-pay | Admitting: Internal Medicine

## 2017-03-19 VITALS — BP 128/84 | HR 68 | Temp 97.5°F | Resp 16 | Ht 70.0 in | Wt 244.8 lb

## 2017-03-19 DIAGNOSIS — Z0001 Encounter for general adult medical examination with abnormal findings: Secondary | ICD-10-CM

## 2017-03-19 DIAGNOSIS — Z0000 Encounter for general adult medical examination without abnormal findings: Secondary | ICD-10-CM | POA: Diagnosis not present

## 2017-03-19 DIAGNOSIS — R5383 Other fatigue: Secondary | ICD-10-CM

## 2017-03-19 DIAGNOSIS — Z79899 Other long term (current) drug therapy: Secondary | ICD-10-CM | POA: Diagnosis not present

## 2017-03-19 DIAGNOSIS — Z136 Encounter for screening for cardiovascular disorders: Secondary | ICD-10-CM

## 2017-03-19 DIAGNOSIS — Z125 Encounter for screening for malignant neoplasm of prostate: Secondary | ICD-10-CM

## 2017-03-19 DIAGNOSIS — E559 Vitamin D deficiency, unspecified: Secondary | ICD-10-CM | POA: Diagnosis not present

## 2017-03-19 DIAGNOSIS — I10 Essential (primary) hypertension: Secondary | ICD-10-CM

## 2017-03-19 DIAGNOSIS — E119 Type 2 diabetes mellitus without complications: Secondary | ICD-10-CM

## 2017-03-19 DIAGNOSIS — E782 Mixed hyperlipidemia: Secondary | ICD-10-CM

## 2017-03-19 DIAGNOSIS — Z1212 Encounter for screening for malignant neoplasm of rectum: Secondary | ICD-10-CM

## 2017-03-19 NOTE — Patient Instructions (Signed)
Preventive Care for Adults A healthy lifestyle and preventive care can promote health and wellness. Preventive health guidelines for men include the following key practices:  A routine yearly physical is a good way to check with your health care provider about your health and preventative screening. It is a chance to share any concerns and updates on your health and to receive a thorough exam.  Visit your dentist for a routine exam and preventative care every 6 months. Brush your teeth twice a day and floss once a day. Good oral hygiene prevents tooth decay and gum disease.  The frequency of eye exams is based on your age, health, family medical history, use of contact lenses, and other factors. Follow your health care provider's recommendations for frequency of eye exams.  Eat a healthy diet. Foods such as vegetables, fruits, whole grains, low-fat dairy products, and lean protein foods contain the nutrients you need without too many calories. Decrease your intake of foods high in solid fats, added sugars, and salt. Eat the right amount of calories for you.Get information about a proper diet from your health care provider, if necessary.  Regular physical exercise is one of the most important things you can do for your health. Most adults should get at least 150 minutes of moderate-intensity exercise (any activity that increases your heart rate and causes you to sweat) each week. In addition, most adults need muscle-strengthening exercises on 2 or more days a week.  Maintain a healthy weight. The body mass index (BMI) is a screening tool to identify possible weight problems. It provides an estimate of body fat based on height and weight. Your health care provider can find your BMI and can help you achieve or maintain a healthy weight.For adults 20 years and older:  A BMI below 18.5 is considered underweight.  A BMI of 18.5 to 24.9 is normal.  A BMI of 25 to 29.9 is considered overweight.  A BMI  of 30 and above is considered obese.  Maintain normal blood lipids and cholesterol levels by exercising and minimizing your intake of saturated fat. Eat a balanced diet with plenty of fruit and vegetables. Blood tests for lipids and cholesterol should begin at age 50 and be repeated every 5 years. If your lipid or cholesterol levels are high, you are over 50, or you are at high risk for heart disease, you may need your cholesterol levels checked more frequently.Ongoing high lipid and cholesterol levels should be treated with medicines if diet and exercise are not working.  If you smoke, find out from your health care provider how to quit. If you do not use tobacco, do not start.  Lung cancer screening is recommended for adults aged 73-80 years who are at high risk for developing lung cancer because of a history of smoking. A yearly low-dose CT scan of the lungs is recommended for people who have at least a 30-pack-year history of smoking and are a current smoker or have quit within the past 15 years. A pack year of smoking is smoking an average of 1 pack of cigarettes a day for 1 year (for example: 1 pack a day for 30 years or 2 packs a day for 15 years). Yearly screening should continue until the smoker has stopped smoking for at least 15 years. Yearly screening should be stopped for people who develop a health problem that would prevent them from having lung cancer treatment.  If you choose to drink alcohol, do not have more than  2 drinks per day. One drink is considered to be 12 ounces (355 mL) of beer, 5 ounces (148 mL) of wine, or 1.5 ounces (44 mL) of liquor.  Avoid use of street drugs. Do not share needles with anyone. Ask for help if you need support or instructions about stopping the use of drugs.  High blood pressure causes heart disease and increases the risk of stroke. Your blood pressure should be checked at least every 1-2 years. Ongoing high blood pressure should be treated with  medicines, if weight loss and exercise are not effective.  If you are 45-79 years old, ask your health care provider if you should take aspirin to prevent heart disease.  Diabetes screening involves taking a blood sample to check your fasting blood sugar level. This should be done once every 3 years, after age 45, if you are within normal weight and without risk factors for diabetes. Testing should be considered at a younger age or be carried out more frequently if you are overweight and have at least 1 risk factor for diabetes.  Colorectal cancer can be detected and often prevented. Most routine colorectal cancer screening begins at the age of 50 and continues through age 75. However, your health care provider may recommend screening at an earlier age if you have risk factors for colon cancer. On a yearly basis, your health care provider may provide home test kits to check for hidden blood in the stool. Use of a small camera at the end of a tube to directly examine the colon (sigmoidoscopy or colonoscopy) can detect the earliest forms of colorectal cancer. Talk to your health care provider about this at age 50, when routine screening begins. Direct exam of the colon should be repeated every 5-10 years through age 75, unless early forms of precancerous polyps or small growths are found.  People who are at an increased risk for hepatitis B should be screened for this virus. You are considered at high risk for hepatitis B if:  You were born in a country where hepatitis B occurs often. Talk with your health care provider about which countries are considered high risk.  Your parents were born in a high-risk country and you have not received a shot to protect against hepatitis B (hepatitis B vaccine).  You have HIV or AIDS.  You use needles to inject street drugs.  You live with, or have sex with, someone who has hepatitis B.  You are a man who has sex with other men (MSM).  You get hemodialysis  treatment.  You take certain medicines for conditions such as cancer, organ transplantation, and autoimmune conditions.  Hepatitis C blood testing is recommended for all people born from 1945 through 1965 and any individual with known risks for hepatitis C.  Practice safe sex. Use condoms and avoid high-risk sexual practices to reduce the spread of sexually transmitted infections (STIs). STIs include gonorrhea, chlamydia, syphilis, trichomonas, herpes, HPV, and human immunodeficiency virus (HIV). Herpes, HIV, and HPV are viral illnesses that have no cure. They can result in disability, cancer, and death.  If you are at risk of being infected with HIV, it is recommended that you take a prescription medicine daily to prevent HIV infection. This is called preexposure prophylaxis (PrEP). You are considered at risk if:  You are a man who has sex with other men (MSM) and have other risk factors.  You are a heterosexual man, are sexually active, and are at increased risk for HIV infection.    You take drugs by injection.  You are sexually active with a partner who has HIV.  Talk with your health care provider about whether you are at high risk of being infected with HIV. If you choose to begin PrEP, you should first be tested for HIV. You should then be tested every 3 months for as long as you are taking PrEP.  A one-time screening for abdominal aortic aneurysm (AAA) and surgical repair of large AAAs by ultrasound are recommended for men ages 32 to 67 years who are current or former smokers.  Healthy men should no longer receive prostate-specific antigen (PSA) blood tests as part of routine cancer screening. Talk with your health care provider about prostate cancer screening.  Testicular cancer screening is not recommended for adult males who have no symptoms. Screening includes self-exam, a health care provider exam, and other screening tests. Consult with your health care provider about any symptoms  you have or any concerns you have about testicular cancer.  Use sunscreen. Apply sunscreen liberally and repeatedly throughout the day. You should seek shade when your shadow is shorter than you. Protect yourself by wearing long sleeves, pants, a wide-brimmed hat, and sunglasses year round, whenever you are outdoors.  Once a month, do a whole-body skin exam, using a mirror to look at the skin on your back. Tell your health care provider about new moles, moles that have irregular borders, moles that are larger than a pencil eraser, or moles that have changed in shape or color.  Stay current with required vaccines (immunizations).  Influenza vaccine. All adults should be immunized every year.  Tetanus, diphtheria, and acellular pertussis (Td, Tdap) vaccine. An adult who has not previously received Tdap or who does not know his vaccine status should receive 1 dose of Tdap. This initial dose should be followed by tetanus and diphtheria toxoids (Td) booster doses every 10 years. Adults with an unknown or incomplete history of completing a 3-dose immunization series with Td-containing vaccines should begin or complete a primary immunization series including a Tdap dose. Adults should receive a Td booster every 10 years.  Varicella vaccine. An adult without evidence of immunity to varicella should receive 2 doses or a second dose if he has previously received 1 dose.  Human papillomavirus (HPV) vaccine. Males aged 68-21 years who have not received the vaccine previously should receive the 3-dose series. Males aged 22-26 years may be immunized. Immunization is recommended through the age of 6 years for any male who has sex with males and did not get any or all doses earlier. Immunization is recommended for any person with an immunocompromised condition through the age of 49 years if he did not get any or all doses earlier. During the 3-dose series, the second dose should be obtained 4-8 weeks after the first  dose. The third dose should be obtained 24 weeks after the first dose and 16 weeks after the second dose.  Zoster vaccine. One dose is recommended for adults aged 50 years or older unless certain conditions are present.  Measles, mumps, and rubella (MMR) vaccine. Adults born before [birth year] generally are considered immune to measles and mumps. Adults born in [birth year] or later should have 1 or more doses of MMR vaccine unless there is a contraindication to the vaccine or there is laboratory evidence of immunity to each of the three diseases. A routine second dose of MMR vaccine should be obtained at least 28 days after the first dose for students attending postsecondary  schools, health care workers, or international travelers. People who received inactivated measles vaccine or an unknown type of measles vaccine during 1963-1967 should receive 2 doses of MMR vaccine. People who received inactivated mumps vaccine or an unknown type of mumps vaccine before 1979 and are at high risk for mumps infection should consider immunization with 2 doses of MMR vaccine. Unvaccinated health care workers born before 1957 who lack laboratory evidence of measles, mumps, or rubella immunity or laboratory confirmation of disease should consider measles and mumps immunization with 2 doses of MMR vaccine or rubella immunization with 1 dose of MMR vaccine.  Pneumococcal 13-valent conjugate (PCV13) vaccine. When indicated, a person who is uncertain of his immunization history and has no record of immunization should receive the PCV13 vaccine. An adult aged 19 years or older who has certain medical conditions and has not been previously immunized should receive 1 dose of PCV13 vaccine. This PCV13 should be followed with a dose of pneumococcal polysaccharide (PPSV23) vaccine. The PPSV23 vaccine dose should be obtained at least 8 weeks after the dose of PCV13 vaccine. An adult aged 19 years or older who has certain medical conditions and  previously received 1 or more doses of PPSV23 vaccine should receive 1 dose of PCV13. The PCV13 vaccine dose should be obtained 1 or more years after the last PPSV23 vaccine dose.  Pneumococcal polysaccharide (PPSV23) vaccine. When PCV13 is also indicated, PCV13 should be obtained first. All adults aged 65 years and older should be immunized. An adult younger than age 65 years who has certain medical conditions should be immunized. Any person who resides in a nursing home or long-term care facility should be immunized. An adult smoker should be immunized. People with an immunocompromised condition and certain other conditions should receive both PCV13 and PPSV23 vaccines. People with human immunodeficiency virus (HIV) infection should be immunized as soon as possible after diagnosis. Immunization during chemotherapy or radiation therapy should be avoided. Routine use of PPSV23 vaccine is not recommended for American Indians, [place] Natives, or people younger than 65 years unless there are medical conditions that require PPSV23 vaccine. When indicated, people who have unknown immunization and have no record of immunization should receive PPSV23 vaccine. One-time revaccination 5 years after the first dose of PPSV23 is recommended for people aged 19-64 years who have chronic kidney failure, nephrotic syndrome, asplenia, or immunocompromised conditions. People who received 1-2 doses of PPSV23 before age 65 years should receive another dose of PPSV23 vaccine at age 65 years or later if at least 5 years have passed since the previous dose. Doses of PPSV23 are not needed for people immunized with PPSV23 at or after age 65 years.  Meningococcal vaccine. Adults with asplenia or persistent complement component deficiencies should receive 2 doses of quadrivalent meningococcal conjugate (MenACWY-D) vaccine. The doses should be obtained at least 2 months apart. Microbiologists working with certain meningococcal bacteria,  military recruits, people at risk during an outbreak, and people who travel to or live in countries with a high rate of meningitis should be immunized. A first-year college student up through age 21 years who is living in a residence hall should receive a dose if he did not receive a dose on or after his 16th birthday. Adults who have certain high-risk conditions should receive one or more doses of vaccine.  Hepatitis A vaccine. Adults who wish to be protected from this disease, have certain high-risk conditions, work with hepatitis A-infected animals, work in hepatitis A research labs, or   travel to or work in countries with a high rate of hepatitis A should be immunized. Adults who were previously unvaccinated and who anticipate close contact with an international adoptee during the first 60 days after arrival in the [place] from a country with a high rate of hepatitis A should be immunized.  Hepatitis B vaccine. Adults should be immunized if they wish to be protected from this disease, have certain high-risk conditions, may be exposed to blood or other infectious body fluids, are household contacts or sex partners of hepatitis B positive people, are clients or workers in certain care facilities, or travel to or work in countries with a high rate of hepatitis B.  Haemophilus influenzae type b (Hib) vaccine. A previously unvaccinated person with asplenia or sickle cell disease or having a scheduled splenectomy should receive 1 dose of Hib vaccine. Regardless of previous immunization, a recipient of a hematopoietic stem cell transplant should receive a 3-dose series 6-12 months after his successful transplant. Hib vaccine is not recommended for adults with HIV infection. Preventive Service / Frequency Ages 52 to 17  Blood pressure check.** / Every 1 to 2 years.  Lipid and cholesterol check.** / Every 5 years beginning at age 69.  Hepatitis C blood test.** / For any individual with known risks for  hepatitis C.  Skin self-exam. / Monthly.  Influenza vaccine. / Every year.  Tetanus, diphtheria, and acellular pertussis (Tdap, Td) vaccine.** / Consult your health care provider. 1 dose of Td every 10 years.  Varicella vaccine.** / Consult your health care provider.  HPV vaccine. / 3 doses over 6 months, if 72 or younger.  Measles, mumps, rubella (MMR) vaccine.** / You need at least 1 dose of MMR if you were born in 1957 or later. You may also need a second dose.  Pneumococcal 13-valent conjugate (PCV13) vaccine.** / Consult your health care provider.  Pneumococcal polysaccharide (PPSV23) vaccine.** / 1 to 2 doses if you smoke cigarettes or if you have certain conditions.  Meningococcal vaccine.** / 1 dose if you are age 35 to 60 years and a Market researcher living in a residence hall, or have one of several medical conditions. You may also need additional booster doses.  Hepatitis A vaccine.** / Consult your health care provider.  Hepatitis B vaccine.** / Consult your health care provider.  Haemophilus influenzae type b (Hib) vaccine.** / Consult your health care provider. Ages 35 to 8  Blood pressure check.** / Every 1 to 2 years.  Lipid and cholesterol check.** / Every 5 years beginning at age 57.  Lung cancer screening. / Every year if you are aged 44-80 years and have a 30-pack-year history of smoking and currently smoke or have quit within the past 15 years. Yearly screening is stopped once you have quit smoking for at least 15 years or develop a health problem that would prevent you from having lung cancer treatment.  Fecal occult blood test (FOBT) of stool. / Every year beginning at age 55 and continuing until age 73. You may not have to do this test if you get a colonoscopy every 10 years.  Flexible sigmoidoscopy** or colonoscopy.** / Every 5 years for a flexible sigmoidoscopy or every 10 years for a colonoscopy beginning at age 28 and continuing until age  1.  Hepatitis C blood test.** / For all people born from [birth year] through 1965 and any individual with known risks for hepatitis C.  Skin self-exam. / Monthly.  Influenza vaccine. / Every  year.  Tetanus, diphtheria, and acellular pertussis (Tdap/Td) vaccine.** / Consult your health care provider. 1 dose of Td every 10 years.  Varicella vaccine.** / Consult your health care provider.  Zoster vaccine.** / 1 dose for adults aged 60 years or older.  Measles, mumps, rubella (MMR) vaccine.** / You need at least 1 dose of MMR if you were born in 1957 or later. You may also need a second dose.  Pneumococcal 13-valent conjugate (PCV13) vaccine.** / Consult your health care provider.  Pneumococcal polysaccharide (PPSV23) vaccine.** / 1 to 2 doses if you smoke cigarettes or if you have certain conditions.  Meningococcal vaccine.** / Consult your health care provider.  Hepatitis A vaccine.** / Consult your health care provider.  Hepatitis B vaccine.** / Consult your health care provider.  Haemophilus influenzae type b (Hib) vaccine.** / Consult your health care provider. Ages 65 and over  Blood pressure check.** / Every 1 to 2 years.  Lipid and cholesterol check.**/ Every 5 years beginning at age 20.  Lung cancer screening. / Every year if you are aged 55-80 years and have a 30-pack-year history of smoking and currently smoke or have quit within the past 15 years. Yearly screening is stopped once you have quit smoking for at least 15 years or develop a health problem that would prevent you from having lung cancer treatment.  Fecal occult blood test (FOBT) of stool. / Every year beginning at age 50 and continuing until age 75. You may not have to do this test if you get a colonoscopy every 10 years.  Flexible sigmoidoscopy** or colonoscopy.** / Every 5 years for a flexible sigmoidoscopy or every 10 years for a colonoscopy beginning at age 50 and continuing until age 75.  Hepatitis C blood  test.** / For all people born from 1945 through 1965 and any individual with known risks for hepatitis C.  Abdominal aortic aneurysm (AAA) screening./ Screening current or former smokers or have Hypertension.  Skin self-exam. / Monthly.  Influenza vaccine. / Every year.  Tetanus, diphtheria, and acellular pertussis (Tdap/Td) vaccine.** / 1 dose of Td every 10 years.  Varicella vaccine.** / Consult your health care provider.  Zoster vaccine.** / 1 dose for adults aged 60 years or older.  Pneumococcal 13-valent conjugate (PCV13) vaccine.** / Consult your health care provider.  Pneumococcal polysaccharide (PPSV23) vaccine.** / 1 dose for all adults aged 65 years and older.  Meningococcal vaccine.** / Consult your health care provider.  Hepatitis A vaccine.** / Consult your health care provider.  Hepatitis B vaccine.** / Consult your health care provider.  Haemophilus influenzae type b (Hib) vaccine.** / Consult your health care provider.  Health Maintenance A healthy lifestyle and preventative care can promote health and wellness. Maintain regular health, dental, and eye exams. Eat a healthy diet. Foods like vegetables, fruits, whole grains, low-fat dairy products, and lean protein foods contain the nutrients you need and are low in calories. Decrease your intake of foods high in solid fats, added sugars, and salt. Get information about a proper diet from your health care provider, if necessary. Regular physical exercise is one of the most important things you can do for your health. Most adults should get at least 150 minutes of moderate-intensity exercise (any activity that increases your heart rate and causes you to sweat) each week. In addition, most adults need muscle-strengthening exercises on 2 or more days a week.  Maintain a healthy weight. The body mass index (BMI) is a screening tool to identify   possible weight problems. It provides an estimate of body fat based on height and  weight. Your health care provider can find your BMI and can help you achieve or maintain a healthy weight. For males 20 years and older: A BMI below 18.5 is considered underweight. A BMI of 18.5 to 24.9 is normal. A BMI of 25 to 29.9 is considered overweight. A BMI of 30 and above is considered obese. Maintain normal blood lipids and cholesterol by exercising and minimizing your intake of saturated fat. Eat a balanced diet with plenty of fruits and vegetables. Blood tests for lipids and cholesterol should begin at age 20 and be repeated every 5 years. If your lipid or cholesterol levels are high, you are over age 50, or you are at high risk for heart disease, you may need your cholesterol levels checked more frequently.Ongoing high lipid and cholesterol levels should be treated with medicines if diet and exercise are not working. If you smoke, find out from your health care provider how to quit. If you do not use tobacco, do not start. Lung cancer screening is recommended for adults aged 55-80 years who are at high risk for developing lung cancer because of a history of smoking. A yearly low-dose CT scan of the lungs is recommended for people who have at least a 30-pack-year history of smoking and are current smokers or have quit within the past 15 years. A pack year of smoking is smoking an average of 1 pack of cigarettes a day for 1 year (for example, a 30-pack-year history of smoking could mean smoking 1 pack a day for 30 years or 2 packs a day for 15 years). Yearly screening should continue until the smoker has stopped smoking for at least 15 years. Yearly screening should be stopped for people who develop a health problem that would prevent them from having lung cancer treatment. If you choose to drink alcohol, do not have more than 2 drinks per day. One drink is considered to be 12 oz (360 mL) of beer, 5 oz (150 mL) of wine, or 1.5 oz (45 mL) of liquor. Avoid the use of street drugs. Do not share  needles with anyone. Ask for help if you need support or instructions about stopping the use of drugs. High blood pressure causes heart disease and increases the risk of stroke. Blood pressure should be checked at least every 1-2 years. Ongoing high blood pressure should be treated with medicines if weight loss and exercise are not effective. If you are 45-79 years old, ask your health care provider if you should take aspirin to prevent heart disease. Diabetes screening involves taking a blood sample to check your fasting blood sugar level. This should be done once every 3 years after age 45 if you are at a normal weight and without risk factors for diabetes. Testing should be considered at a younger age or be carried out more frequently if you are overweight and have at least 1 risk factor for diabetes. Colorectal cancer can be detected and often prevented. Most routine colorectal cancer screening begins at the age of 50 and continues through age 75. However, your health care provider may recommend screening at an earlier age if you have risk factors for colon cancer. On a yearly basis, your health care provider may provide home test kits to check for hidden blood in the stool. A small camera at the end of a tube may be used to directly examine the colon (sigmoidoscopy or colonoscopy)   to detect the earliest forms of colorectal cancer. Talk to your health care provider about this at age 50 when routine screening begins. A direct exam of the colon should be repeated every 5-10 years through age 75, unless early forms of precancerous polyps or small growths are found. People who are at an increased risk for hepatitis B should be screened for this virus. You are considered at high risk for hepatitis B if: You were born in a country where hepatitis B occurs often. Talk with your health care provider about which countries are considered high risk. Your parents were born in a high-risk country and you have not  received a shot to protect against hepatitis B (hepatitis B vaccine). You have HIV or AIDS. You use needles to inject street drugs. You live with, or have sex with, someone who has hepatitis B. You are a man who has sex with other men (MSM). You get hemodialysis treatment. You take certain medicines for conditions like cancer, organ transplantation, and autoimmune conditions. Hepatitis C blood testing is recommended for all people born from 1945 through 1965 and any individual with known risk factors for hepatitis C. Healthy men should no longer receive prostate-specific antigen (PSA) blood tests as part of routine cancer screening. Talk to your health care provider about prostate cancer screening. Testicular cancer screening is not recommended for adolescents or adult males who have no symptoms. Screening includes self-exam, a health care provider exam, and other screening tests. Consult with your health care provider about any symptoms you have or any concerns you have about testicular cancer. Practice safe sex. Use condoms and avoid high-risk sexual practices to reduce the spread of sexually transmitted infections (STIs). You should be screened for STIs, including gonorrhea and chlamydia if: You are sexually active and are younger than 24 years. You are older than 24 years, and your health care provider tells you that you are at risk for this type of infection. Your sexual activity has changed since you were last screened, and you are at an increased risk for chlamydia or gonorrhea. Ask your health care provider if you are at risk. If you are at risk of being infected with HIV, it is recommended that you take a prescription medicine daily to prevent HIV infection. This is called pre-exposure prophylaxis (PrEP). You are considered at risk if: You are a man who has sex with other men (MSM). You are a heterosexual man who is sexually active with multiple partners. You take drugs by injection. You  are sexually active with a partner who has HIV. Talk with your health care provider about whether you are at high risk of being infected with HIV. If you choose to begin PrEP, you should first be tested for HIV. You should then be tested every 3 months for as long as you are taking PrEP. Use sunscreen. Apply sunscreen liberally and repeatedly throughout the day. You should seek shade when your shadow is shorter than you. Protect yourself by wearing long sleeves, pants, a wide-brimmed hat, and sunglasses year round whenever you are outdoors. Tell your health care provider of new moles or changes in moles, especially if there is a change in shape or color. Also, tell your health care provider if a mole is larger than the size of a pencil eraser. Stay current with your vaccines (immunizations).   

## 2017-03-19 NOTE — Progress Notes (Signed)
[facility]   Unk Pinto, M.D.    Uvaldo Bristle. Silverio Lay, P.A.-C      Starlyn Skeans, P.A.-C  [facility]                [address] [place] [address] Telephone [phone] Telefax [phone] Annual  Screening/Preventative Visit  & Comprehensive Evaluation & Examination     This very nice 61 y.o. MWM presents for a Screening/Preventative Visit & comprehensive evaluation and management of multiple medical co-morbidities.  Patient has been followed for HTN, T2_NIDDM  , Hyperlipidemia and Vitamin D Deficiency.     HTN predates since 2008. Patient's BP has been controlled at home.   In 2008 , he had a negative Stress Myoview. Today's BP is at goal - 128/84. Patient denies any cardiac symptoms as chest pain, palpitations, shortness of breath, dizziness or ankle swelling.     Patient's hyperlipidemia is controlled with diet and medications. Patient denies myalgias or other medication SE's. Last lipids were at goal albeit elevated Trig's: Lab Results  Component Value Date   CHOL 156 12/13/2016   HDL 40 (L) 12/13/2016   LDLCALC 72 12/13/2016   TRIG 222 (H) 12/13/2016   CHOLHDL 3.9 12/13/2016      Patient has Morbid Obesity (BMI 35+) and consequent prediabetes with A1c 6.0% in 2009 and then A1c 6.7% in 11/2015 meeting criteria for T2_NIDDM which he is attempting to control with diet and meds (Metformin).  He denies reactive hypoglycemic symptoms, visual blurring, diabetic polys or paresthesias.  Altho he had had some success with dieting and weight loss on Phentermine , his last A1c was still not at goal: Lab Results  Component Value Date   HGBA1C 6.4 (H) 12/13/2016      Finally, patient has history of Vitamin D Deficiency ("35" in 2008)  and last vitamin D was at goal: Lab Results  Component Value Date   VD25OH 78 12/13/2016   Current Outpatient Prescriptions on File Prior to Visit   Medication Sig  . bisoprolol-hctz 5-6.25 MG Take 1 tablet by mouth daily.  [name] VITAMIN D 5000 UNITS  Take 10,000 Units by mouth daily.   [name] CINNAMON  Take 1,000 mg by mouth daily.  . metFORMIN-XR 500 MG 24 hr tablet Take 4 tablets daily  . Multi-Vit w/MINERALS Take 1 tablet by mouth daily.  [name] nystatin cream (MYCOSTATIN) Apply 1 application topically 2 (two) times daily.  [name] OVER THE COUNTER MEDICATION Take 1 tablet by mouth daily. Magnesium with zinc  . OVER THE COUNTER MEDICATION Take 1 tablet by mouth daily. Balance B50  . phentermine  37.5 MG tablet TAKE 1/2-1 TAB EVERY MORNING   . vitamin C  500 MG tablet Take 1,000 mg by mouth daily.   Allergies  Allergen Reactions  . Ppd [Tuberculin Purified Protein Derivative]     Pt states he is not allergic to this.Marland Kitchenpositive tb test 2014, pt took tb treatment 2014   Past Medical History:  Diagnosis Date  . Elevated hemoglobin A1c   . Heart murmur    at birth  . History of kidney stones 2011  . Hypertension   . Morbid obesity (BMI 36. 06/07/2015  . 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 PNEUMOCOCCAL POLYSACCHARIDE VACCINE (1) 12/16/1956  .  FOOT EXAM  12/16/1964  . OPHTHALMOLOGY EXAM  12/16/1964  . COLONOSCOPY  12/16/2004  . URINE MICROALBUMIN  02/27/2017  . HEMOGLOBIN A1C  06/13/2017  . INFLUENZA VACCINE  07/16/2017  . TETANUS/TDAP  02/13/2025  . Hepatitis C Screening  Completed  . HIV Screening  Completed   Past Surgical History:  Procedure Laterality Date  . CHOLECYSTECTOMY  07/27/2012   Procedure: LAPAROSCOPIC CHOLECYSTECTOMY WITH INTRAOPERATIVE CHOLANGIOGRAM;  Surgeon: Marcello Moores A. [facility], MD;  Location: [facility];  Service: General;  Laterality: N/A;  . CYSTOSCOPY/RETROGRADE/URETEROSCOPY/STONE EXTRACTION WITH BASKET  5/11  . HERNIA REPAIR     as a baby  . KNEE SURGERY Right 11/25/11 and 2015   arthroscopic, cortisone injections done also  .  TOTAL KNEE ARTHROPLASTY Right 06/06/2015   Procedure: RIGHT TOTAL KNEE ARTHROPLASTY;  Surgeon: Paralee Cancel, MD;  Location: [facility];  Service: Orthopedics;  Laterality: Right;  . WISDOM TOOTH EXTRACTION  yrs ago   Family History  Problem Relation Age of Onset  . Hypertension Mother   . Diabetes Mother   . Kidney failure Mother   . Diabetes Father   . Diabetes Sister   . Diabetes Brother    Social History   Social History  . Marital status: Married    Spouse name: N/A  . Number of children: N/A  . Years of education: N/A   Occupational History  .    Social History Main Topics  . Smoking status: Former Smoker    Packs/day: 1.00    Years: 30.00    Types: Cigarettes    Quit date: 12/16/2013  . Smokeless tobacco: Former Systems developer    Quit date: 08/16/2014  . Alcohol use 0.0 oz/week     Comment: occasional  . Drug use: No  . Sexual activity: Not on file     ROS Constitutional: Denies fever, chills, weight loss/gain, headaches, insomnia,  night sweats or change in appetite. Does c/o fatigue. Eyes: Denies redness, blurred vision, diplopia, discharge, itchy or watery eyes.  ENT: Denies discharge, congestion, post nasal drip, epistaxis, sore throat, earache, hearing loss, dental pain, Tinnitus, Vertigo, Sinus pain or snoring.  Cardio: Denies chest pain, palpitations, irregular heartbeat, syncope, dyspnea, diaphoresis, orthopnea, PND, claudication or edema Respiratory: denies cough, dyspnea, DOE, pleurisy, hoarseness, laryngitis or wheezing.  Gastrointestinal: Denies dysphagia, heartburn, reflux, water brash, pain, cramps, nausea, vomiting, bloating, diarrhea, constipation, hematemesis, melena, hematochezia, jaundice or hemorrhoids Genitourinary: Denies dysuria, frequency, urgency, nocturia, hesitancy, discharge, hematuria or flank pain Musculoskeletal: Denies arthralgia, myalgia, stiffness, Jt. Swelling, pain, limp or strain/sprain. Denies Falls. Skin: Denies puritis, rash, hives, warts,  acne, eczema or change in skin lesion Neuro: No weakness, tremor, incoordination, spasms, paresthesia or pain Psychiatric: Denies confusion, memory loss or sensory loss. Denies Depression. Endocrine: Denies change in weight, skin, hair change, nocturia, and paresthesia, diabetic polys, visual blurring or hyper / hypo glycemic episodes.  Heme/Lymph: No excessive bleeding, bruising or enlarged lymph nodes.  Physical Exam  BP 128/84   P 68   T 97.5 F   R 16   Ht 5\' 10"   Wt 244 lb 12.8 oz   BMI 35.13   General Appearance: Over nourished and well groomed and in no apparent distress.  Eyes: PERRLA, EOMs, conjunctiva no swelling or erythema, normal fundi and vessels. Sinuses: No frontal/maxillary tenderness ENT/Mouth: EACs patent / TMs  nl. Nares clear without erythema, swelling, mucoid exudates. Oral hygiene is good. No erythema, swelling, or exudate. Tongue normal, non-obstructing. Tonsils not swollen or erythematous. Hearing normal.  Neck: Supple, thyroid normal. No bruits, nodes or JVD. Respiratory: Respiratory effort normal.  BS equal and clear bilateral without rales, rhonci, wheezing or stridor. Cardio: Heart sounds are normal with regular rate and rhythm and no murmurs, rubs or gallops. Peripheral pulses are normal and equal bilaterally without edema. No aortic or femoral bruits. Chest: symmetric with normal excursions and percussion.  Abdomen: Soft, with Nl bowel sounds. Nontender, no guarding, rebound, hernias, masses, or organomegaly.  Lymphatics: Non tender without lymphadenopathy.  Genitourinary: No hernias.Testes nl. DRE - prostate nl for age - smooth & firm w/o nodules. Musculoskeletal: Full ROM all peripheral extremities, joint stability, 5/5 strength, and normal gait. Skin: Warm and dry without rashes, lesions, cyanosis, clubbing or  ecchymosis.  Neuro: Cranial nerves intact, reflexes equal bilaterally. Normal muscle tone, no cerebellar symptoms. Sensation intact to touch,  vibratory and Monofilament testing bilaterally to the toes. Pysch: Alert and oriented X 3 with normal affect, insight and judgment appropriate.   Assessment and Plan  1. Annual Preventative/Screening Exam    2. Essential hypertension  - EKG 12-Lead - [place], RETROPERITNL ABD,  LTD - Urinalysis, Routine w reflex microscopic - Microalbumin / creatinine urine ratio - CBC with Differential/Platelet - BASIC METABOLIC PANEL WITH GFR - Magnesium - TSH  3. Mixed hyperlipidemia  - EKG 12-Lead - [place], RETROPERITNL ABD,  LTD - Hepatic function panel - Lipid panel - TSH  4. Diabetes mellitus without complication ([place])  - EKG 12-Lead - [place], RETROPERITNL ABD,  LTD - Microalbumin / creatinine urine ratio - HM DIABETES FOOT EXAM - LOW EXTREMITY NEUR EXAM DOCUM - Hemoglobin A1c - Insulin, random  5. Vitamin D deficiency  - VITAMIN D 25 Hydroxy   6. Screening for rectal cancer  - POC Hemoccult Bld/Stl   7. Prostate cancer screening  - PSA  8. Screening for ischemic heart disease  - EKG 12-Lead  9. Screening for AAA (aortic abdominal aneurysm)  - [place], RETROPERITNL ABD,  LTD  10. Fatigue, unspecified type  - Vitamin B12 - Iron and TIBC - Testosterone - CBC with Differential/Platelet  11. Medication management  - Urinalysis, Routine w reflex microscopic - CBC with Differential/Platelet - BASIC METABOLIC PANEL WITH GFR - Hepatic function panel - Magnesium - Lipid panel - TSH - Hemoglobin A1c - Insulin, random - VITAMIN D 25 Hydroxy        Patient was counseled in prudent diet, weight contro to achieve/maintain BMI less than 25, BP monitoring, regular exercise and medications as discussed.  Discussed med effects and SE's. Routine screening labs and tests as requested with regular follow-up as recommended. Over 40 minutes of exam, counseling, chart review and high complex critical decision making was performed

## 2017-03-20 LAB — URINALYSIS, MICROSCOPIC ONLY
Bacteria, UA: NONE SEEN [HPF]
CASTS: NONE SEEN [LPF]
CRYSTALS: NONE SEEN [HPF]
SQUAMOUS EPITHELIAL / LPF: NONE SEEN [HPF] (ref <=5)
YEAST: NONE SEEN [HPF]

## 2017-03-20 LAB — CBC WITH DIFFERENTIAL/PLATELET
BASOS ABS: 86 {cells}/uL (ref 0–200)
BASOS PCT: 1 %
EOS ABS: 1204 {cells}/uL — AB (ref 15–500)
EOS PCT: 14 %
HCT: 47.6 % (ref 38.5–50.0)
Hemoglobin: 15.8 g/dL (ref 13.2–17.1)
Lymphocytes Relative: 26 %
Lymphs Abs: 2236 cells/uL (ref 850–3900)
MCH: 30.2 pg (ref 27.0–33.0)
MCHC: 33.2 g/dL (ref 32.0–36.0)
MCV: 91 fL (ref 80.0–100.0)
MPV: 10 fL (ref 7.5–12.5)
Monocytes Absolute: 946 cells/uL (ref 200–950)
Monocytes Relative: 11 %
NEUTROS ABS: 4128 {cells}/uL (ref 1500–7800)
Neutrophils Relative %: 48 %
PLATELETS: 262 10*3/uL (ref 140–400)
RBC: 5.23 MIL/uL (ref 4.20–5.80)
RDW: 13.8 % (ref 11.0–15.0)
WBC: 8.6 10*3/uL (ref 3.8–10.8)

## 2017-03-20 LAB — LIPID PANEL
CHOL/HDL RATIO: 3.3 ratio (ref <5.0)
Cholesterol: 127 mg/dL (ref <200)
HDL: 39 mg/dL — AB (ref >40)
LDL CALC: 70 mg/dL (ref <100)
Triglycerides: 92 mg/dL (ref <150)
VLDL: 18 mg/dL (ref <30)

## 2017-03-20 LAB — IRON AND TIBC
%SAT: 14 % — ABNORMAL LOW (ref 15–60)
IRON: 55 ug/dL (ref 50–180)
TIBC: 381 ug/dL (ref 250–425)
UIBC: 326 ug/dL (ref 125–400)

## 2017-03-20 LAB — HEPATIC FUNCTION PANEL
ALK PHOS: 77 U/L (ref 40–115)
ALT: 22 U/L (ref 9–46)
AST: 21 U/L (ref 10–35)
Albumin: 4.3 g/dL (ref 3.6–5.1)
BILIRUBIN DIRECT: 0.1 mg/dL (ref <=0.2)
BILIRUBIN INDIRECT: 0.3 mg/dL (ref 0.2–1.2)
BILIRUBIN TOTAL: 0.4 mg/dL (ref 0.2–1.2)
Total Protein: 7.2 g/dL (ref 6.1–8.1)

## 2017-03-20 LAB — MICROALBUMIN / CREATININE URINE RATIO
CREATININE, URINE: 133 mg/dL (ref 20–370)
MICROALB UR: 2.1 mg/dL
MICROALB/CREAT RATIO: 16 ug/mg{creat} (ref <30)

## 2017-03-20 LAB — HEMOGLOBIN A1C
Hgb A1c MFr Bld: 6.2 % — ABNORMAL HIGH (ref <5.7)
MEAN PLASMA GLUCOSE: 131 mg/dL

## 2017-03-20 LAB — BASIC METABOLIC PANEL WITH GFR
BUN: 17 mg/dL (ref 7–25)
CHLORIDE: 102 mmol/L (ref 98–110)
CO2: 28 mmol/L (ref 20–31)
CREATININE: 0.86 mg/dL (ref 0.70–1.25)
Calcium: 10.4 mg/dL — ABNORMAL HIGH (ref 8.6–10.3)
GFR, Est African American: >89 mL/min (ref >=60)
GFR, Est Non African American: >89 mL/min (ref >=60)
Glucose, Bld: 93 mg/dL (ref 65–99)
POTASSIUM: 5 mmol/L (ref 3.5–5.3)
SODIUM: 142 mmol/L (ref 135–146)

## 2017-03-20 LAB — PSA: PSA: 0.5 ng/mL (ref <=4.0)

## 2017-03-20 LAB — URINALYSIS, ROUTINE W REFLEX MICROSCOPIC
Bilirubin Urine: NEGATIVE
Glucose, UA: NEGATIVE
Hgb urine dipstick: NEGATIVE
Ketones, ur: NEGATIVE
NITRITE: NEGATIVE
PH: 6 (ref 5.0–8.0)
Protein, ur: NEGATIVE
SPECIFIC GRAVITY, URINE: 1.02 (ref 1.001–1.035)

## 2017-03-20 LAB — TESTOSTERONE: Testosterone: 404 ng/dL (ref 250–827)

## 2017-03-20 LAB — TSH: TSH: 2.37 m[IU]/L (ref 0.40–4.50)

## 2017-03-20 LAB — MAGNESIUM: MAGNESIUM: 2 mg/dL (ref 1.5–2.5)

## 2017-03-20 LAB — VITAMIN D 25 HYDROXY (VIT D DEFICIENCY, FRACTURES): Vit D, 25-Hydroxy: 100 ng/mL (ref 30–100)

## 2017-03-20 LAB — VITAMIN B12: Vitamin B-12: 650 pg/mL (ref 200–1100)

## 2017-03-20 LAB — INSULIN, RANDOM: INSULIN: 7.2 u[IU]/mL (ref 2.0–19.6)

## 2017-04-07 ENCOUNTER — Other Ambulatory Visit: Payer: Self-pay | Admitting: Internal Medicine

## 2017-04-09 ENCOUNTER — Other Ambulatory Visit: Payer: Self-pay

## 2017-04-09 DIAGNOSIS — Z1212 Encounter for screening for malignant neoplasm of rectum: Secondary | ICD-10-CM

## 2017-04-09 LAB — POC HEMOCCULT BLD/STL (HOME/3-CARD/SCREEN)
Card #2 Fecal Occult Blod, POC: NEGATIVE
Card #3 Fecal Occult Blood, POC: NEGATIVE
Fecal Occult Blood, POC: NEGATIVE

## 2017-04-20 ENCOUNTER — Encounter: Payer: Self-pay | Admitting: Internal Medicine

## 2017-07-09 NOTE — Progress Notes (Signed)
Assessment and Plan:  Hypertension:  - continue medications, DASH diet, exercise and monitor at home. Call if greater than 130/80.   Cholesterol -continue medications, check lipids, decrease fatty foods, increase activity.   Diabetes without complications  -Continue diet and exercise.  -Check A1C  Vitamin D Def -continue medications.   Morbid Obesity with co morbidities - long discussion about weight loss, diet, and exercise  Smoking cessation -  instruction/counseling given, counseled patient on the dangers of tobacco use, advised patient to stop smoking, and reviewed strategies to maximize success, patient not ready to quit at this time.   Continue diet and meds as  Future Appointments Date Time Provider [place]  10/10/2017 9:30 AM Unk Pinto, MD [facility] None  04/06/2018 9:00 AM Unk Pinto, MD [facility] None     HPI 61 y.o. male  presents for 3 month follow up with hypertension, hyperlipidemia, diabetes and vitamin D deficiency.   His blood pressure has been controlled at home, today their BP is BP: 128/80.He does not workout but is active at work and he is going to walk 5 K in August. He denies chest pain, shortness of breath, dizziness.   He is on cholesterol medication and denies myalgias. His cholesterol is at goal. The cholesterol was:  03/19/2017: Cholesterol 127; HDL 39; LDL Cholesterol 70; Triglycerides 92   He has not been working on diet and exercise for diabetes without complications, he is on bASA, he is on ACE/ARB, he is on metformin 2 a day and denies  foot ulcerations, hyperglycemia, hypoglycemia , increased appetite, nausea, paresthesia of the feet, polydipsia, polyuria, visual disturbances, vomiting and weight loss. Last A1C was: 03/19/2017: Hgb A1c MFr Bld 6.2 .    Patient is on Vitamin D supplement. 03/19/2017: Vit D, 25-Hydroxy 100  BMI is Body mass index is 35.3 kg/m., he is working on diet and exercise. He has been off x 2 months, he  is on 1st shift at work [employer] from Last 3 Encounters:  07/11/17 246 lb (111.6 kg)  03/19/17 244 lb 12.8 oz (111 kg)  12/13/16 258 lb 9.6 oz (117.3 kg)    Current Medications:  Current Outpatient Prescriptions on File Prior to Visit  Medication Sig Dispense Refill  . bisoprolol-hydrochlorothiazide (ZIAC) 5-6.25 MG tablet TAKE ONE TABLET BY MOUTH EVERY MORNING FOR BLOOD PRESSURE 90 tablet 1  . Cholecalciferol (VITAMIN D3 MAXIMUM STRENGTH) 5000 UNITS capsule Take 10,000 Units by mouth daily.     [name] CINNAMON PO Take 1,000 mg by mouth daily.    . Multiple Vitamin (MULTIVITAMIN WITH MINERALS) TABS Take 1 tablet by mouth daily.    [name] OVER THE COUNTER MEDICATION Take 1 tablet by mouth daily. Magnesium with zinc    . OVER THE COUNTER MEDICATION Take 1 tablet by mouth daily. Balance B50    . phentermine (ADIPEX-P) 37.5 MG tablet TAKE 1/2-1 TABLET BY MOUTH EVERY MORNING FOR DIETING AND WEIGHT LOSS 30 tablet 3  . vitamin C (ASCORBIC ACID) 500 MG tablet Take 1,000 mg by mouth daily.    . metFORMIN (GLUCOPHAGE XR) 500 MG 24 hr tablet Take 4 tablets daily - 1 tablet with Breakfast and Lunch & 2 tablets with Supper for Diabetes 360 tablet 1   No current facility-administered medications on file prior to visit.    Medical History:  Past Medical History:  Diagnosis Date  . Elevated hemoglobin A1c   . Heart murmur    at birth  . History of kidney stones 2011  .  Hypertension   . Morbid obesity (BMI 36. 06/07/2015  . 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  HENT: Negative for congestion, ear pain and sore throat.   Respiratory: Negative for cough, shortness  of breath and wheezing.   Cardiovascular: Negative for chest pain, palpitations and leg swelling.  Gastrointestinal: Negative for abdominal pain, blood in stool, constipation, diarrhea, heartburn and melena.  Genitourinary: Negative.   Skin: Negative.   Neurological: Negative for dizziness, sensory change, loss of consciousness and headaches.  Psychiatric/Behavioral: Negative for depression. The patient is not nervous/anxious and does not have insomnia.     Family history- Review and unchanged  Social history- Review and unchanged  Physical Exam: BP 128/80   Pulse 67   Temp 97.7 F (36.5 C)   Resp 14   Ht 5\' 10"  (1.778 m)   Wt 246 lb (111.6 kg)   SpO2 96%   BMI 35.30 kg/m  Wt Readings from Last 3 Encounters:  07/11/17 246 lb (111.6 kg)  03/19/17 244 lb 12.8 oz (111 kg)  12/13/16 258 lb 9.6 oz (117.3 kg)   General Appearance: Well nourished well developed, non-toxic appearing, in no apparent distress. Eyes: PERRLA, EOMs, conjunctiva no swelling or erythema ENT/Mouth: Ear canals clear with no erythema, swelling, or discharge.  TMs normal bilaterally, oropharynx clear, moist, with no exudate.   Neck: Supple, thyroid normal, no JVD, no cervical adenopathy.  Respiratory: Respiratory effort normal, breath sounds clear A&P, no wheeze, rhonchi or rales noted.  No retractions, no accessory muscle usage Cardio: RRR with no MRGs. No noted edema.  Abdomen: Soft, + BS.  Non tender, no guarding, rebound, hernias, masses. Musculoskeletal: Full ROM, 5/5 strength, Normal gait Skin: Warm, dry without rashes, lesions, ecchymosis.  Neuro: Awake and oriented X 3, Cranial nerves intact. No cerebellar symptoms.  Psych: normal affect, Insight and Judgment appropriate.    Vicie Mutters, PA-C 9:12 AM [facility]

## 2017-07-11 ENCOUNTER — Ambulatory Visit (INDEPENDENT_AMBULATORY_CARE_PROVIDER_SITE_OTHER): Payer: BLUE CROSS/BLUE SHIELD | Admitting: Physician Assistant

## 2017-07-11 ENCOUNTER — Encounter: Payer: Self-pay | Admitting: Physician Assistant

## 2017-07-11 VITALS — BP 128/80 | HR 67 | Temp 97.7°F | Resp 14 | Ht 70.0 in | Wt 246.0 lb

## 2017-07-11 DIAGNOSIS — E559 Vitamin D deficiency, unspecified: Secondary | ICD-10-CM

## 2017-07-11 DIAGNOSIS — R7309 Other abnormal glucose: Secondary | ICD-10-CM | POA: Diagnosis not present

## 2017-07-11 DIAGNOSIS — Z79899 Other long term (current) drug therapy: Secondary | ICD-10-CM | POA: Diagnosis not present

## 2017-07-11 DIAGNOSIS — E782 Mixed hyperlipidemia: Secondary | ICD-10-CM

## 2017-07-11 DIAGNOSIS — I10 Essential (primary) hypertension: Secondary | ICD-10-CM

## 2017-07-11 LAB — CBC WITH DIFFERENTIAL/PLATELET
Basophils Absolute: 0 cells/uL (ref 0–200)
Basophils Relative: 0 %
EOS PCT: 2 %
Eosinophils Absolute: 162 cells/uL (ref 15–500)
HCT: 49.2 % (ref 38.5–50.0)
HEMOGLOBIN: 16.6 g/dL (ref 13.2–17.1)
LYMPHS ABS: 2592 {cells}/uL (ref 850–3900)
Lymphocytes Relative: 32 %
MCH: 30.4 pg (ref 27.0–33.0)
MCHC: 33.7 g/dL (ref 32.0–36.0)
MCV: 90.1 fL (ref 80.0–100.0)
MPV: 10.6 fL (ref 7.5–12.5)
Monocytes Absolute: 810 cells/uL (ref 200–950)
Monocytes Relative: 10 %
NEUTROS PCT: 56 %
Neutro Abs: 4536 cells/uL (ref 1500–7800)
Platelets: 226 10*3/uL (ref 140–400)
RBC: 5.46 MIL/uL (ref 4.20–5.80)
RDW: 14.2 % (ref 11.0–15.0)
WBC: 8.1 10*3/uL (ref 3.8–10.8)

## 2017-07-11 LAB — BASIC METABOLIC PANEL WITH GFR
BUN: 19 mg/dL (ref 7–25)
CO2: 25 mmol/L (ref 20–31)
Calcium: 9.6 mg/dL (ref 8.6–10.3)
Chloride: 104 mmol/L (ref 98–110)
Creat: 0.82 mg/dL (ref 0.70–1.25)
Glucose, Bld: 135 mg/dL — ABNORMAL HIGH (ref 65–99)
POTASSIUM: 4.3 mmol/L (ref 3.5–5.3)
Sodium: 140 mmol/L (ref 135–146)

## 2017-07-11 LAB — TSH: TSH: 1.86 m[IU]/L (ref 0.40–4.50)

## 2017-07-11 LAB — HEPATIC FUNCTION PANEL
ALK PHOS: 70 U/L (ref 40–115)
ALT: 28 U/L (ref 9–46)
AST: 19 U/L (ref 10–35)
Albumin: 4.4 g/dL (ref 3.6–5.1)
BILIRUBIN DIRECT: 0.1 mg/dL (ref <=0.2)
Indirect Bilirubin: 0.4 mg/dL (ref 0.2–1.2)
Total Bilirubin: 0.5 mg/dL (ref 0.2–1.2)
Total Protein: 6.9 g/dL (ref 6.1–8.1)

## 2017-07-11 LAB — LIPID PANEL
Cholesterol: 145 mg/dL (ref <200)
HDL: 36 mg/dL — AB (ref >40)
LDL Cholesterol: 82 mg/dL (ref <100)
TRIGLYCERIDES: 133 mg/dL (ref <150)
Total CHOL/HDL Ratio: 4 Ratio (ref <5.0)
VLDL: 27 mg/dL (ref <30)

## 2017-07-11 LAB — MAGNESIUM: Magnesium: 1.8 mg/dL (ref 1.5–2.5)

## 2017-07-11 MED ORDER — PHENTERMINE HCL 37.5 MG PO TABS: ORAL_TABLET | ORAL | 2 refills | Status: DC

## 2017-07-11 NOTE — Patient Instructions (Signed)
Simple math prevails.    1st - exercise does not produce significant weight loss - at best one converts fat into muscle , "bulks up", loses inches, but usually stays "weight neutral"     2nd - think of your body weightas a check book: If you eat more calories than you burn up - you save money or gain weight .... Or if you spend more money than you put in the check book, ie burn up more calories than you eat, then you lose weight     3rd - if you walk or run 1 mile, you burn up 100 calories - you have to burn up 3,500 calories to lose 1 pound, ie you have to walk/run 35 miles to lose 1 measly pound. So if you want to lose 10 #, then you have to walk/run 350 miles, so.... clearly exercise is not the solution.     4. So if you consume 1,500 calories, then you have to burn up the equivalent of 15 miles to stay weight neutral - It also stands to reason that if you consume 1,500 cal/day and don't lose weight, then you must be burning up about 1,500 cals/day to stay weight neutral.     5. If you really want to lose weight, you must cut your calorie intake 300 calories /day and at that rate you should lose about 1 # every 3 days.   6. Please purchase Dr Fara Olden Fuhrman's book(s) "The End of Dieting" & "Eat to Live" . It has some great concepts and recipes.        Bad carbs also include fruit juice, alcohol, and sweet tea. These are empty calories that do not signal to your brain that you are full.   Please remember the good carbs are still carbs which convert into sugar. So please measure them out no more than 1/2-1 cup of rice, oatmeal, pasta, and beans  Veggies are however free foods! Pile them on.   Not all fruit is created equal. Please see the list below, the fruit at the bottom is higher in sugars than the fruit at the top. Please avoid all dried fruits.      If you have a smart phone, please look up Smoke Free app, this will help you stay on track and give you information about money you  have saved, life that you have gained back and a ton of more information.   We are giving you chantix for smoking cessation. You can do it! And we are here to help! You may have heard some scary side effects about chantix, the three most common I hear about are nausea, crazy dreams and depression.  However, I like for my patients to try to stay on 1/2 a tablet twice a day rather than one tablet twice a day as normally prescribed. This helps decrease the chances of side effects and helps save money by making a one month prescription last two months  Please start the prescription this way:  Start 1/2 tablet by mouth once daily after food with a full glass of water for 3 days Then do 1/2 tablet by mouth twice daily for 4 days. During this first week you can smoke, but try to stop after this week.  At this point we have several options: 1) continue on 1/2 tablet twice a day- which I encourage you to do. You can stay on this dose the rest of the time on the medication or if you still  feel the need to smoke you can do one of the two options below. 2) do one tablet in the morning and 1/2 in the evening which helps decrease dreams. 3) do one tablet twice a day.   What if I miss a dose? If you miss a dose, take it as soon as you can. If it is almost time for your next dose, take only that dose. Do not take double or extra doses.  What should I watch for while using this medicine? Visit your doctor or health care professional for regular check ups. Ask for ongoing advice and encouragement from your doctor or healthcare professional, friends, and family to help you quit. If you smoke while on this medication, quit again  Your mouth may get dry. Chewing sugarless gum or hard candy, and drinking plenty of water may help. Contact your doctor if the problem does not go away or is severe.  You may get drowsy or dizzy. Do not drive, use machinery, or do anything that needs mental alertness until you know how  this medicine affects you. Do not stand or sit up quickly, especially if you are an older patient.   The use of this medicine may increase the chance of suicidal thoughts or actions. Pay special attention to how you are responding while on this medicine. Any worsening of mood, or thoughts of suicide or dying should be reported to your health care professional right away.  ADVANTAGES OF QUITTING SMOKING  Within 20 minutes, blood pressure decreases. Your pulse is at normal level.  After 8 hours, carbon monoxide levels in the blood return to normal. Your oxygen level increases.  After 24 hours, the chance of having a heart attack starts to decrease. Your breath, hair, and body stop smelling like smoke.  After 48 hours, damaged nerve endings begin to recover. Your sense of taste and smell improve.  After 72 hours, the body is virtually free of nicotine. Your bronchial tubes relax and breathing becomes easier.  After 2 to 12 weeks, lungs can hold more air. Exercise becomes easier and circulation improves.  After 1 year, the risk of coronary heart disease is cut in half.  After 5 years, the risk of stroke falls to the same as a nonsmoker.  After 10 years, the risk of lung cancer is cut in half and the risk of other cancers decreases significantly.  After 15 years, the risk of coronary heart disease drops, usually to the level of a nonsmoker.  You will have extra money to spend on things other than cigarettes.

## 2017-07-12 LAB — HEMOGLOBIN A1C
Hgb A1c MFr Bld: 6.8 % — ABNORMAL HIGH (ref <5.7)
Mean Plasma Glucose: 148 mg/dL

## 2017-08-22 DIAGNOSIS — M25521 Pain in right elbow: Secondary | ICD-10-CM | POA: Diagnosis not present

## 2017-10-10 ENCOUNTER — Ambulatory Visit (INDEPENDENT_AMBULATORY_CARE_PROVIDER_SITE_OTHER): Payer: BLUE CROSS/BLUE SHIELD | Admitting: Internal Medicine

## 2017-10-10 VITALS — BP 124/82 | HR 64 | Temp 97.9°F | Resp 18 | Ht 70.0 in | Wt 252.8 lb

## 2017-10-10 DIAGNOSIS — E559 Vitamin D deficiency, unspecified: Secondary | ICD-10-CM | POA: Diagnosis not present

## 2017-10-10 DIAGNOSIS — E119 Type 2 diabetes mellitus without complications: Secondary | ICD-10-CM

## 2017-10-10 DIAGNOSIS — E782 Mixed hyperlipidemia: Secondary | ICD-10-CM

## 2017-10-10 DIAGNOSIS — Z23 Encounter for immunization: Secondary | ICD-10-CM

## 2017-10-10 DIAGNOSIS — Z79899 Other long term (current) drug therapy: Secondary | ICD-10-CM | POA: Diagnosis not present

## 2017-10-10 DIAGNOSIS — I10 Essential (primary) hypertension: Secondary | ICD-10-CM | POA: Diagnosis not present

## 2017-10-10 NOTE — Progress Notes (Signed)
This very nice 61 y.o. MWMpresents for 3 month follow up with Hypertension, Hyperlipidemia, Pre-Diabetes and Vitamin D Deficiency.      Patient is treated for HTN (2008) & BP has been controlled at home. Today's BP is at goal - 124/82.  Stress Myoview in 2008 was negative. Patient has had no complaints of any cardiac type chest pain, palpitations, dyspnea / orthopnea / PND, dizziness, claudication, or dependent edema.     Hyperlipidemia is controlled with diet & meds. Patient denies myalgias or other med SE's. Current  Lipids are at goal albeit sl elevated Trigs: Lab Results  Component Value Date   CHOL 132 10/10/2017   HDL 32 (L) 10/10/2017   LDLCALC 82 07/11/2017   TRIG 162 (H) 10/10/2017   CHOLHDL 4.1 10/10/2017      Also, the patient has history of T2_NIDDM and admits being off of his Metformin x 2 + months.  and has had no symptoms of reactive hypoglycemia, diabetic polys, paresthesias or visual blurring.  Since off of his Metformin his current  A1c is not at goal:  Lab Results  Component Value Date   HGBA1C 7.2 (H) 10/10/2017      Further, the patient also has history of Vitamin D Deficiency of "35"/2008 and supplements vitamin D without any suspected side-effects. Current  vitamin D is at goal:   Lab Results  Component Value Date   VD25OH 79 10/10/2017   Current Outpatient Prescriptions on File Prior to Visit  Medication Sig  . bisoprolol-hydrochlorothiazide (ZIAC) 5-6.25 MG tablet TAKE ONE TABLET BY MOUTH EVERY MORNING FOR BLOOD PRESSURE  . Cholecalciferol (VITAMIN D3 MAXIMUM STRENGTH) 5000 UNITS capsule Take 10,000 Units by mouth daily.   [name] CINNAMON PO Take 1,000 mg by mouth daily.  . Multiple Vitamin (MULTIVITAMIN WITH MINERALS) TABS Take 1 tablet by mouth daily.  [name] OVER THE COUNTER MEDICATION Take 1 tablet by mouth daily. Balance B50  . phentermine (ADIPEX-P) 37.5 MG tablet TAKE 1/2-1 TABLET BY MOUTH EVERY MORNING FOR DIETING AND WEIGHT LOSS  . vitamin C (ASCORBIC  ACID) 500 MG tablet Take 1,000 mg by mouth daily.  . metFORMIN (GLUCOPHAGE XR) 500 MG 24 hr tablet Take 4 tablets daily - 1 tablet with Breakfast and Lunch & 2 tablets with Supper for Diabetes   No current facility-administered medications on file prior to visit.    Allergies  Allergen Reactions  . Ppd [Tuberculin Purified Protein Derivative]     Pt states he is not allergic to this.Marland Kitchenpositive tb test 2014, pt took tb treatment 2014   PMHx:   Past Medical History:  Diagnosis Date  . Elevated hemoglobin A1c   . Heart murmur    at birth  . History of kidney stones 2011  . Hypertension   . Morbid obesity (BMI 36. 06/07/2015  . 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 Influenza Inj Mdck Quad With Preservative 10/10/2017  . Influenza Split 12/01/2013  . Pneumococcal Polysaccharide-23 10/10/2017  . Td 12/17/2003  . Tdap 02/14/2015   Past Surgical History:  Procedure Laterality Date  . CHOLECYSTECTOMY  07/27/2012   Procedure: LAPAROSCOPIC CHOLECYSTECTOMY WITH INTRAOPERATIVE CHOLANGIOGRAM;  Surgeon: Marcello Moores A. Cornett, MD;  Location: [facility];  Service: General;  Laterality: N/A;  . CYSTOSCOPY/RETROGRADE/URETEROSCOPY/STONE EXTRACTION WITH BASKET  5/11  . HERNIA REPAIR     as a baby  . KNEE SURGERY Right 11/25/11 and  2015   arthroscopic, cortisone injections done also  . TOTAL KNEE ARTHROPLASTY Right 06/06/2015   Procedure: RIGHT TOTAL KNEE ARTHROPLASTY;  Surgeon: Paralee Cancel, MD;  Location: [facility];  Service: Orthopedics;  Laterality: Right;  . WISDOM TOOTH EXTRACTION  yrs ago   FHx:    Reviewed / unchanged  SHx:    Reviewed / unchanged  Systems Review:  Constitutional: Denies fever, chills, wt changes, headaches, insomnia, fatigue, night sweats, change in appetite. Eyes: Denies redness, blurred vision, diplopia, discharge, itchy, watery eyes.  ENT: Denies discharge,  congestion, post nasal drip, epistaxis, sore throat, earache, hearing loss, dental pain, tinnitus, vertigo, sinus pain, snoring.  CV: Denies chest pain, palpitations, irregular heartbeat, syncope, dyspnea, diaphoresis, orthopnea, PND, claudication or edema. Respiratory: denies cough, dyspnea, DOE, pleurisy, hoarseness, laryngitis, wheezing.  Gastrointestinal: Denies dysphagia, odynophagia, heartburn, reflux, water brash, abdominal pain or cramps, nausea, vomiting, bloating, diarrhea, constipation, hematemesis, melena, hematochezia  or hemorrhoids. Genitourinary: Denies dysuria, frequency, urgency, nocturia, hesitancy, discharge, hematuria or flank pain. Musculoskeletal: Denies arthralgias, myalgias, stiffness, jt. swelling, pain, limping or strain/sprain.  Skin: Denies pruritus, rash, hives, warts, acne, eczema or change in skin lesion(s). Neuro: No weakness, tremor, incoordination, spasms, paresthesia or pain. Psychiatric: Denies confusion, memory loss or sensory loss. Endo: Denies change in weight, skin or hair change.  Heme/Lymph: No excessive bleeding, bruising or enlarged lymph nodes.  Physical Exam  BP 124/82   Pulse 64   Temp 97.9 F (36.6 C)   Resp 18   Ht 5\' 10"  (1.778 m)   Wt 252 lb 12.8 oz (114.7 kg)   BMI 36.27 kg/m   Appears well nourished, well groomed  and in no distress.  Eyes: PERRLA, EOMs, conjunctiva no swelling or erythema. Sinuses: No frontal/maxillary tenderness ENT/Mouth: EAC's clear, TM's nl w/o erythema, bulging. Nares clear w/o erythema, swelling, exudates. Oropharynx clear without erythema or exudates. Oral hygiene is good. Tongue normal, non obstructing. Hearing intact.  Neck: Supple. Thyroid nl. Car 2+/2+ without bruits, nodes or JVD. Chest: Respirations nl with BS clear & equal w/o rales, rhonchi, wheezing or stridor.  Cor: Heart sounds normal w/ regular rate and rhythm without sig. murmurs, gallops, clicks or rubs. Peripheral pulses normal and equal   without edema.  Abdomen: Soft & bowel sounds normal. Non-tender w/o guarding, rebound, hernias, masses or organomegaly.  Lymphatics: Unremarkable.  Musculoskeletal: Full ROM all peripheral extremities, joint stability, 5/5 strength and normal gait.  Skin: Warm, dry without exposed rashes, lesions or ecchymosis apparent.  Neuro: Cranial nerves intact, reflexes equal bilaterally. Sensory-motor testing grossly intact. Tendon reflexes grossly intact.  Pysch: Alert & oriented x 3.  Insight and judgement nl & appropriate. No ideations.  Assessment and Plan:  1. Essential hypertension  - Continue medication, monitor blood pressure at home.  - Continue DASH diet. Reminder to go to the ER if any C  - CBC with Differential/Platelet - BASIC METABOLIC PANEL WITH GFR - Magnesium - TSH  2. Hyperlipemia, mixed  - Continue diet/meds, exercise,& lifestyle modifications.  - Continue monitor periodic cholesterol/liver & renal functions   - Hepatic function panel - Lipid panel - TSH  3. Diabetes mellitus without complication ([place])  - Continue diet, exercise, lifestyle modifications.  - Monitor appropriate labs.  - Hemoglobin A1c - Insulin, random  4. Vitamin D deficiency  - Continue supplementation.  - VITAMIN D 25 Hydroxy  5. Medication management  - CBC with Differential/Platelet - BASIC METABOLIC PANEL WITH GFR - Hepatic function panel - Magnesium - Lipid panel -  TSH - Hemoglobin A1c - Insulin, random - VITAMIN D 25 Hydroxy   6. Need for immunization against influenza  - FLU VACCINE MDCK QUAD W/Preservative  7. Need for prophylactic vaccination against Streptococcus pneumoniae (pneumococcus)  - Pneumococcal polysaccharide vaccine 23-valent greater than or equal to 62yo subcutaneous/IM       Discussed  regular exercise, BP monitoring, weight control to achieve/maintain BMI less than 25 and discussed med and SE's. Recommended labs to assess and monitor clinical status with  further disposition pending results of labs. Over 30 minutes of exam, counseling, chart review was performed.

## 2017-10-10 NOTE — Patient Instructions (Signed)

## 2017-10-11 ENCOUNTER — Encounter: Payer: Self-pay | Admitting: Internal Medicine

## 2017-10-14 LAB — BASIC METABOLIC PANEL WITH GFR
BUN: 18 mg/dL (ref 7–25)
CO2: 34 mmol/L — AB (ref 20–32)
Calcium: 10 mg/dL (ref 8.6–10.3)
Chloride: 102 mmol/L (ref 98–110)
Creat: 0.9 mg/dL (ref 0.70–1.25)
GFR, EST AFRICAN AMERICAN: 106 mL/min/{1.73_m2} (ref >=60)
GFR, EST NON AFRICAN AMERICAN: 91 mL/min/{1.73_m2} (ref >=60)
Glucose, Bld: 152 mg/dL — ABNORMAL HIGH (ref 65–99)
POTASSIUM: 5.5 mmol/L — AB (ref 3.5–5.3)
SODIUM: 142 mmol/L (ref 135–146)

## 2017-10-14 LAB — CBC WITH DIFFERENTIAL/PLATELET
BASOS ABS: 49 {cells}/uL (ref 0–200)
Basophils Relative: 0.7 %
EOS PCT: 1.6 %
Eosinophils Absolute: 112 cells/uL (ref 15–500)
HEMATOCRIT: 46.9 % (ref 38.5–50.0)
Hemoglobin: 15.8 g/dL (ref 13.2–17.1)
LYMPHS ABS: 1967 {cells}/uL (ref 850–3900)
MCH: 30.2 pg (ref 27.0–33.0)
MCHC: 33.7 g/dL (ref 32.0–36.0)
MCV: 89.7 fL (ref 80.0–100.0)
MPV: 10.9 fL (ref 7.5–12.5)
Monocytes Relative: 10.8 %
NEUTROS PCT: 58.8 %
Neutro Abs: 4116 cells/uL (ref 1500–7800)
PLATELETS: 230 10*3/uL (ref 140–400)
RBC: 5.23 10*6/uL (ref 4.20–5.80)
RDW: 12.9 % (ref 11.0–15.0)
TOTAL LYMPHOCYTE: 28.1 %
WBC mixed population: 756 cells/uL (ref 200–950)
WBC: 7 10*3/uL (ref 3.8–10.8)

## 2017-10-14 LAB — HEPATIC FUNCTION PANEL
AG Ratio: 1.7 (calc) (ref 1.0–2.5)
ALKALINE PHOSPHATASE (APISO): 77 U/L (ref 40–115)
ALT: 29 U/L (ref 9–46)
AST: 21 U/L (ref 10–35)
Albumin: 4.3 g/dL (ref 3.6–5.1)
BILIRUBIN DIRECT: 0.1 mg/dL (ref 0.0–0.2)
BILIRUBIN TOTAL: 0.6 mg/dL (ref 0.2–1.2)
Globulin: 2.6 g/dL (calc) (ref 1.9–3.7)
Indirect Bilirubin: 0.5 mg/dL (calc) (ref 0.2–1.2)
Total Protein: 6.9 g/dL (ref 6.1–8.1)

## 2017-10-14 LAB — LIPID PANEL
CHOL/HDL RATIO: 4.1 (calc) (ref <5.0)
Cholesterol: 132 mg/dL (ref <200)
HDL: 32 mg/dL — ABNORMAL LOW (ref >40)
LDL Cholesterol (Calc): 73 mg/dL (calc)
NON-HDL CHOLESTEROL (CALC): 100 mg/dL (ref <130)
Triglycerides: 162 mg/dL — ABNORMAL HIGH (ref <150)

## 2017-10-14 LAB — VITAMIN D 25 HYDROXY (VIT D DEFICIENCY, FRACTURES): Vit D, 25-Hydroxy: 79 ng/mL (ref 30–100)

## 2017-10-14 LAB — HEMOGLOBIN A1C
HEMOGLOBIN A1C: 7.2 %{Hb} — AB (ref <5.7)
MEAN PLASMA GLUCOSE: 160 (calc)
eAG (mmol/L): 8.9 (calc)

## 2017-10-14 LAB — MAGNESIUM: Magnesium: 1.9 mg/dL (ref 1.5–2.5)

## 2017-10-14 LAB — TSH: TSH: 1.95 mIU/L (ref 0.40–4.50)

## 2017-10-14 LAB — INSULIN, RANDOM: Insulin: 5.4 u[IU]/mL (ref 2.0–19.6)

## 2017-10-17 ENCOUNTER — Other Ambulatory Visit: Payer: Self-pay | Admitting: *Deleted

## 2017-10-17 MED ORDER — BISOPROLOL-HYDROCHLOROTHIAZIDE 5-6.25 MG PO TABS: ORAL_TABLET | ORAL | 1 refills | Status: DC

## 2017-10-20 ENCOUNTER — Other Ambulatory Visit: Payer: Self-pay | Admitting: *Deleted

## 2017-10-20 DIAGNOSIS — E119 Type 2 diabetes mellitus without complications: Secondary | ICD-10-CM

## 2017-10-20 MED ORDER — METFORMIN HCL ER 500 MG PO TB24: ORAL_TABLET | ORAL | 1 refills | Status: DC

## 2017-11-14 DIAGNOSIS — E119 Type 2 diabetes mellitus without complications: Secondary | ICD-10-CM | POA: Diagnosis not present

## 2017-12-17 DIAGNOSIS — J069 Acute upper respiratory infection, unspecified: Secondary | ICD-10-CM | POA: Diagnosis not present

## 2018-01-10 DIAGNOSIS — J209 Acute bronchitis, unspecified: Secondary | ICD-10-CM | POA: Diagnosis not present

## 2018-01-15 NOTE — Progress Notes (Signed)
FOLLOW UP  Assessment and Plan:   Hypertension Well controlled with current medications  Monitor blood pressure at home; patient to call if consistently greater than 130/80 Continue DASH diet.   Reminder to go to the ER if any CP, SOB, nausea, dizziness, severe HA, changes vision/speech, left arm numbness and tingling and jaw pain.  Cholesterol Currently at LDL goal; diet for elevated triglycerides discussed Continue low cholesterol diet and exercise.  Check lipid panel.   Diabetes without complications Currently aggressively pursuing lifestyle modification and weight loss Continue metformin Continue diet and exercise.  Perform daily foot/skin check, notify office of any concerning changes.  Check A1C  Obesity with co morbidities Long discussion about weight loss, diet, and exercise Recommended diet heavy in fruits and veggies and low in animal meats, cheeses, and dairy products, appropriate calorie intake Discussed ideal weight for height and initial weight goal (220 lb) Patient will work on increasing vegetable intake  Will follow up in 3 months  Vitamin D Def At goal at last visit; continue supplementation to maintain goal of 70-100 Defer Vit D level  Continue diet and meds as discussed. Further disposition pending results of labs. Discussed med's effects and SE's.   Over 30 minutes of exam, counseling, chart review, and critical decision making was performed.   Future Appointments  Date Time Provider [place]  04/06/2018  9:00 AM Unk Pinto, MD [facility] None    ----------------------------------------------------------------------------------------------------------------------  HPI 62 y.o. male  presents for 3 month follow up on hypertension, cholesterol, diabetes, morbid obesity and vitamin D deficiency.   he is prescribed phentermine for weight loss.  While on the medication they have lost 9 lbs since last visit. They deny palpitations, anxiety,  trouble sleeping, elevated BP.   BMI is Body mass index is 34.95 kg/m., he is working on diet and exercise. Wt Readings from Last 3 Encounters:  01/16/18 243 lb 9.6 oz (110.5 kg)  10/10/17 252 lb 12.8 oz (114.7 kg)  07/11/17 246 lb (111.6 kg)   Typical breakfast: Oatmeal with small amount of butter, syrup, cinnamon -  Mid morning snack: banana  Typical lunch: ceasar salad with chicken Typical dinner: chicken, chilli, spaghetti, vegetable such as cauliflower/broccoli Exercise: Golfs, on feet all day at work  Sweet treat once a week Water intake: Drinks only water throughout day after coffee  His blood pressure has been controlled at home, today their BP is BP: 136/82  He does workout. He denies chest pain, shortness of breath, dizziness.   He is on cholesterol medication and denies myalgias. His cholesterol is at goal. The cholesterol last visit was:   Lab Results  Component Value Date   CHOL 132 10/10/2017   HDL 32 (L) 10/10/2017   LDLCALC 82 07/11/2017   TRIG 162 (H) 10/10/2017   CHOLHDL 4.1 10/10/2017    He has been working on diet and exercise for T2DM, and denies increased appetite, nausea, paresthesia of the feet, polydipsia, polyuria, visual disturbances and vomiting. Last A1C in the office was:  Lab Results  Component Value Date   HGBA1C 7.2 (H) 10/10/2017   Patient is on Vitamin D supplement and at goal:    Lab Results  Component Value Date   VD25OH 79 10/10/2017        Current Medications:  Current Outpatient Medications on File Prior to Visit  Medication Sig  . albuterol (PROAIR HFA) 108 (90 Base) MCG/ACT inhaler Inhale 1-2 puffs into the lungs every 6 (six) hours as needed for wheezing  or shortness of breath.  . benzonatate (TESSALON PERLES) 100 MG capsule Take by mouth 3 (three) times daily as needed for cough.  . bisoprolol-hydrochlorothiazide (ZIAC) 5-6.25 MG tablet TAKE ONE TABLET BY MOUTH EVERY MORNING FOR BLOOD PRESSURE  . cefdinir (OMNICEF) 300 MG  capsule Take 300 mg by mouth 2 (two) times daily.  . Cholecalciferol (VITAMIN D3 MAXIMUM STRENGTH) 5000 UNITS capsule Take 10,000 Units by mouth daily.   [name] CINNAMON PO Take 1,000 mg by mouth daily.  . metFORMIN (GLUCOPHAGE XR) 500 MG 24 hr tablet Take 4 tablets daily - 1 tablet with Breakfast and Lunch & 2 tablets with Supper for Diabetes  . Multiple Vitamin (MULTIVITAMIN WITH MINERALS) TABS Take 1 tablet by mouth daily.  [name] OVER THE COUNTER MEDICATION Take 1 tablet by mouth daily. Balance B50  . phentermine (ADIPEX-P) 37.5 MG tablet TAKE 1/2-1 TABLET BY MOUTH EVERY MORNING FOR DIETING AND WEIGHT LOSS  . vitamin C (ASCORBIC ACID) 500 MG tablet Take 1,000 mg by mouth daily.   No current facility-administered medications on file prior to visit.      Allergies:  Allergies  Allergen Reactions  . Ppd [Tuberculin Purified Protein Derivative]     Pt states he is not allergic to this.Marland Kitchenpositive tb test 2014, pt took tb treatment 2014     Medical History:  Past Medical History:  Diagnosis Date  . Elevated hemoglobin A1c   . Heart murmur    at birth  . History of kidney stones 2011  . Hypertension   . Morbid obesity (BMI 36. 06/07/2015  . 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  HENT: Negative for hearing loss and tinnitus.   Eyes: Negative for blurred vision and double vision.  Respiratory: Negative for cough, shortness of breath and wheezing.   Cardiovascular: Negative for chest pain, palpitations, orthopnea, claudication and leg swelling.  Gastrointestinal: Negative for abdominal pain, blood in stool, constipation, diarrhea, heartburn, melena, nausea and vomiting.  Genitourinary: Negative.   Musculoskeletal: Negative for joint pain and myalgias.  Skin:  Negative for rash.  Neurological: Negative for dizziness, tingling, sensory change, weakness and headaches.  Endo/Heme/Allergies: Negative for polydipsia.  Psychiatric/Behavioral: Negative.   All other systems reviewed and are negative.   Physical Exam: BP 136/82   Pulse (!) 57   Temp 97.9 F (36.6 C)   Ht 5\' 10"  (1.778 m)   Wt 243 lb 9.6 oz (110.5 kg)   SpO2 97%   BMI 34.95 kg/m  Wt Readings from Last 3 Encounters:  01/16/18 243 lb 9.6 oz (110.5 kg)  10/10/17 252 lb 12.8 oz (114.7 kg)  07/11/17 246 lb (111.6 kg)   General Appearance: Well nourished, obese, in no apparent distress. Eyes: PERRLA, EOMs, conjunctiva no swelling or erythema Sinuses: No Frontal/maxillary tenderness ENT/Mouth: Ext aud canals clear, TMs without erythema, bulging. No erythema, swelling, or exudate on post pharynx.  Tonsils not swollen or erythematous. Hearing normal.  Neck: Supple, thyroid normal.  Respiratory: Respiratory effort normal, BS equal bilaterally without rales, rhonchi, wheezing or stridor.  Cardio: RRR with no MRGs. Brisk peripheral pulses without edema.  Abdomen: Soft, + BS.  Non tender, no guarding, rebound, hernias, masses. Lymphatics: Non tender without lymphadenopathy.  Musculoskeletal: Full ROM, 5/5 strength, Normal gait Skin: Warm, dry without rashes, lesions, ecchymosis.  Neuro: Cranial nerves intact. No cerebellar symptoms.  Psych: Awake and oriented X 3, normal affect, Insight and Judgment appropriate.    Izora Ribas, NP 10:48 AM [facility]

## 2018-01-16 ENCOUNTER — Ambulatory Visit: Payer: BLUE CROSS/BLUE SHIELD | Admitting: Adult Health

## 2018-01-16 ENCOUNTER — Encounter: Payer: Self-pay | Admitting: Adult Health

## 2018-01-16 VITALS — BP 136/82 | HR 57 | Temp 97.9°F | Ht 70.0 in | Wt 243.6 lb

## 2018-01-16 DIAGNOSIS — I10 Essential (primary) hypertension: Secondary | ICD-10-CM

## 2018-01-16 DIAGNOSIS — E119 Type 2 diabetes mellitus without complications: Secondary | ICD-10-CM | POA: Diagnosis not present

## 2018-01-16 DIAGNOSIS — E559 Vitamin D deficiency, unspecified: Secondary | ICD-10-CM | POA: Diagnosis not present

## 2018-01-16 DIAGNOSIS — E782 Mixed hyperlipidemia: Secondary | ICD-10-CM | POA: Diagnosis not present

## 2018-01-16 DIAGNOSIS — Z79899 Other long term (current) drug therapy: Secondary | ICD-10-CM

## 2018-01-16 NOTE — Patient Instructions (Signed)
    When it comes to diets, agreement about the perfect plan isn't easy to find, even among the experts. Experts at the [employer] developed an idea known as the Healthy Eating Plate. Just imagine a plate divided into logical, healthy portions.  The emphasis is on diet quality:  Load up on vegetables and fruits - one-half of your plate: Aim for color and variety, and remember that potatoes don't count.  Go for whole grains - one-quarter of your plate: Whole wheat, barley, wheat berries, quinoa, oats, brown rice, and foods made with them. If you want pasta, go with whole wheat pasta.  Protein power - one-quarter of your plate: Fish, chicken, beans, and nuts are all healthy, versatile protein sources. Limit red meat.  The diet, however, does go beyond the plate, offering a few other suggestions.  Use healthy plant oils, such as olive, canola, soy, corn, sunflower and peanut. Check the labels, and avoid partially hydrogenated oil, which have unhealthy trans fats.  If you're thirsty, drink water. Coffee and tea are good in moderation, but skip sugary drinks and limit milk and dairy products to one or two daily servings.  The type of carbohydrate in the diet is more important than the amount. Some sources of carbohydrates, such as vegetables, fruits, whole grains, and beans--are healthier than others.  Finally, stay active.  

## 2018-01-17 LAB — CBC WITH DIFFERENTIAL/PLATELET
Basophils Absolute: 35 cells/uL (ref 0–200)
Basophils Relative: 0.2 %
EOS ABS: 17 {cells}/uL (ref 15–500)
Eosinophils Relative: 0.1 %
HEMATOCRIT: 51.8 % — AB (ref 38.5–50.0)
Hemoglobin: 17.4 g/dL — ABNORMAL HIGH (ref 13.2–17.1)
LYMPHS ABS: 1757 {cells}/uL (ref 850–3900)
MCH: 29.8 pg (ref 27.0–33.0)
MCHC: 33.6 g/dL (ref 32.0–36.0)
MCV: 88.9 fL (ref 80.0–100.0)
MPV: 10.6 fL (ref 7.5–12.5)
Monocytes Relative: 5.9 %
NEUTROS ABS: 14564 {cells}/uL — AB (ref 1500–7800)
Neutrophils Relative %: 83.7 %
Platelets: 265 10*3/uL (ref 140–400)
RBC: 5.83 10*6/uL — ABNORMAL HIGH (ref 4.20–5.80)
RDW: 13.1 % (ref 11.0–15.0)
Total Lymphocyte: 10.1 %
WBC mixed population: 1027 cells/uL — ABNORMAL HIGH (ref 200–950)
WBC: 17.4 10*3/uL — ABNORMAL HIGH (ref 3.8–10.8)

## 2018-01-17 LAB — LIPID PANEL
Cholesterol: 156 mg/dL (ref <200)
HDL: 52 mg/dL (ref >40)
LDL CHOLESTEROL (CALC): 73 mg/dL
Non-HDL Cholesterol (Calc): 104 mg/dL (calc) (ref <130)
Total CHOL/HDL Ratio: 3 (calc) (ref <5.0)
Triglycerides: 211 mg/dL — ABNORMAL HIGH (ref <150)

## 2018-01-17 LAB — BASIC METABOLIC PANEL WITH GFR
BUN: 18 mg/dL (ref 7–25)
CO2: 31 mmol/L (ref 20–32)
Calcium: 10.2 mg/dL (ref 8.6–10.3)
Chloride: 99 mmol/L (ref 98–110)
Creat: 0.83 mg/dL (ref 0.70–1.25)
GFR, EST NON AFRICAN AMERICAN: 94 mL/min/{1.73_m2} (ref >=60)
GFR, Est African American: 109 mL/min/{1.73_m2} (ref >=60)
Glucose, Bld: 232 mg/dL — ABNORMAL HIGH (ref 65–99)
Potassium: 4.6 mmol/L (ref 3.5–5.3)
SODIUM: 140 mmol/L (ref 135–146)

## 2018-01-17 LAB — HEPATIC FUNCTION PANEL
AG RATIO: 1.7 (calc) (ref 1.0–2.5)
ALKALINE PHOSPHATASE (APISO): 70 U/L (ref 40–115)
ALT: 45 U/L (ref 9–46)
AST: 20 U/L (ref 10–35)
Albumin: 4.3 g/dL (ref 3.6–5.1)
BILIRUBIN INDIRECT: 0.3 mg/dL (ref 0.2–1.2)
BILIRUBIN TOTAL: 0.4 mg/dL (ref 0.2–1.2)
Bilirubin, Direct: 0.1 mg/dL (ref 0.0–0.2)
Globulin: 2.5 g/dL (calc) (ref 1.9–3.7)
Total Protein: 6.8 g/dL (ref 6.1–8.1)

## 2018-01-17 LAB — HEMOGLOBIN A1C
HEMOGLOBIN A1C: 7.7 %{Hb} — AB (ref <5.7)
Mean Plasma Glucose: 174 (calc)
eAG (mmol/L): 9.7 (calc)

## 2018-01-17 LAB — TSH: TSH: 0.85 m[IU]/L (ref 0.40–4.50)

## 2018-04-06 ENCOUNTER — Encounter: Payer: Self-pay | Admitting: Internal Medicine

## 2018-04-14 ENCOUNTER — Other Ambulatory Visit: Payer: Self-pay | Admitting: Internal Medicine

## 2018-04-14 DIAGNOSIS — E119 Type 2 diabetes mellitus without complications: Secondary | ICD-10-CM

## 2018-04-24 ENCOUNTER — Encounter: Payer: Self-pay | Admitting: Internal Medicine

## 2018-04-26 ENCOUNTER — Other Ambulatory Visit: Payer: Self-pay | Admitting: Internal Medicine

## 2018-04-30 NOTE — Progress Notes (Signed)
[facility]   Unk Pinto, M.D.     Uvaldo Bristle. Silverio Lay, P.A.-C [name], [place]                [address] [place], [place] [address] Telephone [phone] Telefax [phone] Annual  Screening/Preventative Visit  & Comprehensive Evaluation & Examination     This very nice 62 y.o. MWM  presents for a Screening/Preventative Visit & comprehensive evaluation and management of multiple medical co-morbidities.  Patient has been followed for HTN, HLD, T2_NIDDM  and Vitamin D Deficiency.     HTN predates circa 2008. Patient's BP has been controlled at home.  Today's BP is at goal - 132/74. Patient had a negative Stress Myoview in 2008.  Patient denies any cardiac symptoms as chest pain, palpitations, shortness of breath, dizziness or ankle swelling.     Patient's hyperlipidemia is controlled with diet and medications. Patient denies myalgias or other medication SE's. Last lipids were at goal albeit elevated Trig's: Lab Results  Component Value Date   CHOL 156 01/16/2018   HDL 52 01/16/2018   LDLCALC 73 01/16/2018   TRIG 211 (H) 01/16/2018   CHOLHDL 3.0 01/16/2018      Patient has Morbid Obesity (BMI 36+) and was dx'd PreDM in 2009 (A1c 6.0%) and then dx'd T2_DM with A1c 6.7% in Dec 2016 and patient denies reactive hypoglycemic symptoms, visual blurring, diabetic polys or paresthesias. Last A1c was not at goal: Lab Results  Component Value Date   HGBA1C 7.7 (H) 01/16/2018       Finally, patient has history of Vitamin D Deficiency ("35"/2008)  and last vitamin D was at goal: Lab Results  Component Value Date   VD25OH 79 10/10/2017   Current Outpatient Medications on File Prior to Visit  Medication Sig  . albuterol (PROAIR HFA) 108 (90 Base) MCG/ACT inhaler Inhale 1-2 puffs into the lungs every 6 (six) hours as needed for wheezing or shortness of breath.  .  bisoprolol-hydrochlorothiazide (ZIAC) 5-6.25 MG tablet TAKE ONE TABLET BY MOUTH EVERY MORNING FOR BLOOD PRESSURE  . Cholecalciferol (VITAMIN D3 MAXIMUM STRENGTH) 5000 UNITS capsule Take 10,000 Units by mouth daily.   [name] CINNAMON PO Take 1,000 mg by mouth daily.  . metFORMIN (GLUCOPHAGE-XR) 500 MG 24 hr tablet TAKE FOUR TABLETS BY MOUTH DAILY - 1 WITH BREAKFAST AND LUNCH AND 2 WITH SUPPER FOR DIABETES  . Multiple Vitamin (MULTIVITAMIN WITH MINERALS) TABS Take 1 tablet by mouth daily.  [name] OVER THE COUNTER MEDICATION Take 1 tablet by mouth daily. Balance B50  . phentermine (ADIPEX-P) 37.5 MG tablet TAKE 1/2 TO 1 TABLET BY MOUTH EVERY MORNING FOR DIETING AND WEIGHT LOSS  . vitamin C (ASCORBIC ACID) 500 MG tablet Take 1,000 mg by mouth daily.   No current facility-administered medications on file prior to visit.    Allergies  Allergen Reactions  . Ppd [Tuberculin Purified Protein Derivative]     Pt states he is not allergic to this.Marland Kitchenpositive tb test 2014, pt took tb treatment 2014   Past Medical History:  Diagnosis Date  . Elevated hemoglobin A1c   . Heart murmur    at birth  . History of kidney stones 2011  . Hypertension   . Morbid obesity (BMI 36. 06/07/2015  . 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 OPHTHALMOLOGY EXAM  [birth year]16/1965  . COLONOSCOPY  12/16/2004  . URINE MICROALBUMIN  03/19/2018  . INFLUENZA VACCINE  07/16/2018  . HEMOGLOBIN A1C  07/16/2018  . FOOT EXAM  05/02/2019  . PNEUMOCOCCAL POLYSACCHARIDE VACCINE (2) 10/10/2022  . TETANUS/TDAP  02/13/2025  . Hepatitis C Screening  Completed  . HIV Screening  Completed   Immunization History  Administered Date(s) Administered  . Influenza Inj Mdck Quad With Preservative 10/10/2017  . Influenza Split 12/01/2013  . Pneumococcal Polysaccharide-23 10/10/2017  . Td 12/17/2003  . Tdap 02/14/2015   Last Colon - 11/2006 - Dr Arty Baumgartner - recc 10  yr F/U - overdue   Past Surgical History:  Procedure Laterality Date  . CHOLECYSTECTOMY  07/27/2012   Procedure: LAPAROSCOPIC CHOLECYSTECTOMY WITH INTRAOPERATIVE CHOLANGIOGRAM;  Surgeon: Marcello Moores A. Cornett, MD;  Location: [facility];  Service: General;  Laterality: N/A;  . CYSTOSCOPY/RETROGRADE/URETEROSCOPY/STONE EXTRACTION WITH BASKET  5/11  . HERNIA REPAIR     as a baby  . KNEE SURGERY Right 11/25/11 and 2015   arthroscopic, cortisone injections done also  . TOTAL KNEE ARTHROPLASTY Right 06/06/2015   Procedure: RIGHT TOTAL KNEE ARTHROPLASTY;  Surgeon: Paralee Cancel, MD;  Location: [facility];  Service: Orthopedics;  Laterality: Right;  . WISDOM TOOTH EXTRACTION  yrs ago   Family History  Problem Relation Age of Onset  . Hypertension Mother   . Diabetes Mother   . Kidney failure Mother   . Diabetes Father   . Diabetes Sister   . Diabetes Brother     ROS Constitutional: Denies fever, chills, weight loss/gain, headaches, insomnia,  night sweats or change in appetite. Does c/o fatigue. Eyes: Denies redness, blurred vision, diplopia, discharge, itchy or watery eyes.  ENT: Denies discharge, congestion, post nasal drip, epistaxis, sore throat, earache, hearing loss, dental pain, Tinnitus, Vertigo, Sinus pain or snoring.  Cardio: Denies chest pain, palpitations, irregular heartbeat, syncope, dyspnea, diaphoresis, orthopnea, PND, claudication or edema Respiratory: denies cough, dyspnea, DOE, pleurisy, hoarseness, laryngitis or wheezing.  Gastrointestinal: Denies dysphagia, heartburn, reflux, water brash, pain, cramps, nausea, vomiting, bloating, diarrhea, constipation, hematemesis, melena, hematochezia, jaundice or hemorrhoids Genitourinary: Denies dysuria, frequency, urgency, nocturia, hesitancy, discharge, hematuria or flank pain Musculoskeletal: Denies arthralgia, myalgia, stiffness, Jt. Swelling, pain, limp or strain/sprain. Denies Falls. Skin: Denies puritis, rash, hives, warts, acne, eczema or  change in skin lesion Neuro: No weakness, tremor, incoordination, spasms, paresthesia or pain Psychiatric: Denies confusion, memory loss or sensory loss. Denies Depression. Endocrine: Denies change in weight, skin, hair change, nocturia, and paresthesia, diabetic polys, visual blurring or hyper / hypo glycemic episodes.  Heme/Lymph: No excessive bleeding, bruising or enlarged lymph nodes.  Physical Exam  BP 132/74   Pulse 61   Temp 97.9 F (36.6 C)   Resp 16   Ht 5' 9.5" (1.765 m)   Wt 251 lb 12.8 oz (114.2 kg)   SpO2 99%   BMI 36.65 kg/m   General Appearance: Over nourished and well groomed and in no apparent distress.  Eyes: PERRLA, EOMs, conjunctiva no swelling or erythema, normal fundi and vessels. Sinuses: No frontal/maxillary tenderness ENT/Mouth: EACs patent / TMs  nl. Nares clear without erythema, swelling, mucoid exudates. Oral hygiene is good. No erythema, swelling, or exudate. Tongue normal, non-obstructing. Tonsils not swollen or erythematous. Hearing normal.  Neck: Supple, thyroid not palpable. No bruits, nodes or JVD. Respiratory: Respiratory effort normal.  BS equal and clear bilateral without rales, rhonci, wheezing or stridor. Cardio: Heart sounds are normal with  regular rate and rhythm and no murmurs, rubs or gallops. Peripheral pulses are normal and equal bilaterally without edema. No aortic or femoral bruits. Chest: symmetric with normal excursions and percussion.  Abdomen: Soft, with Nl bowel sounds. Nontender, no guarding, rebound, hernias, masses, or organomegaly.  Lymphatics: Non tender without lymphadenopathy.  Genitourinary: No hernias.Testes nl. DRE - prostate nl for age - smooth & firm w/o nodules. Musculoskeletal: Full ROM all peripheral extremities, joint stability, 5/5 strength, and normal gait. Skin: Warm and dry without rashes, lesions, cyanosis, clubbing or  ecchymosis.  Neuro: Cranial nerves intact, reflexes equal bilaterally. Normal muscle tone, no  cerebellar symptoms. Sensation intact.  Pysch: Alert and oriented X 3 with normal affect, insight and judgment appropriate.   Assessment and Plan  1. Annual Preventative/Screening Exam   2. Essential hypertension  - EKG 12-Lead - [place], RETROPERITNL ABD,  LTD - Urinalysis, Routine w reflex microscopic - Microalbumin / creatinine urine ratio - CBC with Differential/Platelet - COMPLETE METABOLIC PANEL WITH GFR - Magnesium - TSH  3. Hyperlipemia, mixed  - EKG 12-Lead - [place], RETROPERITNL ABD,  LTD - Lipid panel - TSH  4. Type 2 diabetes mellitus without complication, without long-term current use of insulin (HCC)  - EKG 12-Lead - [place], RETROPERITNL ABD,  LTD - Urinalysis, Routine w reflex microscopic - Microalbumin / creatinine urine ratio - HM DIABETES FOOT EXAM - LOW EXTREMITY NEUR EXAM DOCUM - Hemoglobin A1c - Insulin, random  5. Vitamin D deficiency  - VITAMIN D 25 Hydroxy  6. Morbid obesity (BMI 36.31)    7. Screening for colorectal cancer   8. Prostate cancer screening  - PSA  9. Screening for ischemic heart disease  - EKG 12-Lead  10. FH: hypertension  - [place], RETROPERITNL ABD,  LTD  11. Former smoker  - EKG 12-Lead - [place], RETROPERITNL ABD,  LTD  12. Screening for AAA (aortic abdominal aneurysm)  - EKG 12-Lead - [place], RETROPERITNL ABD,  LTD  13. Fatigue  - Iron,Total/Total Iron Binding Cap - Vitamin B12 - Testosterone - TSH  14. Medication management  - Urinalysis, Routine w reflex microscopic - Microalbumin / creatinine urine ratio           Patient was counseled in prudent diet, weight control to achieve/maintain BMI less than 25, BP monitoring, regular exercise and medications as discussed.  Discussed med effects and SE's. Routine screening labs and tests as requested with regular follow-up as recommended. Over 40 minutes of exam, counseling, chart review and high complex critical decision making was performed

## 2018-05-01 ENCOUNTER — Ambulatory Visit (INDEPENDENT_AMBULATORY_CARE_PROVIDER_SITE_OTHER): Payer: BLUE CROSS/BLUE SHIELD | Admitting: Internal Medicine

## 2018-05-01 ENCOUNTER — Encounter: Payer: Self-pay | Admitting: Internal Medicine

## 2018-05-01 VITALS — BP 132/74 | HR 61 | Temp 97.9°F | Resp 16 | Ht 69.5 in | Wt 251.8 lb

## 2018-05-01 DIAGNOSIS — Z87891 Personal history of nicotine dependence: Secondary | ICD-10-CM

## 2018-05-01 DIAGNOSIS — E119 Type 2 diabetes mellitus without complications: Secondary | ICD-10-CM

## 2018-05-01 DIAGNOSIS — I10 Essential (primary) hypertension: Secondary | ICD-10-CM | POA: Diagnosis not present

## 2018-05-01 DIAGNOSIS — Z8249 Family history of ischemic heart disease and other diseases of the circulatory system: Secondary | ICD-10-CM

## 2018-05-01 DIAGNOSIS — Z13220 Encounter for screening for lipoid disorders: Secondary | ICD-10-CM

## 2018-05-01 DIAGNOSIS — Z1211 Encounter for screening for malignant neoplasm of colon: Secondary | ICD-10-CM

## 2018-05-01 DIAGNOSIS — Z130 Encounter for screening for diseases of the blood and blood-forming organs and certain disorders involving the immune mechanism: Secondary | ICD-10-CM | POA: Diagnosis not present

## 2018-05-01 DIAGNOSIS — Z1389 Encounter for screening for other disorder: Secondary | ICD-10-CM | POA: Diagnosis not present

## 2018-05-01 DIAGNOSIS — Z111 Encounter for screening for respiratory tuberculosis: Secondary | ICD-10-CM

## 2018-05-01 DIAGNOSIS — Z79899 Other long term (current) drug therapy: Secondary | ICD-10-CM

## 2018-05-01 DIAGNOSIS — E782 Mixed hyperlipidemia: Secondary | ICD-10-CM

## 2018-05-01 DIAGNOSIS — Z131 Encounter for screening for diabetes mellitus: Secondary | ICD-10-CM | POA: Diagnosis not present

## 2018-05-01 DIAGNOSIS — Z0000 Encounter for general adult medical examination without abnormal findings: Secondary | ICD-10-CM

## 2018-05-01 DIAGNOSIS — Z136 Encounter for screening for cardiovascular disorders: Secondary | ICD-10-CM | POA: Diagnosis not present

## 2018-05-01 DIAGNOSIS — Z125 Encounter for screening for malignant neoplasm of prostate: Secondary | ICD-10-CM

## 2018-05-01 DIAGNOSIS — E559 Vitamin D deficiency, unspecified: Secondary | ICD-10-CM | POA: Diagnosis not present

## 2018-05-01 DIAGNOSIS — Z0001 Encounter for general adult medical examination with abnormal findings: Secondary | ICD-10-CM

## 2018-05-01 DIAGNOSIS — Z1212 Encounter for screening for malignant neoplasm of rectum: Secondary | ICD-10-CM

## 2018-05-01 DIAGNOSIS — Z1329 Encounter for screening for other suspected endocrine disorder: Secondary | ICD-10-CM | POA: Diagnosis not present

## 2018-05-01 DIAGNOSIS — R5383 Other fatigue: Secondary | ICD-10-CM

## 2018-05-01 NOTE — Patient Instructions (Signed)

## 2018-05-04 LAB — TSH: TSH: 1.98 m[IU]/L (ref 0.40–4.50)

## 2018-05-04 LAB — COMPLETE METABOLIC PANEL WITH GFR
AG RATIO: 1.8 (calc) (ref 1.0–2.5)
ALT: 35 U/L (ref 9–46)
AST: 25 U/L (ref 10–35)
Albumin: 4.5 g/dL (ref 3.6–5.1)
Alkaline phosphatase (APISO): 76 U/L (ref 40–115)
BILIRUBIN TOTAL: 0.5 mg/dL (ref 0.2–1.2)
BUN: 17 mg/dL (ref 7–25)
CHLORIDE: 103 mmol/L (ref 98–110)
CO2: 31 mmol/L (ref 20–32)
Calcium: 10.3 mg/dL (ref 8.6–10.3)
Creat: 0.87 mg/dL (ref 0.70–1.25)
GFR, EST AFRICAN AMERICAN: 106 mL/min/{1.73_m2} (ref >=60)
GFR, Est Non African American: 92 mL/min/{1.73_m2} (ref >=60)
Globulin: 2.5 g/dL (calc) (ref 1.9–3.7)
Glucose, Bld: 192 mg/dL — ABNORMAL HIGH (ref 65–99)
POTASSIUM: 5.7 mmol/L — AB (ref 3.5–5.3)
Sodium: 142 mmol/L (ref 135–146)
Total Protein: 7 g/dL (ref 6.1–8.1)

## 2018-05-04 LAB — LIPID PANEL
Cholesterol: 133 mg/dL (ref <200)
HDL: 36 mg/dL — ABNORMAL LOW (ref >40)
LDL Cholesterol (Calc): 74 mg/dL (calc)
NON-HDL CHOLESTEROL (CALC): 97 mg/dL (ref <130)
Total CHOL/HDL Ratio: 3.7 (calc) (ref <5.0)
Triglycerides: 148 mg/dL (ref <150)

## 2018-05-04 LAB — URINALYSIS, ROUTINE W REFLEX MICROSCOPIC
Bacteria, UA: NONE SEEN /HPF
Bilirubin Urine: NEGATIVE
Glucose, UA: NEGATIVE
HYALINE CAST: NONE SEEN /LPF
Hgb urine dipstick: NEGATIVE
Ketones, ur: NEGATIVE
NITRITE: NEGATIVE
PROTEIN: NEGATIVE
RBC / HPF: NONE SEEN /HPF (ref 0–2)
SPECIFIC GRAVITY, URINE: 1.023 (ref 1.001–1.03)
SQUAMOUS EPITHELIAL / LPF: NONE SEEN /HPF (ref <=5)
pH: <=5 (ref 5.0–8.0)

## 2018-05-04 LAB — CBC WITH DIFFERENTIAL/PLATELET
BASOS PCT: 0.7 %
Basophils Absolute: 52 cells/uL (ref 0–200)
EOS PCT: 1.6 %
Eosinophils Absolute: 118 cells/uL (ref 15–500)
HCT: 46.7 % (ref 38.5–50.0)
Hemoglobin: 15.8 g/dL (ref 13.2–17.1)
Lymphs Abs: 2420 cells/uL (ref 850–3900)
MCH: 30.2 pg (ref 27.0–33.0)
MCHC: 33.8 g/dL (ref 32.0–36.0)
MCV: 89.1 fL (ref 80.0–100.0)
MONOS PCT: 13 %
MPV: 10.8 fL (ref 7.5–12.5)
NEUTROS PCT: 52 %
Neutro Abs: 3848 cells/uL (ref 1500–7800)
PLATELETS: 225 10*3/uL (ref 140–400)
RBC: 5.24 10*6/uL (ref 4.20–5.80)
RDW: 12.8 % (ref 11.0–15.0)
TOTAL LYMPHOCYTE: 32.7 %
WBC mixed population: 962 cells/uL — ABNORMAL HIGH (ref 200–950)
WBC: 7.4 10*3/uL (ref 3.8–10.8)

## 2018-05-04 LAB — VITAMIN D 25 HYDROXY (VIT D DEFICIENCY, FRACTURES): Vit D, 25-Hydroxy: 82 ng/mL (ref 30–100)

## 2018-05-04 LAB — MICROALBUMIN / CREATININE URINE RATIO
CREATININE, URINE: 118 mg/dL (ref 20–320)
MICROALB UR: 4.7 mg/dL
MICROALB/CREAT RATIO: 40 ug/mg{creat} — AB (ref <30)

## 2018-05-04 LAB — INSULIN, RANDOM: INSULIN: 22.8 u[IU]/mL — AB (ref 2.0–19.6)

## 2018-05-04 LAB — VITAMIN B12: Vitamin B-12: 490 pg/mL (ref 200–1100)

## 2018-05-04 LAB — MAGNESIUM: Magnesium: 1.8 mg/dL (ref 1.5–2.5)

## 2018-05-04 LAB — HEMOGLOBIN A1C
Hgb A1c MFr Bld: 7.5 % of total Hgb — ABNORMAL HIGH (ref <5.7)
MEAN PLASMA GLUCOSE: 169 (calc)
eAG (mmol/L): 9.3 (calc)

## 2018-05-04 LAB — IRON, TOTAL/TOTAL IRON BINDING CAP
%SAT: 24 % (ref 15–60)
IRON: 97 ug/dL (ref 50–180)
TIBC: 403 ug/dL (ref 250–425)

## 2018-05-04 LAB — TESTOSTERONE: Testosterone: 249 ng/dL — ABNORMAL LOW (ref 250–827)

## 2018-05-04 LAB — PSA: PSA: 0.5 ng/mL (ref <=4.0)

## 2018-06-18 ENCOUNTER — Encounter (HOSPITAL_COMMUNITY): Payer: Self-pay

## 2018-06-18 ENCOUNTER — Emergency Department (HOSPITAL_COMMUNITY)
Admission: EM | Admit: 2018-06-18 | Discharge: 2018-06-18 | Disposition: A | Discharge status: Home / Self Care | Payer: BLUE CROSS/BLUE SHIELD | Attending: Emergency Medicine | Admitting: Emergency Medicine

## 2018-06-18 ENCOUNTER — Emergency Department (HOSPITAL_COMMUNITY): Payer: BLUE CROSS/BLUE SHIELD

## 2018-06-18 ENCOUNTER — Other Ambulatory Visit: Payer: Self-pay

## 2018-06-18 DIAGNOSIS — Z9049 Acquired absence of other specified parts of digestive tract: Secondary | ICD-10-CM | POA: Diagnosis not present

## 2018-06-18 DIAGNOSIS — M5431 Sciatica, right side: Secondary | ICD-10-CM | POA: Diagnosis not present

## 2018-06-18 DIAGNOSIS — E119 Type 2 diabetes mellitus without complications: Secondary | ICD-10-CM | POA: Diagnosis not present

## 2018-06-18 DIAGNOSIS — Z7982 Long term (current) use of aspirin: Secondary | ICD-10-CM | POA: Diagnosis not present

## 2018-06-18 DIAGNOSIS — F17210 Nicotine dependence, cigarettes, uncomplicated: Secondary | ICD-10-CM | POA: Insufficient documentation

## 2018-06-18 DIAGNOSIS — I10 Essential (primary) hypertension: Secondary | ICD-10-CM | POA: Insufficient documentation

## 2018-06-18 DIAGNOSIS — Z96651 Presence of right artificial knee joint: Secondary | ICD-10-CM | POA: Diagnosis not present

## 2018-06-18 DIAGNOSIS — M5441 Lumbago with sciatica, right side: Secondary | ICD-10-CM | POA: Diagnosis not present

## 2018-06-18 DIAGNOSIS — Z79899 Other long term (current) drug therapy: Secondary | ICD-10-CM | POA: Diagnosis not present

## 2018-06-18 DIAGNOSIS — M545 Low back pain: Secondary | ICD-10-CM | POA: Diagnosis not present

## 2018-06-18 DIAGNOSIS — Z7984 Long term (current) use of oral hypoglycemic drugs: Secondary | ICD-10-CM | POA: Diagnosis not present

## 2018-06-18 DIAGNOSIS — M25551 Pain in right hip: Secondary | ICD-10-CM | POA: Diagnosis not present

## 2018-06-18 DIAGNOSIS — M79604 Pain in right leg: Secondary | ICD-10-CM | POA: Diagnosis not present

## 2018-06-18 HISTORY — DX: Disorder of kidney and ureter, unspecified: N28.9

## 2018-06-18 MED ORDER — PREDNISONE 20 MG PO TABS
60.0000 mg | ORAL_TABLET | Freq: Once | ORAL | Status: AC
  Administered 2018-06-18: 60 mg via ORAL
  Filled 2018-06-18: qty 3

## 2018-06-18 MED ORDER — ONDANSETRON 4 MG PO TBDP
4.0000 mg | ORAL_TABLET | Freq: Once | ORAL | Status: AC
  Administered 2018-06-18: 4 mg via ORAL
  Filled 2018-06-18: qty 1

## 2018-06-18 MED ORDER — CYCLOBENZAPRINE HCL 10 MG PO TABS: 10.0000 mg | ORAL_TABLET | Freq: Two times a day (BID) | ORAL | 0 refills | Status: DC | PRN

## 2018-06-18 MED ORDER — PREDNISONE 10 MG PO TABS: 40.0000 mg | ORAL_TABLET | Freq: Every day | ORAL | 0 refills | Status: DC

## 2018-06-18 MED ORDER — HYDROCODONE-ACETAMINOPHEN 5-325 MG PO TABS: 1.0000 | ORAL_TABLET | Freq: Four times a day (QID) | ORAL | 0 refills | Status: DC | PRN

## 2018-06-18 MED ORDER — HYDROMORPHONE HCL 2 MG/ML IJ SOLN
2.0000 mg | Freq: Once | INTRAMUSCULAR | Status: AC
  Administered 2018-06-18: 2 mg via INTRAMUSCULAR
  Filled 2018-06-18: qty 1

## 2018-06-18 NOTE — ED Notes (Signed)
Patient transported to MRI 

## 2018-06-18 NOTE — ED Provider Notes (Signed)
[facility] DEPT Provider Note   CSN: [id number] Arrival date & time: 06/18/18  3267     History   Chief Complaint Chief Complaint  Patient presents with  . Hip Pain  . Fall    HPI Aaron Burns is a 62 y.o. male.  Patient with a 70-month history of some discomfort in the right lower back buttocks area.  Got significantly worse in the last week with radiation into the right buttocks area.  And right thigh.  No numbness or weakness to the right foot.  No incontinence no left leg symptoms.  No history of injury.  No known history of any back problems.     Past Medical History:  Diagnosis Date  . Elevated hemoglobin A1c   . Heart murmur    at birth  . History of kidney stones 2011  . Hypertension   . Morbid obesity (BMI 36. 06/07/2015  . 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 Renal disorder     Patient Active Problem List   Diagnosis Date Noted  . Morbid obesity (BMI 36.31)  06/07/2015  . 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 Medication management 06/10/2014  . Vitamin D deficiency 11/30/2013  . Mixed hyperlipidemia 11/30/2013  . Hypertension   . Type 2 diabetes mellitus ([place])     Past Surgical History:  Procedure Laterality Date  . CHOLECYSTECTOMY  07/27/2012   Procedure: LAPAROSCOPIC CHOLECYSTECTOMY WITH INTRAOPERATIVE CHOLANGIOGRAM;  Surgeon: Marcello Moores A. Cornett, MD;  Location: [facility];  Service: General;  Laterality: N/A;  . CYSTOSCOPY/RETROGRADE/URETEROSCOPY/STONE EXTRACTION WITH BASKET  5/11  . HERNIA REPAIR     as a baby  . KNEE SURGERY Right 11/25/11 and 2015   arthroscopic, cortisone injections done also  . TOTAL KNEE ARTHROPLASTY Right 06/06/2015   Procedure: RIGHT TOTAL KNEE ARTHROPLASTY;  Surgeon: Paralee Cancel, MD;  Location: [facility];  Service: Orthopedics;  Laterality: Right;  . WISDOM TOOTH EXTRACTION  yrs ago        Home Medications    Prior to Admission medications   Medication  Sig Start Date End Date Taking? Authorizing Provider  aspirin 81 MG chewable tablet Chew 81 mg by mouth daily.   Yes [provider]  bisoprolol-hydrochlorothiazide (ZIAC) 5-6.25 MG tablet TAKE ONE TABLET BY MOUTH EVERY MORNING FOR BLOOD PRESSURE 04/14/18  Yes Vicie Mutters, PA-C  Cholecalciferol (VITAMIN D3 MAXIMUM STRENGTH) 5000 UNITS capsule Take 10,000 Units by mouth daily.    Yes [provider]  CINNAMON PO Take 1,000 mg by mouth daily.   Yes [provider]  Magnesium 250 MG TABS Take 1 tablet by mouth at bedtime.   Yes [provider]  metFORMIN (GLUCOPHAGE-XR) 500 MG 24 hr tablet TAKE FOUR TABLETS BY MOUTH DAILY - 1 WITH BREAKFAST AND LUNCH AND 2 WITH SUPPER FOR DIABETES 04/14/18  Yes [name], NP  Multiple Vitamin (MULTIVITAMIN WITH MINERALS) TABS Take 1 tablet by mouth daily.   Yes [provider]  OVER THE COUNTER MEDICATION Take 1 tablet by mouth daily. Balance B50   Yes [provider]  phentermine (ADIPEX-P) 37.5 MG tablet TAKE 1/2 TO 1 TABLET BY MOUTH EVERY MORNING FOR DIETING AND WEIGHT LOSS Patient taking differently: Take 18.75 mg by mouth 2 (two) times daily. TAKE 1/2 tab in the morning and half a tablet at lunch. 04/26/18  Yes Unk Pinto, MD  vitamin C (ASCORBIC ACID) 500 MG tablet Take 1,000 mg by mouth daily.  Yes [provider]  zinc gluconate 50 MG tablet Take 50 mg by mouth daily.   Yes [provider]  cyclobenzaprine (FLEXERIL) 10 MG tablet Take 1 tablet (10 mg total) by mouth 2 (two) times daily as needed for muscle spasms. 06/18/18   Fredia Sorrow, MD  HYDROcodone-acetaminophen (NORCO/VICODIN) 5-325 MG tablet Take 1-2 tablets by mouth every 6 (six) hours as needed for moderate pain. 06/18/18   Fredia Sorrow, MD  predniSONE (DELTASONE) 10 MG tablet Take 4 tablets (40 mg total) by mouth daily. 06/18/18   Fredia Sorrow, MD    Family History Family History  Problem Relation Age of  Onset  . Hypertension Mother   . Diabetes Mother   . Kidney failure Mother   . Diabetes Father   . Diabetes Sister   . Diabetes Brother     Social History Social History   Tobacco Use  . Smoking status: Current Some Day Smoker    Packs/day: 1.00    Years: 30.00    Pack years: 30.00    Types: Cigarettes    Last attempt to quit: 12/16/2013    Years since quitting: 4.5  . Smokeless tobacco: Former Systems developer    Quit date: 08/16/2014  Substance Use Topics  . Alcohol use: Yes    Alcohol/week: 0.0 oz    Comment: occasional  . Drug use: No     Allergies   Ppd [tuberculin purified protein derivative]   Review of Systems Review of Systems  Constitutional: Negative for fever.  HENT: Negative for congestion.   Eyes: Negative for redness.  Respiratory: Negative for shortness of breath.   Cardiovascular: Negative for chest pain.  Gastrointestinal: Negative for abdominal pain, nausea and vomiting.  Genitourinary: Negative for dysuria.  Musculoskeletal: Positive for back pain.  Skin: Negative for rash.  Neurological: Negative for weakness and numbness.  Hematological: Does not bruise/bleed easily.  Psychiatric/Behavioral: Negative for confusion.     Physical Exam Updated Vital Signs BP 109/70 (BP Location: Right Arm)   Pulse 61   Temp 98.3 F (36.8 C) (Oral)   Resp 14   Ht 1.753 m (5\' 9" )   Wt 111.1 kg (245 lb)   SpO2 98%   BMI 36.18 kg/m   Physical Exam  Constitutional: He is oriented to person, place, and time. He appears well-developed and well-nourished. No distress.  HENT:  Head: Normocephalic and atraumatic.  Mouth/Throat: Oropharynx is clear and moist.  Eyes: Pupils are equal, round, and reactive to light. Conjunctivae and EOM are normal.  Neck: Neck supple.  Cardiovascular: Normal rate, regular rhythm and normal heart sounds.  Pulmonary/Chest: Effort normal and breath sounds normal. No respiratory distress.  Abdominal: Soft. Bowel sounds are normal. There is no  tenderness.  Musculoskeletal: Normal range of motion. He exhibits tenderness.  Tenderness to palpation in the right buttocks right waistline area.  Increased pain with range of motion of the right hip.  Distally good cap refill good dorsalis pedis pulse.  Sensation intact strength intact distally.  Neurological: He is alert and oriented to person, place, and time. No cranial nerve deficit or sensory deficit. He exhibits normal muscle tone. Coordination normal.  Nursing note and vitals reviewed.    ED Treatments / Results  Labs (all labs ordered are listed, but only abnormal results are displayed) Labs Reviewed - No data to display  EKG None  Radiology Mr Lumbar Spine Wo Contrast  Result Date: 06/18/2018 CLINICAL DATA:  Right hip and leg pain for 2-3 days. EXAM:  MRI LUMBAR SPINE WITHOUT CONTRAST TECHNIQUE: Multiplanar, multisequence MR imaging of the lumbar spine was performed. No intravenous contrast was administered. COMPARISON:  None. FINDINGS: Segmentation: There are five lumbar type vertebral bodies. The last full intervertebral disc space is labeled L5-S1. Alignment:  Normal Vertebrae: Endplate reactive changes mainly at L4-5 but no bone lesions or fracture. Conus medullaris and cauda equina: Conus extends to the L1 level. Conus and cauda equina appear normal. Paraspinal and other soft tissues: No significant findings. Disc levels: L1-2: Mild facet disease but no disc protrusions, spinal or foraminal stenosis. Mild lateral recess encroachment on the right. L2-3: Broad-based bulging annulus and moderate facet disease with mild bilateral lateral recess encroachment. There is also mild left foraminal narrowing. L3-4: Broad-based bulging degenerated annulus, osteophytic ridging and facet disease with ligamentum flavum thickening all contributing to moderate to moderately severe spinal and bilateral lateral recess stenosis. The lateral system assess is slightly greater on the left. There is also  bilateral foraminal stenosis, left greater than right. L4-5: Degenerate disc disease and facet disease with a broad-based bulging degenerated annulus. This in combination with significant facet disease and ligamentum flavum thickening contributes to moderate spinal stenosis and moderately severe right lateral recess stenosis. Moderate right foraminal and extraforaminal encroachment on the right L4 nerve root. L5-S1: Advanced facet disease but no disc protrusions, spinal or foraminal stenosis. IMPRESSION: 1. 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 2. Mild bilateral lateral recess stenosis and mild left foraminal encroachment at L2-3. Electronically Signed   By: Marijo Sanes M.D.   On: 06/18/2018 12:48   Dg Hip Unilat W Or Wo Pelvis 2-3 Views Right  Result Date: 06/18/2018 CLINICAL DATA:  62 year old male with a history of right back and right hip pain EXAM: DG HIP (WITH OR WITHOUT PELVIS) 2-3V RIGHT COMPARISON:  None. FINDINGS: Bony pelvic ring intact without acute displaced fracture. Bilateral hips projects normally over the acetabula. Degenerative changes associated with the left greater trochanter. Unremarkable appearance the proximal right femur. No focal soft tissue swelling or evidence of joint effusion. IMPRESSION: Negative for acute bony abnormality. Degenerative changes of the bilateral hips. Electronically Signed   By: Corrie Mckusick D.O.   On: 06/18/2018 11:01    Procedures Procedures (including critical care time)  Medications Ordered in ED Medications  HYDROmorphone (DILAUDID) injection 2 mg (2 mg Intramuscular Given 06/18/18 1046)  ondansetron (ZOFRAN-ODT) disintegrating tablet 4 mg (4 mg Oral Given 06/18/18 1046)  predniSONE (DELTASONE) tablet 60 mg (60 mg Oral Given 06/18/18 1351)     Initial Impression / Assessment and Plan / ED Course  I have reviewed the triage vital signs and the nursing notes.  Pertinent labs & imaging results  that were available during my care of the patient were reviewed by me and considered in my medical decision making (see chart for details).     MRI shows significant abnormalities in the lumbar back in particular there is a broad-based disc herniation certainly could explain the patient's sciatic symptoms.  Will start on steroids patient has been followed by orthopedic surgery in the past because he has had a right knee replacement.  He can either follow-up with them or also gave him neurosurgery Dr. Donnella Bi information for follow-up.  We will in addition treat with hydrocodone and Flexeril.  Patient given a work note to be out of work for a week to rest.  Final Clinical Impressions(s) / ED Diagnoses   Final diagnoses:  Sciatica of  right side    ED Discharge Orders        Ordered    predniSONE (DELTASONE) 10 MG tablet  Daily     06/18/18 1408    cyclobenzaprine (FLEXERIL) 10 MG tablet  2 times daily PRN     06/18/18 1408    HYDROcodone-acetaminophen (NORCO/VICODIN) 5-325 MG tablet  Every 6 hours PRN     06/18/18 1408       Fredia Sorrow, MD 06/18/18 1413

## 2018-06-18 NOTE — Discharge Instructions (Signed)
Take the prednisone as directed for the next 5 days.  Take pain medicine as needed.  Take the muscle relaxer as well.  Work note provided to be out of work for a week.  He also can follow-up with your current orthopedic group.  MRI definitely shows some herniated disc this may  eventually lead to the need for surgery.

## 2018-06-18 NOTE — ED Triage Notes (Signed)
Patient c/o right hip pain that radiates down the right buttocks and right thigh x 2-3 days. Patient states he has had pain in his right hip before,but he states that he fell into a hole 2-3 days ago and the pain is worse.

## 2018-06-19 DIAGNOSIS — M5136 Other intervertebral disc degeneration, lumbar region: Secondary | ICD-10-CM | POA: Diagnosis not present

## 2018-06-19 DIAGNOSIS — M5416 Radiculopathy, lumbar region: Secondary | ICD-10-CM | POA: Diagnosis not present

## 2018-06-30 DIAGNOSIS — M5416 Radiculopathy, lumbar region: Secondary | ICD-10-CM | POA: Diagnosis not present

## 2018-07-09 ENCOUNTER — Other Ambulatory Visit: Payer: Self-pay | Admitting: Physician Assistant

## 2018-07-09 ENCOUNTER — Other Ambulatory Visit: Payer: Self-pay | Admitting: Adult Health

## 2018-07-09 DIAGNOSIS — E119 Type 2 diabetes mellitus without complications: Secondary | ICD-10-CM

## 2018-07-13 ENCOUNTER — Other Ambulatory Visit (INDEPENDENT_AMBULATORY_CARE_PROVIDER_SITE_OTHER): Payer: BLUE CROSS/BLUE SHIELD

## 2018-07-13 DIAGNOSIS — Z1211 Encounter for screening for malignant neoplasm of colon: Secondary | ICD-10-CM

## 2018-07-13 LAB — POC HEMOCCULT BLD/STL (HOME/3-CARD/SCREEN)
Card #2 Fecal Occult Blod, POC: NEGATIVE
FECAL OCCULT BLD: NEGATIVE
Fecal Occult Blood, POC: NEGATIVE

## 2018-07-14 DIAGNOSIS — M5416 Radiculopathy, lumbar region: Secondary | ICD-10-CM | POA: Diagnosis not present

## 2018-07-15 DIAGNOSIS — M5416 Radiculopathy, lumbar region: Secondary | ICD-10-CM | POA: Diagnosis not present

## 2018-07-20 DIAGNOSIS — M5416 Radiculopathy, lumbar region: Secondary | ICD-10-CM | POA: Diagnosis not present

## 2018-07-23 DIAGNOSIS — M5416 Radiculopathy, lumbar region: Secondary | ICD-10-CM | POA: Diagnosis not present

## 2018-07-27 DIAGNOSIS — M5416 Radiculopathy, lumbar region: Secondary | ICD-10-CM | POA: Diagnosis not present

## 2018-07-28 DIAGNOSIS — M5416 Radiculopathy, lumbar region: Secondary | ICD-10-CM | POA: Diagnosis not present

## 2018-07-30 DIAGNOSIS — M5416 Radiculopathy, lumbar region: Secondary | ICD-10-CM | POA: Diagnosis not present

## 2018-08-03 DIAGNOSIS — M5416 Radiculopathy, lumbar region: Secondary | ICD-10-CM | POA: Diagnosis not present

## 2018-08-19 NOTE — Progress Notes (Signed)
FOLLOW UP  Assessment and Plan:   Hypertension Well controlled with current medications  Monitor blood pressure at home; patient to call if consistently greater than 130/80 Continue DASH diet.   Reminder to go to the ER if any CP, SOB, nausea, dizziness, severe HA, changes vision/speech, left arm numbness and tingling and jaw pain.  Cholesterol Currently at goal;  Continue low cholesterol diet and exercise.  Check lipid panel.   Diabetes with diabetic chronic kidney disease Continue medication: metformin, cinnamon supplement - increase to 1000 mg BID Continue diet and exercise.  Perform daily foot/skin check, notify office of any concerning changes.  Check A1C  Morbid obesity with co morbidities Long discussion about weight loss, diet, and exercise Recommended diet heavy in fruits and veggies and low in animal meats, cheeses, and dairy products, appropriate calorie intake Discussed ideal weight for height  Patient on phentermine with benefit and no SE, taking drug breaks; continue close follow up. Advised he take a drug break.  Discussed increasing vegetable intake compared to protein/carbs Will follow up in 3 months  Vitamin D Def At goal at last visit; continue supplementation to maintain goal of 70-100 Defer Vit D level  Continue diet and meds as discussed. Further disposition pending results of labs. Discussed med's effects and SE's.   Over 30 minutes of exam, counseling, chart review, and critical decision making was performed.   Future Appointments  Date Time Provider [place]  11/27/2018 10:30 AM Unk Pinto, MD [facility] None  05/28/2019  9:00 AM Unk Pinto, MD [facility] None    ----------------------------------------------------------------------------------------------------------------------  HPI 62 y.o. male  presents for 3 month follow up on hypertension, cholesterol, diabetes, morbid obesity and vitamin D deficiency.   He is under  some stress due to wife recently having mental break and being committed, threw out all his clothing, etc. However he reports daughter have been very helpful and he is doing fairly.   he is prescribed phentermine for weight loss.  While on the medication they have lost 0 lbs since last visit. They deny palpitations, anxiety, trouble sleeping, elevated BP.   BMI is Body mass index is 36.39 kg/m., he is active at work 10 days 4 days a week; he admits diet hasn't been very good recently.  Wt Readings from Last 3 Encounters:  08/21/18 250 lb (113.4 kg)  06/18/18 245 lb (111.1 kg)  05/01/18 251 lb 12.8 oz (114.2 kg)   His blood pressure has been controlled at home, today their BP is BP: 134/84  He does not workout. He denies chest pain, shortness of breath, dizziness.   He is not on cholesterol medication and denies myalgias. His cholesterol is at goal. The cholesterol last visit was:   Lab Results  Component Value Date   CHOL 133 05/01/2018   HDL 36 (L) 05/01/2018   LDLCALC 74 05/01/2018   TRIG 148 05/01/2018   CHOLHDL 3.7 05/01/2018    He has been working on diet and exercise for T2DM on metformin 2000 mg and cinnamon, and denies foot ulcerations, increased appetite, nausea, paresthesia of the feet, polydipsia, polyuria, visual disturbances and vomiting. He has not bee checking blood sugars as his wife threw out his glucometer and supplies. Last A1C in the office was:  Lab Results  Component Value Date   HGBA1C 7.5 (H) 05/01/2018   Patient is on Vitamin D supplement.   Lab Results  Component Value Date   VD25OH 82 05/01/2018        Current  Medications:  Current Outpatient Medications on File Prior to Visit  Medication Sig  . aspirin 81 MG chewable tablet Chew 81 mg by mouth daily.  . bisoprolol-hydrochlorothiazide (ZIAC) 5-6.25 MG tablet TAKE ONE TABLET BY MOUTH EVERY MORNING FOR BLOOD PRESSURE  . Cholecalciferol (VITAMIN D3 MAXIMUM STRENGTH) 5000 UNITS capsule Take 10,000 Units  by mouth daily.   [name] CINNAMON PO Take 1,000 mg by mouth daily.  . Magnesium 250 MG TABS Take 1 tablet by mouth at bedtime.  . metFORMIN (GLUCOPHAGE-XR) 500 MG 24 hr tablet TAKE 1 TABLET WITH BREAKFAST, 1 TABLET WITH LUNCH, AND 2 TABLETS WITH SUPPER FOR DIABETES  . Multiple Vitamin (MULTIVITAMIN WITH MINERALS) TABS Take 1 tablet by mouth daily.  [name] OVER THE COUNTER MEDICATION Take 1 tablet by mouth daily. Balance B50  . phentermine (ADIPEX-P) 37.5 MG tablet TAKE 1/2 TO 1 TABLET BY MOUTH EVERY MORNING FOR DIETING AND WEIGHT LOSS (Patient taking differently: Take 18.75 mg by mouth 2 (two) times daily. TAKE 1/2 tab in the morning and half a tablet at lunch.)  . vitamin C (ASCORBIC ACID) 500 MG tablet Take 1,000 mg by mouth daily.  [name] zinc gluconate 50 MG tablet Take 50 mg by mouth daily.   No current facility-administered medications on file prior to visit.      Allergies:  Allergies  Allergen Reactions  . Ppd [Tuberculin Purified Protein Derivative]     Pt states he is not allergic to this.Marland Kitchenpositive tb test 2014, pt took tb treatment 2014     Medical History:  Past Medical History:  Diagnosis Date  . Elevated hemoglobin A1c   . Heart murmur    at birth  . History of kidney stones 2011  . Hypertension   . Morbid obesity (BMI 36. 06/07/2015  . 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 Renal disorder    Family history- Reviewed and unchanged Social history- Reviewed and unchanged   Review of Systems:  Review of Systems  Constitutional: Negative for malaise/fatigue and weight loss.  HENT: Negative for hearing loss and tinnitus.   Eyes: Negative for blurred vision and double vision.  Respiratory: Negative for cough, shortness of breath and wheezing.   Cardiovascular: Negative for chest pain, palpitations, orthopnea, claudication and leg swelling.  Gastrointestinal: Negative for abdominal pain, blood in stool, constipation, diarrhea,  heartburn, melena, nausea and vomiting.  Genitourinary: Negative.   Musculoskeletal: Negative for joint pain and myalgias.  Skin: Negative for rash.  Neurological: Negative for dizziness, tingling, sensory change, weakness and headaches.  Endo/Heme/Allergies: Negative for polydipsia.  Psychiatric/Behavioral: Negative.   All other systems reviewed and are negative.     Physical Exam: BP 134/84   Pulse 78   Temp 97.9 F (36.6 C)   Ht 5' 9.5" (1.765 m)   Wt 250 lb (113.4 kg)   SpO2 94%   BMI 36.39 kg/m  Wt Readings from Last 3 Encounters:  08/21/18 250 lb (113.4 kg)  06/18/18 245 lb (111.1 kg)  05/01/18 251 lb 12.8 oz (114.2 kg)   General Appearance: Well nourished, in no apparent distress. Eyes: PERRLA, EOMs, conjunctiva no swelling or erythema Sinuses: No Frontal/maxillary tenderness ENT/Mouth: Ext aud canals clear, TMs without erythema, bulging. No erythema, swelling, or exudate on post pharynx.  Tonsils not swollen or erythematous. Hearing normal.  Neck: Supple, thyroid normal.  Respiratory: Respiratory effort normal, BS equal bilaterally without rales, rhonchi, wheezing or stridor.  Cardio: RRR with no MRGs. Brisk  peripheral pulses without edema.  Abdomen: Soft, + BS.  Non tender, no guarding, rebound, hernias, masses. Lymphatics: Non tender without lymphadenopathy.  Musculoskeletal: Full ROM, 5/5 strength, Normal gait Skin: Warm, dry without rashes, lesions, ecchymosis.  Neuro: Cranial nerves intact. No cerebellar symptoms.  Psych: Awake and oriented X 3, normal affect, Insight and Judgment appropriate.    Izora Ribas, NP 9:21 AM [facility]

## 2018-08-21 ENCOUNTER — Ambulatory Visit: Payer: BLUE CROSS/BLUE SHIELD | Admitting: Adult Health

## 2018-08-21 ENCOUNTER — Encounter: Payer: Self-pay | Admitting: Adult Health

## 2018-08-21 VITALS — BP 134/84 | HR 78 | Temp 97.9°F | Ht 69.5 in | Wt 250.0 lb

## 2018-08-21 DIAGNOSIS — E559 Vitamin D deficiency, unspecified: Secondary | ICD-10-CM | POA: Diagnosis not present

## 2018-08-21 DIAGNOSIS — Z79899 Other long term (current) drug therapy: Secondary | ICD-10-CM | POA: Diagnosis not present

## 2018-08-21 DIAGNOSIS — E1122 Type 2 diabetes mellitus with diabetic chronic kidney disease: Secondary | ICD-10-CM

## 2018-08-21 DIAGNOSIS — I10 Essential (primary) hypertension: Secondary | ICD-10-CM | POA: Diagnosis not present

## 2018-08-21 DIAGNOSIS — N181 Chronic kidney disease, stage 1: Secondary | ICD-10-CM

## 2018-08-21 DIAGNOSIS — E782 Mixed hyperlipidemia: Secondary | ICD-10-CM | POA: Diagnosis not present

## 2018-08-21 NOTE — Patient Instructions (Signed)
Goals    . Blood Pressure < 130/80    . HEMOGLOBIN A1C < 7.0    . LDL CALC < 70    . Weight (lb) < 240 lb (108.9 kg)         Drink 1/2 your body weight in fluid ounces of water daily; drink a tall glass of water 30 min before meals  Don't eat until you're stuffed- listen to your stomach and eat until you are 80% full   Try eating off of a salad plate; wait 10 min after finishing before going back for seconds  Start by eating the vegetables on your plate; aim for 50% of your meals to be fruits or vegetables  Then eat your protein - lean meats (grass fed if possible), fish, beans, nuts in moderation  Eat your carbs/starch last ONLY if you still are hungry. If you can, stop before finishing it all  Avoid sugar and flour - the closer it looks to it's original form in nature, typically the better it is for you  Splurge in moderation - "assign" days when you get to splurge and have the "bad stuff" - I like to follow a 80% - 20% plan- "good" choices 80 % of the time, "bad" choices in moderation 20% of the time  Simple equation is: Calories out > calories in = weight loss - even if you eat the bad stuff, if you limit portions, you will still lose weight      When it comes to diets, agreement about the perfect plan isn't easy to find, even among the experts. Experts at the [employer] developed an idea known as the Healthy Eating Plate. Just imagine a plate divided into logical, healthy portions.  The emphasis is on diet quality:  Load up on vegetables and fruits - one-half of your plate: Aim for color and variety, and remember that potatoes don't count.  Go for whole grains - one-quarter of your plate: Whole wheat, barley, wheat berries, quinoa, oats, brown rice, and foods made with them. If you want pasta, go with whole wheat pasta.  Protein power - one-quarter of your plate: Fish, chicken, beans, and nuts are all healthy, versatile protein sources. Limit red  meat.  The diet, however, does go beyond the plate, offering a few other suggestions.  Use healthy plant oils, such as olive, canola, soy, corn, sunflower and peanut. Check the labels, and avoid partially hydrogenated oil, which have unhealthy trans fats.  If you're thirsty, drink water. Coffee and tea are good in moderation, but skip sugary drinks and limit milk and dairy products to one or two daily servings.  The type of carbohydrate in the diet is more important than the amount. Some sources of carbohydrates, such as vegetables, fruits, whole grains, and beans-are healthier than others.  Finally, stay active.

## 2018-08-22 LAB — CBC WITH DIFFERENTIAL/PLATELET
BASOS PCT: 0.9 %
Basophils Absolute: 59 cells/uL (ref 0–200)
EOS PCT: 1.8 %
Eosinophils Absolute: 117 cells/uL (ref 15–500)
HEMATOCRIT: 48.2 % (ref 38.5–50.0)
Hemoglobin: 16.1 g/dL (ref 13.2–17.1)
Lymphs Abs: 1892 cells/uL (ref 850–3900)
MCH: 29.9 pg (ref 27.0–33.0)
MCHC: 33.4 g/dL (ref 32.0–36.0)
MCV: 89.4 fL (ref 80.0–100.0)
MPV: 11.1 fL (ref 7.5–12.5)
Monocytes Relative: 11.2 %
NEUTROS ABS: 3705 {cells}/uL (ref 1500–7800)
NEUTROS PCT: 57 %
PLATELETS: 215 10*3/uL (ref 140–400)
RBC: 5.39 10*6/uL (ref 4.20–5.80)
RDW: 12.8 % (ref 11.0–15.0)
Total Lymphocyte: 29.1 %
WBC mixed population: 728 cells/uL (ref 200–950)
WBC: 6.5 10*3/uL (ref 3.8–10.8)

## 2018-08-22 LAB — COMPLETE METABOLIC PANEL WITH GFR
AG RATIO: 1.8 (calc) (ref 1.0–2.5)
ALT: 38 U/L (ref 9–46)
AST: 27 U/L (ref 10–35)
Albumin: 4.4 g/dL (ref 3.6–5.1)
Alkaline phosphatase (APISO): 74 U/L (ref 40–115)
BUN: 22 mg/dL (ref 7–25)
CALCIUM: 10.3 mg/dL (ref 8.6–10.3)
CHLORIDE: 103 mmol/L (ref 98–110)
CO2: 30 mmol/L (ref 20–32)
CREATININE: 0.9 mg/dL (ref 0.70–1.25)
GFR, EST AFRICAN AMERICAN: 105 mL/min/{1.73_m2} (ref >=60)
GFR, EST NON AFRICAN AMERICAN: 91 mL/min/{1.73_m2} (ref >=60)
GLOBULIN: 2.5 g/dL (ref 1.9–3.7)
Glucose, Bld: 284 mg/dL — ABNORMAL HIGH (ref 65–99)
POTASSIUM: 4.9 mmol/L (ref 3.5–5.3)
SODIUM: 141 mmol/L (ref 135–146)
TOTAL PROTEIN: 6.9 g/dL (ref 6.1–8.1)
Total Bilirubin: 0.6 mg/dL (ref 0.2–1.2)

## 2018-08-22 LAB — LIPID PANEL
CHOL/HDL RATIO: 4.4 (calc) (ref <5.0)
Cholesterol: 135 mg/dL (ref <200)
HDL: 31 mg/dL — AB (ref >40)
LDL Cholesterol (Calc): 72 mg/dL (calc)
NON-HDL CHOLESTEROL (CALC): 104 mg/dL (ref <130)
Triglycerides: 234 mg/dL — ABNORMAL HIGH (ref <150)

## 2018-08-22 LAB — MAGNESIUM: Magnesium: 1.7 mg/dL (ref 1.5–2.5)

## 2018-08-22 LAB — TSH: TSH: 2.02 mIU/L (ref 0.40–4.50)

## 2018-08-22 LAB — HEMOGLOBIN A1C
HEMOGLOBIN A1C: 7.8 %{Hb} — AB (ref <5.7)
MEAN PLASMA GLUCOSE: 177 (calc)
eAG (mmol/L): 9.8 (calc)

## 2018-10-04 ENCOUNTER — Other Ambulatory Visit: Payer: Self-pay | Admitting: Adult Health

## 2018-11-08 ENCOUNTER — Other Ambulatory Visit: Payer: Self-pay | Admitting: Internal Medicine

## 2018-11-26 ENCOUNTER — Encounter: Payer: Self-pay | Admitting: Internal Medicine

## 2018-11-26 DIAGNOSIS — Z87891 Personal history of nicotine dependence: Secondary | ICD-10-CM | POA: Insufficient documentation

## 2018-11-26 DIAGNOSIS — F17200 Nicotine dependence, unspecified, uncomplicated: Secondary | ICD-10-CM | POA: Insufficient documentation

## 2018-11-26 DIAGNOSIS — Z8249 Family history of ischemic heart disease and other diseases of the circulatory system: Secondary | ICD-10-CM | POA: Insufficient documentation

## 2018-11-26 NOTE — Progress Notes (Signed)
This very nice 62 y.o. MWM presents for 6 month follow up with HTN, HLD, T2_DM and Vitamin D Deficiency.      Patient is treated for HTN (2008) & BP has been controlled at home. Today's BP is at goal: 128/82. In 2008 , he had a negative Stress Myoview.  Patient has had no complaints of any cardiac type chest pain, palpitations, dyspnea / orthopnea / PND, dizziness, claudication, or dependent edema.     Hyperlipidemia is controlled with diet & meds. Patient denies myalgias or other med SE's. Last Lipids were at goal albeit elevated Trig's: Lab Results  Component Value Date   CHOL 135 08/21/2018   HDL 31 (L) 08/21/2018   LDLCALC 72 08/21/2018   TRIG 234 (H) 08/21/2018   CHOLHDL 4.4 08/21/2018      Also, the patient has history of Morbid Obesity (BMI 36+) and  PreDiabetes (2009) and then later  T2_NIDDM (A1c 6.7%  / Dec 2016) and has had no symptoms of reactive hypoglycemia, diabetic polys, paresthesias or visual blurring.  Last A1c was not at goal: Lab Results  Component Value Date   HGBA1C 7.8 (H) 08/21/2018      Further, the patient also has history of Vitamin D Deficiency ("35" / 2008)  and supplements vitamin D without any suspected side-effects. Last vitamin D was at goal:  Lab Results  Component Value Date   VD25OH 82 05/01/2018   Current Outpatient Medications on File Prior to Visit  Medication Sig  . aspirin 81 MG chewable tablet Chew 81 mg by mouth daily.  . bisoprolol-hydrochlorothiazide (ZIAC) 5-6.25 MG tablet TAKE ONE TABLET BY MOUTH EVERY MORNING FOR BLOOD PRESSURE  . Cholecalciferol (VITAMIN D3 MAXIMUM STRENGTH) 5000 UNITS capsule Take 10,000 Units by mouth daily.   [name] CINNAMON PO Take 1,000 mg by mouth daily.  . Magnesium 250 MG TABS Take 1 tablet by mouth at bedtime.  . metFORMIN (GLUCOPHAGE-XR) 500 MG 24 hr tablet TAKE 1 TABLET WITH BREAKFAST, 1 TABLET WITH LUNCH, AND 2 TABLETS WITH SUPPER FOR DIABETES  . Multiple Vitamin (MULTIVITAMIN WITH MINERALS) TABS Take 1  tablet by mouth daily.  [name] OVER THE COUNTER MEDICATION Take 1 tablet by mouth daily. Balance B50  . phentermine (ADIPEX-P) 37.5 MG tablet TAKE 1/2 TO 1 TABLET EVERY MORNING FOR DIETING AND WEIGHT LOSS  . vitamin C (ASCORBIC ACID) 500 MG tablet Take 1,000 mg by mouth daily.  [name] zinc gluconate 50 MG tablet Take 50 mg by mouth daily.   No current facility-administered medications on file prior to visit.    Allergies  Allergen Reactions  . Ppd [Tuberculin Purified Protein Derivative]     Pt states he is not allergic to this.Marland Kitchenpositive tb test 2014, pt took tb treatment 2014   PMHx:   Past Medical History:  Diagnosis Date  . Elevated hemoglobin A1c   . Heart murmur    at birth  . History of kidney stones 2011  . Hypertension   . Morbid obesity (BMI 36. 06/07/2015  . 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 Renal disorder    Immunization History  Administered Date(s) Administered  . Influenza Inj Mdck Quad With Preservative 10/10/2017  . Influenza Split 12/01/2013  . Influenza-Unspecified 10/02/2018  . Pneumococcal Polysaccharide-23 10/10/2017  . Td 12/17/2003  . Tdap 02/14/2015   Past Surgical History:  Procedure Laterality Date  . CHOLECYSTECTOMY  07/27/2012   Procedure: LAPAROSCOPIC CHOLECYSTECTOMY WITH  INTRAOPERATIVE CHOLANGIOGRAM;  Surgeon: Joyice Faster. Cornett, MD;  Location: [place];  Service: General;  Laterality: N/A;  . CYSTOSCOPY/RETROGRADE/URETEROSCOPY/STONE EXTRACTION WITH BASKET  5/11  . HERNIA REPAIR     as a baby  . KNEE SURGERY Right 11/25/11 and 2015   arthroscopic, cortisone injections done also  . TOTAL KNEE ARTHROPLASTY Right 06/06/2015   Procedure: RIGHT TOTAL KNEE ARTHROPLASTY;  Surgeon: Paralee Cancel, MD;  Location: [facility];  Service: Orthopedics;  Laterality: Right;  . WISDOM TOOTH EXTRACTION  yrs ago   FHx:    Reviewed / unchanged  SHx:    Reviewed / unchanged   Systems Review:  Constitutional: Denies fever,  chills, wt changes, headaches, insomnia, fatigue, night sweats, change in appetite. Eyes: Denies redness, blurred vision, diplopia, discharge, itchy, watery eyes.  ENT: Denies discharge, congestion, post nasal drip, epistaxis, sore throat, earache, hearing loss, dental pain, tinnitus, vertigo, sinus pain, snoring.  CV: Denies chest pain, palpitations, irregular heartbeat, syncope, dyspnea, diaphoresis, orthopnea, PND, claudication or edema. Respiratory: denies cough, dyspnea, DOE, pleurisy, hoarseness, laryngitis, wheezing.  Gastrointestinal: Denies dysphagia, odynophagia, heartburn, reflux, water brash, abdominal pain or cramps, nausea, vomiting, bloating, diarrhea, constipation, hematemesis, melena, hematochezia  or hemorrhoids. Genitourinary: Denies dysuria, frequency, urgency, nocturia, hesitancy, discharge, hematuria or flank pain. Musculoskeletal: Denies arthralgias, myalgias, stiffness, jt. swelling, pain, limping or strain/sprain.  Skin: Denies pruritus, rash, hives, warts, acne, eczema or change in skin lesion(s). Neuro: No weakness, tremor, incoordination, spasms, paresthesia or pain. Psychiatric: Denies confusion, memory loss or sensory loss. Endo: Denies change in weight, skin or hair change.  Heme/Lymph: No excessive bleeding, bruising or enlarged lymph nodes.  Physical Exam  BP 128/82   Pulse 72   Temp 97.7 F (36.5 C)   Resp 16   Ht 5' 9.5" (1.765 m)   Wt 245 lb 9.6 oz (111.4 kg)   BMI 35.75 kg/m   Appears  over nourished, well groomed  and in no distress.  Eyes: PERRLA, EOMs, conjunctiva no swelling or erythema. Sinuses: No frontal/maxillary tenderness ENT/Mouth: EAC's clear, TM's nl w/o erythema, bulging. Nares clear w/o erythema, swelling, exudates. Oropharynx clear without erythema or exudates. Oral hygiene is good. Tongue normal, non obstructing. Hearing intact.  Neck: Supple. Thyroid not palpable. Car 2+/2+ without bruits, nodes or JVD. Chest: Respirations nl with  BS clear & equal w/o rales, rhonchi, wheezing or stridor.  Cor: Heart sounds normal w/ regular rate and rhythm without sig. murmurs, gallops, clicks or rubs. Peripheral pulses normal and equal  without edema.  Abdomen: Soft & bowel sounds normal. Non-tender w/o guarding, rebound, hernias, masses or organomegaly.  Lymphatics: Unremarkable.  Musculoskeletal: Full ROM all peripheral extremities, joint stability, 5/5 strength and normal gait.  Skin: Warm, dry without exposed rashes, lesions or ecchymosis apparent.  Neuro: Cranial nerves intact, reflexes equal bilaterally. Sensory-motor testing grossly intact. Tendon reflexes grossly intact.  Pysch: Alert & oriented x 3.  Insight and judgement nl & appropriate. No ideations.  Assessment and Plan:  1. Essential hypertension  - Continue medication, monitor blood pressure at home.  - Continue DASH diet.  Reminder to go to the ER if any CP,  SOB, nausea, dizziness, severe HA, changes vision/speech.  - CBC with Differential/Platelet - COMPLETE METABOLIC PANEL WITH GFR - Magnesium - TSH  2. Hyperlipemia, mixed  - Continue diet/meds, exercise,& lifestyle modifications.  - Continue monitor periodic cholesterol/liver & renal functions   - Lipid panel - TSH  3. Type 2 diabetes mellitus with stage 1 chronic kidney disease, without  long-term current use of insulin (HCC)  - Continue diet, exercise,  - lifestyle modifications.  - Monitor appropriate labs.  - Hemoglobin A1c - Insulin, random  4. Vitamin D deficiency  - Continue supplementation.  - VITAMIN D 25 Hydroxyl  5. Morbid obesity (BMI 36+)   - Lipid panel - Hemoglobin A1c  6. Medication management  - CBC with Differential/Platelet - COMPLETE METABOLIC PANEL WITH GFR - Magnesium - Lipid panel - TSH - Hemoglobin A1c - Insulin, random - VITAMIN D 25 Hydroxyl        Discussed  regular exercise, BP monitoring, weight control to achieve/maintain BMI less than 25 and  discussed med and SE's. Recommended labs to assess and monitor clinical status with further disposition pending results of labs. Over 30 minutes of exam, counseling, chart review was performed.

## 2018-11-26 NOTE — Patient Instructions (Signed)

## 2018-11-27 ENCOUNTER — Ambulatory Visit (INDEPENDENT_AMBULATORY_CARE_PROVIDER_SITE_OTHER): Payer: BLUE CROSS/BLUE SHIELD | Admitting: Internal Medicine

## 2018-11-27 VITALS — BP 128/82 | HR 72 | Temp 97.7°F | Resp 16 | Ht 69.5 in | Wt 245.6 lb

## 2018-11-27 DIAGNOSIS — Z79899 Other long term (current) drug therapy: Secondary | ICD-10-CM

## 2018-11-27 DIAGNOSIS — E1122 Type 2 diabetes mellitus with diabetic chronic kidney disease: Secondary | ICD-10-CM | POA: Diagnosis not present

## 2018-11-27 DIAGNOSIS — I10 Essential (primary) hypertension: Secondary | ICD-10-CM

## 2018-11-27 DIAGNOSIS — N181 Chronic kidney disease, stage 1: Secondary | ICD-10-CM

## 2018-11-27 DIAGNOSIS — E782 Mixed hyperlipidemia: Secondary | ICD-10-CM

## 2018-11-27 DIAGNOSIS — E559 Vitamin D deficiency, unspecified: Secondary | ICD-10-CM

## 2018-11-30 LAB — COMPLETE METABOLIC PANEL WITH GFR
AG RATIO: 1.9 (calc) (ref 1.0–2.5)
ALT: 34 U/L (ref 9–46)
AST: 19 U/L (ref 10–35)
Albumin: 4.5 g/dL (ref 3.6–5.1)
Alkaline phosphatase (APISO): 63 U/L (ref 40–115)
BUN: 21 mg/dL (ref 7–25)
CALCIUM: 10.5 mg/dL — AB (ref 8.6–10.3)
CO2: 32 mmol/L (ref 20–32)
Chloride: 101 mmol/L (ref 98–110)
Creat: 1 mg/dL (ref 0.70–1.25)
GFR, EST AFRICAN AMERICAN: 92 mL/min/{1.73_m2} (ref >=60)
GFR, EST NON AFRICAN AMERICAN: 80 mL/min/{1.73_m2} (ref >=60)
Globulin: 2.4 g/dL (calc) (ref 1.9–3.7)
Glucose, Bld: 257 mg/dL — ABNORMAL HIGH (ref 65–99)
POTASSIUM: 5.1 mmol/L (ref 3.5–5.3)
Sodium: 141 mmol/L (ref 135–146)
TOTAL PROTEIN: 6.9 g/dL (ref 6.1–8.1)
Total Bilirubin: 0.3 mg/dL (ref 0.2–1.2)

## 2018-11-30 LAB — CBC WITH DIFFERENTIAL/PLATELET
BASOS PCT: 0.7 %
Basophils Absolute: 53 cells/uL (ref 0–200)
Eosinophils Absolute: 122 cells/uL (ref 15–500)
Eosinophils Relative: 1.6 %
HEMATOCRIT: 48.1 % (ref 38.5–50.0)
Hemoglobin: 16.3 g/dL (ref 13.2–17.1)
LYMPHS ABS: 2257 {cells}/uL (ref 850–3900)
MCH: 30.9 pg (ref 27.0–33.0)
MCHC: 33.9 g/dL (ref 32.0–36.0)
MCV: 91.3 fL (ref 80.0–100.0)
MPV: 10.8 fL (ref 7.5–12.5)
Monocytes Relative: 8.7 %
NEUTROS ABS: 4507 {cells}/uL (ref 1500–7800)
Neutrophils Relative %: 59.3 %
PLATELETS: 238 10*3/uL (ref 140–400)
RBC: 5.27 10*6/uL (ref 4.20–5.80)
RDW: 12.7 % (ref 11.0–15.0)
TOTAL LYMPHOCYTE: 29.7 %
WBC: 7.6 10*3/uL (ref 3.8–10.8)
WBCMIX: 661 {cells}/uL (ref 200–950)

## 2018-11-30 LAB — LIPID PANEL
CHOL/HDL RATIO: 4.4 (calc) (ref <5.0)
Cholesterol: 137 mg/dL (ref <200)
HDL: 31 mg/dL — ABNORMAL LOW (ref >40)
LDL Cholesterol (Calc): 70 mg/dL (calc)
NON-HDL CHOLESTEROL (CALC): 106 mg/dL (ref <130)
TRIGLYCERIDES: 271 mg/dL — AB (ref <150)

## 2018-11-30 LAB — TSH: TSH: 1.64 mIU/L (ref 0.40–4.50)

## 2018-11-30 LAB — HEMOGLOBIN A1C
EAG (MMOL/L): 8.5 (calc)
Hgb A1c MFr Bld: 7 % of total Hgb — ABNORMAL HIGH (ref <5.7)
MEAN PLASMA GLUCOSE: 154 (calc)

## 2018-11-30 LAB — VITAMIN D 25 HYDROXY (VIT D DEFICIENCY, FRACTURES): VIT D 25 HYDROXY: 85 ng/mL (ref 30–100)

## 2018-11-30 LAB — MAGNESIUM: Magnesium: 1.7 mg/dL (ref 1.5–2.5)

## 2018-11-30 LAB — INSULIN, RANDOM: Insulin: 102.3 u[IU]/mL — ABNORMAL HIGH (ref 2.0–19.6)

## 2018-12-18 DIAGNOSIS — K640 First degree hemorrhoids: Secondary | ICD-10-CM | POA: Diagnosis not present

## 2018-12-18 DIAGNOSIS — Z1211 Encounter for screening for malignant neoplasm of colon: Secondary | ICD-10-CM | POA: Diagnosis not present

## 2018-12-18 DIAGNOSIS — K621 Rectal polyp: Secondary | ICD-10-CM | POA: Diagnosis not present

## 2018-12-18 LAB — HM COLONOSCOPY

## 2018-12-22 DIAGNOSIS — Z1211 Encounter for screening for malignant neoplasm of colon: Secondary | ICD-10-CM | POA: Diagnosis not present

## 2018-12-22 DIAGNOSIS — K621 Rectal polyp: Secondary | ICD-10-CM | POA: Diagnosis not present

## 2018-12-24 DIAGNOSIS — M5416 Radiculopathy, lumbar region: Secondary | ICD-10-CM | POA: Diagnosis not present

## 2019-01-21 DIAGNOSIS — M5416 Radiculopathy, lumbar region: Secondary | ICD-10-CM | POA: Diagnosis not present

## 2019-01-29 DIAGNOSIS — M5416 Radiculopathy, lumbar region: Secondary | ICD-10-CM | POA: Diagnosis not present

## 2019-02-03 ENCOUNTER — Encounter: Payer: Self-pay | Admitting: *Deleted

## 2019-02-03 DIAGNOSIS — M5416 Radiculopathy, lumbar region: Secondary | ICD-10-CM | POA: Diagnosis not present

## 2019-02-05 DIAGNOSIS — M5416 Radiculopathy, lumbar region: Secondary | ICD-10-CM | POA: Diagnosis not present

## 2019-02-08 DIAGNOSIS — M5416 Radiculopathy, lumbar region: Secondary | ICD-10-CM | POA: Diagnosis not present

## 2019-02-12 DIAGNOSIS — M5416 Radiculopathy, lumbar region: Secondary | ICD-10-CM | POA: Diagnosis not present

## 2019-02-17 DIAGNOSIS — M5416 Radiculopathy, lumbar region: Secondary | ICD-10-CM | POA: Diagnosis not present

## 2019-02-19 DIAGNOSIS — M5416 Radiculopathy, lumbar region: Secondary | ICD-10-CM | POA: Diagnosis not present

## 2019-02-24 DIAGNOSIS — M5416 Radiculopathy, lumbar region: Secondary | ICD-10-CM | POA: Diagnosis not present

## 2019-02-26 DIAGNOSIS — M5416 Radiculopathy, lumbar region: Secondary | ICD-10-CM | POA: Diagnosis not present

## 2019-03-01 DIAGNOSIS — M5416 Radiculopathy, lumbar region: Secondary | ICD-10-CM | POA: Diagnosis not present

## 2019-03-02 ENCOUNTER — Other Ambulatory Visit: Payer: Self-pay | Admitting: Adult Health

## 2019-03-02 DIAGNOSIS — E119 Type 2 diabetes mellitus without complications: Secondary | ICD-10-CM

## 2019-03-08 DIAGNOSIS — M5416 Radiculopathy, lumbar region: Secondary | ICD-10-CM | POA: Diagnosis not present

## 2019-03-12 ENCOUNTER — Ambulatory Visit: Payer: Self-pay | Admitting: Physician Assistant

## 2019-03-12 ENCOUNTER — Ambulatory Visit: Payer: Self-pay | Admitting: Adult Health Nurse Practitioner

## 2019-03-31 ENCOUNTER — Other Ambulatory Visit: Payer: Self-pay | Admitting: Internal Medicine

## 2019-04-09 ENCOUNTER — Ambulatory Visit: Payer: Self-pay | Admitting: Adult Health Nurse Practitioner

## 2019-05-28 ENCOUNTER — Encounter: Payer: Self-pay | Admitting: Internal Medicine

## 2019-06-15 ENCOUNTER — Other Ambulatory Visit: Payer: Self-pay | Admitting: Internal Medicine

## 2019-06-15 MED ORDER — PHENTERMINE HCL 37.5 MG PO TABS: ORAL_TABLET | ORAL | 1 refills | Status: DC

## 2019-06-25 ENCOUNTER — Encounter: Payer: Self-pay | Admitting: Internal Medicine

## 2019-06-25 DIAGNOSIS — M5416 Radiculopathy, lumbar region: Secondary | ICD-10-CM | POA: Diagnosis not present

## 2019-06-25 DIAGNOSIS — M545 Low back pain: Secondary | ICD-10-CM | POA: Diagnosis not present

## 2019-07-05 DIAGNOSIS — M5416 Radiculopathy, lumbar region: Secondary | ICD-10-CM | POA: Diagnosis not present

## 2019-07-12 ENCOUNTER — Encounter: Payer: BLUE CROSS/BLUE SHIELD | Admitting: Physician Assistant

## 2019-07-12 DIAGNOSIS — M5416 Radiculopathy, lumbar region: Secondary | ICD-10-CM | POA: Diagnosis not present

## 2019-07-13 NOTE — Progress Notes (Signed)
[facility]   FOLLOW UP  Assessment and Plan:  Surgical Clearance: We will send letter to the surgeon for surgical clearance.   Recommendations:  DVT prophylaxis, monitoring of blood sugars and blood pressure post operatively. A cardiac clearance is not recommended. Patient will schedule an appointment in the office post operatively for follow up.    Encounter for general adult medical examination with abnormal findings 1 year  Essential hypertension - continue medications, DASH diet, exercise and monitor at home. Call if greater than 130/80.  -     CBC with Differential/Platelet -     COMPLETE METABOLIC PANEL WITH GFR -     TSH -     Urinalysis, Routine w reflex microscopic -     Microalbumin / creatinine urine ratio -     EKG 12-Lead  Type 2 diabetes mellitus with stage 1 chronic kidney disease, without long-term current use of insulin (HCC) -     Hemoglobin A1c  Mixed hyperlipidemia -     Lipid panel  Medication management -     Magnesium  Vitamin D deficiency -     VITAMIN D 25 Hydroxy (Vit-D Deficiency, Fractures)  Morbid obesity (BMI 36.31)  -     Testosterone  Smoker -     DG Chest 2 View; Future  Screening, anemia, deficiency, iron -     Iron,Total/Total Iron Binding Cap -     Ferritin -     Vitamin B12  Screening PSA (prostate specific antigen) -     PSA   Continue diet and meds as discussed. Further disposition pending results of labs. Discussed med's effects and SE's.   Over 40 minutes of exam, counseling, chart review, and critical decision making was performed.   Future Appointments  Date Time Provider [place]  07/17/2020  3:00 PM Vicie Mutters, PA-C [facility] None  07/20/2020  3:00 PM Vicie Mutters, PA-C [facility] None    ----------------------------------------------------------------------------------------------------------------------  HPI 63 y.o. male  presents for 3 month follow up on hypertension, cholesterol,  diabetes, morbid obesity and vitamin D deficiency.   Smoker x age 71, smoked 39 years, less than 1/2 pack a day. Last CXR 2017   He is under some stress due to wife having mental break and being committed, has been hard on him, states they are getting a long but feels like friends and not husband and wife. His daughter is very supportive.   Patient has had chronic back pain, went to ER on 4th July, has had injection in the past but states more recently it has not helped.He continues to have pain but states not weakness/numbness, he is having a L3-L5 anterior/posterior spinal fusion with Dr. Rolena Infante. He is a smoker, DM is well controlled last visit will check A1C today.  No SOB, CP with exertion, played all walking 9 hole golf.    he is prescribed phentermine for weight loss. He takes it occ. They deny palpitations, anxiety, trouble sleeping, elevated BP.   BMI is Body mass index is 35.08 kg/m., he is active at work 10 days 4 days a week; he admits diet hasn't been very good recently.  Wt Readings from Last 3 Encounters:  07/15/19 241 lb (109.3 kg)  11/27/18 245 lb 9.6 oz (111.4 kg)  08/21/18 250 lb (113.4 kg)   His blood pressure has been controlled at home, DID NOT take his BP meds today, today their BP is BP: (!) 142/86  He does not workout. He denies chest pain, shortness  of breath, dizziness.   He is not on cholesterol medication and denies myalgias. His cholesterol is at goal, NOT on medications. . The cholesterol last visit was:   Lab Results  Component Value Date   CHOL 137 11/27/2018   HDL 31 (L) 11/27/2018   LDLCALC 70 11/27/2018   TRIG 271 (H) 11/27/2018   CHOLHDL 4.4 11/27/2018    He has been working on diet and exercise for T2DM With CKD stage 2  on metformin 2000 mg and cinnamon and denies foot ulcerations, increased appetite, nausea, paresthesia of the feet, polydipsia, polyuria, visual disturbances and vomiting.  Meter: no meter Eye exam: 2 years ago, needs Last A1C  in the office was:  Lab Results  Component Value Date   HGBA1C 7.0 (H) 11/27/2018   Lab Results  Component Value Date   GFRNONAA 80 11/27/2018   Patient is on Vitamin D supplement.   Lab Results  Component Value Date   VD25OH 57 11/27/2018        Current Medications:  Current Outpatient Medications on File Prior to Visit  Medication Sig  . aspirin 81 MG chewable tablet Chew 81 mg by mouth daily.  . bisoprolol-hydrochlorothiazide (ZIAC) 5-6.25 MG tablet TAKE ONE TABLET BY MOUTH EVERY MORNING FOR BLOOD PRESSURE  . Cholecalciferol (VITAMIN D3 MAXIMUM STRENGTH) 5000 UNITS capsule Take 10,000 Units by mouth daily.   [name] CINNAMON PO Take 1,000 mg by mouth daily.  . Magnesium 250 MG TABS Take 1 tablet by mouth at bedtime.  . metFORMIN (GLUCOPHAGE-XR) 500 MG 24 hr tablet TAKE ONE TABLET BY MOUTH EVERY MORNING WITH BREAKFAST, TAKE 1 TABLET WITH LUNCH AND TAKE 2 TABLETS BY MOUTH WITH SUPPER FOR DIABETES  . Multiple Vitamin (MULTIVITAMIN WITH MINERALS) TABS Take 1 tablet by mouth daily.  [name] OVER THE COUNTER MEDICATION Take 1 tablet by mouth daily. Balance B50  . phentermine (ADIPEX-P) 37.5 MG tablet Take 1/2 to 1 tablet every Morning for Dieting & Weight Loss  . vitamin C (ASCORBIC ACID) 500 MG tablet Take 1,000 mg by mouth daily.  [name] zinc gluconate 50 MG tablet Take 50 mg by mouth daily.   No current facility-administered medications on file prior to visit.     Patient Care Team: Unk Pinto, MD as PCP - General (Internal Medicine)  Immunization History  Administered Date(s) Administered  . Influenza Inj Mdck Quad With Preservative 10/10/2017  . Influenza Split 12/01/2013  . Influenza-Unspecified 10/02/2018  . Pneumococcal Polysaccharide-23 10/10/2017  . Td 12/17/2003  . Tdap 02/14/2015   Health Maintenance  Topic Date Due  . Eye exam for diabetics  12/16/1965  . Complete foot exam   05/02/2019  . Hemoglobin A1C  05/29/2019  . Flu Shot  07/17/2019  . Tetanus Vaccine   02/13/2025  . Colon Cancer Screening  12/18/2028  .  Hepatitis C: One time screening is recommended by Center for Disease Control  (CDC) for  adults born from [birth year] through 1966.   Completed  . HIV Screening  Completed   Colonoscopy Dr. Oletta Lamas 12/2018 CXR 12/2015 CT AB 2011 [place] AB 2013 MRI back 2019  Medical History:  Past Medical History:  Diagnosis Date  . Elevated hemoglobin A1c   . Heart murmur    at birth  . History of kidney stones 2011  . Hypertension   . Morbid obesity (BMI 36. 06/07/2015  . 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 Renal disorder    Allergies  Allergies  Allergen Reactions  . Ppd [Tuberculin Purified Protein Derivative]     Pt states he is not allergic to this.Marland Kitchenpositive tb test 2014, pt took tb treatment 2014    SURGICAL HISTORY He  has a past surgical history that includes Hernia repair; Cystoscopy/retrograde/ureteroscopy/stone extraction with basket (5/11); Wisdom tooth extraction (yrs ago); Knee surgery (Right, 11/25/11 and 2015); Cholecystectomy (07/27/2012); and Total knee arthroplasty (Right, 06/06/2015). FAMILY HISTORY His family history includes Diabetes in his brother, father, mother, and sister; Hypertension in his mother; Kidney failure in his mother. SOCIAL HISTORY He  reports that he has been smoking cigarettes. He has a 30.00 pack-year smoking history. He quit smokeless tobacco use about 4 years ago. He reports current alcohol use. He reports that he does not use drugs.    Review of Systems:  Review of Systems  Constitutional: Negative for malaise/fatigue and weight loss.  HENT: Negative for hearing loss and tinnitus.   Eyes: Negative for blurred vision and double vision.  Respiratory: Negative for cough, shortness of breath and wheezing.   Cardiovascular: Negative for chest pain, palpitations, orthopnea, claudication and leg swelling.  Gastrointestinal: Negative for abdominal pain, blood  in stool, constipation, diarrhea, heartburn, melena, nausea and vomiting.  Genitourinary: Negative.   Musculoskeletal: Negative for joint pain and myalgias.  Skin: Negative for rash.  Neurological: Negative for dizziness, tingling, sensory change, weakness and headaches.  Endo/Heme/Allergies: Negative for polydipsia.  Psychiatric/Behavioral: Negative.   All other systems reviewed and are negative.     Physical Exam: BP (!) 142/86   Pulse 81   Temp 97.9 F (36.6 C)   Ht 5' 9.5" (1.765 m)   Wt 241 lb (109.3 kg)   SpO2 96%   BMI 35.08 kg/m  Wt Readings from Last 3 Encounters:  07/15/19 241 lb (109.3 kg)  11/27/18 245 lb 9.6 oz (111.4 kg)  08/21/18 250 lb (113.4 kg)   General Appearance: Well nourished, in no apparent distress. Eyes: PERRLA, EOMs, conjunctiva no swelling or erythema Sinuses: No Frontal/maxillary tenderness ENT/Mouth: Ext aud canals clear, TMs without erythema, bulging. No erythema, swelling, or exudate on post pharynx.  Tonsils not swollen or erythematous. Hearing normal.  Neck: Supple, thyroid normal.  Respiratory: Respiratory effort normal, BS equal bilaterally without rales, rhonchi, wheezing or stridor.  Cardio: RRR with no MRGs. Brisk peripheral pulses without edema.  Abdomen: Soft, + BS.  Non tender, no guarding, rebound, hernias, masses. Lymphatics: Non tender without lymphadenopathy.  Musculoskeletal: Full ROM, 5/5 strength, Normal gait Skin: Warm, dry without rashes, lesions, ecchymosis.  Neuro: Cranial nerves intact. No cerebellar symptoms.  Psych: Awake and oriented X 3, normal affect, Insight and Judgment appropriate.   EKG LAFB, IRBBB, PRWP, no ST changes.   Vicie Mutters, PA-C 3:14 PM [facility]

## 2019-07-15 ENCOUNTER — Other Ambulatory Visit: Payer: Self-pay

## 2019-07-15 ENCOUNTER — Encounter: Payer: Self-pay | Admitting: Physician Assistant

## 2019-07-15 ENCOUNTER — Ambulatory Visit (INDEPENDENT_AMBULATORY_CARE_PROVIDER_SITE_OTHER): Payer: BC Managed Care – PPO | Admitting: Physician Assistant

## 2019-07-15 VITALS — BP 142/86 | HR 81 | Temp 97.9°F | Ht 69.5 in | Wt 241.0 lb

## 2019-07-15 DIAGNOSIS — E782 Mixed hyperlipidemia: Secondary | ICD-10-CM

## 2019-07-15 DIAGNOSIS — F17200 Nicotine dependence, unspecified, uncomplicated: Secondary | ICD-10-CM

## 2019-07-15 DIAGNOSIS — E1122 Type 2 diabetes mellitus with diabetic chronic kidney disease: Secondary | ICD-10-CM | POA: Diagnosis not present

## 2019-07-15 DIAGNOSIS — Z79899 Other long term (current) drug therapy: Secondary | ICD-10-CM

## 2019-07-15 DIAGNOSIS — I10 Essential (primary) hypertension: Secondary | ICD-10-CM | POA: Diagnosis not present

## 2019-07-15 DIAGNOSIS — Z0001 Encounter for general adult medical examination with abnormal findings: Secondary | ICD-10-CM | POA: Diagnosis not present

## 2019-07-15 DIAGNOSIS — Z125 Encounter for screening for malignant neoplasm of prostate: Secondary | ICD-10-CM

## 2019-07-15 DIAGNOSIS — Z130 Encounter for screening for diseases of the blood and blood-forming organs and certain disorders involving the immune mechanism: Secondary | ICD-10-CM

## 2019-07-15 DIAGNOSIS — N181 Chronic kidney disease, stage 1: Secondary | ICD-10-CM

## 2019-07-15 DIAGNOSIS — E559 Vitamin D deficiency, unspecified: Secondary | ICD-10-CM

## 2019-07-15 NOTE — Patient Instructions (Signed)
Get your eye exam     Bad carbs also include fruit juice, alcohol, and sweet tea. These are empty calories that do not signal to your brain that you are full.   Please remember the good carbs are still carbs which convert into sugar. So please measure them out no more than 1/2-1 cup of rice, oatmeal, pasta, and beans  Veggies are however free foods! Pile them on.   Not all fruit is created equal. Please see the list below, the fruit at the bottom is higher in sugars than the fruit at the top. Please avoid all dried fruits.       Diabetes or even increased sugars put you at 300% increased risk of heart attack and stroke.  ALSO BEING DIABETIC YOU MAY NOT HAVE ANY PAIN WITH A HEART ATTACK.  Even worse of a chance of no pain if you are a woman.  It is very unlikely that you will have any pain with a heart attack. Likely your symptoms will be very subtle, even for very severe disease.  Your symptoms for a heart attack will likely occur when you exert your self or exercise and include: Shortness of breath Sweating Nausea Dizziness Fast or irregular heart beats Fatigue   It makes me feel better if my diabetics get their heart rate up with exercise once or twice a week and pay close attention to your body. If there is ANY change in your exercise capacity or if you have symptoms above, please STOP and call 911 or call to come to the office.   PLEASE REMEMBER:  Diabetes is preventable! Up to 14 percent of complications and morbidities among individuals with type 2 diabetes can be prevented, delayed, or effectively treated and minimized with regular visits to a health professional, appropriate monitoring and medication, and a healthy diet and lifestyle.   Here is some information to help you keep your heart healthy: Move it! - Aim for 30 mins of activity every day. Take it slowly at first. Talk to [place] before starting any new exercise program.   Lose it.  -Body Mass Index (BMI) can indicate  if you need to lose weight. A healthy range is 18.5-24.9. For a BMI calculator, go to [employer]  Waist Management -Excess abdominal fat is a risk factor for heart disease, diabetes, asthma, stroke and more. Ideal waist circumference is less than 35" for women and less than 40" for men.   Eat Right -focus on fruits, vegetables, whole grains, and meals you make yourself. Avoid foods with trans fat and high sugar/sodium content.   Snooze or Snore? - Loud snoring can be a sign of sleep apnea, a significant risk factor for high blood pressure, heart attach, stroke, and heart arrhythmias.  Kick the habit -Quit Smoking! Avoid second hand smoke. A single cigarette raises your blood pressure for 20 mins and increases the risk of heart attack and stroke for the next 24 hours.   Are Aspirin and Supplements right for you? -Add ENTERIC COATED low dose 81 mg Aspirin daily OR can do every other day if you have easy bruising to protect your heart and head. As well as to reduce risk of Colon Cancer by 20 %, Skin Cancer by 26 % , Melanoma by 46% and Pancreatic cancer by 60%  Say "No to Stress -There may be little you can do about problems that cause stress. However, techniques such as long walks, meditation, and exercise can help you manage it.   Start  Now! - Make changes one at a time and set reasonable goals to increase your likelihood of success.

## 2019-07-16 LAB — URINALYSIS, ROUTINE W REFLEX MICROSCOPIC
Bacteria, UA: NONE SEEN /HPF
Bilirubin Urine: NEGATIVE
Glucose, UA: NEGATIVE
Hgb urine dipstick: NEGATIVE
Hyaline Cast: NONE SEEN /LPF
Ketones, ur: NEGATIVE
Nitrite: NEGATIVE
Protein, ur: NEGATIVE
Specific Gravity, Urine: 1.022 (ref 1.001–1.03)
Squamous Epithelial / LPF: NONE SEEN /HPF (ref <=5)
pH: <=5 (ref 5.0–8.0)

## 2019-07-16 LAB — LIPID PANEL
Cholesterol: 158 mg/dL (ref <200)
HDL: 38 mg/dL — ABNORMAL LOW (ref >=40)
LDL Cholesterol (Calc): 83 mg/dL (calc)
Non-HDL Cholesterol (Calc): 120 mg/dL (calc) (ref <130)
Total CHOL/HDL Ratio: 4.2 (calc) (ref <5.0)
Triglycerides: 306 mg/dL — ABNORMAL HIGH (ref <150)

## 2019-07-16 LAB — CBC WITH DIFFERENTIAL/PLATELET
Absolute Monocytes: 825 cells/uL (ref 200–950)
Basophils Absolute: 60 cells/uL (ref 0–200)
Basophils Relative: 0.7 %
Eosinophils Absolute: 145 cells/uL (ref 15–500)
Eosinophils Relative: 1.7 %
HCT: 47.7 % (ref 38.5–50.0)
Hemoglobin: 16.1 g/dL (ref 13.2–17.1)
Lymphs Abs: 2244 cells/uL (ref 850–3900)
MCH: 30.6 pg (ref 27.0–33.0)
MCHC: 33.8 g/dL (ref 32.0–36.0)
MCV: 90.7 fL (ref 80.0–100.0)
MPV: 10.8 fL (ref 7.5–12.5)
Monocytes Relative: 9.7 %
Neutro Abs: 5228 cells/uL (ref 1500–7800)
Neutrophils Relative %: 61.5 %
Platelets: 222 10*3/uL (ref 140–400)
RBC: 5.26 10*6/uL (ref 4.20–5.80)
RDW: 13.3 % (ref 11.0–15.0)
Total Lymphocyte: 26.4 %
WBC: 8.5 10*3/uL (ref 3.8–10.8)

## 2019-07-16 LAB — HEMOGLOBIN A1C
Hgb A1c MFr Bld: 6.8 % of total Hgb — ABNORMAL HIGH (ref <5.7)
Mean Plasma Glucose: 148 (calc)
eAG (mmol/L): 8.2 (calc)

## 2019-07-16 LAB — TSH: TSH: 3.08 mIU/L (ref 0.40–4.50)

## 2019-07-16 LAB — IRON,?TOTAL/TOTAL IRON BINDING CAP
%SAT: 18 % (calc) — ABNORMAL LOW (ref 20–48)
Iron: 73 ug/dL (ref 50–180)

## 2019-07-16 LAB — COMPLETE METABOLIC PANEL WITH GFR
AG Ratio: 1.8 (calc) (ref 1.0–2.5)
ALT: 29 U/L (ref 9–46)
AST: 19 U/L (ref 10–35)
Albumin: 4.6 g/dL (ref 3.6–5.1)
Alkaline phosphatase (APISO): 64 U/L (ref 35–144)
BUN: 17 mg/dL (ref 7–25)
CO2: 26 mmol/L (ref 20–32)
Calcium: 10.1 mg/dL (ref 8.6–10.3)
Chloride: 107 mmol/L (ref 98–110)
Creat: 0.83 mg/dL (ref 0.70–1.25)
GFR, Est African American: 108 mL/min/{1.73_m2} (ref >=60)
GFR, Est Non African American: 93 mL/min/{1.73_m2} (ref >=60)
Globulin: 2.5 g/dL (calc) (ref 1.9–3.7)
Glucose, Bld: 110 mg/dL — ABNORMAL HIGH (ref 65–99)
Potassium: 4 mmol/L (ref 3.5–5.3)
Sodium: 143 mmol/L (ref 135–146)
Total Bilirubin: 0.3 mg/dL (ref 0.2–1.2)
Total Protein: 7.1 g/dL (ref 6.1–8.1)

## 2019-07-16 LAB — PSA: PSA: 0.6 ng/mL (ref <=4.0)

## 2019-07-16 LAB — VITAMIN B12: Vitamin B-12: 491 pg/mL (ref 200–1100)

## 2019-07-16 LAB — MICROALBUMIN / CREATININE URINE RATIO
Creatinine, Urine: 130 mg/dL (ref 20–320)
Microalb Creat Ratio: 35 mcg/mg creat — ABNORMAL HIGH (ref <30)
Microalb, Ur: 4.5 mg/dL

## 2019-07-16 LAB — IRON, TOTAL/TOTAL IRON BINDING CAP: TIBC: 399 mcg/dL (calc) (ref 250–425)

## 2019-07-16 LAB — FERRITIN: Ferritin: 90 ng/mL (ref 24–380)

## 2019-07-16 LAB — MAGNESIUM: Magnesium: 1.7 mg/dL (ref 1.5–2.5)

## 2019-07-16 LAB — TESTOSTERONE: Testosterone: 348 ng/dL (ref 250–827)

## 2019-07-16 LAB — VITAMIN D 25 HYDROXY (VIT D DEFICIENCY, FRACTURES): Vit D, 25-Hydroxy: 54 ng/mL (ref 30–100)

## 2019-08-29 ENCOUNTER — Other Ambulatory Visit: Payer: Self-pay | Admitting: Internal Medicine

## 2019-08-29 DIAGNOSIS — E119 Type 2 diabetes mellitus without complications: Secondary | ICD-10-CM

## 2019-09-03 ENCOUNTER — Ambulatory Visit: Payer: Self-pay | Admitting: Orthopedic Surgery

## 2019-09-07 ENCOUNTER — Other Ambulatory Visit: Payer: Self-pay | Admitting: *Deleted

## 2019-09-21 ENCOUNTER — Ambulatory Visit (INDEPENDENT_AMBULATORY_CARE_PROVIDER_SITE_OTHER): Payer: BC Managed Care – PPO | Admitting: Vascular Surgery

## 2019-09-21 ENCOUNTER — Encounter: Payer: Self-pay | Admitting: Vascular Surgery

## 2019-09-21 ENCOUNTER — Other Ambulatory Visit: Payer: Self-pay

## 2019-09-21 VITALS — BP 148/86 | HR 78 | Temp 97.3°F | Resp 20 | Ht 69.5 in | Wt 247.1 lb

## 2019-09-21 DIAGNOSIS — M5136 Other intervertebral disc degeneration, lumbar region: Secondary | ICD-10-CM

## 2019-09-21 NOTE — Progress Notes (Signed)
[facility] [place]  Patient name: Aaron Burns MRN: [id number] DOB: 12/12/1956 Sex: male  REASON FOR CONSULT: Discuss access for L4-5 spinal fusion with Dr. Rolena Infante  HPI: Aaron Burns is a 63 y.o. male, who is here today with his wife for discussion of oblique approach for L4-5 spinal fusion.  He has had progressive difficulty with low back pain and is failed conservative treatment.  This does extend into his hips and both legs as well.  He has had prior cholecystectomy through a laparoscopic approach.  No other abdominal surgery.  No history of peripheral vascular occlusive disease.  Past Medical History:  Diagnosis Date  . Elevated hemoglobin A1c   . Heart murmur    at birth  . History of kidney stones 2011  . Hypertension   . Low back pain    Radiates to left ankle.  Injured after falling on back.  . Morbid obesity (BMI 36. 06/07/2015  . 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 Renal disorder     Family History  Problem Relation Age of Onset  . Hypertension Mother   . Diabetes Mother   . Kidney failure Mother   . Diabetes Father   . Diabetes Sister   . Diabetes Brother     SOCIAL HISTORY: Social History   Socioeconomic History  . Marital status: Married    Spouse name: Not on file  . Number of children: Not on file  . Years of education: Not on file  . Highest education level: Not on file  Occupational History  . Not on file  Social Needs  . Financial resource strain: Not on file  . Food insecurity    Worry: Not on file    Inability: Not on file  . Transportation needs    Medical: Not on file    Non-medical: Not on file  Tobacco Use  . Smoking status: Current Some Day Smoker    Packs/day: 0.25    Years: 30.00    Pack years: 7.50    Types: Cigarettes    Last attempt to quit: 12/16/2013    Years since quitting: 5.7  . Smokeless tobacco: Former Systems developer   Quit date: 08/16/2014  Substance and Sexual Activity  . Alcohol use: Yes    Alcohol/week: 0.0 standard drinks    Comment: occasional  . Drug use: No  . Sexual activity: Not on file  Lifestyle  . Physical activity    Days per week: Not on file    Minutes per session: Not on file  . Stress: Not on file  Relationships  . Social Herbalist on phone: Not on file    Gets together: Not on file    Attends religious service: Not on file    Active member of club or organization: Not on file    Attends meetings of clubs or organizations: Not on file    Relationship status: Not on file  . Intimate partner violence    Fear of current or ex partner: Not on file    Emotionally abused: Not on file    Physically abused: Not on file    Forced sexual activity: Not on file  Other Topics Concern  . Not on file  Social History Narrative  . Not on file    Allergies  Allergen Reactions  . Ppd [Tuberculin Purified Protein Derivative]     Pt states he is not  allergic to this.Marland Kitchenpositive tb test 2014, pt took tb treatment 2014    Current Outpatient Medications  Medication Sig Dispense Refill  . bisoprolol-hydrochlorothiazide (ZIAC) 5-6.25 MG tablet TAKE ONE TABLET BY MOUTH EVERY MORNING FOR BLOOD PRESSURE 90 tablet 1  . Cholecalciferol (VITAMIN D3 MAXIMUM STRENGTH) 5000 UNITS capsule Take 10,000 Units by mouth daily.     [name] CINNAMON PO Take 1,000 mg by mouth daily.    . Magnesium 250 MG TABS Take 1 tablet by mouth at bedtime.    . metFORMIN (GLUCOPHAGE-XR) 500 MG 24 hr tablet Take 1 tablet with Bkfst & Lunch & 2 tablets with Supper for Diabetes 360 tablet 0  . Multiple Vitamin (MULTIVITAMIN WITH MINERALS) TABS Take 1 tablet by mouth daily.    [name] OVER THE COUNTER MEDICATION Take 1 tablet by mouth daily. Balance B50    . phentermine (ADIPEX-P) 37.5 MG tablet Take 1/2 to 1 tablet every Morning for Dieting & Weight Loss 90 tablet 1  . vitamin C (ASCORBIC ACID) 500 MG tablet Take 1,000 mg by mouth  daily.    [name] aspirin 81 MG chewable tablet Chew 81 mg by mouth daily.    [name] zinc gluconate 50 MG tablet Take 50 mg by mouth daily.     No current facility-administered medications for this visit.     REVIEW OF SYSTEMS:  [X]  denotes positive finding, [ ]  denotes negative finding Cardiac  Comments:  Chest pain or chest pressure:    Shortness of breath upon exertion:    Short of breath when lying flat:    Irregular heart rhythm:        Vascular    Pain in calf, thigh, or hip brought on by ambulation: x  neurogenic  Pain in feet at night that wakes you up from your sleep:     Blood clot in your veins:    Leg swelling:         Pulmonary    Oxygen at home:    Productive cough:     Wheezing:         Neurologic    Sudden weakness in arms or legs:     Sudden numbness in arms or legs:     Sudden onset of difficulty speaking or slurred speech:    Temporary loss of vision in one eye:     Problems with dizziness:         Gastrointestinal    Blood in stool:     Vomited blood:         Genitourinary    Burning when urinating:     Blood in urine:        Psychiatric    Major depression:         Hematologic    Bleeding problems:    Problems with blood clotting too easily:        Skin    Rashes or ulcers:        Constitutional    Fever or chills:      PHYSICAL EXAM: Vitals:   09/21/19 1450  BP: (!) 148/86  Pulse: 78  Resp: 20  Temp: (!) 97.3 F (36.3 C)  SpO2: 96%  Weight: 247 lb 1.6 oz (112.1 kg)  Height: 5' 9.5" (1.765 m)    GENERAL: The patient is a well-nourished male, in no acute distress. The vital signs are documented above. CARDIOVASCULAR: 2+ radial and 2+ dorsalis pedis pulses bilaterally PULMONARY: There is good air exchange  ABDOMEN: Soft and non-tender  MUSCULOSKELETAL: There are no major deformities or cyanosis. NEUROLOGIC: No focal weakness or paresthesias are detected. SKIN: There are no ulcers or rashes noted. PSYCHIATRIC: The patient has a normal  affect.  DATA:  CT scan from 2011 was reviewed showing normal location of aortic and iliac bifurcation.  Also minimal atherosclerotic change at that time  MEDICAL ISSUES: A long discussion with the patient regarding my role in access.  He and his wife have multiple questions regarding the specifics of his spinal fusion and the approach to the lateral and posterior aspect.  I have asked him to address this with Dr. Rolena Infante.  I did explain the oblique approach to his L4-5 disc with mobilization of intraperitoneal contents and vascular structures overlying the spine.  Discussed potential injury for all these particularly the risk for venous injury.  I do not feel that he is a prohibitive risk.  He is obese but has had no prior pelvic surgery and no evidence of severe atherosclerotic disease.  We will proceed as planned with oblique approach for L4-5 disc fusion   Rosetta Posner, MD [facility] Tel [phone] Pager [phone]

## 2019-09-28 ENCOUNTER — Other Ambulatory Visit: Payer: Self-pay | Admitting: Internal Medicine

## 2019-09-28 DIAGNOSIS — M5416 Radiculopathy, lumbar region: Secondary | ICD-10-CM | POA: Diagnosis not present

## 2019-10-01 ENCOUNTER — Ambulatory Visit: Payer: Self-pay | Admitting: Orthopedic Surgery

## 2019-10-01 NOTE — H&P (Signed)
Subjective:    Aaron Burns is a pleasant 63 year old male past medical history significant for Diabetes (last A1c was less than 7) who is been dealing with low back pain In bilateral neuropathic leg pain left greater than right for quite some time. The pain continues to be severe and debilitating despite conservative measures including time, medications, physical therapy, and injection therapy. He would like to move forward with surgical intervention. He is scheduled for a 2 day surgery with Dr. Rolena Burns. Day 1. OLIF L4-5, XLIF L3-4 10/07/19 Day 2. PSFI, poss decompression L3-5 on 10/08/19 At [facility].  We do have clearance from the patient's PCP, however this is greater than 3 months old and we will need to get new clearance.  Patient has been seen and evaluated by vascular.  Patient was provided Aaron Burns a brace at today's visit  Patient Active Problem List   Diagnosis Date Noted  . FH: hypertension 11/26/2018  . Smoker 11/26/2018  . Morbid obesity (BMI 36.31)  06/07/2015  . Medication management 06/10/2014  . Vitamin D deficiency 11/30/2013  . Mixed hyperlipidemia 11/30/2013  . Hypertension   . Type 2 diabetes mellitus (HCC)    Past Medical History:  Diagnosis Date  . Elevated hemoglobin A1c   . Heart murmur    at birth  . History of kidney stones 2011  . Hypertension   . Low back pain    Radiates to left ankle.  Injured after falling on back.  . Morbid obesity (BMI 36. 06/07/2015  . 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 Renal disorder     Past Surgical History:  Procedure Laterality Date  . CHOLECYSTECTOMY  07/27/2012   Procedure: LAPAROSCOPIC CHOLECYSTECTOMY WITH INTRAOPERATIVE CHOLANGIOGRAM;  Surgeon: Marcello Moores A. Cornett, MD;  Location: [place];  Service: General;  Laterality: N/A;  . CYSTOSCOPY/RETROGRADE/URETEROSCOPY/STONE EXTRACTION WITH BASKET  5/11  . HERNIA REPAIR     as a baby  . KNEE SURGERY Right 11/25/11 and 2015    arthroscopic, cortisone injections done also  . TOTAL KNEE ARTHROPLASTY Right 06/06/2015   Procedure: RIGHT TOTAL KNEE ARTHROPLASTY;  Surgeon: Paralee Cancel, MD;  Location: [facility];  Service: Orthopedics;  Laterality: Right;  . WISDOM TOOTH EXTRACTION  yrs ago    Current Outpatient Medications  Medication Sig Dispense Refill Last Dose  . aspirin 81 MG chewable tablet Chew 81 mg by mouth daily.   Not Taking  . bisoprolol-hydrochlorothiazide (ZIAC) 5-6.25 MG tablet Take 1 tablet Daily for BP 90 tablet 1   . Cholecalciferol (VITAMIN D3 MAXIMUM STRENGTH) 5000 UNITS capsule Take 10,000 Units by mouth daily.    Taking  . CINNAMON PO Take 1,000 mg by mouth daily.   Taking  . Magnesium 250 MG TABS Take 1 tablet by mouth at bedtime.   Taking  . metFORMIN (GLUCOPHAGE-XR) 500 MG 24 hr tablet Take 1 tablet with Bkfst & Lunch & 2 tablets with Supper for Diabetes 360 tablet 0 Taking  . Multiple Vitamin (MULTIVITAMIN WITH MINERALS) TABS Take 1 tablet by mouth daily.   Taking  . OVER THE COUNTER MEDICATION Take 1 tablet by mouth daily. Balance B50   Taking  . phentermine (ADIPEX-P) 37.5 MG tablet Take 1/2 to 1 tablet every Morning for Dieting & Weight Loss 90 tablet 1 Taking  . vitamin C (ASCORBIC ACID) 500 MG tablet Take 1,000 mg by mouth daily.   Taking  . zinc gluconate 50 MG tablet Take 50 mg by  mouth daily.   Not Taking   No current facility-administered medications for this visit.    Allergies  Allergen Reactions  . Ppd [Tuberculin Purified Protein Derivative]     Pt states he is not allergic to this.Marland Kitchenpositive tb test 2014, pt took tb treatment 2014    Social History   Tobacco Use  . Smoking status: Current Some Day Smoker    Packs/day: 0.25    Years: 30.00    Pack years: 7.50    Types: Cigarettes    Last attempt to quit: 12/16/2013    Years since quitting: 5.7  . Smokeless tobacco: Former Systems developer    Quit date: 08/16/2014  Substance Use Topics  . Alcohol use: Yes    Alcohol/week: 0.0 standard  drinks    Comment: occasional    Family History  Problem Relation Age of Onset  . Hypertension Mother   . Diabetes Mother   . Kidney failure Mother   . Diabetes Father   . Diabetes Sister   . Diabetes Brother     Review of Systems As stated in HPI  Objective:   Vitals: Ht: 5 ft 9 in 09/28/2019 03:33 pm Wt: 238 lbs 09/28/2019 03:34 pm BMI: 35.1 09/28/2019 03:34 pm BP: 170/99 sitting R arm 09/28/2019 03:36 pm Pulse: 65 bpm 09/28/2019 03:36 pm  Patient is a 63 year old male.  Clinical exam: Patient is alert and oriented 3. No shortness of breath, chest pain. Heart: regular rate and rhythm. No rubs, murmurs, or gallops. Lungs: Clear to auscultation bilaterally Abdomen: Bowel sounds 4Soft and nontender, no rebound tenderness, no incontinence of bowel and bladder  Lumbar spine: Significant low back pain radiating predominantly into the lower extremities left side seems to be worse than the right at this point time. No hip, knee, ankle pain with isolated joint range of motion. Pain with rotation flexion and extension of the lumbar spine.  Neuro: 5/5 motor strength in the lower extremity bilaterallyPositive numbness and dysesthesias in the L3 and L4 dermatome bilaterally. Negative nerve root tension signs in the lower extremity. Negative Babinski test, no clonus.  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 Slight degenerative scoliosis is noted.  MRI dated 07/05/19 was reviewed. I reviewed the radiology report I do agree with the. He has moderate degenerative stenosis and degenerative disease at L1-L2 3. There is some contact with the exiting and traversing L1 and L2 nerve roots but no severe neural compression. L4-5 and L3-4 have moderate to severe spinal canal stenosis as well as lateral recess stenosis and severe bilateral foraminal stenosis at L4/5. There is  moderate to severe foraminal stenosis right-sided L3-4 and severe left foraminal stenosis. Mild disc bulge L5-S1 with facet arthrosis.  Assessment:   Aaron Burns is a very 64 year old gentleman with severe debilitating back buttock and neuropathic leg pain. Despite physical therapy, injection therapy, activity modification, and medications his quality-of-life continues to deteriorate the pain is getting progressively worse.  Based on the imaging studies I do believe that the L3-4 and L4-5 levels are his primary source of pain. While he does have degenerative disease at L1-2 and L2-3 it is not severe enough that I think including them in the surgical procedure is advisable. 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 Our goal is to reduce his pain, improve his quality-of-life, Aaron Burns him to return to work. Overall potential success rate is 70%. And I did indicate that that means there is a 30% risk of either no improvement or worsening of his symptoms. I have also gone over the additional risks of surgery with the patient and his wife and all their questions were encouraged.  Plan:   Day 1. OLIF L4-5, XLIF L3-4  Day 2. PSFI, poss decompression L3-5   Risks, benefits of surgery were reviewed with the patient. These include: infection, bleeding, death, stroke, paralysis, ongoing or worse pain, need for additional surgery, injury to the lumbar plexus resulting in hip flexor weakness and difficulty walking without assistive devices. Adjacent segment degenerative disease, need for additional surgery including fusing other levels, leak of spinal fluid, Nonunion, hardware failure, breakage, or mal-position. Deep venous thrombosis (DVT) requiring additional treatment such as filter, and/or medications. Injury to abdominal contents, loss in bowel and bladder  control.  We have also discussed the goals of surgery to include: Goals of surgery: Reduction in pain, and improvement in quality of life.  We have also discussed the post-operative recovery period to include: bathing/showering restrictions, wound healing, activity (and driving) restrictions, medications/pain mangement.  We have also discussed post-operative redflags to include: signs and symptoms of postoperative infection, DVT/PE.  We reviewed the patient's medications. He is not on any blood thinners. I have advised him not to take any aspirin or anti-inflammatory medications for 7 days prior to surgery. I have also advised him to stop his vitamins, herbal supplements.  Surgical plan: Oblique lateral interbody fusion L4-5. Lateral interbody fusion L3-4. I would then do a second staged posterior instrumented fusion. If he continued to have significant neuropathic leg pain following the interbody surgical portion of the surgery and I would supplement the posterior instrumentation with a formal decompression. Therefore this will be a planned two-stage surgical procedure.  Treatment plan: Need updated primary care clearance. We do have vascular clearance. Patient does have a LSO brace. Patient does have appointment scheduled at [facility] for preoperative testing.  Follow-up: 2 weeks postoperatively

## 2019-10-01 NOTE — Pre-Procedure Instructions (Signed)
[place], [place] [address] Phone: [phone] Fax: [phone]      Your procedure is scheduled on 10-07-19  Report to [facility] Main Entrance "A" at 0530 A.M., and check in at the Admitting office.  Call this number if you have problems the morning of surgery:  [phone]  Call [phone] if you have any questions prior to your surgery date Monday-Friday 8am-4pm    Remember:  Do not eat or drink after midnight the night before your surgery  Take these medicines the morning of surgery with A SIP OF WATER:none  Follow your surgeon's instructions on when to stop Aspirin.  If no instructions were given by your surgeon then you will need to call the office to get those instructions.    7 days prior to surgery STOP taking any Aspirin (unless otherwise instructed by your surgeon), Aleve, Naproxen, Ibuprofen, Motrin, Advil, Goody's, BC's, all herbal medications, fish oil, and all vitamins.   WHAT DO I DO ABOUT MY DIABETES MEDICATION?   [name] Do not take oral diabetes medicines (pills) including Metformin  the morning of surgery.   How to Manage Your Diabetes Before and After Surgery  Why is it important to control my blood sugar before and after surgery? . Improving blood sugar levels before and after surgery helps healing and can limit problems. . A way of improving blood sugar control is eating a healthy diet by: o  Eating less sugar and carbohydrates o  Increasing activity/exercise o  Talking with your doctor about reaching your blood sugar goals . High blood sugars (greater than 180 mg/dL) can raise your risk of infections and slow your recovery, so you will need to focus on controlling your diabetes during the weeks before surgery. . Make sure that the doctor who takes care of your diabetes knows about your planned surgery including the date and location.  How do I manage my blood  sugar before surgery? . Check your blood sugar at least 4 times a day, starting 2 days before surgery, to make sure that the level is not too high or low. o Check your blood sugar the morning of your surgery when you wake up and every 2 hours until you get to the Short Stay unit. . If your blood sugar is less than 70 mg/dL, you will need to treat for low blood sugar: o Do not take insulin. o Treat a low blood sugar (less than 70 mg/dL) with  cup of clear juice (cranberry or apple), 4 glucose tablets, OR glucose gel. o Recheck blood sugar in 15 minutes after treatment (to make sure it is greater than 70 mg/dL). If your blood sugar is not greater than 70 mg/dL on recheck, call [phone] for further instructions. . Report your blood sugar to the short stay nurse when you get to Short Stay.  . If you are admitted to the hospital after surgery: o Your blood sugar will be checked by the staff and you will probably be given insulin after surgery (instead of oral diabetes medicines) to make sure you have good blood sugar levels. o The goal for blood sugar control after surgery is 80-180 mg/dL.   The Morning of Surgery  Do not wear jewelry, make-up or nail polish.  Do not wear lotions, powders, or perfumes/colognes, or deodorant  Do not shave 48 hours prior to surgery.  Men may shave face and neck.  Do  not bring valuables to the hospital.  [facility] is not responsible for any belongings or valuables.  If you are a smoker, DO NOT Smoke 24 hours prior to surgery IF you wear a CPAP at night please bring your mask, tubing, and machine the morning of surgery   Remember that you must have someone to transport you home after your surgery, and remain with you for 24 hours if you are discharged the same day.   Contacts, glasses, hearing aids, dentures or bridgework may not be worn into surgery.    Leave your suitcase in the car.  After surgery it may be brought to your room.  For patients  admitted to the hospital, discharge time will be determined by your treatment team.  Patients discharged the day of surgery will not be allowed to drive home.    Special instructions:   [facility]- Preparing For Surgery  Before surgery, you can play an important role. Because skin is not sterile, your skin needs to be as free of germs as possible. You can reduce the number of germs on your skin by washing with CHG (chlorahexidine gluconate) Soap before surgery.  CHG is an antiseptic cleaner which kills germs and bonds with the skin to continue killing germs even after washing.    Oral Hygiene is also important to reduce your risk of infection.  Remember - BRUSH YOUR TEETH THE MORNING OF SURGERY WITH YOUR REGULAR TOOTHPASTE  Please do not use if you have an allergy to CHG or antibacterial soaps. If your skin becomes reddened/irritated stop using the CHG.  Do not shave (including legs and underarms) for at least 48 hours prior to first CHG shower. It is OK to shave your face.  Please follow these instructions carefully.   1. Shower the NIGHT BEFORE SURGERY and the MORNING OF SURGERY with CHG Soap.   2. If you chose to wash your hair, wash your hair first as usual with your normal shampoo.  3. After you shampoo, rinse your hair and body thoroughly to remove the shampoo.  4. Use CHG as you would any other liquid soap. You can apply CHG directly to the skin and wash gently with a scrungie or a clean washcloth.   5. Apply the CHG Soap to your body ONLY FROM THE NECK DOWN.  Do not use on open wounds or open sores. Avoid contact with your eyes, ears, mouth and genitals (private parts). Wash Face and genitals (private parts)  with your normal soap.   6. Wash thoroughly, paying special attention to the area where your surgery will be performed.  7. Thoroughly rinse your body with warm water from the neck down.  8. DO NOT shower/wash with your normal soap after using and rinsing off the CHG  Soap.  9. Pat yourself dry with a CLEAN TOWEL.  10. Wear CLEAN PAJAMAS to bed the night before surgery, wear comfortable clothes the morning of surgery  11. Place CLEAN SHEETS on your bed the night of your first shower and DO NOT SLEEP WITH PETS.  Day of Surgery:  Do not apply any deodorants/lotions. Please shower the morning of surgery with the CHG soap  Please wear clean clothes to the hospital/surgery center.   Remember to brush your teeth WITH YOUR REGULAR TOOTHPASTE.   Please read over the  fact sheets that you were given.

## 2019-10-04 ENCOUNTER — Other Ambulatory Visit (HOSPITAL_COMMUNITY)
Admission: RE | Admit: 2019-10-04 | Discharge: 2019-10-04 | Disposition: A | Discharge status: Home / Self Care | Payer: BC Managed Care – PPO | Source: Ambulatory Visit | Attending: Orthopedic Surgery | Admitting: Orthopedic Surgery

## 2019-10-04 ENCOUNTER — Encounter (HOSPITAL_COMMUNITY): Payer: Self-pay

## 2019-10-04 ENCOUNTER — Encounter (HOSPITAL_COMMUNITY)
Admission: RE | Admit: 2019-10-04 | Discharge: 2019-10-04 | Disposition: A | Discharge status: Home / Self Care | Payer: BC Managed Care – PPO | Source: Ambulatory Visit | Attending: Orthopedic Surgery | Admitting: Orthopedic Surgery

## 2019-10-04 ENCOUNTER — Other Ambulatory Visit: Payer: Self-pay

## 2019-10-04 ENCOUNTER — Ambulatory Visit (HOSPITAL_COMMUNITY)
Admission: RE | Admit: 2019-10-04 | Discharge: 2019-10-04 | Disposition: A | Discharge status: Home / Self Care | Payer: BC Managed Care – PPO | Source: Ambulatory Visit | Attending: Orthopedic Surgery | Admitting: Orthopedic Surgery

## 2019-10-04 DIAGNOSIS — E118 Type 2 diabetes mellitus with unspecified complications: Secondary | ICD-10-CM | POA: Insufficient documentation

## 2019-10-04 DIAGNOSIS — Z87891 Personal history of nicotine dependence: Secondary | ICD-10-CM | POA: Insufficient documentation

## 2019-10-04 DIAGNOSIS — Z20828 Contact with and (suspected) exposure to other viral communicable diseases: Secondary | ICD-10-CM | POA: Diagnosis not present

## 2019-10-04 DIAGNOSIS — M79606 Pain in leg, unspecified: Secondary | ICD-10-CM | POA: Insufficient documentation

## 2019-10-04 DIAGNOSIS — Z7982 Long term (current) use of aspirin: Secondary | ICD-10-CM | POA: Insufficient documentation

## 2019-10-04 DIAGNOSIS — Z01812 Encounter for preprocedural laboratory examination: Secondary | ICD-10-CM | POA: Insufficient documentation

## 2019-10-04 DIAGNOSIS — Z7901 Long term (current) use of anticoagulants: Secondary | ICD-10-CM | POA: Insufficient documentation

## 2019-10-04 DIAGNOSIS — I10 Essential (primary) hypertension: Secondary | ICD-10-CM | POA: Diagnosis not present

## 2019-10-04 DIAGNOSIS — Z79899 Other long term (current) drug therapy: Secondary | ICD-10-CM | POA: Diagnosis not present

## 2019-10-04 DIAGNOSIS — Z6836 Body mass index (BMI) 36.0-36.9, adult: Secondary | ICD-10-CM | POA: Insufficient documentation

## 2019-10-04 DIAGNOSIS — Z7984 Long term (current) use of oral hypoglycemic drugs: Secondary | ICD-10-CM | POA: Diagnosis not present

## 2019-10-04 DIAGNOSIS — M4180 Other forms of scoliosis, site unspecified: Secondary | ICD-10-CM | POA: Diagnosis not present

## 2019-10-04 DIAGNOSIS — Z01818 Encounter for other preprocedural examination: Secondary | ICD-10-CM

## 2019-10-04 HISTORY — DX: Respiratory tuberculosis unspecified: A15.9

## 2019-10-04 HISTORY — DX: Type 2 diabetes mellitus without complications: E11.9

## 2019-10-04 LAB — URINALYSIS, COMPLETE (UACMP) WITH MICROSCOPIC
Bilirubin Urine: NEGATIVE
Glucose, UA: NEGATIVE mg/dL
Hgb urine dipstick: NEGATIVE
Ketones, ur: NEGATIVE mg/dL
Leukocytes,Ua: NEGATIVE
Nitrite: NEGATIVE
Protein, ur: 30 mg/dL — AB
Specific Gravity, Urine: 1.018 (ref 1.005–1.030)
pH: 6 (ref 5.0–8.0)

## 2019-10-04 LAB — CBC
HCT: 49.5 % (ref 39.0–52.0)
Hemoglobin: 16.8 g/dL (ref 13.0–17.0)
MCH: 30.9 pg (ref 26.0–34.0)
MCHC: 33.9 g/dL (ref 30.0–36.0)
MCV: 91.2 fL (ref 80.0–100.0)
Platelets: 217 10*3/uL (ref 150–400)
RBC: 5.43 MIL/uL (ref 4.22–5.81)
RDW: 13.2 % (ref 11.5–15.5)
WBC: 9.2 10*3/uL (ref 4.0–10.5)
nRBC: 0 % (ref 0.0–0.2)

## 2019-10-04 LAB — SURGICAL PCR SCREEN
MRSA, PCR: NEGATIVE
Staphylococcus aureus: NEGATIVE

## 2019-10-04 LAB — BASIC METABOLIC PANEL
Anion gap: 9 (ref 5–15)
BUN: 13 mg/dL (ref 8–23)
CO2: 30 mmol/L (ref 22–32)
Calcium: 9.6 mg/dL (ref 8.9–10.3)
Chloride: 103 mmol/L (ref 98–111)
Creatinine, Ser: 0.76 mg/dL (ref 0.61–1.24)
GFR calc Af Amer: >60 mL/min (ref >60)
GFR calc non Af Amer: >60 mL/min (ref >60)
Glucose, Bld: 156 mg/dL — ABNORMAL HIGH (ref 70–99)
Potassium: 4 mmol/L (ref 3.5–5.1)
Sodium: 142 mmol/L (ref 135–145)

## 2019-10-04 LAB — PROTIME-INR
INR: 0.9 (ref 0.8–1.2)
Prothrombin Time: 12.1 seconds (ref 11.4–15.2)

## 2019-10-04 LAB — GLUCOSE, CAPILLARY: Glucose-Capillary: 153 mg/dL — ABNORMAL HIGH (ref 70–99)

## 2019-10-04 LAB — TYPE AND SCREEN
ABO/RH(D): A NEG
Antibody Screen: NEGATIVE

## 2019-10-04 LAB — APTT: aPTT: 28 seconds (ref 24–36)

## 2019-10-04 LAB — ABO/RH: ABO/RH(D): A NEG

## 2019-10-04 NOTE — Progress Notes (Signed)
PCP - Unk Pinto Cardiologist - denies   Chest x-ray - 10/04/19 EKG - 07/15/19 Stress Test - 2008? Couldn't find records ECHO - denies Cardiac Cath - denies  Patient stated that he doesn't check his blood sugars  Aspirin Instructions: last dose 10/13    COVID TEST- 10/19   Anesthesia review: yes, per Dr Rolena Infante  Patient denies shortness of breath, fever, cough and chest pain at PAT appointment   All instructions explained to the patient, with a verbal understanding of the material. Patient agrees to go over the instructions while at home for a better understanding. Patient also instructed to self quarantine after being tested for COVID-19. The opportunity to ask questions was provided.

## 2019-10-04 NOTE — Anesthesia Preprocedure Evaluation (Addendum)
Anesthesia Evaluation  Patient identified by MRN, date of birth, ID band Patient awake    Reviewed: Allergy & Precautions, NPO status , Patient's Chart, lab work & pertinent test results  History of Anesthesia Complications Negative for: history of anesthetic complications  Airway Mallampati: II  TM Distance: >3 FB Neck ROM: Full    Dental   Pulmonary Patient abstained from smoking., former smoker (quit 09/27/19),    Pulmonary exam normal        Cardiovascular hypertension, Pt. on medications Normal cardiovascular exam     Neuro/Psych negative neurological ROS  negative psych ROS   GI/Hepatic negative GI ROS, Neg liver ROS,   Endo/Other  diabetes, Type 2, Oral Hypoglycemic Agents  Renal/GU negative Renal ROS  negative genitourinary   Musculoskeletal negative musculoskeletal ROS (+)   Abdominal   Peds  Hematology negative hematology ROS (+)   Anesthesia Other Findings   Reproductive/Obstetrics                           Anesthesia Physical Anesthesia Plan  ASA: II  Anesthesia Plan: General   Post-op Pain Management: GA combined w/ Regional for post-op pain   Induction: Intravenous  PONV Risk Score and Plan: 2 and Ondansetron, Dexamethasone, Treatment may vary due to age or medical condition and Midazolam  Airway Management Planned: Oral ETT  Additional Equipment: None  Intra-op Plan:   Post-operative Plan: Extubation in OR  Informed Consent: I have reviewed the patients History and Physical, chart, labs and discussed the procedure including the risks, benefits and alternatives for the proposed anesthesia with the patient or authorized representative who has indicated his/her understanding and acceptance.     Dental advisory given  Plan Discussed with:   Anesthesia Plan Comments: (TAP block)      Anesthesia Quick Evaluation

## 2019-10-04 NOTE — Progress Notes (Addendum)
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 He is also scheduled for posterior spinal fusion interbody, possible decompression L3-5 on 10/08/19 by Dr. Rolena Infante.   History includes recent former smoker (quit 09/27/19), HTN, DM2, murmur at birth, TB (s/p treatment in 2014), nephrolithiasis. S/p right TKA 06/06/15, cholecystectomy 07/27/12. BMI is consistent with obesity.    Patient seen on 07/15/19 by Vicie Mutters, PA-C for surgical clearance with EKG and labs. EKG stable, renal function and CBC normal, A1c 6.8%. Clearance Letter sent to surgeon (scanned under Media tab).   He denied chest pain, cough, fever, and SOB at PAT RN visit. Last ASA 09/28/19. 10/04/19 COVID-19 test is negative. If no acute changes then I would anticipate that he can proceed as planned.    VS: BP (!) 175/63   Pulse (!) 54   Temp 36.7 C   Resp 20   Ht 5\' 9"  (1.753 m)   Wt 113.4 kg   SpO2 98%   BMI 36.92 kg/m   BP Readings from Last 3 Encounters:  10/04/19 (!) 175/63  09/21/19 (!) 148/86  07/15/19 (!) 142/86    PROVIDERS: Unk Pinto, MD is PCP    LABS: Labs reviewed: Acceptable for surgery.  (all labs ordered are listed, but only abnormal results are displayed)  Labs Reviewed  GLUCOSE, CAPILLARY - Abnormal; Notable for the following components:      Result Value   Glucose-Capillary 153 (*)    All other components within normal limits  BASIC METABOLIC PANEL - Abnormal; Notable for the following components:   Glucose, Bld 156 (*)    All other  components within normal limits  URINALYSIS, COMPLETE (UACMP) WITH MICROSCOPIC - Abnormal; Notable for the following components:   Protein, ur 30 (*)    Bacteria, UA RARE (*)    All other components within normal limits  HEMOGLOBIN A1C - Abnormal; Notable for the following components:   Hgb A1c MFr Bld 7.3 (*)    All other components within normal limits  SURGICAL PCR SCREEN  APTT  CBC  PROTIME-INR  TYPE AND SCREEN  ABO/RH     IMAGES: CXR 10/04/19: FINDINGS: The cardiomediastinal silhouette is within normal limits. The lungs are well inflated and clear. There is no evidence of pleural effusion or pneumothorax. No acute osseous abnormality is identified. IMPRESSION: No active cardiopulmonary disease.   EKG: 07/15/19: SR, LAFB. RSR prime in V1.  - EKG appears stable when compared to previous tracing on 05/01/18.    CV: He denied prior echo and cath. He thought he may have had a stress test aournd 2008, but unable to locate any records.    Past Medical History:  Diagnosis Date  . Elevated hemoglobin A1c   . Heart murmur    at birth  . History of kidney stones 2011  . Hypertension   . Low back pain    Radiates to left ankle.  Injured after falling on back.  . Morbid obesity (BMI 36. 06/07/2015  .  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 Renal disorder   . Tuberculosis    2014 dx, was treated with medications for about 4 months    Past Surgical History:  Procedure Laterality Date  . CHOLECYSTECTOMY  07/27/2012   Procedure: LAPAROSCOPIC CHOLECYSTECTOMY WITH INTRAOPERATIVE CHOLANGIOGRAM;  Surgeon: Marcello Moores A. Cornett, MD;  Location: [place];  Service: General;  Laterality: N/A;  . COLONOSCOPY    . CYSTOSCOPY/RETROGRADE/URETEROSCOPY/STONE EXTRACTION WITH BASKET  5/11  . HERNIA REPAIR     as a baby  . KNEE SURGERY Right 11/25/11 and 2015   arthroscopic, cortisone injections done also  . TOTAL KNEE ARTHROPLASTY Right  06/06/2015   Procedure: RIGHT TOTAL KNEE ARTHROPLASTY;  Surgeon: Paralee Cancel, MD;  Location[facility];  Service: Orthopedics;  Laterality: Right;  . WISDOM TOOTH EXTRACTION  yrs ago    MEDICATIONS: . aspirin 81 MG chewable tablet  . bisoprolol-hydrochlorothiazide (ZIAC) 5-6.25 MG tablet  . Cholecalciferol (VITAMIN D3 MAXIMUM STRENGTH) 5000 UNITS capsule  . CINNAMON PO  . Magnesium 250 MG TABS  . metFORMIN (GLUCOPHAGE-XR) 500 MG 24 hr tablet  . Multiple Vitamin (MULTIVITAMIN WITH MINERALS) TABS  . phentermine (ADIPEX-P) 37.5 MG tablet  . vitamin C (ASCORBIC ACID) 500 MG tablet  . zinc gluconate 50 MG tablet   No current facility-administered medications for this encounter.    ASA, Vitamin D, cinnamon, Magnesium, MVI, phentermine, Vitamin C, and zinc gluconate are all currently on hold.    Myra Gianotti, PA-C Surgical Short Stay/Anesthesiology [facility] Phone [phone] [facility] Phone [phone] 10/05/2019 9:25 AM

## 2019-10-05 LAB — HEMOGLOBIN A1C
Hgb A1c MFr Bld: 7.3 % — ABNORMAL HIGH (ref 4.8–5.6)
Mean Plasma Glucose: 163 mg/dL

## 2019-10-05 LAB — NOVEL CORONAVIRUS, NAA (HOSP ORDER, SEND-OUT TO REF LAB; TAT 18-24 HRS): SARS-CoV-2, NAA: NOT DETECTED

## 2019-10-07 ENCOUNTER — Inpatient Hospital Stay (HOSPITAL_COMMUNITY)
Admission: RE | Admit: 2019-10-07 | Discharge: 2019-10-12 | DRG: 455 | Disposition: A | Discharge status: Rehabilitation Facility | Payer: BC Managed Care – PPO | Attending: Orthopedic Surgery | Admitting: Orthopedic Surgery

## 2019-10-07 ENCOUNTER — Other Ambulatory Visit: Payer: Self-pay

## 2019-10-07 ENCOUNTER — Encounter (HOSPITAL_COMMUNITY): Payer: Self-pay

## 2019-10-07 ENCOUNTER — Inpatient Hospital Stay (HOSPITAL_COMMUNITY): Payer: BC Managed Care – PPO | Admitting: Certified Registered"

## 2019-10-07 ENCOUNTER — Encounter (HOSPITAL_COMMUNITY)
Admission: RE | Disposition: A | Discharge status: Rehabilitation Facility | Payer: Self-pay | Source: Home / Self Care | Attending: Orthopedic Surgery

## 2019-10-07 ENCOUNTER — Inpatient Hospital Stay (HOSPITAL_COMMUNITY): Payer: BC Managed Care – PPO | Admitting: Vascular Surgery

## 2019-10-07 ENCOUNTER — Inpatient Hospital Stay (HOSPITAL_COMMUNITY): Payer: BC Managed Care – PPO

## 2019-10-07 DIAGNOSIS — Z7982 Long term (current) use of aspirin: Secondary | ICD-10-CM

## 2019-10-07 DIAGNOSIS — Z833 Family history of diabetes mellitus: Secondary | ICD-10-CM | POA: Diagnosis not present

## 2019-10-07 DIAGNOSIS — M48062 Spinal stenosis, lumbar region with neurogenic claudication: Principal | ICD-10-CM | POA: Diagnosis present

## 2019-10-07 DIAGNOSIS — M4186 Other forms of scoliosis, lumbar region: Secondary | ICD-10-CM | POA: Diagnosis not present

## 2019-10-07 DIAGNOSIS — M419 Scoliosis, unspecified: Secondary | ICD-10-CM | POA: Diagnosis not present

## 2019-10-07 DIAGNOSIS — Z6836 Body mass index (BMI) 36.0-36.9, adult: Secondary | ICD-10-CM | POA: Diagnosis not present

## 2019-10-07 DIAGNOSIS — M4327 Fusion of spine, lumbosacral region: Secondary | ICD-10-CM | POA: Diagnosis not present

## 2019-10-07 DIAGNOSIS — I10 Essential (primary) hypertension: Secondary | ICD-10-CM | POA: Diagnosis present

## 2019-10-07 DIAGNOSIS — M4326 Fusion of spine, lumbar region: Secondary | ICD-10-CM | POA: Diagnosis not present

## 2019-10-07 DIAGNOSIS — Z87891 Personal history of nicotine dependence: Secondary | ICD-10-CM | POA: Diagnosis not present

## 2019-10-07 DIAGNOSIS — E119 Type 2 diabetes mellitus without complications: Secondary | ICD-10-CM | POA: Diagnosis not present

## 2019-10-07 DIAGNOSIS — M7989 Other specified soft tissue disorders: Secondary | ICD-10-CM | POA: Diagnosis not present

## 2019-10-07 DIAGNOSIS — M5136 Other intervertebral disc degeneration, lumbar region: Secondary | ICD-10-CM | POA: Diagnosis not present

## 2019-10-07 DIAGNOSIS — M48061 Spinal stenosis, lumbar region without neurogenic claudication: Secondary | ICD-10-CM | POA: Diagnosis not present

## 2019-10-07 DIAGNOSIS — Z419 Encounter for procedure for purposes other than remedying health state, unspecified: Secondary | ICD-10-CM

## 2019-10-07 DIAGNOSIS — I82462 Acute embolism and thrombosis of left calf muscular vein: Secondary | ICD-10-CM | POA: Diagnosis not present

## 2019-10-07 DIAGNOSIS — Z7984 Long term (current) use of oral hypoglycemic drugs: Secondary | ICD-10-CM | POA: Diagnosis not present

## 2019-10-07 DIAGNOSIS — Z981 Arthrodesis status: Secondary | ICD-10-CM | POA: Diagnosis not present

## 2019-10-07 DIAGNOSIS — M5116 Intervertebral disc disorders with radiculopathy, lumbar region: Secondary | ICD-10-CM | POA: Diagnosis present

## 2019-10-07 DIAGNOSIS — Z4789 Encounter for other orthopedic aftercare: Secondary | ICD-10-CM | POA: Diagnosis not present

## 2019-10-07 DIAGNOSIS — I82461 Acute embolism and thrombosis of right calf muscular vein: Secondary | ICD-10-CM | POA: Diagnosis not present

## 2019-10-07 DIAGNOSIS — M5416 Radiculopathy, lumbar region: Secondary | ICD-10-CM | POA: Diagnosis not present

## 2019-10-07 DIAGNOSIS — G8918 Other acute postprocedural pain: Secondary | ICD-10-CM | POA: Diagnosis not present

## 2019-10-07 DIAGNOSIS — Z8249 Family history of ischemic heart disease and other diseases of the circulatory system: Secondary | ICD-10-CM | POA: Diagnosis not present

## 2019-10-07 DIAGNOSIS — Z96651 Presence of right artificial knee joint: Secondary | ICD-10-CM | POA: Diagnosis not present

## 2019-10-07 DIAGNOSIS — Z841 Family history of disorders of kidney and ureter: Secondary | ICD-10-CM | POA: Diagnosis not present

## 2019-10-07 HISTORY — PX: ANTERIOR LUMBAR FUSION: SHX1170

## 2019-10-07 HISTORY — PX: ABDOMINAL EXPOSURE: SHX5708

## 2019-10-07 LAB — GLUCOSE, CAPILLARY
Glucose-Capillary: 148 mg/dL — ABNORMAL HIGH (ref 70–99)
Glucose-Capillary: 168 mg/dL — ABNORMAL HIGH (ref 70–99)
Glucose-Capillary: 166 mg/dL — ABNORMAL HIGH (ref 70–99)
Glucose-Capillary: 258 mg/dL — ABNORMAL HIGH (ref 70–99)

## 2019-10-07 LAB — HEMOGLOBIN A1C
Hgb A1c MFr Bld: 7.4 % — ABNORMAL HIGH (ref 4.8–5.6)
Mean Plasma Glucose: 165.68 mg/dL

## 2019-10-07 SURGERY — ANTERIOR LUMBAR FUSION 1 LEVEL
Anesthesia: General

## 2019-10-07 MED ORDER — ONDANSETRON HCL 4 MG/2ML IJ SOLN: 4.0000 mg | Freq: Once | INTRAMUSCULAR | Status: DC | PRN

## 2019-10-07 MED ORDER — HEMOSTATIC AGENTS (NO CHARGE) OPTIME
TOPICAL | Status: DC | PRN
  Administered 2019-10-07 (×2): 1 via TOPICAL

## 2019-10-07 MED ORDER — CEFAZOLIN SODIUM-DEXTROSE 1-4 GM/50ML-% IV SOLN
1.0000 g | Freq: Three times a day (TID) | INTRAVENOUS | Status: AC
  Administered 2019-10-07 – 2019-10-08 (×2): 1 g via INTRAVENOUS
  Filled 2019-10-07 (×2): qty 50

## 2019-10-07 MED ORDER — METFORMIN HCL ER 500 MG PO TB24
1000.0000 mg | ORAL_TABLET | Freq: Every evening | ORAL | Status: DC
  Administered 2019-10-07 – 2019-10-11 (×5): 1000 mg via ORAL
  Filled 2019-10-07 (×7): qty 2

## 2019-10-07 MED ORDER — OXYCODONE HCL 5 MG/5ML PO SOLN: 5.0000 mg | Freq: Once | ORAL | Status: DC | PRN

## 2019-10-07 MED ORDER — MIDAZOLAM HCL 5 MG/5ML IJ SOLN
INTRAMUSCULAR | Status: DC | PRN
  Administered 2019-10-07: 2 mg via INTRAVENOUS

## 2019-10-07 MED ORDER — STERILE WATER FOR IRRIGATION IR SOLN
Status: DC | PRN
  Administered 2019-10-07: 1000 mL

## 2019-10-07 MED ORDER — DEXAMETHASONE SODIUM PHOSPHATE 4 MG/ML IJ SOLN
INTRAMUSCULAR | Status: DC | PRN
  Administered 2019-10-07: 4 mg via INTRAVENOUS

## 2019-10-07 MED ORDER — HYDROMORPHONE HCL 1 MG/ML IJ SOLN
0.2500 mg | INTRAMUSCULAR | Status: DC | PRN
  Administered 2019-10-07 (×2): 0.5 mg via INTRAVENOUS

## 2019-10-07 MED ORDER — TRANEXAMIC ACID 1000 MG/10ML IV SOLN
INTRAVENOUS | Status: DC | PRN
  Administered 2019-10-07: 09:00:00 1000 mg via INTRAVENOUS

## 2019-10-07 MED ORDER — HYDROMORPHONE HCL 1 MG/ML IJ SOLN
1.0000 mg | INTRAMUSCULAR | Status: DC | PRN
  Administered 2019-10-07 – 2019-10-10 (×3): 1 mg via INTRAVENOUS
  Filled 2019-10-07 (×3): qty 1

## 2019-10-07 MED ORDER — LIDOCAINE 2% (20 MG/ML) 5 ML SYRINGE
INTRAMUSCULAR | Status: DC | PRN
  Administered 2019-10-07: 100 mg via INTRAVENOUS

## 2019-10-07 MED ORDER — OXYCODONE HCL 5 MG PO TABS
10.0000 mg | ORAL_TABLET | ORAL | Status: DC | PRN
  Filled 2019-10-07: qty 2

## 2019-10-07 MED ORDER — FENTANYL CITRATE (PF) 100 MCG/2ML IJ SOLN
INTRAMUSCULAR | Status: DC | PRN
  Administered 2019-10-07 (×2): 50 ug via INTRAVENOUS
  Administered 2019-10-07: 100 ug via INTRAVENOUS
  Administered 2019-10-07: 50 ug via INTRAVENOUS

## 2019-10-07 MED ORDER — SODIUM CHLORIDE 0.9% FLUSH: 3.0000 mL | INTRAVENOUS | Status: DC | PRN

## 2019-10-07 MED ORDER — CEFAZOLIN SODIUM 1 G IJ SOLR
INTRAMUSCULAR | Status: AC
  Filled 2019-10-07: qty 20

## 2019-10-07 MED ORDER — METFORMIN HCL ER 500 MG PO TB24: 500.0000 mg | ORAL_TABLET | ORAL | Status: DC

## 2019-10-07 MED ORDER — LACTATED RINGERS IV SOLN: INTRAVENOUS | Status: DC

## 2019-10-07 MED ORDER — SODIUM CHLORIDE 0.9% FLUSH
3.0000 mL | Freq: Two times a day (BID) | INTRAVENOUS | Status: DC
  Administered 2019-10-07 – 2019-10-10 (×5): 3 mL via INTRAVENOUS

## 2019-10-07 MED ORDER — POLYETHYLENE GLYCOL 3350 17 G PO PACK
17.0000 g | PACK | Freq: Every day | ORAL | Status: DC | PRN
  Administered 2019-10-10: 17 g via ORAL
  Filled 2019-10-07: qty 1

## 2019-10-07 MED ORDER — ACETAMINOPHEN 650 MG RE SUPP: 650.0000 mg | RECTAL | Status: DC | PRN

## 2019-10-07 MED ORDER — HYDROMORPHONE HCL 1 MG/ML IJ SOLN
INTRAMUSCULAR | Status: AC
  Filled 2019-10-07: qty 0.5

## 2019-10-07 MED ORDER — THROMBIN (RECOMBINANT) 20000 UNITS EX SOLR
CUTANEOUS | Status: AC
  Filled 2019-10-07: qty 20000

## 2019-10-07 MED ORDER — PROPOFOL 1000 MG/100ML IV EMUL
INTRAVENOUS | Status: AC
  Filled 2019-10-07: qty 100

## 2019-10-07 MED ORDER — SODIUM CHLORIDE 0.9 % IV SOLN
INTRAVENOUS | Status: DC | PRN
  Administered 2019-10-07: 25 ug/min via INTRAVENOUS

## 2019-10-07 MED ORDER — SODIUM CHLORIDE 0.9 % IV SOLN
250.0000 mL | INTRAVENOUS | Status: DC
  Administered 2019-10-07: 250 mL via INTRAVENOUS

## 2019-10-07 MED ORDER — CHLORHEXIDINE GLUCONATE 4 % EX LIQD: 60.0000 mL | Freq: Once | CUTANEOUS | Status: DC

## 2019-10-07 MED ORDER — LACTATED RINGERS IV SOLN
INTRAVENOUS | Status: DC | PRN
  Administered 2019-10-07: 08:00:00 via INTRAVENOUS

## 2019-10-07 MED ORDER — MENTHOL 3 MG MT LOZG: 1.0000 | LOZENGE | OROMUCOSAL | Status: DC | PRN

## 2019-10-07 MED ORDER — HYDROMORPHONE HCL 1 MG/ML IJ SOLN
INTRAMUSCULAR | Status: AC
  Administered 2019-10-07: 1 mg via INTRAVENOUS
  Filled 2019-10-07: qty 1

## 2019-10-07 MED ORDER — ACETAMINOPHEN 325 MG PO TABS
650.0000 mg | ORAL_TABLET | ORAL | Status: DC | PRN
  Administered 2019-10-10 – 2019-10-11 (×2): 650 mg via ORAL
  Filled 2019-10-07 (×2): qty 2

## 2019-10-07 MED ORDER — ONDANSETRON HCL 4 MG PO TABS: 4.0000 mg | ORAL_TABLET | Freq: Four times a day (QID) | ORAL | Status: DC | PRN

## 2019-10-07 MED ORDER — HEPARIN SOD (PORK) LOCK FLUSH 100 UNIT/ML IV SOLN
INTRAVENOUS | Status: DC | PRN
  Administered 2019-10-07: 500 [IU] via INTRAVENOUS

## 2019-10-07 MED ORDER — BISOPROLOL-HYDROCHLOROTHIAZIDE 5-6.25 MG PO TABS
1.0000 | ORAL_TABLET | Freq: Every day | ORAL | Status: DC
  Administered 2019-10-07 – 2019-10-12 (×5): 1 via ORAL
  Filled 2019-10-07 (×7): qty 1

## 2019-10-07 MED ORDER — METFORMIN HCL ER 500 MG PO TB24
500.0000 mg | ORAL_TABLET | Freq: Every day | ORAL | Status: DC
  Administered 2019-10-09 – 2019-10-12 (×4): 500 mg via ORAL
  Filled 2019-10-07 (×5): qty 1

## 2019-10-07 MED ORDER — SUGAMMADEX SODIUM 500 MG/5ML IV SOLN
INTRAVENOUS | Status: DC | PRN
  Administered 2019-10-07: 250 mg via INTRAVENOUS

## 2019-10-07 MED ORDER — FENTANYL CITRATE (PF) 250 MCG/5ML IJ SOLN
INTRAMUSCULAR | Status: AC
  Filled 2019-10-07: qty 5

## 2019-10-07 MED ORDER — PROPOFOL 500 MG/50ML IV EMUL: INTRAVENOUS | Status: DC | PRN

## 2019-10-07 MED ORDER — 0.9 % SODIUM CHLORIDE (POUR BTL) OPTIME
TOPICAL | Status: DC | PRN
  Administered 2019-10-07 (×2): 1000 mL

## 2019-10-07 MED ORDER — INSULIN ASPART 100 UNIT/ML ~~LOC~~ SOLN
0.0000 [IU] | Freq: Every day | SUBCUTANEOUS | Status: DC
  Administered 2019-10-07: 3 [IU] via SUBCUTANEOUS
  Administered 2019-10-10: 2 [IU] via SUBCUTANEOUS

## 2019-10-07 MED ORDER — INSULIN ASPART 100 UNIT/ML ~~LOC~~ SOLN
0.0000 [IU] | Freq: Three times a day (TID) | SUBCUTANEOUS | Status: DC
  Administered 2019-10-07: 3 [IU] via SUBCUTANEOUS
  Administered 2019-10-08: 5 [IU] via SUBCUTANEOUS
  Administered 2019-10-09: 3 [IU] via SUBCUTANEOUS
  Administered 2019-10-09: 5 [IU] via SUBCUTANEOUS
  Administered 2019-10-09 – 2019-10-11 (×5): 3 [IU] via SUBCUTANEOUS
  Administered 2019-10-11: 5 [IU] via SUBCUTANEOUS
  Administered 2019-10-11: 3 [IU] via SUBCUTANEOUS
  Administered 2019-10-12: 5 [IU] via SUBCUTANEOUS
  Administered 2019-10-12: 3 [IU] via SUBCUTANEOUS

## 2019-10-07 MED ORDER — ROCURONIUM BROMIDE 100 MG/10ML IV SOLN
INTRAVENOUS | Status: DC | PRN
  Administered 2019-10-07: 80 mg via INTRAVENOUS

## 2019-10-07 MED ORDER — CEFAZOLIN SODIUM-DEXTROSE 2-4 GM/100ML-% IV SOLN
INTRAVENOUS | Status: AC
  Filled 2019-10-07: qty 100

## 2019-10-07 MED ORDER — PROPOFOL 10 MG/ML IV BOLUS
INTRAVENOUS | Status: DC | PRN
  Administered 2019-10-07: 170 mg via INTRAVENOUS

## 2019-10-07 MED ORDER — ONDANSETRON HCL 4 MG/2ML IJ SOLN: 4.0000 mg | Freq: Four times a day (QID) | INTRAMUSCULAR | Status: DC | PRN

## 2019-10-07 MED ORDER — CEFAZOLIN SODIUM-DEXTROSE 2-4 GM/100ML-% IV SOLN
2.0000 g | INTRAVENOUS | Status: AC
  Administered 2019-10-07 (×2): 2 g via INTRAVENOUS

## 2019-10-07 MED ORDER — CEFAZOLIN SODIUM-DEXTROSE 1-4 GM/50ML-% IV SOLN
1.0000 g | Freq: Three times a day (TID) | INTRAVENOUS | Status: DC
  Filled 2019-10-07: qty 50

## 2019-10-07 MED ORDER — ACETAMINOPHEN 10 MG/ML IV SOLN
INTRAVENOUS | Status: AC
  Filled 2019-10-07: qty 100

## 2019-10-07 MED ORDER — BUPIVACAINE-EPINEPHRINE (PF) 0.25% -1:200000 IJ SOLN
INTRAMUSCULAR | Status: AC
  Filled 2019-10-07: qty 30

## 2019-10-07 MED ORDER — ACETAMINOPHEN 10 MG/ML IV SOLN
INTRAVENOUS | Status: DC | PRN
  Administered 2019-10-07: 1000 mg via INTRAVENOUS

## 2019-10-07 MED ORDER — OXYCODONE HCL 5 MG PO TABS
5.0000 mg | ORAL_TABLET | ORAL | Status: DC | PRN
  Administered 2019-10-09: 5 mg via ORAL
  Filled 2019-10-07: qty 1

## 2019-10-07 MED ORDER — MIDAZOLAM HCL 2 MG/2ML IJ SOLN
INTRAMUSCULAR | Status: AC
  Filled 2019-10-07: qty 2

## 2019-10-07 MED ORDER — PHENOL 1.4 % MT LIQD: 1.0000 | OROMUCOSAL | Status: DC | PRN

## 2019-10-07 MED ORDER — TRANEXAMIC ACID-NACL 1000-0.7 MG/100ML-% IV SOLN
INTRAVENOUS | Status: AC
  Filled 2019-10-07: qty 100

## 2019-10-07 MED ORDER — LACTATED RINGERS IV SOLN
INTRAVENOUS | Status: DC | PRN
  Administered 2019-10-07 (×3): via INTRAVENOUS

## 2019-10-07 MED ORDER — BUPIVACAINE LIPOSOME 1.3 % IJ SUSP
INTRAMUSCULAR | Status: DC | PRN
  Administered 2019-10-07: 10 mL

## 2019-10-07 MED ORDER — ONDANSETRON HCL 4 MG/2ML IJ SOLN
INTRAMUSCULAR | Status: DC | PRN
  Administered 2019-10-07: 4 mg via INTRAVENOUS

## 2019-10-07 MED ORDER — PROPOFOL 500 MG/50ML IV EMUL
INTRAVENOUS | Status: DC | PRN
  Administered 2019-10-07: 100 ug/kg/min via INTRAVENOUS

## 2019-10-07 MED ORDER — METHOCARBAMOL 1000 MG/10ML IJ SOLN
500.0000 mg | Freq: Four times a day (QID) | INTRAVENOUS | Status: DC | PRN
  Filled 2019-10-07: qty 5

## 2019-10-07 MED ORDER — PROPOFOL 10 MG/ML IV BOLUS
INTRAVENOUS | Status: AC
  Filled 2019-10-07: qty 40

## 2019-10-07 MED ORDER — HEPARIN SOD (PORK) LOCK FLUSH 100 UNIT/ML IV SOLN
INTRAVENOUS | Status: AC
  Filled 2019-10-07: qty 5

## 2019-10-07 MED ORDER — HEPARIN SODIUM (PORCINE) 1000 UNIT/ML IJ SOLN
INTRAMUSCULAR | Status: AC
  Filled 2019-10-07: qty 1

## 2019-10-07 MED ORDER — GABAPENTIN 300 MG PO CAPS
300.0000 mg | ORAL_CAPSULE | Freq: Three times a day (TID) | ORAL | Status: DC
  Administered 2019-10-07 – 2019-10-12 (×14): 300 mg via ORAL
  Filled 2019-10-07 (×14): qty 1

## 2019-10-07 MED ORDER — BUPIVACAINE HCL (PF) 0.5 % IJ SOLN
INTRAMUSCULAR | Status: DC | PRN
  Administered 2019-10-07: 15 mL via PERINEURAL

## 2019-10-07 MED ORDER — BUPIVACAINE-EPINEPHRINE (PF) 0.25% -1:200000 IJ SOLN
INTRAMUSCULAR | Status: DC | PRN
  Administered 2019-10-07: 10 mL

## 2019-10-07 MED ORDER — OXYCODONE HCL 5 MG PO TABS: 5.0000 mg | ORAL_TABLET | Freq: Once | ORAL | Status: DC | PRN

## 2019-10-07 MED ORDER — FLEET ENEMA 7-19 GM/118ML RE ENEM: 1.0000 | ENEMA | Freq: Once | RECTAL | Status: DC | PRN

## 2019-10-07 MED ORDER — SODIUM CHLORIDE (PF) 0.9 % IJ SOLN
INTRAMUSCULAR | Status: DC | PRN
  Administered 2019-10-07: 5 mL

## 2019-10-07 MED ORDER — METHOCARBAMOL 500 MG PO TABS
500.0000 mg | ORAL_TABLET | Freq: Four times a day (QID) | ORAL | Status: DC | PRN
  Administered 2019-10-08 – 2019-10-11 (×6): 500 mg via ORAL
  Filled 2019-10-07 (×7): qty 1

## 2019-10-07 SURGICAL SUPPLY — 110 items
APPLIER CLIP 11 MED OPEN (CLIP) ×6
BLADE CLIPPER SURG (BLADE) IMPLANT
BLADE SURG 10 STRL SS (BLADE) ×3 IMPLANT
CABLE BIPOLOR RESECTION CORD (MISCELLANEOUS) ×3 IMPLANT
CATH FOLEY 2WAY SLVR  5CC 16FR (CATHETERS) ×2
CATH FOLEY 2WAY SLVR 5CC 16FR (CATHETERS) ×1 IMPLANT
CLIP APPLIE 11 MED OPEN (CLIP) ×2 IMPLANT
CLIP LIGATING EXTRA MED SLVR (CLIP) ×3 IMPLANT
CLIP LIGATING EXTRA SM BLUE (MISCELLANEOUS) ×3 IMPLANT
CLIP NEUROVISION LG (CLIP) ×3 IMPLANT
CLOSURE STERI-STRIP 1/2X4 (GAUZE/BANDAGES/DRESSINGS) ×2
CLSR STERI-STRIP ANTIMIC 1/2X4 (GAUZE/BANDAGES/DRESSINGS) ×4 IMPLANT
COVER SURGICAL LIGHT HANDLE (MISCELLANEOUS) ×3 IMPLANT
COVER WAND RF STERILE (DRAPES) ×6 IMPLANT
DERMABOND ADVANCED (GAUZE/BANDAGES/DRESSINGS) ×2
DERMABOND ADVANCED .7 DNX12 (GAUZE/BANDAGES/DRESSINGS) ×1 IMPLANT
DISSECTOR BLUNT TIP ENDO 5MM (MISCELLANEOUS) ×6 IMPLANT
DRAPE C-ARM 42X72 X-RAY (DRAPES) ×6 IMPLANT
DRAPE C-ARMOR (DRAPES) ×3 IMPLANT
DRAPE POUCH INSTRU U-SHP 10X18 (DRAPES) ×3 IMPLANT
DRAPE SURG 17X23 STRL (DRAPES) ×3 IMPLANT
DRAPE U-SHAPE 47X51 STRL (DRAPES) ×3 IMPLANT
DRSG OPSITE POSTOP 4X8 (GAUZE/BANDAGES/DRESSINGS) ×6 IMPLANT
DURAPREP 26ML APPLICATOR (WOUND CARE) ×3 IMPLANT
ELECT BLADE 4.0 EZ CLEAN MEGAD (MISCELLANEOUS) ×3
ELECT CAUTERY BLADE 6.4 (BLADE) ×3 IMPLANT
ELECT PENCIL ROCKER SW 15FT (MISCELLANEOUS) ×3 IMPLANT
ELECT REM PT RETURN 9FT ADLT (ELECTROSURGICAL) ×3
ELECTRODE BLDE 4.0 EZ CLN MEGD (MISCELLANEOUS) ×1 IMPLANT
ELECTRODE REM PT RTRN 9FT ADLT (ELECTROSURGICAL) ×1 IMPLANT
GAUZE 4X4 16PLY RFD (DISPOSABLE) IMPLANT
GLOVE BIO SURGEON STRL SZ 6.5 (GLOVE) ×2 IMPLANT
GLOVE BIO SURGEON STRL SZ7.5 (GLOVE) IMPLANT
GLOVE BIO SURGEONS STRL SZ 6.5 (GLOVE) ×1
GLOVE BIOGEL PI IND STRL 6.5 (GLOVE) ×1 IMPLANT
GLOVE BIOGEL PI IND STRL 8.5 (GLOVE) ×2 IMPLANT
GLOVE BIOGEL PI INDICATOR 6.5 (GLOVE) ×2
GLOVE BIOGEL PI INDICATOR 8.5 (GLOVE) ×4
GLOVE SS BIOGEL STRL SZ 7.5 (GLOVE) ×1 IMPLANT
GLOVE SS BIOGEL STRL SZ 8.5 (GLOVE) ×2 IMPLANT
GLOVE SUPERSENSE BIOGEL SZ 7.5 (GLOVE) ×2
GLOVE SUPERSENSE BIOGEL SZ 8.5 (GLOVE) ×4
GOWN STRL REUS W/ TWL LRG LVL3 (GOWN DISPOSABLE) ×2 IMPLANT
GOWN STRL REUS W/TWL 2XL LVL3 (GOWN DISPOSABLE) ×9 IMPLANT
GOWN STRL REUS W/TWL LRG LVL3 (GOWN DISPOSABLE) ×4
GUIDEWIRE NITINOL BEVEL TIP (WIRE) ×3 IMPLANT
HEMOSTAT SNOW SURGICEL 2X4 (HEMOSTASIS) IMPLANT
HEMOSTAT SURGICEL 2X14 (HEMOSTASIS) ×3 IMPLANT
INSERT FOGARTY 61MM (MISCELLANEOUS) IMPLANT
INSERT FOGARTY SM (MISCELLANEOUS) IMPLANT
KIT BASIN OR (CUSTOM PROCEDURE TRAY) ×3 IMPLANT
KIT BONE MRW ASP ANGEL CPRP (KITS) ×3 IMPLANT
KIT DILATOR XLIF 5 (KITS) ×1 IMPLANT
KIT SURGICAL ACCESS MAXCESS 4 (KITS) ×3 IMPLANT
KIT TURNOVER KIT B (KITS) ×3 IMPLANT
KIT XLIF (KITS) ×2
LIGHT SOURCE STRAIGHT TIP (INSTRUMENTS) ×6 IMPLANT
LOOP VESSEL MAXI BLUE (MISCELLANEOUS) IMPLANT
LOOP VESSEL MINI RED (MISCELLANEOUS) IMPLANT
MATRIX HEMOSTAT SURGIFLO (HEMOSTASIS) ×6 IMPLANT
MODULE EMG NEEDLE SSEP NVM5 (NEEDLE) ×3 IMPLANT
MODULE NVM5 NEXT GEN EMG (NEEDLE) ×3 IMPLANT
MODULUS XLW 12X22X60MM 10 (Spine Construct) ×3 IMPLANT
NEEDLE SPNL 18GX3.5 QUINCKE PK (NEEDLE) ×3 IMPLANT
NS IRRIG 1000ML POUR BTL (IV SOLUTION) ×3 IMPLANT
PACK LAMINECTOMY ORTHO (CUSTOM PROCEDURE TRAY) ×3 IMPLANT
PACK UNIVERSAL I (CUSTOM PROCEDURE TRAY) ×3 IMPLANT
PAD ARMBOARD 7.5X6 YLW CONV (MISCELLANEOUS) ×12 IMPLANT
PROBE BALL TIP NVM5 SNG USE (BALLOONS) ×3 IMPLANT
PUTTY DBM ALLOSYNC PURE 10CC (Putty) ×6 IMPLANT
PUTTY DBM ALLOSYNC PURE 5CC (Putty) ×6 IMPLANT
PUTTY DBX 2.5CC (Putty) ×3 IMPLANT
PUTTY DBX 2.5CC DEPUY (Putty) ×1 IMPLANT
SPACER OLIF25 20 12D 12X60 (Spacer) ×3 IMPLANT
SPONGE INTESTINAL PEANUT (DISPOSABLE) ×9 IMPLANT
SPONGE LAP 18X18 RF (DISPOSABLE) IMPLANT
SPONGE LAP 4X18 RFD (DISPOSABLE) IMPLANT
SPONGE SURGIFOAM ABS GEL 100 (HEMOSTASIS) IMPLANT
STAPLER VISISTAT 35W (STAPLE) IMPLANT
SURGIFLO W/THROMBIN 8M KIT (HEMOSTASIS) ×3 IMPLANT
SUT BONE WAX W31G (SUTURE) ×3 IMPLANT
SUT MON AB 3-0 SH 27 (SUTURE) ×2
SUT MON AB 3-0 SH27 (SUTURE) ×1 IMPLANT
SUT PDS AB 1 CTX 36 (SUTURE) ×3 IMPLANT
SUT PROLENE 4 0 RB 1 (SUTURE)
SUT PROLENE 4-0 RB1 .5 CRCL 36 (SUTURE) IMPLANT
SUT PROLENE 5 0 C 1 24 (SUTURE) IMPLANT
SUT PROLENE 5 0 CC1 (SUTURE) IMPLANT
SUT PROLENE 6 0 C 1 30 (SUTURE) ×3 IMPLANT
SUT PROLENE 6 0 CC (SUTURE) IMPLANT
SUT SILK 0 TIES 10X30 (SUTURE) ×3 IMPLANT
SUT SILK 2 0 TIES 10X30 (SUTURE) ×6 IMPLANT
SUT SILK 2 0SH CR/8 30 (SUTURE) IMPLANT
SUT SILK 3 0 TIES 10X30 (SUTURE) ×6 IMPLANT
SUT SILK 3 0SH CR/8 30 (SUTURE) IMPLANT
SUT VIC AB 1 CT1 18XCR BRD 8 (SUTURE) ×1 IMPLANT
SUT VIC AB 1 CT1 27 (SUTURE) ×8
SUT VIC AB 1 CT1 27XBRD ANBCTR (SUTURE) ×4 IMPLANT
SUT VIC AB 1 CT1 8-18 (SUTURE) ×2
SUT VIC AB 1 CTX 36 (SUTURE) ×2
SUT VIC AB 1 CTX36XBRD ANBCTR (SUTURE) ×1 IMPLANT
SUT VIC AB 2-0 CT1 18 (SUTURE) ×6 IMPLANT
SYR BULB IRRIGATION 50ML (SYRINGE) ×3 IMPLANT
TOWEL GREEN STERILE (TOWEL DISPOSABLE) ×6 IMPLANT
TOWEL GREEN STERILE FF (TOWEL DISPOSABLE) ×3 IMPLANT
TRAP SPECIMEN MUCOUS 40CC (MISCELLANEOUS) ×3 IMPLANT
TUBE CONNECTING 12'X1/4 (SUCTIONS) ×1
TUBE CONNECTING 12X1/4 (SUCTIONS) ×2 IMPLANT
WATER STERILE IRR 1000ML POUR (IV SOLUTION) ×3 IMPLANT
YANKAUER SUCT BULB TIP NO VENT (SUCTIONS) ×3 IMPLANT

## 2019-10-07 NOTE — Op Note (Signed)
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    Postoperative diagnosis: Same  Operative procedure 1.  Lateral interbody fusion (XLIF) L3-4    2.  Oblique lumbar interbody fusion (OLIF): L4-5  Approach surgeon for the L4-5 level: Dr. Sherren Mocha Early  First assistant Estill Bamberg Ward, PA  Complications: None  Implants: 1.  NuVasive titanium intervertebral cage: 12 x 22 x 60.  10 degree lordosis.  L3-4 level.      2.  Medtronic Pivox intervertebral cage: 12 x 60 x 12 degree lordosis.  L4-5 level  Allograft: Allosync Pure (30cc). DBX (2.5cc)  No adverse activity with free running EMG and SSEP neuro monitoring.  Indications: Aaron Burns is a very pleasant 63 year old gentleman with severe back buttock and bilateral neuropathic leg pain.  Despite appropriate conservative management his quality of life continued to suffer and elected to move forward with surgery.  To address this pathology we elected to do a 2 phase surgery.  Today was the anterior interbody fixation and tomorrow will be knee posterior supplemental fixation.  All appropriate risks benefits and alternatives were discussed with the patient and consent was obtained.  Operative report: Patient was brought the operating placed on the operating room table.  After successful induction of general anesthesia and endotracheal ovation teds SCDs and a Foley were inserted.  The neuro monitoring rep then placed all the appropriate needles and pads for monitoring.  Patient was turned into the lateral decubitus position left side up.  All bony prominences were well-padded and the abdomen and left flank and posterior lumbar spine were all prepped and draped in a standard fashion.  Prior to this using x-ray we did properly positioned the patient and secured him directly to the table with a significant amount of tape.  With the prep and drape  complete and the patient secured to the table timeout was done to confirm patient procedure and all other important data.  At this point time Dr. Donnetta Hutching scrubbed and and performed a standard anterior oblique retroperitoneal approach to the L4-5 level.  Once he placed all the retractors he then scrubbed out and I scrubbed in.  Once we confirmed that we are at the appropriate level using fluoroscopy and annulotomy was performed with a 10 blade scalpel.  Using a Cobb elevator I advanced across the disc space down to the contralateral annulus.  I confirmed satisfactory position in the AP and lateral planes and then advanced the Cobb elevator across the contralateral annulus.  Once this was released I then used my box osteotome to remove the bulk of the disc material.  I again confirmed satisfactory position of this box osteotome as I was going across the disc space.  I then used curettes and Kerrison rongeurs to remove the bulk of the disc material and make sure I had all the cartilaginous endplate removed.  Once I had a complete and thorough discectomy with bleeding subchondral bone I then used my trial devices.  I elected to use the size 12 spacer as it provided the best overall fit and indirect foraminal decompression.  The implant was obtained and packed with the allograft.  I then directly malleted it into the disc space using fluoroscopy for guidance.  Once I added active final position I confirmed in both planes that the implant was properly positioned.  The implant itself was in the posterior third of the vertebral body but remained with  in the intervertebral space.  There was no evidence to suggest positioning.  At this point the inserting device was removed and I irrigated the wound copiously normal saline.  I sequentially remove the retractors and confirmed that hemostasis.  I then placed some FloSeal to aid in overall hemostasis.  At this point the fascia of the external oblique was identified and closed  with interrupted #1 Vicryl sutures.  I then closed superficially with #1 Vicryl suture and 2-0 Vicryl suture.  The skin was reapproximated with 3-0 Monocryl.  Steri-Strips and a dry dressing were applied.  At the L4-5 level complete we turned our attention to the lateral interbody fusion at L3-4.  Fluoroscopy was used to identify the L3-4 disc space which was marked out.  I anesthetized the incision site with quarter percent Marcaine with epinephrine and then made a transverse incision centered over that L3-4 disc space I sharply dissected down to the fascia of the external oblique.  A small counterincision was made 1 fingerbreadth posteriorly I bluntly dissected down to the posterior aspect of the retroperitoneal fascia I advanced into the retroperitoneum and then using my finger bluntly dissected through the undersurface of the oblique muscle until I could see my finger in the initial incision site.  I then placed the initial dilator down to the surface of the psoas muscle.  I then sequentially stimulated to ensure that I was not injuring or traumatizing the lumbar plexus.  I then advanced the dilator through the psoas down to the lateral aspect of the disc space.  I confirmed I was at the midportion of the L3-4 disc space.  Using fluoroscopy identified minus position and then secured it with the cannulated guidepin.  I then sequentially stimulated confirming no adverse EMG or SSEP activity.  I then dilated with the second and third dilator.  With each time I stimulated circumferentially confirming no adverse activity.  I then obtained the working retractor and placed it over the final dilator.  I then stimulated posterior to the blade and there was no adverse activity.  I removed the inner dilators and then manually pulled my retractor posteriorly until I was in the posterior third of the disc space.  With the retractor secured in place I then stimulated circumferentially behind each of the blades confirming  that the lumbar plexus was not being traumatized.  I then placed my trocar to lock the blade in place.  I confirmed satisfactory positioning of my retractor in both the AP and lateral planes.  Cobb elevator was used to disrupt the overlying osteophyte/exostosis and it was removed with double-action Leksell rongeur.  I then incised the annulus and used my Cobb elevator to advance through the disc along the endplate through to the contralateral side.  I released the contralateral annulus.  Once this was done I was able to use my box osteotomes to remove the bulk of the disc material.  I then used my pituitary rongeurs curettes and Kerrison rongeurs to remove all the remaining disc material so I had bleeding subchondral bone.  Once this was complete I then used my trial spacers to dilate at the space.  The size 12 x 22 spacer provided the best overall fit.  Both the AP and lateral planes were satisfactory with this trial spacer.  The implant was obtained and packed with the allograft.  I made sure I bleeding subchondral bone and I irrigated out the wound copiously with normal saline.  The final implant was then [facility]  to the appropriate depth.  X-rays demonstrated satisfactory position of both the L3-4 and the L4-5 intervertebral cages.  I then irrigated the lateral incision one last time using bipolar cautery and FloSeal obtained and maintain hemostasis.  The retractors were removed and the fascia of the external oblique was closed with interrupted #1 Vicryl suture.  I then closed with 2-0 Vicryl suture and 3-0 Monocryl for the skin.  The second small incision was also irrigated out and closed in a double layer layered fashion.  Steri-Strips and dry dressings were applied to all 3 incisions and the patient was ultimately extubated transfer the PACU without incident.  The end of the case all needle sponge counts were correct.  There were no adverse intraoperative events.  Patient will return to the OR tomorrow  morning for posterior supplemental fixation and if required decompression.

## 2019-10-07 NOTE — Anesthesia Procedure Notes (Signed)
Anesthesia Regional Block: TAP block   Pre-Anesthetic Checklist: ,, timeout performed, Correct Patient, Correct Site, Correct Laterality, Correct Procedure, Correct Position, site marked, Risks and benefits discussed,  Surgical consent,  Pre-op evaluation,  At surgeon's request and post-op pain management  Laterality: Left  Prep: chloraprep       Needles:  Injection technique: Single-shot  Needle Type: Echogenic Stimulator Needle     Needle Length: 10cm  Needle Gauge: 21     Additional Needles:   Procedures:,,,, ultrasound used (permanent image in chart),,,,  Narrative:  Start time: 10/07/2019 7:25 AM End time: 10/07/2019 7:29 AM Injection made incrementally with aspirations every 5 mL.  Performed by: Personally  Anesthesiologist: Lidia Collum, MD  Additional Notes: Monitors applied. Injection made in 5cc increments. No resistance to injection. Good needle visualization. Patient tolerated procedure well.

## 2019-10-07 NOTE — H&P (Signed)
Addendum H&P  Patient presents today for definitive surgical management of his severe low back buttock and radicular leg pain.  There has been no change in his clinical exam since his last office visit of 10/01/2019.  Continues to have significant back buttock and bilateral radicular leg pain left side worse than the right.  Despite appropriate conservative management his quality of life continues to deteriorate.  I have gone over the surgical plan with him and addressed all of his questions and concerns.  We have also reviewed the risks and benefits of surgery and he has expressed a desire to move forward with surgery.  This is a planned 2-day surgical procedure.  Phase 1: Oblique lumbar interbody fusion L4-5 followed by a lateral L3-4 interbody fusion.  Phase 2: Posterior spinal fusion with pedicle screw instrumentation L3-L5, with possible decompression depending upon if the indirect foraminal and lateral recess stenosis achieved with the interbody fixation is not adequate enough.

## 2019-10-07 NOTE — Anesthesia Procedure Notes (Signed)
Procedure Name: Intubation Date/Time: 10/07/2019 7:46 AM Performed by: Griffin Dakin, CRNA Pre-anesthesia Checklist: Patient identified, Emergency Drugs available, Suction available and Patient being monitored Patient Re-evaluated:Patient Re-evaluated prior to induction Oxygen Delivery Method: Circle system utilized Preoxygenation: Pre-oxygenation with 100% oxygen Induction Type: IV induction Ventilation: Mask ventilation without difficulty Tube type: Oral Tube size: 7.5 mm Number of attempts: 1 Airway Equipment and Method: Stylet and Oral airway Placement Confirmation: ETT inserted through vocal cords under direct vision,  positive ETCO2 and breath sounds checked- equal and bilateral Secured at: 21 cm Tube secured with: Tape Dental Injury: Teeth and Oropharynx as per pre-operative assessment

## 2019-10-07 NOTE — Op Note (Signed)
    OPERATIVE REPORT  DATE OF SURGERY: 10/07/2019  PATIENT: Aaron Burns, 63 y.o. male MRN: [id number]  DOB: [birth year]  PRE-OPERATIVE DIAGNOSIS: Degenerative disc disease  POST-OPERATIVE DIAGNOSIS:  Same  PROCEDURE: Oblique exposure for L4-5 disc fusion  SURGEON:  Curt Jews, M.D.  Co-surgeon for the exposure Dr. Rolena Infante  ANESTHESIA: General  EBL: per anesthesia record  Total I/O In: -  Out: 550 [Urine:400; Blood:150]  BLOOD ADMINISTERED: none  DRAINS: none  SPECIMEN: none  COUNTS CORRECT:  YES  PATIENT DISPOSITION:  PACU - hemodynamically stable  PROCEDURE DETAILS: Patient was taken up and placed supine position where endotracheal anesthesia was administered.  The patient was then placed on the right side down lateral position.  The patient was positioned for adequate exposure for both the oblique repair of 4 5 and the more proximal lateral repair with Dr. Rolena Infante.  C-arm was used identify the level of the L4-5 disc in relationship to the anterior abdominal wall.  Incision was made over this level proximally 2 fingerbreadths below the anterior superior iliac spine.  This was Carried down through this cutaneous fat to the external oblique fascia.  The muscle was divided bluntly and the retroperitoneal space was entered.  Blunt dissection was used to continue to mobilize intraperitoneal contents to the right.  The area above the psoas muscle was entered and blunt dissection was continued above the psoas muscle down to the level of the spine.  The vascular structures were mobilized to the midline.  The psoas muscle was mobilized laterally.  The Thompson retractor was brought onto the field and the blades were positioned to retract the psoas to the left and the vascular structures to the right.  Spinal needle was placed in the L4-5 disc and C-arm was brought back onto the field to confirm that this was the appropriate location.  Adequate exposure was allowed for the discectomy and  fusion.  The remainder the procedure will be dictated as a separate note by Dr. Lynnea Ferrier, M.D., [facility] 10/07/2019 11:00 AM

## 2019-10-07 NOTE — Progress Notes (Signed)
Pt arrived on floor, transferred from PACU. Patient is A&OX4 and in no acute distress. Pt states pain is 6/10, with no complaints of nausea. Honeycomb dsg to L abdominal/flank area.   SCD's placed on patient lower extremieties & turned on. IS provided to patient and patient demonstrated usage of IS. Patient orienated to room and unit. Bedside table and personal belongings within reach of patient. Patient educated on usage of nurse call bell, placed within reach of patient. Patient instructed to call for assistance. Patient in bed. Will continue to monitor.

## 2019-10-07 NOTE — Brief Op Note (Signed)
10/07/2019  2:15 PM  PATIENT:  Aaron Burns  63 y.o. 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  PROCEDURE:  Procedure(s) with comments: Oblique lateral interbody fusion LUMBAR FOUR-FIVE, anterior lateral interbody fusion LUMBAR THREE-FOUR (N/A) - TAB BLOCK WITH EXPAREL ABDOMINAL EXPOSURE (N/A)  SURGEON:  Surgeon(s) and Role: Panel 1:    Melina Schools, MD - Primary Panel 2:    * Early, Arvilla Meres, MD - Primary  PHYSICIAN ASSISTANT:   ASSISTANTS: Amanda Ward   ANESTHESIA:   none  EBL:  250 mL   BLOOD ADMINISTERED:none  DRAINS: none   LOCAL MEDICATIONS USED:  MARCAINE     SPECIMEN:  No Specimen  DISPOSITION OF SPECIMEN:  N/A  COUNTS:  YES  TOURNIQUET:  * No tourniquets in log *  DICTATION: .Dragon Dictation  PLAN OF CARE: Admit to inpatient   PATIENT DISPOSITION:  PACU - hemodynamically stable.

## 2019-10-07 NOTE — Transfer of Care (Signed)
Immediate Anesthesia Transfer of Care Note  Patient: Ascher Bertrand  Procedure(s) Performed: Oblique lateral interbody fusion LUMBAR FOUR-FIVE, anterior lateral interbody fusion LUMBAR THREE-FOUR (N/A ) ABDOMINAL EXPOSURE (N/A )  Patient Location: PACU  Anesthesia Type:General  Level of Consciousness: drowsy  Airway & Oxygen Therapy: Patient Spontanous Breathing and Patient connected to nasal cannula oxygen  Post-op Assessment: Report given to RN and Post -op Vital signs reviewed and stable  Post vital signs: Reviewed and stable  Last Vitals:  Vitals Value Taken Time  BP 144/76 10/07/19 1317  Temp    Pulse 87 10/07/19 1321  Resp 23 10/07/19 1321  SpO2 99 % 10/07/19 1321  Vitals shown include unvalidated device data.  Last Pain:  Vitals:   10/07/19 0609  PainSc: 4       Patients Stated Pain Goal: 4 ([id number])  Complications: No apparent anesthesia complications

## 2019-10-07 NOTE — Progress Notes (Signed)
Orthopedic Tech Progress Note Patient Details:  Aaron Burns Apr 23, 1956 [id number] rn states that patient wife has his brace. Patient ID: Aaron Burns, male   DOB: [birth year], 63 y.o.   MRN: [id number]   Aaron Burns 10/07/2019, 3:48 PM

## 2019-10-08 ENCOUNTER — Inpatient Hospital Stay (HOSPITAL_COMMUNITY)
Admission: RE | Admit: 2019-10-08 | Payer: BC Managed Care – PPO | Source: Home / Self Care | Admitting: Orthopedic Surgery

## 2019-10-08 ENCOUNTER — Inpatient Hospital Stay (HOSPITAL_COMMUNITY): Payer: BC Managed Care – PPO | Admitting: Anesthesiology

## 2019-10-08 ENCOUNTER — Inpatient Hospital Stay (HOSPITAL_COMMUNITY): Payer: BC Managed Care – PPO

## 2019-10-08 ENCOUNTER — Encounter (HOSPITAL_COMMUNITY): Payer: Self-pay | Admitting: Anesthesiology

## 2019-10-08 ENCOUNTER — Encounter (HOSPITAL_COMMUNITY)
Admission: RE | Disposition: A | Discharge status: Rehabilitation Facility | Payer: Self-pay | Source: Home / Self Care | Attending: Orthopedic Surgery

## 2019-10-08 LAB — GLUCOSE, CAPILLARY
Glucose-Capillary: 175 mg/dL — ABNORMAL HIGH (ref 70–99)
Glucose-Capillary: 184 mg/dL — ABNORMAL HIGH (ref 70–99)
Glucose-Capillary: 217 mg/dL — ABNORMAL HIGH (ref 70–99)
Glucose-Capillary: 169 mg/dL — ABNORMAL HIGH (ref 70–99)

## 2019-10-08 LAB — HEMOGLOBIN A1C
Hgb A1c MFr Bld: 7.4 % — ABNORMAL HIGH (ref 4.8–5.6)
Mean Plasma Glucose: 165.68 mg/dL

## 2019-10-08 SURGERY — POSTERIOR LUMBAR FUSION 2 LEVEL
Anesthesia: General | Site: Spine Lumbar

## 2019-10-08 MED ORDER — KETAMINE HCL 50 MG/5ML IJ SOSY
PREFILLED_SYRINGE | INTRAMUSCULAR | Status: AC
  Filled 2019-10-08: qty 5

## 2019-10-08 MED ORDER — METFORMIN HCL ER 500 MG PO TB24: 500.0000 mg | ORAL_TABLET | ORAL | Status: DC

## 2019-10-08 MED ORDER — PHENOL 1.4 % MT LIQD: 1.0000 | OROMUCOSAL | Status: DC | PRN

## 2019-10-08 MED ORDER — POLYETHYLENE GLYCOL 3350 17 G PO PACK: 17.0000 g | PACK | Freq: Every day | ORAL | Status: DC | PRN

## 2019-10-08 MED ORDER — CEFAZOLIN SODIUM-DEXTROSE 1-4 GM/50ML-% IV SOLN
1.0000 g | Freq: Three times a day (TID) | INTRAVENOUS | Status: AC
  Administered 2019-10-08 (×2): 1 g via INTRAVENOUS
  Filled 2019-10-08 (×2): qty 50

## 2019-10-08 MED ORDER — LACTATED RINGERS IV SOLN
INTRAVENOUS | Status: DC | PRN
  Administered 2019-10-08: 08:00:00 via INTRAVENOUS

## 2019-10-08 MED ORDER — PROPOFOL 500 MG/50ML IV EMUL
INTRAVENOUS | Status: DC | PRN
  Administered 2019-10-08: 125 ug/kg/min via INTRAVENOUS

## 2019-10-08 MED ORDER — MIDAZOLAM HCL 5 MG/5ML IJ SOLN
INTRAMUSCULAR | Status: DC | PRN
  Administered 2019-10-08: 2 mg via INTRAVENOUS

## 2019-10-08 MED ORDER — CEFAZOLIN SODIUM-DEXTROSE 2-4 GM/100ML-% IV SOLN
2.0000 g | Freq: Once | INTRAVENOUS | Status: AC
  Administered 2019-10-08: 2 g via INTRAVENOUS
  Filled 2019-10-08: qty 100

## 2019-10-08 MED ORDER — HYDROMORPHONE HCL 1 MG/ML IJ SOLN
INTRAMUSCULAR | Status: AC
  Filled 2019-10-08: qty 1

## 2019-10-08 MED ORDER — ONDANSETRON HCL 4 MG/2ML IJ SOLN
INTRAMUSCULAR | Status: DC | PRN
  Administered 2019-10-08: 4 mg via INTRAVENOUS

## 2019-10-08 MED ORDER — ONDANSETRON HCL 4 MG PO TABS: 4.0000 mg | ORAL_TABLET | Freq: Three times a day (TID) | ORAL | 0 refills | Status: DC | PRN

## 2019-10-08 MED ORDER — MAGNESIUM CITRATE PO SOLN: 1.0000 | Freq: Once | ORAL | Status: DC | PRN

## 2019-10-08 MED ORDER — OXYCODONE-ACETAMINOPHEN 10-325 MG PO TABS: 1.0000 | ORAL_TABLET | Freq: Four times a day (QID) | ORAL | 0 refills | Status: DC | PRN

## 2019-10-08 MED ORDER — SODIUM CHLORIDE 0.9% FLUSH: 3.0000 mL | INTRAVENOUS | Status: DC | PRN

## 2019-10-08 MED ORDER — MORPHINE SULFATE (PF) 2 MG/ML IV SOLN: 2.0000 mg | INTRAVENOUS | Status: AC | PRN

## 2019-10-08 MED ORDER — ACETAMINOPHEN 650 MG RE SUPP: 650.0000 mg | RECTAL | Status: DC | PRN

## 2019-10-08 MED ORDER — METHOCARBAMOL 1000 MG/10ML IJ SOLN: 500.0000 mg | Freq: Four times a day (QID) | INTRAVENOUS | Status: DC | PRN

## 2019-10-08 MED ORDER — OXYCODONE HCL 5 MG PO TABS: 5.0000 mg | ORAL_TABLET | Freq: Once | ORAL | Status: DC | PRN

## 2019-10-08 MED ORDER — MENTHOL 3 MG MT LOZG: 1.0000 | LOZENGE | OROMUCOSAL | Status: DC | PRN

## 2019-10-08 MED ORDER — SODIUM CHLORIDE 0.9% FLUSH
3.0000 mL | Freq: Two times a day (BID) | INTRAVENOUS | Status: DC
  Administered 2019-10-08 – 2019-10-10 (×5): 3 mL via INTRAVENOUS

## 2019-10-08 MED ORDER — BUPIVACAINE-EPINEPHRINE 0.25% -1:200000 IJ SOLN
INTRAMUSCULAR | Status: DC | PRN
  Administered 2019-10-08: 20 mL

## 2019-10-08 MED ORDER — PHENYLEPHRINE 40 MCG/ML (10ML) SYRINGE FOR IV PUSH (FOR BLOOD PRESSURE SUPPORT)
PREFILLED_SYRINGE | INTRAVENOUS | Status: DC | PRN
  Administered 2019-10-08: 120 ug via INTRAVENOUS
  Administered 2019-10-08 (×2): 80 ug via INTRAVENOUS

## 2019-10-08 MED ORDER — PROPOFOL 10 MG/ML IV BOLUS
INTRAVENOUS | Status: DC | PRN
  Administered 2019-10-08: 20 mg via INTRAVENOUS
  Administered 2019-10-08: 30 mg via INTRAVENOUS
  Administered 2019-10-08: 200 mg via INTRAVENOUS

## 2019-10-08 MED ORDER — OXYCODONE HCL 5 MG/5ML PO SOLN: 5.0000 mg | Freq: Once | ORAL | Status: DC | PRN

## 2019-10-08 MED ORDER — HYDROMORPHONE HCL 1 MG/ML IJ SOLN
0.2500 mg | INTRAMUSCULAR | Status: DC | PRN
  Administered 2019-10-08: 0.5 mg via INTRAVENOUS

## 2019-10-08 MED ORDER — OXYCODONE HCL 5 MG PO TABS: 10.0000 mg | ORAL_TABLET | ORAL | Status: DC | PRN

## 2019-10-08 MED ORDER — INSULIN ASPART 100 UNIT/ML ~~LOC~~ SOLN: 0.0000 [IU] | Freq: Every day | SUBCUTANEOUS | Status: DC

## 2019-10-08 MED ORDER — SURGIFOAM 100 EX MISC
CUTANEOUS | Status: DC | PRN
  Administered 2019-10-08: 1

## 2019-10-08 MED ORDER — SUCCINYLCHOLINE CHLORIDE 200 MG/10ML IV SOSY
PREFILLED_SYRINGE | INTRAVENOUS | Status: DC | PRN
  Administered 2019-10-08: 120 mg via INTRAVENOUS

## 2019-10-08 MED ORDER — KETAMINE HCL 10 MG/ML IJ SOLN
INTRAMUSCULAR | Status: DC | PRN
  Administered 2019-10-08: 10 mg via INTRAVENOUS
  Administered 2019-10-08: 30 mg via INTRAVENOUS
  Administered 2019-10-08: 10 mg via INTRAVENOUS

## 2019-10-08 MED ORDER — ACETAMINOPHEN 325 MG PO TABS: 650.0000 mg | ORAL_TABLET | ORAL | Status: DC | PRN

## 2019-10-08 MED ORDER — DEXAMETHASONE SODIUM PHOSPHATE 4 MG/ML IJ SOLN
INTRAMUSCULAR | Status: DC | PRN
  Administered 2019-10-08: 4 mg via INTRAVENOUS

## 2019-10-08 MED ORDER — LACTATED RINGERS IV SOLN: INTRAVENOUS | Status: DC

## 2019-10-08 MED ORDER — LIDOCAINE 2% (20 MG/ML) 5 ML SYRINGE
INTRAMUSCULAR | Status: DC | PRN
  Administered 2019-10-08: 100 mg via INTRAVENOUS

## 2019-10-08 MED ORDER — BUPIVACAINE-EPINEPHRINE 0.5% -1:200000 IJ SOLN
INTRAMUSCULAR | Status: AC
  Filled 2019-10-08: qty 1

## 2019-10-08 MED ORDER — SODIUM CHLORIDE 0.9 % IV SOLN
INTRAVENOUS | Status: DC | PRN
  Administered 2019-10-08: 50 ug/min via INTRAVENOUS

## 2019-10-08 MED ORDER — HEMOSTATIC AGENTS (NO CHARGE) OPTIME
TOPICAL | Status: DC | PRN
  Administered 2019-10-08: 1

## 2019-10-08 MED ORDER — SODIUM CHLORIDE 0.9 % IV SOLN
250.0000 mL | INTRAVENOUS | Status: DC
  Administered 2019-10-08: 250 mL via INTRAVENOUS

## 2019-10-08 MED ORDER — 0.9 % SODIUM CHLORIDE (POUR BTL) OPTIME
TOPICAL | Status: DC | PRN
  Administered 2019-10-08: 1000 mL

## 2019-10-08 MED ORDER — ONDANSETRON HCL 4 MG/2ML IJ SOLN: 4.0000 mg | Freq: Once | INTRAMUSCULAR | Status: DC | PRN

## 2019-10-08 MED ORDER — ONDANSETRON HCL 4 MG PO TABS: 4.0000 mg | ORAL_TABLET | Freq: Four times a day (QID) | ORAL | Status: DC | PRN

## 2019-10-08 MED ORDER — METHOCARBAMOL 500 MG PO TABS: 500.0000 mg | ORAL_TABLET | Freq: Three times a day (TID) | ORAL | 0 refills | Status: DC | PRN

## 2019-10-08 MED ORDER — FENTANYL CITRATE (PF) 100 MCG/2ML IJ SOLN
INTRAMUSCULAR | Status: DC | PRN
  Administered 2019-10-08 (×2): 100 ug via INTRAVENOUS

## 2019-10-08 MED ORDER — GABAPENTIN 300 MG PO CAPS: 300.0000 mg | ORAL_CAPSULE | Freq: Three times a day (TID) | ORAL | Status: DC

## 2019-10-08 MED ORDER — THROMBIN 20000 UNITS EX SOLR
CUTANEOUS | Status: DC | PRN
  Administered 2019-10-08: 20 mL

## 2019-10-08 MED ORDER — ACETAMINOPHEN 10 MG/ML IV SOLN
1000.0000 mg | Freq: Once | INTRAVENOUS | Status: AC
  Administered 2019-10-08: 1000 mg via INTRAVENOUS

## 2019-10-08 MED ORDER — METHOCARBAMOL 500 MG PO TABS: 500.0000 mg | ORAL_TABLET | Freq: Four times a day (QID) | ORAL | Status: DC | PRN

## 2019-10-08 MED ORDER — INSULIN ASPART 100 UNIT/ML ~~LOC~~ SOLN: 0.0000 [IU] | Freq: Three times a day (TID) | SUBCUTANEOUS | Status: DC

## 2019-10-08 MED ORDER — OXYCODONE HCL 5 MG PO TABS: 5.0000 mg | ORAL_TABLET | ORAL | Status: DC | PRN

## 2019-10-08 MED ORDER — ONDANSETRON HCL 4 MG/2ML IJ SOLN: 4.0000 mg | Freq: Four times a day (QID) | INTRAMUSCULAR | Status: DC | PRN

## 2019-10-08 MED FILL — Heparin Sodium (Porcine) Inj 1000 Unit/ML: INTRAMUSCULAR | Qty: 30 | Status: AC

## 2019-10-08 MED FILL — Sodium Chloride IV Soln 0.9%: INTRAVENOUS | Qty: 1000 | Status: AC

## 2019-10-08 SURGICAL SUPPLY — 76 items
BLADE CLIPPER SURG (BLADE) IMPLANT
BUR EGG ELITE 4.0 (BURR) IMPLANT
BUR EGG ELITE 4.0MM (BURR)
CABLE BIPOLOR RESECTION CORD (MISCELLANEOUS) ×3 IMPLANT
CLIP NEUROVISION LG (CLIP) ×4 IMPLANT
CLOSURE STERI-STRIP 1/2X4 (GAUZE/BANDAGES/DRESSINGS) ×1
CLOSURE WOUND 1/2 X4 (GAUZE/BANDAGES/DRESSINGS) ×1
CLSR STERI-STRIP ANTIMIC 1/2X4 (GAUZE/BANDAGES/DRESSINGS) ×2 IMPLANT
COVER MAYO STAND STRL (DRAPES) IMPLANT
COVER SURGICAL LIGHT HANDLE (MISCELLANEOUS) ×3 IMPLANT
COVER WAND RF STERILE (DRAPES) ×3 IMPLANT
DRAPE C-ARM 42X72 X-RAY (DRAPES) ×3 IMPLANT
DRAPE C-ARMOR (DRAPES) ×3 IMPLANT
DRAPE POUCH INSTRU U-SHP 10X18 (DRAPES) ×3 IMPLANT
DRAPE SURG 17X23 STRL (DRAPES) ×3 IMPLANT
DRAPE U-SHAPE 47X51 STRL (DRAPES) ×3 IMPLANT
DRSG OPSITE POSTOP 3X4 (GAUZE/BANDAGES/DRESSINGS) ×2 IMPLANT
DRSG OPSITE POSTOP 4X6 (GAUZE/BANDAGES/DRESSINGS) ×2 IMPLANT
DRSG OPSITE POSTOP 4X8 (GAUZE/BANDAGES/DRESSINGS) ×3 IMPLANT
DURAPREP 26ML APPLICATOR (WOUND CARE) ×3 IMPLANT
ELECT BLADE 4.0 EZ CLEAN MEGAD (MISCELLANEOUS)
ELECT BLADE 6.5 EXT (BLADE) ×3 IMPLANT
ELECT CAUTERY BLADE 6.4 (BLADE) ×3 IMPLANT
ELECT PENCIL ROCKER SW 15FT (MISCELLANEOUS) ×3 IMPLANT
ELECT REM PT RETURN 9FT ADLT (ELECTROSURGICAL) ×3
ELECTRODE BLDE 4.0 EZ CLN MEGD (MISCELLANEOUS) IMPLANT
ELECTRODE REM PT RTRN 9FT ADLT (ELECTROSURGICAL) ×1 IMPLANT
GLOVE BIOGEL PI IND STRL 8.5 (GLOVE) ×1 IMPLANT
GLOVE BIOGEL PI INDICATOR 8.5 (GLOVE) ×2
GLOVE SS BIOGEL STRL SZ 8.5 (GLOVE) ×1 IMPLANT
GLOVE SUPERSENSE BIOGEL SZ 8.5 (GLOVE) ×2
GOWN STRL REUS W/ TWL LRG LVL3 (GOWN DISPOSABLE) ×1 IMPLANT
GOWN STRL REUS W/TWL 2XL LVL3 (GOWN DISPOSABLE) ×6 IMPLANT
GOWN STRL REUS W/TWL LRG LVL3 (GOWN DISPOSABLE) ×2
GUIDEWIRE NITINOL BEVEL TIP (WIRE) ×12 IMPLANT
KIT BASIN OR (CUSTOM PROCEDURE TRAY) ×3 IMPLANT
KIT POSITION SURG JACKSON T1 (MISCELLANEOUS) ×3 IMPLANT
KIT TURNOVER KIT B (KITS) ×3 IMPLANT
MODULE EMG NDL SSEP NVM5 (NEEDLE) IMPLANT
MODULE EMG NEEDLE SSEP NVM5 (NEEDLE) ×3 IMPLANT
MODULE NVM5 NEXT GEN EMG (NEEDLE) ×2 IMPLANT
NDL I-PASS III (NEEDLE) IMPLANT
NDL SPNL 18GX3.5 QUINCKE PK (NEEDLE) ×1 IMPLANT
NEEDLE 22X1 1/2 (OR ONLY) (NEEDLE) ×3 IMPLANT
NEEDLE I-PASS III (NEEDLE) ×3 IMPLANT
NEEDLE SPNL 18GX3.5 QUINCKE PK (NEEDLE) ×3 IMPLANT
NS IRRIG 1000ML POUR BTL (IV SOLUTION) ×3 IMPLANT
PACK LAMINECTOMY ORTHO (CUSTOM PROCEDURE TRAY) ×3 IMPLANT
PACK UNIVERSAL I (CUSTOM PROCEDURE TRAY) ×3 IMPLANT
PAD ARMBOARD 7.5X6 YLW CONV (MISCELLANEOUS) ×6 IMPLANT
PATTIES SURGICAL .5 X.5 (GAUZE/BANDAGES/DRESSINGS) IMPLANT
PATTIES SURGICAL .5 X1 (DISPOSABLE) ×3 IMPLANT
POSITIONER HEAD PRONE TRACH (MISCELLANEOUS) ×3 IMPLANT
PROBE BALL TIP NVM5 SNG USE (BALLOONS) ×4 IMPLANT
ROD RELINE MAS LORD 5.5X75MM (Rod) ×2 IMPLANT
ROD RELINE TI MAS 5.5X70MM LD (Rod) ×2 IMPLANT
SCREW LOCK RELINE 5.5 TULIP (Screw) ×14 IMPLANT
SCREW RELINE MAS POLY 6.5X40MM (Screw) ×14 IMPLANT
SPONGE LAP 4X18 RFD (DISPOSABLE) ×6 IMPLANT
SPONGE SURGIFOAM ABS GEL 100 (HEMOSTASIS) ×3 IMPLANT
STRIP CLOSURE SKIN 1/2X4 (GAUZE/BANDAGES/DRESSINGS) ×1 IMPLANT
SURGIFLO W/THROMBIN 8M KIT (HEMOSTASIS) ×2 IMPLANT
SUT BONE WAX W31G (SUTURE) ×3 IMPLANT
SUT MNCRL AB 3-0 PS2 18 (SUTURE) ×6 IMPLANT
SUT VIC AB 1 CT1 18XCR BRD 8 (SUTURE) ×1 IMPLANT
SUT VIC AB 1 CT1 8-18 (SUTURE) ×2
SUT VIC AB 1 CTX 36 (SUTURE)
SUT VIC AB 1 CTX36XBRD ANBCTR (SUTURE) IMPLANT
SUT VIC AB 2-0 CT1 18 (SUTURE) ×5 IMPLANT
SYR BULB IRRIGATION 50ML (SYRINGE) ×3 IMPLANT
SYR CONTROL 10ML LL (SYRINGE) ×3 IMPLANT
TOWEL GREEN STERILE (TOWEL DISPOSABLE) ×3 IMPLANT
TOWEL GREEN STERILE FF (TOWEL DISPOSABLE) ×3 IMPLANT
TRAY FOLEY MTR SLVR 16FR STAT (SET/KITS/TRAYS/PACK) ×3 IMPLANT
WATER STERILE IRR 1000ML POUR (IV SOLUTION) ×3 IMPLANT
YANKAUER SUCT BULB TIP NO VENT (SUCTIONS) ×3 IMPLANT

## 2019-10-08 NOTE — Anesthesia Preprocedure Evaluation (Signed)
Anesthesia Evaluation  Patient identified by MRN, date of birth, ID band Patient awake    Reviewed: Allergy & Precautions, NPO status , Patient's Chart, lab work & pertinent test results  History of Anesthesia Complications Negative for: history of anesthetic complications  Airway Mallampati: II  TM Distance: >3 FB Neck ROM: Full    Dental   Pulmonary Patient abstained from smoking., former smoker,    Pulmonary exam normal        Cardiovascular hypertension, Pt. on medications Normal cardiovascular exam     Neuro/Psych negative neurological ROS  negative psych ROS   GI/Hepatic negative GI ROS, Neg liver ROS,   Endo/Other  diabetes, Type 2, Oral Hypoglycemic Agents  Renal/GU negative Renal ROS  negative genitourinary   Musculoskeletal negative musculoskeletal ROS (+)   Abdominal   Peds  Hematology negative hematology ROS (+)   Anesthesia Other Findings   Reproductive/Obstetrics                             Anesthesia Physical  Anesthesia Plan  ASA: II  Anesthesia Plan: General   Post-op Pain Management:    Induction: Intravenous  PONV Risk Score and Plan: 2 and Ondansetron, Dexamethasone, Treatment may vary due to age or medical condition and Midazolam  Airway Management Planned: Oral ETT  Additional Equipment: None  Intra-op Plan:   Post-operative Plan: Extubation in OR  Informed Consent: I have reviewed the patients History and Physical, chart, labs and discussed the procedure including the risks, benefits and alternatives for the proposed anesthesia with the patient or authorized representative who has indicated his/her understanding and acceptance.     Dental advisory given  Plan Discussed with:   Anesthesia Plan Comments:         Anesthesia Quick Evaluation

## 2019-10-08 NOTE — Anesthesia Preprocedure Evaluation (Deleted)
Anesthesia Evaluation  Patient identified by MRN, date of birth, ID band Patient awake    Reviewed: Allergy & Precautions, NPO status , Patient's Chart, lab work & pertinent test results  Airway Mallampati: II  TM Distance: >3 FB Neck ROM: Full    Dental  (+) Teeth Intact, Dental Advisory Given   Pulmonary Patient abstained from smoking., former smoker,    breath sounds clear to auscultation       Cardiovascular hypertension, Normal cardiovascular exam     Neuro/Psych  Neuromuscular disease negative psych ROS   GI/Hepatic negative GI ROS,   Endo/Other  diabetes, Type 2, Oral Hypoglycemic AgentsMorbid obesity  Renal/GU   negative genitourinary   Musculoskeletal   Abdominal   Peds  Hematology   Anesthesia Other Findings   Reproductive/Obstetrics                             Anesthesia Physical Anesthesia Plan  ASA: II  Anesthesia Plan: General   Post-op Pain Management:    Induction: Intravenous  PONV Risk Score and Plan: Ondansetron, Dexamethasone and Midazolam  Airway Management Planned: Oral ETT  Additional Equipment:   Intra-op Plan:   Post-operative Plan: Extubation in OR  Informed Consent: I have reviewed the patients History and Physical, chart, labs and discussed the procedure including the risks, benefits and alternatives for the proposed anesthesia with the patient or authorized representative who has indicated his/her understanding and acceptance.     Dental advisory given  Plan Discussed with: CRNA, Anesthesiologist and Surgeon  Anesthesia Plan Comments:         Anesthesia Quick Evaluation

## 2019-10-08 NOTE — Progress Notes (Signed)
    Subjective: Procedure(s) (LRB): Posterior spinal fusion interbody, possible decompression L3-5 (N/A) Day of Surgery  Patient reports pain as 4 on 0-10 scale.  Reports decreased leg pain reports incisional back pain   Foley in place Negative bowel movement Negative flatus Negative chest pain or shortness of breath  Objective: Vital signs in last 24 hours: Temp:  [97 F (36.1 C)-98.7 F (37.1 C)] 98.7 F (37.1 C) (10/23 0500) Pulse Rate:  [77-89] 80 (10/23 0500) Resp:  [16-24] 18 (10/23 0500) BP: (126-152)/(54-85) 126/54 (10/23 0500) SpO2:  [93 %-100 %] 93 % (10/23 0500)  Intake/Output from previous day: 10/22 0701 - 10/23 0700 In: 3317.2 [I.V.:3067.2; IV Piggyback:100] Out: 3700 [Urine:3450; Blood:250]  Labs: No results for input(s): WBC, RBC, HCT, PLT in the last 72 hours. No results for input(s): NA, K, CL, CO2, BUN, CREATININE, GLUCOSE, CALCIUM in the last 72 hours. No results for input(s): LABPT, INR in the last 72 hours.  Physical Exam: Neurologically intact ABD soft Intact pulses distally Dorsiflexion/Plantar flexion intact Incision: dressing C/D/I and no drainage Compartment soft Body mass index is 36.92 kg/m.   Assessment/Plan: Patient stable  xrays n/a Continue mobilization with physical therapy Continue care  Patient reports left groin and hip flexor pain which is to be expected given the surgical approach from yesterday.  The preoperative neuropathic leg pain has improved.  At this point time given the improvement in the neuropathic pain I do not think formal posterior decompression is going to be required.  We will move forward with the L3-L5 pedicle screw placement and fusion to supplement the anterior interbody fixation.  I have gone over this with the patient and all of his questions were addressed.  Risks and benefits were also reviewed again.   Melina Schools, MD [facility] [phone]

## 2019-10-08 NOTE — Brief Op Note (Signed)
10/07/2019 - 10/08/2019  10:04 AM  PATIENT:  Aaron Burns  63 y.o. 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

## 2019-10-08 NOTE — Plan of Care (Addendum)
  Problem: Clinical Measurements: Goal: Ability to maintain clinical measurements within normal limits will improve Note: Pt arrived on floor, transferred from PACU.  Patient is A&OX4 and in no acute distress. Pt states pain is 4/10, with no complaints of nausea. Pt placed on 2L of oxygen via [place] for O2 sats in mid 80s (now at 92% with oxygen). Pt instructed on using incentive spirometer and to cough and deep breathe, verbalized understanding. Honeycomb dsg to lower back in addition to the one already placed yesterday to left abdominal/flank area. Will continue to monitor.

## 2019-10-08 NOTE — Transfer of Care (Signed)
Immediate Anesthesia Transfer of Care Note  Patient: Oved Palmberg  Procedure(s) Performed: Posterior spinal fusion interbody, possible decompression Lumbar three to lumbar five (N/A Spine Lumbar)  Patient Location: PACU  Anesthesia Type:General  Level of Consciousness: oriented, sedated and patient cooperative  Airway & Oxygen Therapy: Patient Spontanous Breathing and Patient connected to face mask oxygen  Post-op Assessment: Report given to RN and Post -op Vital signs reviewed and stable  Post vital signs: Reviewed  Last Vitals:  Vitals Value Taken Time  BP 138/68 10/08/19 1045  Temp 36.3 C 10/08/19 1045  Pulse 100 10/08/19 1045  Resp 25 10/08/19 1049  SpO2 87 % 10/08/19 1045  Vitals shown include unvalidated device data.  Last Pain:  Vitals:   10/08/19 1045  TempSrc:   PainSc: (P) Asleep      Patients Stated Pain Goal: 3 ([id number])  Complications: No apparent anesthesia complications

## 2019-10-08 NOTE — Discharge Instructions (Signed)

## 2019-10-08 NOTE — Anesthesia Postprocedure Evaluation (Signed)
Anesthesia Post Note  Patient: Finnly Brietzke  Procedure(s) Performed: Oblique lateral interbody fusion LUMBAR FOUR-FIVE, anterior lateral interbody fusion LUMBAR THREE-FOUR (N/A ) ABDOMINAL EXPOSURE (N/A )     Patient location during evaluation: PACU Anesthesia Type: General Level of consciousness: awake and alert Pain management: pain level controlled Vital Signs Assessment: post-procedure vital signs reviewed and stable Respiratory status: spontaneous breathing, nonlabored ventilation and respiratory function stable Cardiovascular status: blood pressure returned to baseline and stable Postop Assessment: no apparent nausea or vomiting Anesthetic complications: no    Last Vitals:  Vitals:   10/07/19 2300 10/08/19 0500  BP: 126/63 (!) 126/54  Pulse: 78 80  Resp: 18 18  Temp: 36.7 C 37.1 C  SpO2:  93%    Last Pain:  Vitals:   10/08/19 0500  TempSrc: Oral  PainSc:                  Lidia Collum

## 2019-10-08 NOTE — Evaluation (Signed)
Physical Therapy Evaluation Patient Details Name: Aaron Burns MRN: [id number] DOB: [birth year] Today's Date: 10/08/2019   History of Present Illness  Pt is a 63 y/o male s/p L3-4 ALIF and s/p L3-5 PLIF. PMH includes HTN and DM.   Clinical Impression  Patient is s/p above surgery resulting in the deficits listed below (see PT Problem List). Pt requiring min-mod A for mobility tasks using RW this session. Gait limited to chair this session as pt with increased pain and light headedness. Symptoms resolved with seated rest. Educated about back precautions.  Patient will benefit from skilled PT to increase their independence and safety with mobility (while adhering to their precautions) to allow discharge to the venue listed below.     Follow Up Recommendations Home health PT;Supervision for mobility/OOB(pending mobility progression)    Equipment Recommendations  3in1 (PT)    Recommendations for Other Services       Precautions / Restrictions Precautions Precautions: Back Precaution Booklet Issued: No Precaution Comments: Verbally reviewed back precautions with pt.  Required Braces or Orthoses: Spinal Brace Spinal Brace: Lumbar corset;Applied in sitting position Restrictions Weight Bearing Restrictions: No      Mobility  Bed Mobility Overal bed mobility: Needs Assistance Bed Mobility: Rolling;Sidelying to Sit Rolling: Mod assist Sidelying to sit: Mod assist       General bed mobility comments: Mod A for assist with rolling, LE assist and trunk elevation to come to sitting. Cues for sequencing using log roll technique.   Transfers Overall transfer level: Needs assistance Equipment used: Rolling walker (2 wheeled) Transfers: Sit to/from Stand Sit to Stand: Mod assist;From elevated surface         General transfer comment: Mod A for lift assist and steadying from elevated surface. Cues for safe hand placement. Pt with incontinent episode upon standing.    Ambulation/Gait Ambulation/Gait assistance: Min guard Gait Distance (Feet): 5 Feet Assistive device: Rolling walker (2 wheeled) Gait Pattern/deviations: Step-through pattern;Decreased stride length Gait velocity: Decreased   General Gait Details: Very slow, guarded gait. Pt with increased pain and reporting lightheadedness, so distance limited to chair.   Stairs            Wheelchair Mobility    Modified Rankin (Stroke Patients Only)       Balance Overall balance assessment: Needs assistance Sitting-balance support: No upper extremity supported;Feet supported Sitting balance-Leahy Scale: Fair     Standing balance support: Bilateral upper extremity supported;During functional activity Standing balance-Leahy Scale: Poor Standing balance comment: Reliant on BUE support                              Pertinent Vitals/Pain Pain Assessment: Faces Faces Pain Scale: Hurts whole lot Pain Location: back Pain Descriptors / Indicators: Aching;Operative site guarding Pain Intervention(s): Limited activity within patient's tolerance;Monitored during session;Repositioned    Home Living Family/patient expects to be discharged to:: Private residence Living Arrangements: Spouse/significant other Available Help at Discharge: Family Type of Home: House Home Access: Stairs to enter Entrance Stairs-Rails: Right Entrance Stairs-Number of Steps: 3 Home Layout: One level Home Equipment: Environmental consultant - 2 wheels      Prior Function Level of Independence: Independent               Hand Dominance        Extremity/Trunk Assessment   Upper Extremity Assessment Upper Extremity Assessment: Defer to OT evaluation    Lower Extremity Assessment Lower Extremity Assessment: Generalized weakness  Cervical / Trunk Assessment Cervical / Trunk Assessment: Other exceptions Cervical / Trunk Exceptions: s/p lumbar surgery   Communication   Communication: No difficulties   Cognition Arousal/Alertness: Awake/alert Behavior During Therapy: WFL for tasks assessed/performed Overall Cognitive Status: Within Functional Limits for tasks assessed                                        General Comments General comments (skin integrity, edema, etc.): Pt's wife present during session.     Exercises     Assessment/Plan    PT Assessment Patient needs continued PT services  PT Problem List Decreased strength;Decreased activity tolerance;Decreased balance;Decreased mobility;Decreased knowledge of use of DME;Decreased knowledge of precautions;Pain       PT Treatment Interventions Gait training;DME instruction;Stair training;Functional mobility training;Therapeutic activities;Therapeutic exercise;Balance training;Patient/family education    PT Goals (Current goals can be found in the Care Plan section)  Acute Rehab PT Goals Patient Stated Goal: to be able to walk better PT Goal Formulation: With patient Time For Goal Achievement: 10/22/19 Potential to Achieve Goals: Good    Frequency Min 5X/week   Barriers to discharge        Co-evaluation               AM-PAC PT "6 Clicks" Mobility  Outcome Measure Help needed turning from your back to your side while in a flat bed without using bedrails?: A Lot Help needed moving from lying on your back to sitting on the side of a flat bed without using bedrails?: A Lot Help needed moving to and from a bed to a chair (including a wheelchair)?: A Little Help needed standing up from a chair using your arms (e.g., wheelchair or bedside chair)?: A Lot Help needed to walk in hospital room?: A Lot Help needed climbing 3-5 steps with a railing? : A Lot 6 Click Score: 13    End of Session Equipment Utilized During Treatment: Gait belt;Back brace Activity Tolerance: Patient limited by pain;Treatment limited secondary to medical complications (Comment) Patient left: in chair;with call bell/phone within  reach;with family/visitor present Nurse Communication: Mobility status PT Visit Diagnosis: Unsteadiness on feet (R26.81);Muscle weakness (generalized) (M62.81);Pain Pain - part of body: (back)    Time: [id number] PT Time Calculation (min) (ACUTE ONLY): 40 min   Charges:   PT Evaluation $PT Eval Moderate Complexity: 1 Mod PT Treatments $Therapeutic Activity: 23-37 mins        Leighton Ruff, PT, DPT  Acute Rehabilitation Services  Pager: [phone] Office: [phone]   Rudean Hitt 10/08/2019, 4:07 PM

## 2019-10-08 NOTE — Op Note (Signed)
Operative report  Preoperative diagnosis: Degenerative disc disease with slight scoliosis and radicular leg pain L3-4, L4-5.  Status post interbody fusion L3-4 L4-5.  Postoperative diagnosis: Same  Operative procedure: Posterior instrumented fusion L3-5.  First assistant: Estill Bamberg Ward  Implants: NuVasive MIS pedicle screws.  6.5 x 40 mm length.  Neuro monitoring: All 6 pedicle screws were directly stimulated and there was no adverse activity at greater than 40 mA.  There was no abnormal activity throughout the course of the case.  Indications: Aaron Burns is a very pleasant 63 year old gentleman who underwent a lateral interbody fusion L3-4 and an anterior oblique interbody fusion L4-5 yesterday.  The patient tolerated the intervertebral fusions well.  This morning he noted that his preoperative neuropathic leg pain had improved.  As result of the improved neuropathic pain I felt as though we can move forward with just the instrumentation.  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  The patient expressed an understanding of this and agreed with the surgical plan.  Operative report: Patient was brought the operating room placed supine on the operating room table.  After successful induction of general anesthesia and endotracheal intubation teds SCDs were reapplied and he was turned prone onto the [facility] spine frame.  All bony prominences were well-padded and the back was prepped and draped in a standard fashion.  The neuro monitoring representative placed all appropriate needles for monitoring.  Timeout was taken confirming patient procedure and all other important data.  Using fluoroscopy identified the lateral border of the L3-5 pedicles.  I marked out on the right-hand side and infiltrated this area with quarter percent Marcaine with epinephrine.  Small incision was made on the lateral side of the  L3 pedicle and I advanced the Jamshidi needle down to the lateral aspect of the L3 pedicle.  I confirmed satisfactory position in the AP plane.  I then advanced the Jamshidi needle while directly stimulating and imaging into the L3 pedicle.  As I advanced towards the medial wall of the pedicle I switched my view to the lateral plane.  In the lateral plane I confirmed that I was just beyond the posterior wall the vertebral body.  At this point I was pleased with my trajectory and so I advanced the Jamshidi needle into the vertebral body.  I then placed the guidepin to cannulate the pedicle.  Using this exact same technique I cannulated the L4 and L5 pedicle and then went to the contralateral side and cannulated all 3 pedicles.  Imaging studies demonstrate satisfactory position of all 6 guidepins.  I then measured and elected to use the 6.5 x 40 mm length pedicle screws at all levels.  The screw was placed over the guidepin and advanced into the pedicle.  Again using fluoroscopy as well as direct stimulation to confirm satisfactory position and no evidence of abnormal nerve activity.  Once all 6 pedicle screws were properly positioned and the guidepins were removed I reevaluated with fluoroscopy to confirm satisfactory position of all 6 pedicle screws.  I then directly stimulated each pedicle screw and there was no adverse activity at greater than 40 mA.  I then measured and on the left side placed a 70 mm length rod and on the right side I placed a 75 mm length rod.  Both rods were seated percutaneously and the locking caps were advanced down and.  I then torqued off all the locking knots according  Web designer.  I then remove the inserting tabs I took my final x-rays.  Both in the AP and lateral planes all 6 pedicle screws and the intervertebral cages were properly positioned.  There is no abnormal signals on SSEP or free running EMG.  At this point all wounds were irrigated copiously with normal saline  and using bipolar cautery and FloSeal I obtained and maintain hemostasis.  All wounds were closed in a layered fashion with interrupted #1 Vicryl suture, 2-0 Vicryl suture, and a 3-0 Monocryl.  Steri-Strips and a dry dressing were applied and the patient was ultimately extubated transfer the PACU without incident.  The end of the case all needle sponge counts were correct there were no adverse intraoperative events.

## 2019-10-08 NOTE — Anesthesia Procedure Notes (Addendum)
Procedure Name: Intubation Date/Time: 10/08/2019 7:51 AM Performed by: Jenne Campus, CRNA Pre-anesthesia Checklist: Patient identified, Emergency Drugs available, Suction available and Patient being monitored Patient Re-evaluated:Patient Re-evaluated prior to induction Oxygen Delivery Method: Circle System Utilized Preoxygenation: Pre-oxygenation with 100% oxygen Induction Type: IV induction Ventilation: Mask ventilation without difficulty and Oral airway inserted - appropriate to patient size Laryngoscope Size: Mac and 4 Grade View: Grade I Tube type: Oral Tube size: 7.5 mm Number of attempts: 1 Airway Equipment and Method: Stylet and Oral airway Placement Confirmation: ETT inserted through vocal cords under direct vision,  positive ETCO2 and breath sounds checked- equal and bilateral Secured at: 23 cm Tube secured with: Tape Dental Injury: Teeth and Oropharynx as per pre-operative assessment

## 2019-10-08 NOTE — Anesthesia Postprocedure Evaluation (Signed)
Anesthesia Post Note  Patient: Aaron Burns  Procedure(s) Performed: Posterior Lumbar fusion - Lumbar three to lumbar five (N/A Spine Lumbar)     Patient location during evaluation: PACU Anesthesia Type: General Level of consciousness: awake and alert Pain management: pain level controlled Vital Signs Assessment: post-procedure vital signs reviewed and stable Respiratory status: spontaneous breathing, nonlabored ventilation, respiratory function stable and patient connected to nasal cannula oxygen Cardiovascular status: blood pressure returned to baseline and stable Postop Assessment: no apparent nausea or vomiting Anesthetic complications: no    Last Vitals:  Vitals:   10/08/19 1145 10/08/19 1156  BP: 127/63 123/65  Pulse: 74 76  Resp: 19 19  Temp: (!) 36.3 C   SpO2: 95% 91%    Last Pain:  Vitals:   10/08/19 1145  TempSrc:   PainSc: 0-No pain                 Lidia Collum

## 2019-10-09 LAB — GLUCOSE, CAPILLARY
Glucose-Capillary: 163 mg/dL — ABNORMAL HIGH (ref 70–99)
Glucose-Capillary: 215 mg/dL — ABNORMAL HIGH (ref 70–99)
Glucose-Capillary: 200 mg/dL — ABNORMAL HIGH (ref 70–99)
Glucose-Capillary: 183 mg/dL — ABNORMAL HIGH (ref 70–99)

## 2019-10-09 NOTE — Progress Notes (Signed)
Rehab Admissions Coordinator Note:  Per PT recommendation, this patient was screened by Raechel Ache for appropriateness for an Inpatient Acute Rehab Consult.  At this time, we are recommending Inpatient Rehab consult. Please have attending MD place consult order if the patient would like to be considered for CIR.   Raechel Ache 10/09/2019, 11:07 AM  I can be reached at [phone].

## 2019-10-09 NOTE — Progress Notes (Addendum)
Occupational Therapy Evaluation Patient Details Name: Aaron Burns MRN: [id number] DOB: 12-14-1956 Today's Date: 10/09/2019    History of Present Illness Pt is a 63 y/o male s/p L3-4 ALIF and s/p L3-5 PLIF. PMH includes HTN and DM.    Clinical Impression   PTA, pt lived in one story home with his wife, and worked for a Copywriter, [employer]. Pt currently presents with decreased strength, balance, activity tolerance, safety, knowledge of precautions, and increased pain. Initiated education regarding brace management and compensatory strategies for completing ADLs. Pt required min A for brace management, max A for LB ADLs, and min A for functional mobility. Pt would benefit from continued OT acutely to facilitate safe dc. Due to prior level of function and motivation to return to independence, recommend dc to CIR to increase safe performance of ADLs.     Follow Up Recommendations  CIR    Equipment Recommendations  Other (comment)(defer to next venue)    Recommendations for Other Services PT consult     Precautions / Restrictions Precautions Precautions: Back Precaution Booklet Issued: No Precaution Comments: Verbally reviewed back precautions with pt.  Required Braces or Orthoses: Spinal Brace Spinal Brace: Lumbar corset;Applied in sitting position Restrictions Weight Bearing Restrictions: No      Mobility Bed Mobility               General bed mobility comments: pt sitting in recliner upon arrival  Transfers Overall transfer level: Needs assistance Equipment used: Rolling walker (2 wheeled) Transfers: Sit to/from Stand Sit to Stand: Min assist         General transfer comment: min A to power up, cues for hand placement    Balance Overall balance assessment: Needs assistance Sitting-balance support: No upper extremity supported;Feet supported Sitting balance-Leahy Scale: Fair     Standing balance support: Bilateral upper extremity supported;During functional  activity;No upper extremity supported Standing balance-Leahy Scale: Fair Standing balance comment: able to maintain static standing for short periods of time without BUE support                           ADL either performed or assessed with clinical judgement   ADL Overall ADL's : Needs assistance/impaired Eating/Feeding: Independent;Sitting   Grooming: Oral care;Min guard;Standing Grooming Details (indicate cue type and reason): pt required min guard A for safety. provided education for compensatory strategies, required VCs to use strategies Upper Body Bathing: Min guard;Sitting   Lower Body Bathing: Minimal assistance;Sit to/from stand;Maximal assistance Lower Body Bathing Details (indicate cue type and reason): provided education for compensatory techniques for LB ADLs. Required min A and VCs for using strategies for peri-care. Pt requires mod A for rest of LB bathing Upper Body Dressing : Min guard;Cueing for sequencing;Sitting Upper Body Dressing Details (indicate cue type and reason): Provided education regarding brace management. Required min guard A and VCs for donning brace correctly Lower Body Dressing: Maximal assistance;Sit to/from stand Lower Body Dressing Details (indicate cue type and reason): Provided education regarding compensatory techniques for LB ADLs. Pt required max A to don LB clothing Toilet Transfer: Minimal assistance;Ambulation;RW(simulated to recliner)   Toileting- Clothing Manipulation and Hygiene: Minimal assistance;Sit to/from stand       Functional mobility during ADLs: Min guard;Rolling walker General ADL Comments: Pt required min guard A for UB ADLs, mod A-max A for LB ADLs, and min guard A for functional mobility.      Vision Baseline Vision/History: Wears glasses Wears Glasses: Reading only Patient  Visual Report: No change from baseline       Perception     Praxis      Pertinent Vitals/Pain Pain Assessment: Faces Faces Pain  Scale: Hurts even more Pain Location: back Pain Descriptors / Indicators: Aching;Constant;Discomfort;Grimacing;Operative site guarding;Sore Pain Intervention(s): Monitored during session;Repositioned     Hand Dominance Right   Extremity/Trunk Assessment Upper Extremity Assessment Upper Extremity Assessment: Generalized weakness   Lower Extremity Assessment Lower Extremity Assessment: Defer to PT evaluation   Cervical / Trunk Assessment Cervical / Trunk Assessment: Other exceptions Cervical / Trunk Exceptions: s/p lumbar surgery    Communication Communication Communication: No difficulties   Cognition Arousal/Alertness: Awake/alert Behavior During Therapy: WFL for tasks assessed/performed Overall Cognitive Status: Impaired/Different from baseline Area of Impairment: Awareness;Problem solving;Following commands;Attention;Memory;Safety/judgement                   Current Attention Level: Sustained Memory: Decreased short-term memory Following Commands: Follows one step commands with increased time;Follows multi-step commands inconsistently Safety/Judgement: Decreased awareness of safety;Decreased awareness of deficits Awareness: Emergent Problem Solving: Slow processing;Decreased initiation;Requires verbal cues General Comments: Pt appeared with poor attention and STM as seen by getting up to brush teeth and forgetting what he was doing less than 1 minute later. Pt required increased VCs throughout to attend and problem solve. Pt demonstrated decreased awareness of safety and deficits, reporting he could get up and go back to bed by himself.   General Comments  Pt's wife present during session.    Exercises     Shoulder Instructions      Home Living Family/patient expects to be discharged to:: Private residence Living Arrangements: Spouse/significant other Available Help at Discharge: Family Type of Home: House Home Access: Stairs to enter Technical brewer  of Steps: 3 Entrance Stairs-Rails: Right Home Layout: One level     Bathroom Shower/Tub: Occupational psychologist: Handicapped height     Home Equipment: Environmental consultant - 2 wheels;Shower seat          Prior Functioning/Environment Level of Independence: Independent        Comments: Pt worked for Curator.         OT Problem List: Decreased strength;Decreased range of motion;Decreased activity tolerance;Impaired balance (sitting and/or standing);Decreased cognition;Decreased safety awareness;Decreased knowledge of use of DME or AE;Decreased knowledge of precautions;Pain      OT Treatment/Interventions: Self-care/ADL training;Patient/family education;Balance training;DME and/or AE instruction;Therapeutic activities    OT Goals(Current goals can be found in the care plan section) Acute Rehab OT Goals Patient Stated Goal: to be able to walk better OT Goal Formulation: With patient/family Time For Goal Achievement: 10/09/19 Potential to Achieve Goals: Good  OT Frequency: Min 2X/week   Barriers to D/C:            Co-evaluation              AM-PAC OT "6 Clicks" Daily Activity     Outcome Measure Help from another person eating meals?: None Help from another person taking care of personal grooming?: A Little Help from another person toileting, which includes using toliet, bedpan, or urinal?: A Little Help from another person bathing (including washing, rinsing, drying)?: A Lot Help from another person to put on and taking off regular upper body clothing?: A Little Help from another person to put on and taking off regular lower body clothing?: A Lot 6 Click Score: 17   End of Session Equipment Utilized During Treatment: Gait belt;Rolling walker;Back brace Nurse Communication: Mobility status  Activity Tolerance: Patient tolerated treatment well Patient left: in chair;with call bell/phone within reach;with chair alarm set;with family/visitor  present  OT Visit Diagnosis: Unsteadiness on feet (R26.81);Other abnormalities of gait and mobility (R26.89);Muscle weakness (generalized) (M62.81);Other symptoms and signs involving cognitive function;Pain Pain - part of body: (back)                Time: [id number] OT Time Calculation (min): 26 min Charges:  OT General Charges $OT Visit: 1 Visit OT Evaluation $OT Eval Moderate Complexity: 1 Mod OT Treatments $Self Care/Home Management : 8-22 mins  Gus Rankin, OT Student  Gus Rankin 10/09/2019, 2:59 PM

## 2019-10-09 NOTE — Progress Notes (Signed)
Subjective: 1 Day Post-Op Procedure(s) (LRB): Posterior Lumbar fusion - Lumbar three to lumbar five (N/A) Patient reports pain as mild.  Left leg pain resolved. Has some mild right leg pain  Objective: Vital signs in last 24 hours: Temp:  [97.3 F (36.3 C)-99.7 F (37.6 C)] 98.4 F (36.9 C) (10/24 0726) Pulse Rate:  [74-108] 102 (10/24 0726) Resp:  [12-20] 12 (10/24 0726) BP: (110-147)/(63-90) 110/90 (10/24 0726) SpO2:  [85 %-98 %] 92 % (10/24 0726)  Intake/Output from previous day: 10/23 0701 - 10/24 0700 In: 1603 [P.O.:50; I.V.:1503] Out: 1000 [Urine:950; Blood:50] Intake/Output this shift: Total I/O In: -  Out: 200 [Urine:200]  No results for input(s): HGB in the last 72 hours. No results for input(s): WBC, RBC, HCT, PLT in the last 72 hours. No results for input(s): NA, K, CL, CO2, BUN, CREATININE, GLUCOSE, CALCIUM in the last 72 hours. No results for input(s): LABPT, INR in the last 72 hours.  Neurologically intact Neurovascular intact   Assessment/Plan: 1 Day Post-Op Procedure(s) (LRB): Posterior Lumbar fusion - Lumbar three to lumbar five (N/A) Advance diet Up with therapy D/C IV fluids Plan for discharge tomorrow   Gaynelle Arabian 10/09/2019, 9:28 AM

## 2019-10-09 NOTE — TOC Initial Note (Signed)
Transition of Care [facility]) - Initial/Assessment Note    Patient Details  Name: Aaron Burns MRN: [id number] Date of Birth: Jan 12, 1956  Transition of Care [facility]) CM/SW Contact:    Claudie Leach, RN Phone Number: 10/09/2019, 3:02 PM  Clinical Narrative:                 Spoke with wife regarding d/c plan and DME needs.  Wife states they do not need a 3n1 but she would like to have quad cane.  Per PT, quad cane is not a safe choice at present or when has progressed.  If not going to CIR, PT would recommend WC for home.  Wife states that she does not think patient is ready for d/c and pt should go to CIR.  She does not feel she can help him ambulate or help him if he falls.  She is aware that CIR admission process could take 2-3 more days.  She would like to discuss with doctor.   Expected Discharge Plan: [place]      Expected Discharge Plan and Services Expected Discharge Plan: [place]         Expected Discharge Date: 10/09/19                   Prior Living Arrangements/Services   Lives with:: Spouse Patient language and need for interpreter reviewed:: Yes        Need for Family Participation in Patient Care: Yes (Comment) Care giver support system in place?: Yes (comment)   Criminal Activity/Legal Involvement Pertinent to Current Situation/Hospitalization: Yes - Comment as needed  Activities of Daily Living Home Assistive Devices/Equipment: Walker (specify type), Brace (specify type)(back brace) ADL Screening (condition at time of admission) Patient's cognitive ability adequate to safely complete daily activities?: Yes Is the patient deaf or have difficulty hearing?: Yes Does the patient have difficulty seeing, even when wearing glasses/contacts?: No Does the patient have difficulty concentrating, remembering, or making decisions?: No Patient able to express need for assistance with ADLs?: Yes Does the patient have difficulty dressing  or bathing?: No Independently performs ADLs?: Yes (appropriate for developmental age) Does the patient have difficulty walking or climbing stairs?: Yes Weakness of Legs: Left Weakness of Arms/Hands: None     Emotional Assessment Appearance:: Appears stated age     Orientation: : Fluctuating Orientation (Suspected and/or reported Sundowners) Alcohol / Substance Use: Not Applicable Psych Involvement: No (comment)  Admission diagnosis:  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 FH: hypertension 11/26/2018  . Smoker 11/26/2018  . Morbid obesity (BMI 36.31)  06/07/2015  . Medication management 06/10/2014  . Vitamin D deficiency 11/30/2013  . Mixed hyperlipidemia 11/30/2013  . Hypertension   . Type 2 diabetes mellitus ([place])    PCP:  Unk Pinto, MD Pharmacy:   [place], [address] [place] [address] Phone: [phone] Fax: [phone]

## 2019-10-09 NOTE — Progress Notes (Addendum)
Physical Therapy Treatment Patient Details Name: Aaron Burns MRN: [id number] DOB: 11-Sep-1956 Today's Date: 10/09/2019    History of Present Illness Pt is a 63 y/o male s/p L3-4 ALIF and s/p L3-5 PLIF. PMH includes HTN and DM.     PT Comments    Pt making very slow progress. Requiring mod-maximal assist for bed mobility and transfers to standing (about 75% effort by PT, 25% effort by pt). Ambulating 15 feet with walker and close chair follow. Has significant bilateral lower extremity weakness (left > right), balance impairments, and decreased activity tolerance. Pt is also confused, received with urinary incontinence. Updated d/c plan in light of deficits and lack of significant progress.     Follow Up Recommendations  CIR;Supervision/Assistance - 24 hour     Equipment Recommendations  3in1 (PT)    Recommendations for Other Services       Precautions / Restrictions Precautions Precautions: Back Precaution Booklet Issued: No Precaution Comments: Verbally reviewed back precautions with pt.  Required Braces or Orthoses: Spinal Brace Spinal Brace: Lumbar corset;Applied in sitting position Restrictions Weight Bearing Restrictions: No    Mobility  Bed Mobility Overal bed mobility: Needs Assistance Bed Mobility: Rolling;Sidelying to Sit Rolling: Mod assist Sidelying to sit: Max assist       General bed mobility comments: ModA to roll towards right, maxA for trunk elevation up to sitting with assist for LLE negotiation out of bed  Transfers Overall transfer level: Needs assistance Equipment used: Rolling walker (2 wheeled) Transfers: Sit to/from Stand Sit to Stand: Mod assist;From elevated surface         General transfer comment: Heavy mod assist to power up from elevated bed surface, cues for hand placement  Ambulation/Gait Ambulation/Gait assistance: Min assist Gait Distance (Feet): 15 Feet Assistive device: Rolling walker (2 wheeled) Gait Pattern/deviations:  Step-through pattern;Decreased stride length;Step-to pattern;Narrow base of support;Shuffle;Decreased dorsiflexion - right;Decreased dorsiflexion - left Gait velocity: Decreased   General Gait Details: Very slow, guarded gait with close chair follow utilized. heavy reliance through arms on walker, with shuffle gait pattern. Cues for upright, activity pacing, sequencing and motor coordination   Stairs             Wheelchair Mobility    Modified Rankin (Stroke Patients Only)       Balance Overall balance assessment: Needs assistance Sitting-balance support: No upper extremity supported;Feet supported Sitting balance-Leahy Scale: Fair     Standing balance support: Bilateral upper extremity supported;During functional activity Standing balance-Leahy Scale: Poor Standing balance comment: Reliant on BUE support                             Cognition Arousal/Alertness: Awake/alert Behavior During Therapy: WFL for tasks assessed/performed Overall Cognitive Status: Impaired/Different from baseline Area of Impairment: Memory;Awareness;Problem solving                     Memory: Decreased short-term memory     Awareness: Emergent Problem Solving: Slow processing;Difficulty sequencing;Requires verbal cues;Requires tactile cues General Comments: Pt seems confused, received with urine incontinence, unable to recall precautions. Has decreased problem solving abilities.       Exercises      General Comments  BP 136/66 sitting EOB.       Pertinent Vitals/Pain Pain Assessment: Faces Faces Pain Scale: Hurts even more Pain Location: back Pain Descriptors / Indicators: Aching;Operative site guarding Pain Intervention(s): Monitored during session;Premedicated before session;Repositioned    Home Living  Prior Function            PT Goals (current goals can now be found in the care plan section) Acute Rehab PT Goals Patient  Stated Goal: to be able to walk better PT Goal Formulation: With patient Time For Goal Achievement: 10/22/19 Potential to Achieve Goals: Good Progress towards PT goals: Progressing toward goals    Frequency    Min 5X/week      PT Plan Discharge plan needs to be updated    Co-evaluation              AM-PAC PT "6 Clicks" Mobility   Outcome Measure  Help needed turning from your back to your side while in a flat bed without using bedrails?: A Lot Help needed moving from lying on your back to sitting on the side of a flat bed without using bedrails?: A Lot Help needed moving to and from a bed to a chair (including a wheelchair)?: A Little Help needed standing up from a chair using your arms (e.g., wheelchair or bedside chair)?: A Lot Help needed to walk in hospital room?: A Little Help needed climbing 3-5 steps with a railing? : A Lot 6 Click Score: 14    End of Session Equipment Utilized During Treatment: Gait belt;Back brace Activity Tolerance: Patient tolerated treatment well Patient left: in chair;with call bell/phone within reach;with chair alarm set Nurse Communication: Mobility status PT Visit Diagnosis: Unsteadiness on feet (R26.81);Muscle weakness (generalized) (M62.81);Pain Pain - part of body: (back)     Time: [id number] PT Time Calculation (min) (ACUTE ONLY): 27 min  Charges:  $Gait Training: 8-22 mins $Therapeutic Activity: 8-22 mins                     Ellamae Sia, PT, DPT Acute Rehabilitation Services Pager [phone] Office [phone]    Willy Eddy 10/09/2019, 10:40 AM

## 2019-10-10 LAB — GLUCOSE, CAPILLARY
Glucose-Capillary: 177 mg/dL — ABNORMAL HIGH (ref 70–99)
Glucose-Capillary: 190 mg/dL — ABNORMAL HIGH (ref 70–99)
Glucose-Capillary: 185 mg/dL — ABNORMAL HIGH (ref 70–99)
Glucose-Capillary: 230 mg/dL — ABNORMAL HIGH (ref 70–99)

## 2019-10-10 NOTE — Progress Notes (Addendum)
Subjective: 2 Days Post-Op Procedure(s) (LRB): Posterior Lumbar fusion - Lumbar three to lumbar five (N/A) Patient reports pain as moderate.  Just took some pain meds and feels better.  Walked the halls with PT this morning.  Wife is nervous about him coming home.  Tolerating a regular diet.  PT recommending CIR.  Objective: Vital signs in last 24 hours: Temp:  [98.2 F (36.8 C)-99.5 F (37.5 C)] 98.3 F (36.8 C) (10/25 0730) Pulse Rate:  [86-95] 86 (10/25 0730) Resp:  [14-20] 20 (10/25 0730) BP: (128-149)/(63-73) 131/73 (10/25 0730) SpO2:  [93 %-100 %] 98 % (10/25 0846)  Intake/Output from previous day: 10/24 0701 - 10/25 0700 In: 76.2 [I.V.:76.2] Out: 675 [Urine:675] Intake/Output this shift: Total I/O In: 240 [P.O.:240] Out: 0   No results for input(s): HGB in the last 72 hours. No results for input(s): WBC, RBC, HCT, PLT in the last 72 hours. No results for input(s): NA, K, CL, CO2, BUN, CREATININE, GLUCOSE, CALCIUM in the last 72 hours. No results for input(s): LABPT, INR in the last 72 hours.  PE:  wn wd male in nad.  A and O x 4.  Mood and affect normal.  B LEs with 5/5 strength in PF and DF of the ankle and toes.  5/5 strength at the quad bilat.  Feels LT throughout both feet.   Assessment/Plan: 2 Days Post-Op Procedure(s) (LRB): Posterior Lumbar fusion - Lumbar three to lumbar five (N/A)  Continue PT.  CIR consult ordered.       Wylene Simmer 10/10/2019, 10:25 AM

## 2019-10-10 NOTE — Progress Notes (Signed)
Patient wife called with concerns that Oxy IR last given on 11/24 @ 1133 made patient more confused. She would like it changed to  "Hydrocodone".

## 2019-10-10 NOTE — Progress Notes (Signed)
Physical Therapy Treatment Patient Details Name: Aaron Burns MRN: [id number] DOB: 08/14/1956 Today's Date: 10/10/2019    History of Present Illness Pt is a 63 y/o male s/p L3-4 ALIF and s/p L3-5 PLIF. PMH includes HTN and DM.     PT Comments    Pt in chair on arrival reports sitting for 2 hours this morning with pt educated for back precautions, with handout provided, brace wear (at his axilla on arrival), sitting limitations and all transfers and mobility. Pt with improved function this session with decreased cognition and awareness of restrictions. Pt would benefit from short CIR stay to maximize independence but could potentially return home with continued progression noted this session with 24hr supervision. Will continue to follow and encouraged mobility and ambulation with staff daily.  SpO2 98% on RA   Follow Up Recommendations  CIR;Supervision/Assistance - 24 hour     Equipment Recommendations  3in1 (PT)    Recommendations for Other Services       Precautions / Restrictions Precautions Precautions: Back;Fall Precaution Booklet Issued: Yes (comment) Required Braces or Orthoses: Spinal Brace Spinal Brace: Lumbar corset;Applied in sitting position    Mobility  Bed Mobility Overal bed mobility: Needs Assistance Bed Mobility: Sit to Sidelying Rolling: Min assist       Sit to sidelying: Mod assist General bed mobility comments: in recliner on arrival, returned to bed with assist to bring bil LE onto surface with max cues for sequence and precautions for mobility  Transfers Overall transfer level: Needs assistance   Transfers: Sit to/from Stand Sit to Stand: Supervision         General transfer comment: cues for hand placement and posture to rise from chair, lower and rise from toilet and lower to bed  Ambulation/Gait Ambulation/Gait assistance: Min assist Gait Distance (Feet): 150 Feet Assistive device: Rolling walker (2 wheeled) Gait Pattern/deviations:  Step-through pattern;Decreased stride length;Trunk flexed   Gait velocity interpretation: <1.8 ft/sec, indicate of risk for recurrent falls General Gait Details: cues for posture and position in rW. Pt with upper body reliance on RW and unable to achieve full trunk extension. Increased gait distance with slow speed   Stairs             Wheelchair Mobility    Modified Rankin (Stroke Patients Only)       Balance Overall balance assessment: Needs assistance   Sitting balance-Leahy Scale: Fair Sitting balance - Comments: EOB and toilet without UE assist   Standing balance support: Bilateral upper extremity supported;During functional activity;No upper extremity supported Standing balance-Leahy Scale: Fair Standing balance comment: able to static stand at sink to wash hand with bil UE support on RW for gait                            Cognition Arousal/Alertness: Awake/alert Behavior During Therapy: Flat affect Overall Cognitive Status: Impaired/Different from baseline Area of Impairment: Memory                   Current Attention Level: Selective Memory: Decreased short-term memory;Decreased recall of precautions Following Commands: Follows one step commands consistently Safety/Judgement: Decreased awareness of safety;Decreased awareness of deficits   Problem Solving: Slow processing General Comments: pt able to recall 1/3 precautions even after education, slow processing but oriented x 4      Exercises      General Comments        Pertinent Vitals/Pain Faces Pain Scale: Hurts little more Pain  Location: back Pain Descriptors / Indicators: Aching;Discomfort;Operative site guarding Pain Intervention(s): Limited activity within patient's tolerance;Premedicated before session;Monitored during session;Repositioned    Home Living                      Prior Function            PT Goals (current goals can now be found in the care plan  section) Progress towards PT goals: Progressing toward goals    Frequency    Min 5X/week      PT Plan Current plan remains appropriate    Co-evaluation              AM-PAC PT "6 Clicks" Mobility   Outcome Measure  Help needed turning from your back to your side while in a flat bed without using bedrails?: A Little Help needed moving from lying on your back to sitting on the side of a flat bed without using bedrails?: A Lot Help needed moving to and from a bed to a chair (including a wheelchair)?: A Little Help needed standing up from a chair using your arms (e.g., wheelchair or bedside chair)?: A Little Help needed to walk in hospital room?: A Little Help needed climbing 3-5 steps with a railing? : A Lot 6 Click Score: 16    End of Session Equipment Utilized During Treatment: Gait belt;Back brace Activity Tolerance: Patient tolerated treatment well Patient left: in bed;with call bell/phone within reach;with bed alarm set Nurse Communication: Mobility status;Precautions PT Visit Diagnosis: Unsteadiness on feet (R26.81);Muscle weakness (generalized) (M62.81);Other abnormalities of gait and mobility (R26.89)     Time: [id number] PT Time Calculation (min) (ACUTE ONLY): 28 min  Charges:  $Gait Training: 8-22 mins $Therapeutic Activity: 8-22 mins                     [place], PT Acute Rehabilitation Services Pager: [phone] Office: [place] 10/10/2019, 9:54 AM

## 2019-10-11 LAB — GLUCOSE, CAPILLARY
Glucose-Capillary: 211 mg/dL — ABNORMAL HIGH (ref 70–99)
Glucose-Capillary: 152 mg/dL — ABNORMAL HIGH (ref 70–99)
Glucose-Capillary: 161 mg/dL — ABNORMAL HIGH (ref 70–99)
Glucose-Capillary: 188 mg/dL — ABNORMAL HIGH (ref 70–99)

## 2019-10-11 MED ORDER — HYDROCODONE-ACETAMINOPHEN 10-325 MG PO TABS: 1.0000 | ORAL_TABLET | ORAL | 0 refills | Status: DC | PRN

## 2019-10-11 MED ORDER — HYDROCODONE-ACETAMINOPHEN 10-325 MG PO TABS
1.0000 | ORAL_TABLET | ORAL | Status: DC | PRN
  Administered 2019-10-12: 1 via ORAL
  Filled 2019-10-11: qty 1

## 2019-10-11 MED ORDER — HYDROCODONE-ACETAMINOPHEN 5-325 MG PO TABS
1.0000 | ORAL_TABLET | ORAL | Status: DC | PRN
  Administered 2019-10-11: 1 via ORAL
  Filled 2019-10-11: qty 1

## 2019-10-11 NOTE — Progress Notes (Signed)
Physical Therapy Treatment Patient Details Name: Aaron Burns MRN: [id number] DOB: 06/24/1956 Today's Date: 10/11/2019    History of Present Illness Pt is a 63 y/o male s/p L3-4 ALIF and s/p L3-5 PLIF. PMH includes HTN and DM.     PT Comments    Patient continues to require max cues and min assist for bed mobility technique to maintain back precautions. Despite multiple sessions and education, pt did not recall he needed to roll to his side prior to trying to sit up on EOB. (Wife did recall and began to correct/cue him). Patient required moderate cues during mobility for safe use of RW and to maintain back precautions. Wife with multiple questions related to bathing and dressing (and pt asking about toileting) and encouraged them to discuss with OT (scheduled to arrive at end of PT session). Patient's decreased cognition would required constant supervision and instruction to maintain back precautions and safe use of RW.     Follow Up Recommendations  CIR;Supervision/Assistance - 24 hour(decr cognition continues to impact ability to safely use RW )     Equipment Recommendations  None recommended by PT    Recommendations for Other Services       Precautions / Restrictions Precautions Precautions: Back;Fall Precaution Booklet Issued: Yes (comment)(previous session) Precaution Comments: Pt unable to state precautions besides no bending; required vc for no twisting and no bending during session Required Braces or Orthoses: Spinal Brace Spinal Brace: Lumbar corset;Applied in sitting position(wife and pt donned with min cues) Restrictions Weight Bearing Restrictions: No    Mobility  Bed Mobility Overal bed mobility: Needs Assistance Bed Mobility: Rolling;Sidelying to Sit Rolling: Min assist(no rail) Sidelying to sit: Min assist(no rail)       General bed mobility comments: pt attempting to exit bed with HOB fully upright, however began twisting immediately and re-instructed in  importance of supine to side to sit  Transfers Overall transfer level: Needs assistance Equipment used: Rolling walker (2 wheeled) Transfers: Sit to/from Stand Sit to Stand: Supervision         General transfer comment: cues for safe hand placement and safe use of RW with walker starting to tip (wife instructed if pt puts one hand on RW she needs to stabilize the RW)  Ambulation/Gait Ambulation/Gait assistance: Min guard Gait Distance (Feet): 120 Feet Assistive device: Rolling walker (2 wheeled) Gait Pattern/deviations: Step-through pattern;Decreased stride length;Trunk flexed Gait velocity: Decreased   General Gait Details: Patient initially using step-to pattern and required max cues and assist to progress to pushing RW while walking for smoother gait; cues for posture and position in rW. Pt with less upper body reliance on RW and able to achieve full upright posture (needed cues to maintain).    Stairs             Wheelchair Mobility    Modified Rankin (Stroke Patients Only)       Balance Overall balance assessment: Needs assistance Sitting-balance support: No upper extremity supported;Feet supported Sitting balance-Leahy Scale: Fair Sitting balance - Comments: EOB and toilet without UE assist   Standing balance support: During functional activity;No upper extremity supported Standing balance-Leahy Scale: Fair Standing balance comment: able to static stand without UE support                            Cognition Arousal/Alertness: Awake/alert Behavior During Therapy: Flat affect Overall Cognitive Status: Impaired/Different from baseline Area of Impairment: Memory;Safety/judgement;Awareness;Problem solving  Memory: Decreased short-term memory;Decreased recall of precautions Following Commands: Follows one step commands consistently Safety/Judgement: Decreased awareness of safety;Decreased awareness of  deficits Awareness: Intellectual Problem Solving: Slow processing General Comments: unclear if decr cognition is still related to pain medicine (he has had no narcotics for pain today)      Exercises      General Comments General comments (skin integrity, edema, etc.): Wife present; both asking appropriate questions. She wants to be sure pt understands and is adhering to his back precautions. She reports he is still not thinking as clearly as before surgery (he continues to require cues throughout session)      Pertinent Vitals/Pain Pain Assessment: 0-10 Pain Score: 4  Pain Location: back Pain Descriptors / Indicators: Aching;Discomfort;Operative site guarding Pain Intervention(s): Limited activity within patient's tolerance;Monitored during session    Home Living                      Prior Function            PT Goals (current goals can now be found in the care plan section) Acute Rehab PT Goals Patient Stated Goal: to be able to walk better Time For Goal Achievement: 10/22/19 Potential to Achieve Goals: Good Progress towards PT goals: Progressing toward goals    Frequency    Min 5X/week      PT Plan Current plan remains appropriate    Co-evaluation              AM-PAC PT "6 Clicks" Mobility   Outcome Measure  Help needed turning from your back to your side while in a flat bed without using bedrails?: A Little Help needed moving from lying on your back to sitting on the side of a flat bed without using bedrails?: A Little Help needed moving to and from a bed to a chair (including a wheelchair)?: A Little Help needed standing up from a chair using your arms (e.g., wheelchair or bedside chair)?: A Little Help needed to walk in hospital room?: A Little Help needed climbing 3-5 steps with a railing? : A Lot 6 Click Score: 17    End of Session Equipment Utilized During Treatment: Gait belt;Back brace Activity Tolerance: Patient tolerated treatment  well Patient left: Other (comment)(in bathroom with OT and wife)   PT Visit Diagnosis: Unsteadiness on feet (R26.81);Muscle weakness (generalized) (M62.81);Other abnormalities of gait and mobility (R26.89) Pain - part of body: (back)     Time: [id number] PT Time Calculation (min) (ACUTE ONLY): 29 min  Charges:  $Gait Training: 8-22 mins $Therapeutic Activity: 8-22 mins                       Barry Brunner, PT       Rexanne Mano 10/11/2019, 12:27 PM

## 2019-10-11 NOTE — Progress Notes (Signed)
Subjective: 3 Days Post-Op Procedure(s) (LRB): Posterior Lumbar fusion - Lumbar three to lumbar five (N/A) Patient reports pain as mild.   Wife at bedside expressing concerns that patient has been "confused" and that she thinks it is related to the oxycodone. States he has tolerated hydrocodone well in the past.  +ambulation +void +BM, flatus Tolerating PO w/o N/V. Denies CP, HA, dizziness, SOB, calf pain.  Objective: Vital signs in last 24 hours: Temp:  [97.7 F (36.5 C)-99.5 F (37.5 C)] 97.7 F (36.5 C) (10/26 0822) Pulse Rate:  [80-96] 84 (10/26 0822) Resp:  [18-20] 20 (10/26 0822) BP: (133-149)/(68-76) 142/76 (10/26 0822) SpO2:  [93 %-96 %] 94 % (10/26 0822)  Intake/Output from previous day: 10/25 0701 - 10/26 0700 In: 840 [P.O.:840] Out: 725 [Urine:725] Intake/Output this shift: Total I/O In: 480 [P.O.:480] Out: 1525 [Urine:1525]  No results for input(s): HGB in the last 72 hours. No results for input(s): WBC, RBC, HCT, PLT in the last 72 hours. No results for input(s): NA, K, CL, CO2, BUN, CREATININE, GLUCOSE, CALCIUM in the last 72 hours. No results for input(s): LABPT, INR in the last 72 hours.  Neurologically intact ABD soft Neurovascular intact Sensation intact distally Intact pulses distally Dorsiflexion/Plantar flexion intact Incision: scant drainage No cellulitis present Compartment soft   Assessment/Plan: 3 Days Post-Op Procedure(s) (LRB): Posterior Lumbar fusion - Lumbar three to lumbar five (N/A) Advance diet Up with therapy Will change pain meds from oxycodone to hydrocodone  Plan D/C to CIR.  DVT PPx: Teds, SCDs, ambulation  Yvonne Kendall Ward 10/11/2019, 3:39 PM

## 2019-10-11 NOTE — H&P (Signed)
Physical Medicine and Rehabilitation Admission H&P    Chief complaint: Back pain HPI: Aaron Burns is a 63 year old right-handed male history of diabetes mellitus, hypertension, morbid obesity with BMI 36.92, tobacco abuse, right total knee replacement 2016.  Presented 10/07/2019 with progressive low back pain radiating to the lower extremities.  X-rays and imaging revealed degenerative disc disease with slight scoliosis and radiculopathy L3-4 and 4-5.  Underwent posterior spinal fusion interbody decompression with oblique exposure L3-L5 10/08/2019 per Dr. Rolena Infante.  Hospital course pain management.  Back brace when out of bed applied in sitting position.  No postoperative labs were completed.  Therapy evaluations completed and patient was admitted for a comprehensive rehab program.  Patient reports pain with meds 1-2/10- however has been taking oxycodone which wife says makes him very confused and not following directions. Has also been getting up to bathroom on his own. Was changed to Norco in last 24 hours. Urinating well- LBM just now, while we were there; previous BM 1-2 days ago.  Review of Systems  Constitutional: Negative for chills and fever.  HENT: Negative for hearing loss.   Eyes: Negative for blurred vision and double vision.  Respiratory: Negative for cough and shortness of breath.   Cardiovascular: Negative for chest pain, palpitations and leg swelling.  Gastrointestinal: Negative for heartburn, nausea and vomiting.  Genitourinary: Positive for urgency. Negative for dysuria, flank pain and hematuria.  Musculoskeletal: Positive for back pain and myalgias.  Skin: Negative for rash.  All other systems reviewed and are negative.  Past Medical History:  Diagnosis Date   Diabetes mellitus, type 2 (HCC)    Elevated hemoglobin A1c    Heart murmur    at birth   History of kidney stones 2011   Hypertension    Low back pain    Radiates to left ankle.  Injured after falling  on back.   Morbid obesity (BMI 36. 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 Cornett, MD;  Location: [facility];  Service: General;  Laterality: N/A;   COLONOSCOPY     CYSTOSCOPY/RETROGRADE/URETEROSCOPY/STONE EXTRACTION WITH BASKET  5/11   HERNIA REPAIR     as a baby   KNEE SURGERY Right 11/25/11 and 2015   arthroscopic, cortisone injections done also   TOTAL KNEE ARTHROPLASTY Right 06/06/2015   Procedure: RIGHT TOTAL KNEE ARTHROPLASTY;  Surgeon: Paralee Cancel, MD;  Location: [facility];  Service: Orthopedics;  Laterality: Right;   WISDOM TOOTH EXTRACTION  yrs ago   Family History  Problem Relation Age of Onset   Hypertension Mother    Diabetes Mother    Kidney failure Mother    Diabetes Father    Diabetes Sister    Diabetes Brother    Social History:  reports that he quit smoking about 2 weeks ago. His smoking use included cigarettes. He has a 7.50 pack-year smoking history. He quit smokeless tobacco use about 5 years ago. He reports current alcohol use. He reports that he does not use drugs. Allergies:  Allergies  Allergen Reactions   Ppd [Tuberculin Purified Protein Derivative]     Pt states he is not allergic to this.Marland Kitchenpositive tb test 2014, pt took tb  treatment 2014   Medications Prior to Admission  Medication Sig Dispense Refill   aspirin 81 MG chewable tablet Chew 81 mg by mouth daily.     bisoprolol-hydrochlorothiazide (ZIAC) 5-6.25 MG tablet Take 1 tablet Daily for BP (Patient taking differently: Take 1 tablet by mouth daily. ) 90 tablet 1   Cholecalciferol (VITAMIN D3 MAXIMUM STRENGTH) 5000 UNITS capsule Take 10,000 Units by mouth  daily.      CINNAMON PO Take 1,000 mg by mouth daily.     Magnesium 250 MG TABS Take 250 mg by mouth at bedtime.      metFORMIN (GLUCOPHAGE-XR) 500 MG 24 hr tablet Take 1 tablet with Bkfst & Lunch & 2 tablets with Supper for Diabetes (Patient taking differently: Take 500-1,000 mg by mouth See admin instructions. Take 500 mg by mouth in the morning, 500 mg in the afternoon and 1000 mg in the evening) 360 tablet 0   Multiple Vitamin (MULTIVITAMIN WITH MINERALS) TABS Take 1 tablet by mouth daily.     phentermine (ADIPEX-P) 37.5 MG tablet Take 1/2 to 1 tablet every Morning for Dieting & Weight Loss (Patient taking differently: Take 18.75-37.5 mg by mouth daily before breakfast. ) 90 tablet 1   vitamin C (ASCORBIC ACID) 500 MG tablet Take 1,000 mg by mouth daily.     zinc gluconate 50 MG tablet Take 50 mg by mouth daily.      Drug Regimen Review Drug regimen was reviewed and remains appropriate with no significant issues identified  Home: Home Living Family/patient expects to be discharged to:: Private residence Living Arrangements: Spouse/significant other Available Help at Discharge: Family Type of Home: House Home Access: Stairs to enter Technical brewer of Steps: 3 Entrance Stairs-Rails: Right Home Layout: One level Bathroom Shower/Tub: Multimedia programmer: Handicapped height Spring: Environmental consultant - 2 wheels, Shower seat   Functional History: Prior Function Level of Independence: Independent Comments: Pt worked for [employer].   Functional Status:  Mobility: Bed Mobility Overal bed mobility: Needs Assistance Bed Mobility: Rolling, Sidelying to Sit Rolling: Min assist(no rail) Sidelying to sit: Min assist(no rail) Sit to sidelying: Mod assist General bed mobility comments: pt in bathroom with PT upon arrival Transfers Overall transfer level: Needs assistance Equipment used: Rolling walker (2 wheeled) Transfers: Sit to/from Stand Sit to  Stand: Min assist General transfer comment: Pt required min A to power up to stand, with VCs for hand placement Ambulation/Gait Ambulation/Gait assistance: Min guard Gait Distance (Feet): 120 Feet Assistive device: Rolling walker (2 wheeled) Gait Pattern/deviations: Step-through pattern, Decreased stride length, Trunk flexed General Gait Details: Patient initially using step-to pattern and required max cues and assist to progress to pushing RW while walking for smoother gait; cues for posture and position in rW. Pt with less upper body reliance on RW and able to achieve full upright posture (needed cues to maintain).  Gait velocity: Decreased Gait velocity interpretation: <1.8 ft/sec, indicate of risk for recurrent falls    ADL: ADL Overall ADL's : Needs assistance/impaired Eating/Feeding: Independent, Sitting Grooming: Wash/dry hands, Min guard, Standing Grooming Details (indicate cue type and reason): min guard A for safety Upper Body Bathing: Min guard, Sitting Lower Body Bathing: Minimal assistance, Sit to/from stand, Maximal assistance Lower Body Bathing Details (indicate cue type and reason): provided education for compensatory techniques for LB ADLs. Required min A and VCs for using strategies for peri-care. Pt requires mod A for rest of LB bathing Upper Body Dressing : Cueing for sequencing, Sitting, Minimal assistance  Upper Body Dressing Details (indicate cue type and reason): Provided education regarding brace management. Required min A and VCs for donning brace correctly Lower Body Dressing: Min guard, With adaptive equipment Lower Body Dressing Details (indicate cue type and reason): Provided education regarding compensatory techniques and AE for LB ADLs. Pt required VCs and supevision to maintain precautions Toilet Transfer: Minimal assistance, Ambulation, RW Toilet Transfer Details (indicate cue type and reason): Pt required min A to power up to stand. provided education on  hand placement to power up from toilet Toileting- Clothing Manipulation and Hygiene: Minimal assistance, Sit to/from stand Toileting - Clothing Manipulation Details (indicate cue type and reason): Min A for balance and peri-care after BM Functional mobility during ADLs: Rolling walker, Min guard General ADL Comments: provided education regarding compensatory techniques and AE for LB ADLs. Min guard-min A throughout  Cognition: Cognition Overall Cognitive Status: Impaired/Different from baseline Orientation Level: Oriented X4 Cognition Arousal/Alertness: Awake/alert Behavior During Therapy: WFL for tasks assessed/performed Overall Cognitive Status: Impaired/Different from baseline Area of Impairment: Memory, Safety/judgement, Awareness, Problem solving Current Attention Level: Selective Memory: Decreased short-term memory Following Commands: Follows one step commands consistently Safety/Judgement: Decreased awareness of safety, Decreased awareness of deficits Awareness: Emergent Problem Solving: Slow processing General Comments: Pt demonstrated decreased STM as seen by difficulty recalling precautions. Pt and wife had several questions regarding his care and ability to perform ADLs. Demonstrated slow processing and decreased problem solving. Required simple cues throughout session to follow one step commands  Physical Exam: Blood pressure 124/74, pulse 85, temperature 97.7 F (36.5 C), temperature source Oral, resp. rate 20, height 5\' 9"  (1.753 m), weight 113.4 kg, SpO2 94 %. Physical Exam  Nursing note and vitals reviewed. Constitutional: He appears well-developed and well-nourished. No distress.  Got up to go to bathroom ON HIS own, didn't even try to call for nurse; encouraged him to call for nurse; wife had to wipe his bottom after BM, originally sitting in chair at bedside, wearing LSO, NAD  HENT:  Head: Normocephalic and atraumatic.  Nose: Nose normal.  Mouth/Throat: Oropharynx  is clear and moist.  Eyes: Pupils are equal, round, and reactive to light. Conjunctivae and EOM are normal.  Neck: Normal range of motion. Neck supple. No thyromegaly present.  Cardiovascular:  RRR  Respiratory:  CTA B/L- no wheezes, rales, or rhonchi  GI:  Hypoactive (just had BM), soft, NT, ND  Musculoskeletal:     Comments: HFs 4+/5; otherwise 5/5 in UEs and LEs B/L  Neurological: He is alert.  Patient is alert sitting up in chair.  No complaints.  Oriented x3.  Follows full commands- impulsive- getting up to toilet on own  Skin: Skin is warm and dry.  Back incision is dressed.  Clean and dry- with honey comb dressing- in place on low back and L side; a tiny bit of dried blood under back dressing seen. 2/B/L hand IVs seen- no infiltration.  Psychiatric: He has a normal mood and affect. His behavior is normal.    Results for orders placed or performed during the hospital encounter of 10/07/19 (from the past 48 hour(s))  Glucose, capillary     Status: Abnormal   Collection Time: 10/10/19 12:41 PM  Result Value Ref Range   Glucose-Capillary 190 (H) 70 - 99 mg/dL  Glucose, capillary     Status: Abnormal   Collection Time: 10/10/19  6:33 PM  Result Value Ref Range   Glucose-Capillary 185 (H) 70 - 99 mg/dL   Comment 1 Notify RN  Comment 2 Document in Chart   Glucose, capillary     Status: Abnormal   Collection Time: 10/10/19  8:49 PM  Result Value Ref Range   Glucose-Capillary 230 (H) 70 - 99 mg/dL  Glucose, capillary     Status: Abnormal   Collection Time: 10/11/19  8:26 AM  Result Value Ref Range   Glucose-Capillary 211 (H) 70 - 99 mg/dL   Comment 1 Notify RN    Comment 2 Document in Chart   Glucose, capillary     Status: Abnormal   Collection Time: 10/11/19  1:31 PM  Result Value Ref Range   Glucose-Capillary 152 (H) 70 - 99 mg/dL   Comment 1 Notify RN    Comment 2 Document in Chart   Glucose, capillary     Status: Abnormal   Collection Time: 10/11/19  4:36 PM    Result Value Ref Range   Glucose-Capillary 161 (H) 70 - 99 mg/dL  Glucose, capillary     Status: Abnormal   Collection Time: 10/11/19  9:12 PM  Result Value Ref Range   Glucose-Capillary 188 (H) 70 - 99 mg/dL  Glucose, capillary     Status: Abnormal   Collection Time: 10/12/19  8:01 AM  Result Value Ref Range   Glucose-Capillary 205 (H) 70 - 99 mg/dL   Comment 1 Notify RN    Comment 2 Document in Chart    No results found.     Medical Problem List and Plan: 1.  Decreased functional mobility secondary to degenerative disc disease with slight scoliosis and radiculopathy L3-4 and 4-5.  Status post posterior instrumented fusion with oblique exposure L3-L5 10/08/2019.  Back brace with out of bed.  Plan suture removal 2 weeks from day of surgery. 2.  Antithrombotics: -DVT/anticoagulation: SCDs.  Check vascular study (per Ortho, doesn't feel comfortable with additional anticoagulation)  -antiplatelet therapy: N/A.  Patient on aspirin 81 mg daily prior to admission.  Will discuss  resuming with orthopedics Dr. Rolena Infante 3. Pain Management: Neurontin 300 mg 3 times daily, Norco, and Robaxin as needed 4. Mood: Provide emotional support  -antipsychotic agents: N/A 5. Neuropsych: This patient is capable of making decisions on his own behalf. 6. Skin/Wound Care: Routine skin checks 7. Fluids/Electrolytes/Nutrition: Routine in and outs with follow-up chemistries 8.  Diabetes mellitus.  Hemoglobin A1c 7.3.  SSI.   Glucophage 500 mg 1 tablet breakfast and 2 tablets at supper prior to admission. Will con't to monitor and titrate to get better control of BGs.   9.  Hypertension.Ziac 5-6.25 mg daily. Will monitor based on starting to mobilize    Cathlyn Parsons, PA-C 10/12/2019

## 2019-10-11 NOTE — Progress Notes (Signed)
Occupational Therapy Treatment Patient Details Name: Aaron Burns MRN: [id number] DOB: January 13, 1956 Today's Date: 10/11/2019    History of present illness Pt is a 63 y/o male s/p L3-4 ALIF and s/p L3-5 PLIF. PMH includes HTN and DM.    OT comments  Pt making progress towards goals. Pt continues to present with decreased balance, safety, knowledge of precautions, and increased pain. Pt's wife present throughout session, both pt and wife had questions regarding safe performance of ADLs. Provided education regarding compensatory strategies and AE for LB ADLs. Pt performed toileting with min A, and required min guard A for hand hygiene and using AE for LB ADLs. Continue to recommend dc to CIR to increase independence and safe performance of ADLs. Will continue to follow acutely as admitted.    Follow Up Recommendations  CIR    Equipment Recommendations  Other (comment)(defer to next venue)    Recommendations for Other Services PT consult    Precautions / Restrictions Precautions Precautions: Back;Fall Precaution Booklet Issued: Yes (comment) Precaution Comments: Pt recalled 1/3 precautions at beginning of session (ie. bending). Reviewed all precautions. When asked again at end of session, pt recalled no bending or twisting, required cues for lifting Required Braces or Orthoses: Spinal Brace Spinal Brace: Lumbar corset;Applied in sitting position Restrictions Weight Bearing Restrictions: No       Mobility Bed Mobility Overal bed mobility: Needs Assistance Bed Mobility: Rolling;Sidelying to Sit Rolling: Min assist(no rail) Sidelying to sit: Min assist(no rail)       General bed mobility comments: pt in bathroom with PT upon arrival  Transfers Overall transfer level: Needs assistance Equipment used: Rolling walker (2 wheeled) Transfers: Sit to/from Stand Sit to Stand: Min assist         General transfer comment: Pt required min A to power up to stand, with VCs for hand  placement    Balance Overall balance assessment: Needs assistance Sitting-balance support: No upper extremity supported;Feet supported Sitting balance-Leahy Scale: Fair Sitting balance - Comments: EOB and toilet without UE assist   Standing balance support: During functional activity;No upper extremity supported Standing balance-Leahy Scale: Fair Standing balance comment: able to maintain static standing at sink for short periods of time                           ADL either performed or assessed with clinical judgement   ADL Overall ADL's : Needs assistance/impaired     Grooming: Wash/dry hands;Min guard;Standing Grooming Details (indicate cue type and reason): min guard A for safety             Lower Body Dressing: Min guard;With adaptive equipment Lower Body Dressing Details (indicate cue type and reason): Provided education regarding compensatory techniques and AE for LB ADLs. Pt required VCs and supevision to maintain precautions Toilet Transfer: Minimal assistance;Ambulation;RW Toilet Transfer Details (indicate cue type and reason): Pt required min A to power up to stand. provided education on hand placement to power up from toilet Toileting- Clothing Manipulation and Hygiene: Minimal assistance;Sit to/from stand Toileting - Clothing Manipulation Details (indicate cue type and reason): Min A for balance and peri-care      Functional mobility during ADLs: Rolling walker;Min guard General ADL Comments: provided education regarding compensatory techniques and AE for LB ADLs. Min guard-min A throughout     Vision       Perception     Praxis      Cognition Arousal/Alertness: Awake/alert Behavior During Therapy: [facility]  for tasks assessed/performed Overall Cognitive Status: Impaired/Different from baseline Area of Impairment: Memory;Safety/judgement;Awareness;Problem solving                     Memory: Decreased short-term memory Following Commands:  Follows one step commands consistently Safety/Judgement: Decreased awareness of safety;Decreased awareness of deficits Awareness: Emergent Problem Solving: Slow processing General Comments: Pt demonstrated decreased STM as seen by difficulty recalling precautions. Pt and wife had several questions regarding his care and ability to perform ADLs. Demonstrated slow processing and decreased problem solving. Required simple cues throughout session to follow one step commands        Exercises     Shoulder Instructions       General Comments Wife present throughout. Pt and wife asking questions regarding safe performance of ADLs.    Pertinent Vitals/ Pain       Pain Assessment: Faces Pain Score: 4  Faces Pain Scale: Hurts a little bit Pain Location: back Pain Descriptors / Indicators: Aching;Discomfort;Operative site guarding Pain Intervention(s): Monitored during session;Repositioned  Home Living                                          Prior Functioning/Environment              Frequency  Min 2X/week        Progress Toward Goals  OT Goals(current goals can now be found in the care plan section)  Progress towards OT goals: Progressing toward goals  Acute Rehab OT Goals Patient Stated Goal: to be able to walk better OT Goal Formulation: With patient/family Time For Goal Achievement: 10/09/19 Potential to Achieve Goals: Good ADL Goals Pt Will Perform Grooming: with supervision;standing Pt Will Perform Upper Body Dressing: with supervision;sitting Pt Will Perform Lower Body Dressing: with supervision;sit to/from stand Pt Will Transfer to Toilet: with supervision;ambulating;bedside commode Pt Will Perform Toileting - Clothing Manipulation and hygiene: with supervision;sit to/from stand Pt Will Perform Tub/Shower Transfer: Shower transfer;with supervision;ambulating;shower seat;rolling walker Additional ADL Goal #1: Pt will verbalize 3/3 back  precautions with 1 VC Additional ADL Goal #2: Pt will perform log roll method for bed mobility in preparation for ADLs wtih supervision  Plan Discharge plan remains appropriate;Frequency remains appropriate    Co-evaluation                 AM-PAC OT "6 Clicks" Daily Activity     Outcome Measure   Help from another person eating meals?: None Help from another person taking care of personal grooming?: A Little Help from another person toileting, which includes using toliet, bedpan, or urinal?: A Little Help from another person bathing (including washing, rinsing, drying)?: A Little Help from another person to put on and taking off regular upper body clothing?: A Little Help from another person to put on and taking off regular lower body clothing?: A Little 6 Click Score: 19    End of Session Equipment Utilized During Treatment: Gait belt;Rolling walker;Back brace  OT Visit Diagnosis: Unsteadiness on feet (R26.81);Other abnormalities of gait and mobility (R26.89);Muscle weakness (generalized) (M62.81);Other symptoms and signs involving cognitive function;Pain Pain - part of body: (back)   Activity Tolerance Patient tolerated treatment well   Patient Left in chair;with call bell/phone within reach;with chair alarm set;with family/visitor present   Nurse Communication Mobility status        Time: [id number] OT Time Calculation (min): 22 min  Charges: OT General Charges $OT Visit: 1 Visit OT Treatments $Self Care/Home Management : 8-22 mins  Gus Rankin, OT Student  Di Kindle Kienan Doublin 10/11/2019, 1:11 PM

## 2019-10-11 NOTE — Consult Note (Signed)
Inpatient Rehab Admissions:  Inpatient Rehab Consult received.  I met with pt at the bedside for rehabilitation assessment. Discussed CIR program, expectations, estimated length of stay and anticipated assist level at DC. I have confirmed 24/7 A at DC from his wife. Anticipate pt will only require Supervision level at DC. Pt would like to pursue with CIR at this time. I will begin insurance authorization process for possible admit.   Please call if questions.   Kelly Wolfe, OTR/L  Rehab Admissions Coordinator  [phone] 10/11/2019 3:31 PM  

## 2019-10-12 ENCOUNTER — Inpatient Hospital Stay (HOSPITAL_COMMUNITY)
Admission: RE | Admit: 2019-10-12 | Discharge: 2019-10-19 | DRG: 561 | Disposition: A | Discharge status: Home Health | Payer: BC Managed Care – PPO | Source: Intra-hospital | Attending: Physical Medicine and Rehabilitation | Admitting: Physical Medicine and Rehabilitation

## 2019-10-12 ENCOUNTER — Inpatient Hospital Stay (HOSPITAL_COMMUNITY): Payer: BC Managed Care – PPO

## 2019-10-12 ENCOUNTER — Encounter (HOSPITAL_COMMUNITY): Payer: Self-pay | Admitting: Orthopedic Surgery

## 2019-10-12 ENCOUNTER — Other Ambulatory Visit: Payer: Self-pay

## 2019-10-12 DIAGNOSIS — I82461 Acute embolism and thrombosis of right calf muscular vein: Secondary | ICD-10-CM | POA: Diagnosis present

## 2019-10-12 DIAGNOSIS — Z87891 Personal history of nicotine dependence: Secondary | ICD-10-CM | POA: Diagnosis not present

## 2019-10-12 DIAGNOSIS — Z981 Arthrodesis status: Secondary | ICD-10-CM

## 2019-10-12 DIAGNOSIS — R936 Abnormal findings on diagnostic imaging of limbs: Secondary | ICD-10-CM | POA: Diagnosis not present

## 2019-10-12 DIAGNOSIS — Z96651 Presence of right artificial knee joint: Secondary | ICD-10-CM | POA: Diagnosis not present

## 2019-10-12 DIAGNOSIS — Z841 Family history of disorders of kidney and ureter: Secondary | ICD-10-CM | POA: Diagnosis not present

## 2019-10-12 DIAGNOSIS — E119 Type 2 diabetes mellitus without complications: Secondary | ICD-10-CM | POA: Diagnosis not present

## 2019-10-12 DIAGNOSIS — I82462 Acute embolism and thrombosis of left calf muscular vein: Secondary | ICD-10-CM | POA: Diagnosis not present

## 2019-10-12 DIAGNOSIS — Z833 Family history of diabetes mellitus: Secondary | ICD-10-CM

## 2019-10-12 DIAGNOSIS — Z86718 Personal history of other venous thrombosis and embolism: Secondary | ICD-10-CM | POA: Diagnosis not present

## 2019-10-12 DIAGNOSIS — Z7982 Long term (current) use of aspirin: Secondary | ICD-10-CM | POA: Diagnosis not present

## 2019-10-12 DIAGNOSIS — Z7984 Long term (current) use of oral hypoglycemic drugs: Secondary | ICD-10-CM

## 2019-10-12 DIAGNOSIS — M7989 Other specified soft tissue disorders: Secondary | ICD-10-CM

## 2019-10-12 DIAGNOSIS — I82409 Acute embolism and thrombosis of unspecified deep veins of unspecified lower extremity: Secondary | ICD-10-CM | POA: Diagnosis not present

## 2019-10-12 DIAGNOSIS — I10 Essential (primary) hypertension: Secondary | ICD-10-CM | POA: Diagnosis present

## 2019-10-12 DIAGNOSIS — Z4789 Encounter for other orthopedic aftercare: Secondary | ICD-10-CM | POA: Diagnosis not present

## 2019-10-12 DIAGNOSIS — Z8249 Family history of ischemic heart disease and other diseases of the circulatory system: Secondary | ICD-10-CM | POA: Diagnosis not present

## 2019-10-12 DIAGNOSIS — M4327 Fusion of spine, lumbosacral region: Secondary | ICD-10-CM

## 2019-10-12 DIAGNOSIS — M5416 Radiculopathy, lumbar region: Secondary | ICD-10-CM | POA: Diagnosis present

## 2019-10-12 LAB — GLUCOSE, CAPILLARY
Glucose-Capillary: 205 mg/dL — ABNORMAL HIGH (ref 70–99)
Glucose-Capillary: 163 mg/dL — ABNORMAL HIGH (ref 70–99)
Glucose-Capillary: 108 mg/dL — ABNORMAL HIGH (ref 70–99)
Glucose-Capillary: 206 mg/dL — ABNORMAL HIGH (ref 70–99)

## 2019-10-12 MED ORDER — ACETAMINOPHEN 325 MG PO TABS
650.0000 mg | ORAL_TABLET | ORAL | Status: DC | PRN
  Administered 2019-10-14 – 2019-10-16 (×2): 650 mg via ORAL
  Filled 2019-10-12 (×2): qty 2

## 2019-10-12 MED ORDER — METFORMIN HCL ER 500 MG PO TB24
1000.0000 mg | ORAL_TABLET | Freq: Every evening | ORAL | Status: DC
  Administered 2019-10-12 – 2019-10-18 (×7): 1000 mg via ORAL
  Filled 2019-10-12 (×7): qty 2

## 2019-10-12 MED ORDER — METFORMIN HCL ER 500 MG PO TB24
500.0000 mg | ORAL_TABLET | Freq: Every day | ORAL | Status: DC
  Administered 2019-10-13 – 2019-10-19 (×7): 500 mg via ORAL
  Filled 2019-10-12 (×7): qty 1

## 2019-10-12 MED ORDER — POLYETHYLENE GLYCOL 3350 17 G PO PACK: 17.0000 g | PACK | Freq: Every day | ORAL | Status: DC | PRN

## 2019-10-12 MED ORDER — ONDANSETRON HCL 4 MG/2ML IJ SOLN: 4.0000 mg | Freq: Four times a day (QID) | INTRAMUSCULAR | Status: DC | PRN

## 2019-10-12 MED ORDER — METHOCARBAMOL 1000 MG/10ML IJ SOLN
500.0000 mg | Freq: Four times a day (QID) | INTRAVENOUS | Status: DC | PRN
  Filled 2019-10-12: qty 5

## 2019-10-12 MED ORDER — HYDROCODONE-ACETAMINOPHEN 5-325 MG PO TABS
1.0000 | ORAL_TABLET | ORAL | Status: DC | PRN
  Administered 2019-10-12 – 2019-10-17 (×10): 1 via ORAL
  Filled 2019-10-12 (×11): qty 1

## 2019-10-12 MED ORDER — METFORMIN HCL ER 500 MG PO TB24
500.0000 mg | ORAL_TABLET | Freq: Every day | ORAL | Status: DC
  Administered 2019-10-13 – 2019-10-18 (×6): 500 mg via ORAL
  Filled 2019-10-12 (×7): qty 1

## 2019-10-12 MED ORDER — BISOPROLOL-HYDROCHLOROTHIAZIDE 5-6.25 MG PO TABS
1.0000 | ORAL_TABLET | Freq: Every day | ORAL | Status: DC
  Administered 2019-10-13 – 2019-10-19 (×7): 1 via ORAL
  Filled 2019-10-12 (×7): qty 1

## 2019-10-12 MED ORDER — INSULIN ASPART 100 UNIT/ML ~~LOC~~ SOLN
0.0000 [IU] | Freq: Three times a day (TID) | SUBCUTANEOUS | Status: DC
  Administered 2019-10-13: 3 [IU] via SUBCUTANEOUS
  Administered 2019-10-13 – 2019-10-16 (×6): 2 [IU] via SUBCUTANEOUS
  Administered 2019-10-16: 3 [IU] via SUBCUTANEOUS
  Administered 2019-10-17 – 2019-10-19 (×4): 2 [IU] via SUBCUTANEOUS

## 2019-10-12 MED ORDER — METHOCARBAMOL 500 MG PO TABS
500.0000 mg | ORAL_TABLET | Freq: Four times a day (QID) | ORAL | Status: DC | PRN
  Administered 2019-10-12 – 2019-10-18 (×3): 500 mg via ORAL
  Filled 2019-10-12 (×3): qty 1

## 2019-10-12 MED ORDER — ACETAMINOPHEN 650 MG RE SUPP: 650.0000 mg | RECTAL | Status: DC | PRN

## 2019-10-12 MED ORDER — GABAPENTIN 300 MG PO CAPS
300.0000 mg | ORAL_CAPSULE | Freq: Three times a day (TID) | ORAL | Status: DC
  Administered 2019-10-12 – 2019-10-14 (×5): 300 mg via ORAL
  Filled 2019-10-12 (×5): qty 1

## 2019-10-12 MED ORDER — ONDANSETRON HCL 4 MG PO TABS: 4.0000 mg | ORAL_TABLET | Freq: Four times a day (QID) | ORAL | Status: DC | PRN

## 2019-10-12 MED ORDER — HYDROCODONE-ACETAMINOPHEN 10-325 MG PO TABS
1.0000 | ORAL_TABLET | ORAL | Status: DC | PRN
  Filled 2019-10-12: qty 1

## 2019-10-12 MED ORDER — SORBITOL 70 % SOLN: 30.0000 mL | Freq: Every day | Status: DC | PRN

## 2019-10-12 NOTE — Discharge Summary (Signed)
Patient ID: Aaron Burns MRN: [id number] DOB/AGE: 1956-07-12 63 y.o.  Admit date: 10/07/2019 Discharge date: 10/12/2019  Admission Diagnoses:  Active Problems:   Fusion of lumbosacral spine   Discharge Diagnoses:  Active Problems:   Fusion of lumbosacral spine  status post Procedure(s): Posterior Lumbar fusion - Lumbar three to lumbar five  Past Medical History:  Diagnosis Date  . Diabetes mellitus, type 2 ([place])   . Elevated hemoglobin A1c   . Heart murmur    at birth  . History of kidney stones 2011  . Hypertension   . Low back pain    Radiates to left ankle.  Injured after falling on back.  . Morbid obesity (BMI 36. 06/07/2015  . 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 Renal disorder   . Tuberculosis    2014 dx, was treated with medications for about 4 months    Surgeries: Procedure(s): Posterior Lumbar fusion - Lumbar three to lumbar five on 10/08/2019   Consultants:   Discharged Condition: Improved  Hospital Course: Aaron Burns is an 63 y.o. 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 Patient failed conservative treatments (please see the history and physical for the specifics) and had severe unremitting pain that affects sleep, daily activities and work/hobbies. After pre-op clearance, the patient was taken to the operating room on 10/08/2019 and underwent  Procedure(s): Posterior Lumbar fusion - Lumbar three to lumbar five.    Patient was given perioperative antibiotics:  Anti-infectives (From admission, onward)   Start     Dose/Rate Route Frequency Ordered Stop   10/08/19 1600  ceFAZolin (ANCEF) IVPB 1 g/50 mL premix     1 g 100 mL/hr over 30 Minutes Intravenous Every 8 hours 10/08/19 1200 10/09/19 0729   10/08/19 0945  ceFAZolin (ANCEF) IVPB 2g/100 mL premix     2 g 200 mL/hr over 30 Minutes Intravenous  Once 10/08/19 0931  10/08/19 0805   10/07/19 2000  ceFAZolin (ANCEF) IVPB 1 g/50 mL premix     1 g 100 mL/hr over 30 Minutes Intravenous Every 8 hours 10/07/19 1555 10/08/19 1232   10/07/19 1530  ceFAZolin (ANCEF) IVPB 1 g/50 mL premix  Status:  Discontinued     1 g 100 mL/hr over 30 Minutes Intravenous Every 8 hours 10/07/19 1522 10/07/19 1555   10/07/19 0634  ceFAZolin (ANCEF) IVPB 2g/100 mL premix     2 g 200 mL/hr over 30 Minutes Intravenous 30 min pre-op 10/07/19 [id number] 10/07/19 1215       Patient was given sequential compression devices and early ambulation to prevent DVT.   Patient benefited maximally from hospital stay and there were no complications. At the time of discharge, the patient was urinating/moving their bowels without difficulty, tolerating a regular diet, pain is controlled with oral pain medications and they have been cleared by PT/OT with recommendations for CIR.   Recent vital signs:  Patient Vitals for the past 24 hrs:  BP Temp Temp src Pulse Resp SpO2  10/12/19 0803 124/74 97.7 F (36.5 C) Oral 85 20 94 %  10/12/19 0420 125/69 98.3 F (36.8 C) Oral 75 - 94 %  10/11/19 2308 (!) 134/59 98.3 F (36.8 C) Oral 80 - 96 %  10/11/19 1923 131/67 98.1 F (36.7 C) Oral 88 - 96 %  10/11/19 1638 140/69 98 F (36.7 C) - 85 20 97 %  Recent laboratory studies: No results for input(s): WBC, HGB, HCT, PLT, NA, K, CL, CO2, BUN, CREATININE, GLUCOSE, INR, CALCIUM in the last 72 hours.  Invalid input(s): PT, 2   Discharge Medications:   Allergies as of 10/12/2019      Reactions   Ppd [tuberculin Purified Protein Derivative]    Pt states he is not allergic to this.Marland Kitchenpositive tb test 2014, pt took tb treatment 2014      Medication List    STOP taking these medications   phentermine 37.5 MG tablet Commonly known as: ADIPEX-P     TAKE these medications   aspirin 81 MG chewable tablet Chew 81 mg by mouth daily.   bisoprolol-hydrochlorothiazide 5-6.25 MG tablet Commonly known as:  ZIAC Take 1 tablet Daily for BP What changed:   how much to take  how to take this  when to take this  additional instructions   CINNAMON PO Take 1,000 mg by mouth daily.   HYDROcodone-acetaminophen 10-325 MG tablet Commonly known as: NORCO Take 1 tablet by mouth every 4 (four) hours as needed for severe pain.   Magnesium 250 MG Tabs Take 250 mg by mouth at bedtime.   metFORMIN 500 MG 24 hr tablet Commonly known as: GLUCOPHAGE-XR Take 1 tablet with Bkfst & Lunch & 2 tablets with Supper for Diabetes What changed:   how much to take  how to take this  when to take this  additional instructions   methocarbamol 500 MG tablet Commonly known as: Robaxin Take 1 tablet (500 mg total) by mouth every 8 (eight) hours as needed for up to 5 days for muscle spasms.   multivitamin with minerals Tabs tablet Take 1 tablet by mouth daily.   ondansetron 4 MG tablet Commonly known as: Zofran Take 1 tablet (4 mg total) by mouth every 8 (eight) hours as needed for nausea or vomiting.   vitamin C 500 MG tablet Commonly known as: ASCORBIC ACID Take 1,000 mg by mouth daily.   Vitamin D3 Maximum Strength 125 MCG (5000 UT) capsule Generic drug: Cholecalciferol Take 10,000 Units by mouth daily.   zinc gluconate 50 MG tablet Take 50 mg by mouth daily.       Diagnostic Studies: Dg Chest 2 View  Result Date: 10/04/2019 CLINICAL DATA:  Preop back surgery. EXAM: CHEST - 2 VIEW COMPARISON:  12/19/2015 FINDINGS: The cardiomediastinal silhouette is within normal limits. The lungs are well inflated and clear. There is no evidence of pleural effusion or pneumothorax. No acute osseous abnormality is identified. IMPRESSION: No active cardiopulmonary disease. Electronically Signed   By: Logan Bores M.D.   On: 10/04/2019 09:26   Dg Lumbar Spine 2-3 Views  Result Date: 10/08/2019 CLINICAL DATA:  L3-L5 fusion. EXAM: LUMBAR SPINE - 2-3 VIEW; DG C-ARM 1-60 MIN COMPARISON:  Intraoperative  x-rays from yesterday. MRI lumbar spine dated June 18, 2018. FLUOROSCOPY TIME:  6 minutes, 13 seconds. C-arm fluoroscopic images were obtained intraoperatively and submitted for post operative interpretation. FINDINGS: Frontal and lateral intraoperative fluoroscopic images demonstrate interval L3-L5 posterior fusion. Unchanged interbody grafts at L3-L4 and L4-L5. No acute abnormality. IMPRESSION: Intraoperative fluoroscopic guidance for L3-L5 posterior fusion. Electronically Signed   By: Titus Dubin M.D.   On: 10/08/2019 10:30   Dg Lumbar Spine 2-3 Views  Result Date: 10/07/2019 CLINICAL DATA:  Intraoperative imaging for lumbar surgery. EXAM: DG C-ARM 1-60 MIN; LUMBAR SPINE - 2-3 VIEW COMPARISON:  MRI lumbar spine 06/18/2018. FINDINGS: Two fluoroscopic spot views of the lower lumbar spine are  provided and demonstrate interbody spacers in place at L3-4 and L4-5. No acute finding. IMPRESSION: Intraoperative imaging for L3-4 and L4-5 discectomy. Electronically Signed   By: Inge Rise M.D.   On: 10/07/2019 12:46   Dg C-arm 1-60 Min  Result Date: 10/08/2019 CLINICAL DATA:  L3-L5 fusion. EXAM: LUMBAR SPINE - 2-3 VIEW; DG C-ARM 1-60 MIN COMPARISON:  Intraoperative x-rays from yesterday. MRI lumbar spine dated June 18, 2018. FLUOROSCOPY TIME:  6 minutes, 13 seconds. C-arm fluoroscopic images were obtained intraoperatively and submitted for post operative interpretation. FINDINGS: Frontal and lateral intraoperative fluoroscopic images demonstrate interval L3-L5 posterior fusion. Unchanged interbody grafts at L3-L4 and L4-L5. No acute abnormality. IMPRESSION: Intraoperative fluoroscopic guidance for L3-L5 posterior fusion. Electronically Signed   By: Titus Dubin M.D.   On: 10/08/2019 10:30   Dg C-arm 1-60 Min  Result Date: 10/07/2019 CLINICAL DATA:  Intraoperative imaging for lumbar surgery. EXAM: DG C-ARM 1-60 MIN; LUMBAR SPINE - 2-3 VIEW COMPARISON:  MRI lumbar spine 06/18/2018. FINDINGS: Two  fluoroscopic spot views of the lower lumbar spine are provided and demonstrate interbody spacers in place at L3-4 and L4-5. No acute finding. IMPRESSION: Intraoperative imaging for L3-4 and L4-5 discectomy. Electronically Signed   By: Inge Rise M.D.   On: 10/07/2019 12:46    Discharge Instructions    Call MD / Call 911   Complete by: As directed    If you experience chest pain or shortness of breath, CALL 911 and be transported to the hospital emergency room.  If you develope a fever above 101 F, pus (white drainage) or increased drainage or redness at the wound, or calf pain, call your surgeon's office.   Constipation Prevention   Complete by: As directed    Drink plenty of fluids.  Prune juice may be helpful.  You may use a stool softener, such as Colace (over the counter) 100 mg twice a day.  Use MiraLax (over the counter) for constipation as needed.   Diet - low sodium heart healthy   Complete by: As directed    Incentive spirometry RT   Complete by: As directed    Incentive spirometry RT   Complete by: As directed    Increase activity slowly as tolerated   Complete by: As directed       Follow-up Information    Melina Schools, MD. Schedule an appointment as soon as possible for a visit in 2 weeks.   Specialty: Orthopedic Surgery Why: If symptoms worsen, For suture removal, For wound re-check Contact information: [address] [place] [address] [id number]           Discharge Plan:  discharge to CIR  Disposition: stable    Signed: Yvonne Kendall Ward for [facility] PA-C [facility] [phone] 10/12/2019, 11:29 AM

## 2019-10-12 NOTE — Progress Notes (Addendum)
Inpatient Rehabilitation-Admissions Coordinator   I have insurance approval for admit to CIR today. Pt still on board with pursuing CIR. Await medical clearance from Dr. Renea Ee and DC order.   Will follow up once that has been obtained.   Jhonnie Garner, OTR/L  Rehab Admissions Coordinator  [phone] 10/12/2019 11:23 AM   ADDENDUM: 111:48AM. I have medical clearance from attending service. Will proceed with admit to CIR. Will update RN and CM/SW regarding plan.   Jhonnie Garner, OTR/L  Rehab Admissions Coordinator  [phone] 10/12/2019 11:49 AM

## 2019-10-12 NOTE — Progress Notes (Signed)
Courtney Heys, MD  Physician  Physical Medicine and Rehabilitation  PMR Pre-admission  Signed  Date of Service:  10/12/2019 10:37 AM      Related encounter: Admission (Discharged) from 10/07/2019 in [facility]         PMR Admission Coordinator Pre-Admission Assessment  Patient: Aaron Burns is an 63 y.o., male MRN: [id number] DOB: 1956-04-16 Height: 5' 9"  (175.3 cm) Weight: 113.4 kg  Insurance Information HMO:     PPO: yes     PCP:      IPA:      80/20:      OTHER:  PRIMARY: [facility] [employer] [place]      Policy#: [id number]      Subscriber: Patient CM Name: automated fax from [employer]      Phone#: [phone]     Fax#: [phone] Pre-Cert#: [id number]      Employer:  Auth provided by automated fax on 10/26 for admit to CIR. Date of service: 10/26-11/2. Pt is approved for 7 days. Clinical updates due 11/2. No CM assigned at this time. Clinicals are to be faxed (f): [phone] Benefits:  Phone #: online     Name: [email or web address] Eff. Date: 12/16/2016- 12/16/2019     Deduct: $782 ($650 met)      Out of Pocket Max: $3,900 (includes deductible $1,183.12 met)      Life Max: NA CIR: 80% coverage, 20% co-insurance       SNF: 80% coverage, 20% co-insurance, limited to 120 days/cal yr Outpatient: limited by medical review   Co-Pay: $25/visit Home Health: 80% coverage, 20% co-insurance; limited to 120 visits/cal yr      DME: 80% coverage     Co-Pay: 20% co-insurance Providers:  SECONDARY: None      Policy#:       Subscriber:  CM Name:       Phone#:      Fax#:  Pre-Cert#:       Employer: Benefits:  Phone #:      Name:  Eff. Date:      Deduct:       Out of Pocket Max:       Life Max:  CIR:       SNF:  Outpatient:     Co-Pay:  Home Health:       Co-Pay:  DME:      Co-Pay:   Medicaid Application Date:       Case Manager:  Disability Application Date:       Case Worker:   The "Data Collection Information Summary" for  patients in Inpatient Rehabilitation Facilities with attached "Privacy Act [place] Records" was provided and verbally reviewed with: N/A  Emergency Contact Information         Contact Information    Name Relation Home Work [place] Spouse [phone]  [phone]      Current Medical History  Patient Admitting Diagnosis: lumbar stenosis and neuropathic leg pain History of Present Illness: Aaron Burns is a 63 year old male history of diabetes mellitus, hypertension, morbid obesity with BMI 36.92, tobacco abuse, right total knee replacement 2016. Presented 10/07/2019 with progressive low back pain radiating to the lower extremities. X-rays and imaging revealed degenerative disc disease with slight scoliosis and radiculopathy L3-4 and 4-5. Underwent posterior spinal fusion interbody decompressionwith oblique exposureL3-L5 10/08/2019 per Dr. Rolena Infante. Hospital course pain management. Back brace when out of bed applied in sitting position. No postoperative labs were  completed. Therapy evaluations completed and patient is to be admitted for a comprehensive rehab program on 10/12/2019.  Patient's medical record from [facility] has been reviewed by the rehabilitation admission coordinator and physician.  Past Medical History      Past Medical History:  Diagnosis Date  . Diabetes mellitus, type 2 ([place])   . Elevated hemoglobin A1c   . Heart murmur    at birth  . History of kidney stones 2011  . Hypertension   . Low back pain    Radiates to left ankle.  Injured after falling on back.  . Morbid obesity (BMI 36. 06/07/2015  . 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 Renal disorder   . Tuberculosis    2014 dx, was treated with medications for about 4 months    Family History   family history includes Diabetes in his brother, father, mother, and sister; Hypertension in his  mother; Kidney failure in his mother.  Prior Rehab/Hospitalizations Has the patient had prior rehab or hospitalizations prior to admission? Yes  Has the patient had major surgery during 100 days prior to admission? Yes             Current Medications  Current Facility-Administered Medications:  .  acetaminophen (TYLENOL) tablet 650 mg, 650 mg, Oral, Q4H PRN, 650 mg at 10/11/19 0532 **OR** acetaminophen (TYLENOL) suppository 650 mg, 650 mg, Rectal, Q4H PRN, Melina Schools, MD .  bisoprolol-hydrochlorothiazide [facility]) 5-6.25 MG per tablet 1 tablet, 1 tablet, Oral, Daily, Melina Schools, MD, 1 tablet at 10/12/19 [phone] .  gabapentin (NEURONTIN) capsule 300 mg, 300 mg, Oral, TID, Melina Schools, MD, 300 mg at 10/12/19 0839 .  HYDROcodone-acetaminophen (NORCO) 10-325 MG per tablet 1 tablet, 1 tablet, Oral, Q4H PRN, Ward, Yvonne Kendall, PA-C, 1 tablet at 10/12/19 0335 .  HYDROcodone-acetaminophen (NORCO/VICODIN) 5-325 MG per tablet 1 tablet, 1 tablet, Oral, Q4H PRN, Ward, Yvonne Kendall, PA-C, 1 tablet at 10/11/19 2045 .  HYDROmorphone (DILAUDID) injection 1 mg, 1 mg, Intravenous, Q2H PRN, Melina Schools, MD, 1 mg at 10/10/19 1009 .  insulin aspart (novoLOG) injection 0-15 Units, 0-15 Units, Subcutaneous, TID WC, Melina Schools, MD, 5 Units at 10/12/19 [phone] .  insulin aspart (novoLOG) injection 0-5 Units, 0-5 Units, Subcutaneous, QHS, Melina Schools, MD, 2 Units at 10/10/19 2057 .  lactated ringers infusion, , Intravenous, Continuous, Melina Schools, MD .  lactated ringers infusion, , Intravenous, Continuous, Melina Schools, MD .  magnesium citrate solution 1 Bottle, 1 Bottle, Oral, Once PRN, Melina Schools, MD .  menthol-cetylpyridinium (CEPACOL) lozenge 3 mg, 1 lozenge, Oral, PRN **OR** phenol (CHLORASEPTIC) mouth spray 1 spray, 1 spray, Mouth/Throat, PRN, Melina Schools, MD .  metFORMIN (GLUCOPHAGE-XR) 24 hr tablet 500 mg, 500 mg, Oral, Q breakfast, 500 mg at 10/12/19 0839 **AND** metFORMIN (GLUCOPHAGE-XR)  24 hr tablet 500 mg, 500 mg, Oral, Q1400, 500 mg at 10/11/19 1340 **AND** metFORMIN (GLUCOPHAGE-XR) 24 hr tablet 1,000 mg, 1,000 mg, Oral, QPM, Melina Schools, MD, 1,000 mg at 10/11/19 1725 .  methocarbamol (ROBAXIN) tablet 500 mg, 500 mg, Oral, Q6H PRN, 500 mg at 10/11/19 2044 **OR** methocarbamol (ROBAXIN) 500 mg in dextrose 5 % 50 mL IVPB, 500 mg, Intravenous, Q6H PRN, Melina Schools, MD .  ondansetron (ZOFRAN) tablet 4 mg, 4 mg, Oral, Q6H PRN **OR** ondansetron (ZOFRAN) injection 4 mg, 4 mg, Intravenous, Q6H PRN, Melina Schools, MD .  polyethylene glycol (MIRALAX / GLYCOLAX) packet 17 g, 17 g, Oral, Daily  PRN, Melina Schools, MD, 17 g at 10/10/19 0359 .  sodium phosphate (FLEET) 7-19 GM/118ML enema 1 enema, 1 enema, Rectal, Once PRN, Melina Schools, MD  Patients Current Diet:     Diet Order                  Diet - low sodium heart healthy         Diet Carb Modified Fluid consistency: Thin; Room service appropriate? Yes  Diet effective now               Precautions / Restrictions Precautions Precautions: Back, Fall Precaution Booklet Issued: Yes (comment) Precaution Comments: Pt recalled 1/3 precautions at beginning of session (ie. bending). Reviewed all precautions. When asked again at end of session, pt recalled no bending or twisting, required cues for lifting Spinal Brace: Lumbar corset, Applied in sitting position Restrictions Weight Bearing Restrictions: No   Has the patient had 2 or more falls or a fall with injury in the past year? No  Prior Activity Level Community (5-7x/wk): very active PTA, working as Glass blower/designer, drove PTA. no AD use PTA  Prior Functional Level Self Care: Did the patient need help bathing, dressing, using the toilet or eating? Independent  Indoor Mobility: Did the patient need assistance with walking from room to room (with or without device)? Independent  Stairs: Did the patient need assistance with internal or external  stairs (with or without device)? Independent  Functional Cognition: Did the patient need help planning regular tasks such as shopping or remembering to take medications? [place] / [place] Devices/Equipment: Environmental consultant (specify type), Brace (specify type)(back brace) Home Equipment: Walker - 2 wheels, Shower seat  Prior Device Use: Indicate devices/aids used by the patient prior to current illness, exacerbation or injury? None of the above  Current Functional Level Cognition  Overall Cognitive Status: Impaired/Different from baseline Current Attention Level: Selective Orientation Level: Oriented to person, Oriented to place, Oriented to situation Following Commands: Follows one step commands consistently Safety/Judgement: Decreased awareness of safety, Decreased awareness of deficits General Comments: Pt demonstrated decreased STM as seen by difficulty recalling precautions. Pt and wife had several questions regarding his care and ability to perform ADLs. Demonstrated slow processing and decreased problem solving. Required simple cues throughout session to follow one step commands    Extremity Assessment (includes Sensation/Coordination)  Upper Extremity Assessment: Overall WFL for tasks assessed  Lower Extremity Assessment: Defer to PT evaluation    ADLs  Overall ADL's : Needs assistance/impaired Eating/Feeding: Independent, Sitting Grooming: Wash/dry hands, Min guard, Standing Grooming Details (indicate cue type and reason): min guard A for safety Upper Body Bathing: Min guard, Sitting Lower Body Bathing: Minimal assistance, Sit to/from stand, Maximal assistance Lower Body Bathing Details (indicate cue type and reason): provided education for compensatory techniques for LB ADLs. Required min A and VCs for using strategies for peri-care. Pt requires mod A for rest of LB bathing Upper Body Dressing : Cueing for sequencing, Sitting,  Minimal assistance Upper Body Dressing Details (indicate cue type and reason): Provided education regarding brace management. Required min A and VCs for donning brace correctly Lower Body Dressing: Min guard, With adaptive equipment Lower Body Dressing Details (indicate cue type and reason): Provided education regarding compensatory techniques and AE for LB ADLs. Pt required VCs and supevision to maintain precautions Toilet Transfer: Minimal assistance, Ambulation, RW Toilet Transfer Details (indicate cue type and reason): Pt required min A to power up to stand. provided education  on hand placement to power up from toilet Toileting- Clothing Manipulation and Hygiene: Minimal assistance, Sit to/from stand Toileting - Clothing Manipulation Details (indicate cue type and reason): Min A for balance and peri-care after BM Functional mobility during ADLs: Rolling walker, Min guard General ADL Comments: provided education regarding compensatory techniques and AE for LB ADLs. Min guard-min A throughout    Mobility  Overal bed mobility: Needs Assistance Bed Mobility: Rolling, Sidelying to Sit Rolling: Min assist(no rail) Sidelying to sit: Min assist(no rail) Sit to sidelying: Mod assist General bed mobility comments: pt in bathroom with PT upon arrival    Transfers  Overall transfer level: Needs assistance Equipment used: Rolling walker (2 wheeled) Transfers: Sit to/from Stand Sit to Stand: Min assist General transfer comment: Pt required min A to power up to stand, with VCs for hand placement    Ambulation / Gait / Stairs / Wheelchair Mobility  Ambulation/Gait Ambulation/Gait assistance: Counsellor (Feet): 120 Feet Assistive device: Rolling walker (2 wheeled) Gait Pattern/deviations: Step-through pattern, Decreased stride length, Trunk flexed General Gait Details: Patient initially using step-to pattern and required max cues and assist to progress to pushing RW while  walking for smoother gait; cues for posture and position in rW. Pt with less upper body reliance on RW and able to achieve full upright posture (needed cues to maintain).  Gait velocity: Decreased Gait velocity interpretation: <1.8 ft/sec, indicate of risk for recurrent falls    Posture / Balance Dynamic Sitting Balance Sitting balance - Comments: EOB and toilet without UE assist Balance Overall balance assessment: Needs assistance Sitting-balance support: No upper extremity supported, Feet supported Sitting balance-Leahy Scale: Fair Sitting balance - Comments: EOB and toilet without UE assist Standing balance support: During functional activity, No upper extremity supported Standing balance-Leahy Scale: Fair Standing balance comment: able to maintain static standing at sink for short periods of time    Special needs/care consideration BiPAP/CPAP : no CPM : no Continuous Drip IV : no Dialysis : no        Days : no Life Vest : no Oxygen : no, on RA Special Bed : no Trach Size : no Wound Vac (area) : no      Location : no Skin : surgical incision to back and abdomen     Bowel mgmt: last BM: 10/11/2019  Bladder mgmt: incontinent Diabetic mgmt: yes Behavioral consideration : no Chemo/radiation : no   Previous Home Environment (from acute therapy documentation) Living Arrangements: Spouse/significant other Available Help at Discharge: Family Type of Home: House Home Layout: One level Home Access: Stairs to enter Entrance Stairs-Rails: Right Entrance Stairs-Number of Steps: 3 Bathroom Shower/Tub: Multimedia programmer: Handicapped height Home Care Services: No  Discharge Living Setting Plans for Discharge Living Setting: Patient's home, Lives with (comment)(spouse) Type of Home at Discharge: House Discharge Home Layout: One level Discharge Home Access: Stairs to enter Entrance Stairs-Rails: None Entrance Stairs-Number of Steps: 2 Discharge Bathroom Shower/Tub:  Tub/shower unit(and walk in) Discharge Bathroom Toilet: Standard Discharge Bathroom Accessibility: Yes How Accessible: Accessible via walker Does the patient have any problems obtaining your medications?: No  Social/Family/Support Systems Patient Roles: Spouse, Other (Comment)(full time employee) Contact Information: Spouse: Hilda Blades [phone]) Anticipated Caregiver: Hilda Blades Anticipated Caregiver's Contact Information: see above Ability/Limitations of Caregiver: Supervision Caregiver Availability: 24/7 Discharge Plan Discussed with Primary Caregiver: Yes Is Caregiver In Agreement with Plan?: Yes Does Caregiver/Family have Issues with Lodging/Transportation while Pt is in Rehab?: No  Goals/Additional Needs Patient/Family Goal for Rehab:  PT/OT: Mod I; SLP: NA Expected length of stay: 5-7 days Cultural Considerations: NA Dietary Needs: carb modified; thin liquids; calorie level medium 1600-2000 Equipment Needs: TBD Pt/Family Agrees to Admission and willing to participate: Yes Program Orientation Provided & Reviewed with Pt/Caregiver Including Roles  & Responsibilities: Yes(wife)  Barriers to Discharge: Home environment access/layout  Barriers to Discharge Comments: steps to enter  Decrease burden of Care through IP rehab admission: NA  Possible need for SNF placement upon discharge: Not anticipated. Pt has good social support from his spouse who can provide supervision at DC. Anticipate pt will have a good prognosis for further progress through CIR.   Patient Condition: I have reviewed medical records from [facility], spoken with CM, PA, and patient and spouse. I met with patient at the bedside for inpatient rehabilitation assessment.  Patient will benefit from ongoing PT and OT, can actively participate in 3 hours of therapy a day 5 days of the week, and can make measurable gains during the admission.  Patient will also benefit from the coordinated team approach  during an Inpatient Acute Rehabilitation admission.  The patient will receive intensive therapy as well as Rehabilitation physician, nursing, social worker, and care management interventions.  Due to safety, skin/wound care, disease management, medication administration, pain management and patient education the patient requires 24 hour a day rehabilitation nursing.  The patient is currently Min G with mobility and Min G/MIn A for basic ADLs.  Discharge setting and therapy post discharge at home with home health is anticipated.  Patient has agreed to participate in the Acute Inpatient Rehabilitation Program and will admit today.  Preadmission Screen Completed By:  Raechel Ache, 10/12/2019 11:48 AM ______________________________________________________________________   Discussed status with Dr. Dagoberto Ligas on 10/12/2019 at 11:47AM and received approval for admission today.  Admission Coordinator:  Raechel Ache, OT, time 11:47AM/Date 10/12/2019   Assessment/Plan: Diagnosis: 1. Does the need for close, 24 hr/day Medical supervision in concert with the patient's rehab needs make it unreasonable for this patient to be served in a less intensive setting? Yes 2. Co-Morbidities requiring supervision/potential complications: Confusion, post op pain, HTN, DM  3. Due to safety, skin/wound care, disease management, medication administration, pain management and patient education, does the patient require 24 hr/day rehab nursing? Yes 4. Does the patient require coordinated care of a physician, rehab nurse, PT, OT, and SLP to address physical and functional deficits in the context of the above medical diagnosis(es)? Yes Addressing deficits in the following areas: balance, endurance, locomotion, strength, transferring, bathing, dressing, grooming, toileting and cognition 5. Can the patient actively participate in an intensive therapy program of at least 3 hrs of therapy 5 days a week? Yes 6. The potential for  patient to make measurable gains while on inpatient rehab is good 7. Anticipated functional outcomes upon discharge from inpatient rehab: modified independent PT, modified independent OT, n/a SLP 8. Estimated rehab length of stay to reach the above functional goals is: 5-7 days 9. Anticipated discharge destination: Home 10. Overall Rehab/Functional Prognosis: good   MD Signature:         Revision History Date/Time User Provider Type Action  10/12/2019 12:22 PM Courtney Heys, MD Physician Sign  10/12/2019 11:48 AM Raechel Ache, OT Rehab Admission Coordinator Share  View Details Report

## 2019-10-12 NOTE — Progress Notes (Signed)
Pt admitted to 4M09. Pt is alert and oriented. Pt with wife at bedside. Pt has bed alarm in place.

## 2019-10-12 NOTE — Progress Notes (Signed)
VASCULAR LAB PRELIMINARY  PRELIMINARY  PRELIMINARY  PRELIMINARY  Bilateral lower extremity venous duplex completed.    Preliminary report:  See CV proc for preliminary results.    Christiaan Strebeck, RVT 10/12/2019, 6:11 PM

## 2019-10-12 NOTE — Progress Notes (Signed)
Received a call from [facility] regarding Bilateral DVT Results:    Summary: Right: Findings consistent with acute deep vein thrombosis involving the right gastrocnemius veins. Left: Findings consistent with acute deep vein thrombosis involving the left gastrocnemius veins.   Per Dr. Dagoberto Ligas note, her note was Reviewed:  2.  Antithrombotics: DVT/anticoagulation: SCDs.  Check vascular study (per Ortho, doesn't feel comfortable with additional anticoagulation) The above was discussed with Dr Naaman Plummer , we will continue to monitor.  Placed a call to Hyacinth Meeker LPN regarding the above she verbalizes understanding.

## 2019-10-12 NOTE — Progress Notes (Signed)
    Subjective: Procedure(s) (LRB): Posterior Lumbar fusion - Lumbar three to lumbar five (N/A) 4 Days Post-Op  Patient reports pain as 2 on 0-10 scale.  Reports decreased leg pain reports incisional back pain   Positive void Positive bowel movement Positive flatus Negative chest pain or shortness of breath  Objective: Vital signs in last 24 hours: Temp:  [97.7 F (36.5 C)-98.3 F (36.8 C)] 98.3 F (36.8 C) (10/27 0420) Pulse Rate:  [75-88] 75 (10/27 0420) Resp:  [20] 20 (10/26 1638) BP: (125-142)/(59-76) 125/69 (10/27 0420) SpO2:  [94 %-97 %] 94 % (10/27 0420)  Intake/Output from previous day: 10/26 0701 - 10/27 0700 In: 780 [P.O.:780] Out: 2125 [Urine:2125]  Labs: No results for input(s): WBC, RBC, HCT, PLT in the last 72 hours. No results for input(s): NA, K, CL, CO2, BUN, CREATININE, GLUCOSE, CALCIUM in the last 72 hours. No results for input(s): LABPT, INR in the last 72 hours.  Physical Exam: Neurologically intact ABD soft Intact pulses distally Dorsiflexion/Plantar flexion intact Incision: dressing C/D/I and no drainage Compartment soft Body mass index is 36.92 kg/m.   Assessment/Plan: Patient stable  xrays n/a Continue mobilization with physical therapy Continue care  Advance diet Up with therapy  1.  Plan on transfer to [facility] inpatient rehab. 2.  Patient overall is doing exceptionally well.  I am pleased with his progress. 3.  I will continue to monitor his progress on the CIR.  Plan on changing dressings and removing sutures 2 weeks from surgery.   Melina Schools, MD [facility] [phone]

## 2019-10-12 NOTE — Progress Notes (Signed)
Inpatient Rehabilitation  Patient information reviewed and entered into eRehab system by Vaishali Baise M. Letitia Sabala, M.A., CCC/SLP, PPS Coordinator.  Information including medical coding, functional ability and quality indicators will be reviewed and updated through discharge.    

## 2019-10-12 NOTE — TOC Transition Note (Signed)
Transition of Care [facility]) - CM/SW Discharge Note Marvetta Gibbons RN,BSN Transitions of Care Unit 4NP (non trauma) - RN Case Manager [phone]   Patient Details  Name: Aaron Burns MRN: [id number] Date of Birth: 11/01/1956  Transition of Care [facility]) CM/SW Contact:  Dawayne Patricia, RN Phone Number: 10/12/2019, 12:28 PM   Clinical Narrative:    Pt stable for transition to CIR today- notified by Claiborne Billings with [facility] that pt has received insurance approval for CIR. Bed available and pt will transition to CIR later today.    Final next level of care: IP Rehab Facility Barriers to Discharge: Barriers Resolved   Patient Goals and CMS Choice Patient states their goals for this hospitalization and ongoing recovery are:: rehab CMS Medicare.gov Compare Post Acute Care list provided to:: Patient Choice offered to / list presented to : Patient, Spouse  Discharge Placement             [facility] rehab          Discharge Plan and Services   Discharge Planning Services: CM Consult Post Acute Care Choice: IP Rehab          DME Arranged: N/A DME Agency: NA       HH Arranged: NA HH Agency: NA        Social Determinants of Health (SDOH) Interventions     Readmission Risk Interventions Readmission Risk Prevention Plan 10/12/2019  Post Dischage Appt Complete  Medication Screening Complete  Transportation Screening Complete  Some recent data might be hidden

## 2019-10-12 NOTE — Progress Notes (Signed)
BL DVT noted, On call NP notified.

## 2019-10-12 NOTE — PMR Pre-admission (Signed)
PMR Admission Coordinator Pre-Admission Assessment  Patient: Aaron Burns is an 63 y.o., male MRN: [id number] DOB: 1956/09/15 Height: 5' 9"  (175.3 cm) Weight: 113.4 kg  Insurance Information HMO:     PPO: yes     PCP:      IPA:      80/20:      OTHER:  PRIMARY: Aaron Burns      Policy#: [id number]      Subscriber: Patient CM Name: automated fax from [employer]      Phone#: [phone]     Fax#: [phone] Pre-Cert#: [id number]      Employer:  Auth provided by automated fax on 10/26 for admit to CIR. Date of service: 10/26-11/2. Pt is approved for 7 days. Clinical updates due 11/2. No CM assigned at this time. Clinicals are to be faxed (f): [phone] Benefits:  Phone #: online     Name: [email or web address] Eff. Date: 12/16/2016- 12/16/2019     Deduct: $010 ($650 met)      Out of Pocket Max: $3,900 (includes deductible $1,183.12 met)      Life Max: NA CIR: 80% coverage, 20% co-insurance       SNF: 80% coverage, 20% co-insurance, limited to 120 days/cal yr Outpatient: limited by medical review   Co-Pay: $25/visit Home Health: 80% coverage, 20% co-insurance; limited to 120 visits/cal yr      DME: 80% coverage     Co-Pay: 20% co-insurance Providers:  SECONDARY: None      Policy#:       Subscriber:  CM Name:       Phone#:      Fax#:  Pre-Cert#:       Employer: Benefits:  Phone #:      Name:  Eff. Date:      Deduct:       Out of Pocket Max:       Life Max:  CIR:       SNF:  Outpatient:     Co-Pay:  Home Health:       Co-Pay:  DME:      Co-Pay:   Medicaid Application Date:       Case Manager:  Disability Application Date:       Case Worker:   The "Data Collection Information Summary" for patients in Inpatient Rehabilitation Facilities with attached "Privacy Act [place] Records" was provided and verbally reviewed with: N/A  Emergency Contact Information Contact Information    Name Relation Home Work Brenas Spouse [phone]   [phone]      Current Medical History  Patient Admitting Diagnosis: lumbar stenosis and neuropathic leg pain History of Present Illness: Aaron Burns is a 63 year old male history of diabetes mellitus, hypertension, morbid obesity with BMI 36.92, tobacco abuse, right total knee replacement 2016.  Presented 10/07/2019 with progressive low back pain radiating to the lower extremities.  X-rays and imaging revealed degenerative disc disease with slight scoliosis and radiculopathy L3-4 and 4-5.  Underwent posterior spinal fusion interbody decompression with oblique exposure L3-L5 10/08/2019 per Dr. Rolena Infante.  Hospital course pain management.  Back brace when out of bed applied in sitting position.  No postoperative labs were completed.  Therapy evaluations completed and patient is to be admitted for a comprehensive rehab program on 10/12/2019.    Patient's medical record from [facility] has been reviewed by the rehabilitation admission coordinator and physician.  Past Medical History  Past Medical History:  Diagnosis Date  . Diabetes mellitus, type  2 ([place])   . Elevated hemoglobin A1c   . Heart murmur    at birth  . History of kidney stones 2011  . Hypertension   . Low back pain    Radiates to left ankle.  Injured after falling on back.  . Morbid obesity (BMI 36. 06/07/2015  . 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 Renal disorder   . Tuberculosis    2014 dx, was treated with medications for about 4 months    Family History   family history includes Diabetes in his brother, father, mother, and sister; Hypertension in his mother; Kidney failure in his mother.  Prior Rehab/Hospitalizations Has the patient had prior rehab or hospitalizations prior to admission? Yes  Has the patient had major surgery during 100 days prior to admission? Yes   Current Medications  Current Facility-Administered Medications:  .   acetaminophen (TYLENOL) tablet 650 mg, 650 mg, Oral, Q4H PRN, 650 mg at 10/11/19 0532 **OR** acetaminophen (TYLENOL) suppository 650 mg, 650 mg, Rectal, Q4H PRN, Melina Schools, MD .  bisoprolol-hydrochlorothiazide [facility]) 5-6.25 MG per tablet 1 tablet, 1 tablet, Oral, Daily, Melina Schools, MD, 1 tablet at 10/12/19 [phone] .  gabapentin (NEURONTIN) capsule 300 mg, 300 mg, Oral, TID, Melina Schools, MD, 300 mg at 10/12/19 0839 .  HYDROcodone-acetaminophen (NORCO) 10-325 MG per tablet 1 tablet, 1 tablet, Oral, Q4H PRN, Ward, Yvonne Kendall, PA-C, 1 tablet at 10/12/19 0335 .  HYDROcodone-acetaminophen (NORCO/VICODIN) 5-325 MG per tablet 1 tablet, 1 tablet, Oral, Q4H PRN, Ward, Yvonne Kendall, PA-C, 1 tablet at 10/11/19 2045 .  HYDROmorphone (DILAUDID) injection 1 mg, 1 mg, Intravenous, Q2H PRN, Melina Schools, MD, 1 mg at 10/10/19 1009 .  insulin aspart (novoLOG) injection 0-15 Units, 0-15 Units, Subcutaneous, TID WC, Melina Schools, MD, 5 Units at 10/12/19 [phone] .  insulin aspart (novoLOG) injection 0-5 Units, 0-5 Units, Subcutaneous, QHS, Melina Schools, MD, 2 Units at 10/10/19 2057 .  lactated ringers infusion, , Intravenous, Continuous, Melina Schools, MD .  lactated ringers infusion, , Intravenous, Continuous, Melina Schools, MD .  magnesium citrate solution 1 Bottle, 1 Bottle, Oral, Once PRN, Melina Schools, MD .  menthol-cetylpyridinium (CEPACOL) lozenge 3 mg, 1 lozenge, Oral, PRN **OR** phenol (CHLORASEPTIC) mouth spray 1 spray, 1 spray, Mouth/Throat, PRN, Melina Schools, MD .  metFORMIN (GLUCOPHAGE-XR) 24 hr tablet 500 mg, 500 mg, Oral, Q breakfast, 500 mg at 10/12/19 0839 **AND** metFORMIN (GLUCOPHAGE-XR) 24 hr tablet 500 mg, 500 mg, Oral, Q1400, 500 mg at 10/11/19 1340 **AND** metFORMIN (GLUCOPHAGE-XR) 24 hr tablet 1,000 mg, 1,000 mg, Oral, QPM, Melina Schools, MD, 1,000 mg at 10/11/19 1725 .  methocarbamol (ROBAXIN) tablet 500 mg, 500 mg, Oral, Q6H PRN, 500 mg at 10/11/19 2044 **OR** methocarbamol (ROBAXIN)  500 mg in dextrose 5 % 50 mL IVPB, 500 mg, Intravenous, Q6H PRN, Melina Schools, MD .  ondansetron (ZOFRAN) tablet 4 mg, 4 mg, Oral, Q6H PRN **OR** ondansetron (ZOFRAN) injection 4 mg, 4 mg, Intravenous, Q6H PRN, Melina Schools, MD .  polyethylene glycol (MIRALAX / GLYCOLAX) packet 17 g, 17 g, Oral, Daily PRN, Melina Schools, MD, 17 g at 10/10/19 0359 .  sodium phosphate (FLEET) 7-19 GM/118ML enema 1 enema, 1 enema, Rectal, Once PRN, Melina Schools, MD  Patients Current Diet:  Diet Order            Diet - low sodium heart healthy        Diet Carb Modified Fluid consistency: Thin; Room service  appropriate? Yes  Diet effective now              Precautions / Restrictions Precautions Precautions: Back, Fall Precaution Booklet Issued: Yes (comment) Precaution Comments: Pt recalled 1/3 precautions at beginning of session (ie. bending). Reviewed all precautions. When asked again at end of session, pt recalled no bending or twisting, required cues for lifting Spinal Brace: Lumbar corset, Applied in sitting position Restrictions Weight Bearing Restrictions: No   Has the patient had 2 or more falls or a fall with injury in the past year? No  Prior Activity Level Community (5-7x/wk): very active PTA, working as Glass blower/designer, drove PTA. no AD use PTA  Prior Functional Level Self Care: Did the patient need help bathing, dressing, using the toilet or eating? Independent  Indoor Mobility: Did the patient need assistance with walking from room to room (with or without device)? Independent  Stairs: Did the patient need assistance with internal or external stairs (with or without device)? Independent  Functional Cognition: Did the patient need help planning regular tasks such as shopping or remembering to take medications? [place] Devices/Equipment: Environmental consultant (specify type), Brace (specify type)(back brace) Home Equipment: Walker - 2  wheels, Shower seat  Prior Device Use: Indicate devices/aids used by the patient prior to current illness, exacerbation or injury? None of the above  Current Functional Level Cognition  Overall Cognitive Status: Impaired/Different from baseline Current Attention Level: Selective Orientation Level: Oriented to person, Oriented to place, Oriented to situation Following Commands: Follows one step commands consistently Safety/Judgement: Decreased awareness of safety, Decreased awareness of deficits General Comments: Pt demonstrated decreased STM as seen by difficulty recalling precautions. Pt and wife had several questions regarding his care and ability to perform ADLs. Demonstrated slow processing and decreased problem solving. Required simple cues throughout session to follow one step commands    Extremity Assessment (includes Sensation/Coordination)  Upper Extremity Assessment: Overall WFL for tasks assessed  Lower Extremity Assessment: Defer to PT evaluation    ADLs  Overall ADL's : Needs assistance/impaired Eating/Feeding: Independent, Sitting Grooming: Wash/dry hands, Min guard, Standing Grooming Details (indicate cue type and reason): min guard A for safety Upper Body Bathing: Min guard, Sitting Lower Body Bathing: Minimal assistance, Sit to/from stand, Maximal assistance Lower Body Bathing Details (indicate cue type and reason): provided education for compensatory techniques for LB ADLs. Required min A and VCs for using strategies for peri-care. Pt requires mod A for rest of LB bathing Upper Body Dressing : Cueing for sequencing, Sitting, Minimal assistance Upper Body Dressing Details (indicate cue type and reason): Provided education regarding brace management. Required min A and VCs for donning brace correctly Lower Body Dressing: Min guard, With adaptive equipment Lower Body Dressing Details (indicate cue type and reason): Provided education regarding compensatory techniques and  AE for LB ADLs. Pt required VCs and supevision to maintain precautions Toilet Transfer: Minimal assistance, Ambulation, RW Toilet Transfer Details (indicate cue type and reason): Pt required min A to power up to stand. provided education on hand placement to power up from toilet Toileting- Clothing Manipulation and Hygiene: Minimal assistance, Sit to/from stand Toileting - Clothing Manipulation Details (indicate cue type and reason): Min A for balance and peri-care after BM Functional mobility during ADLs: Rolling walker, Min guard General ADL Comments: provided education regarding compensatory techniques and AE for LB ADLs. Min guard-min A throughout    Mobility  Overal bed mobility: Needs Assistance Bed Mobility: Rolling, Sidelying to Sit  Rolling: Min assist(no rail) Sidelying to sit: Min assist(no rail) Sit to sidelying: Mod assist General bed mobility comments: pt in bathroom with PT upon arrival    Transfers  Overall transfer level: Needs assistance Equipment used: Rolling walker (2 wheeled) Transfers: Sit to/from Stand Sit to Stand: Min assist General transfer comment: Pt required min A to power up to stand, with VCs for hand placement    Ambulation / Gait / Stairs / Wheelchair Mobility  Ambulation/Gait Ambulation/Gait assistance: Counsellor (Feet): 120 Feet Assistive device: Rolling walker (2 wheeled) Gait Pattern/deviations: Step-through pattern, Decreased stride length, Trunk flexed General Gait Details: Patient initially using step-to pattern and required max cues and assist to progress to pushing RW while walking for smoother gait; cues for posture and position in rW. Pt with less upper body reliance on RW and able to achieve full upright posture (needed cues to maintain).  Gait velocity: Decreased Gait velocity interpretation: <1.8 ft/sec, indicate of risk for recurrent falls    Posture / Balance Dynamic Sitting Balance Sitting balance - Comments: EOB and  toilet without UE assist Balance Overall balance assessment: Needs assistance Sitting-balance support: No upper extremity supported, Feet supported Sitting balance-Leahy Scale: Fair Sitting balance - Comments: EOB and toilet without UE assist Standing balance support: During functional activity, No upper extremity supported Standing balance-Leahy Scale: Fair Standing balance comment: able to maintain static standing at sink for short periods of time    Special needs/care consideration BiPAP/CPAP : no CPM : no Continuous Drip IV : no Dialysis : no        Days : no Life Vest : no Oxygen : no, on RA Special Bed : no Trach Size : no Wound Vac (area) : no      Location : no Skin : surgical incision to back and abdomen     Bowel mgmt: last BM: 10/11/2019  Bladder mgmt: incontinent Diabetic mgmt: yes Behavioral consideration : no Chemo/radiation : no   Previous Home Environment (from acute therapy documentation) Living Arrangements: Spouse/significant other Available Help at Discharge: Family Type of Home: House Home Layout: One level Home Access: Stairs to enter Entrance Stairs-Rails: Right Entrance Stairs-Number of Steps: 3 Bathroom Shower/Tub: Multimedia programmer: Handicapped height Home Care Services: No  Discharge Living Setting Plans for Discharge Living Setting: Patient's home, Lives with (comment)(spouse) Type of Home at Discharge: House Discharge Home Layout: One level Discharge Home Access: Stairs to enter Entrance Stairs-Rails: None Entrance Stairs-Number of Steps: 2 Discharge Bathroom Shower/Tub: Tub/shower unit(and walk in) Discharge Bathroom Toilet: Standard Discharge Bathroom Accessibility: Yes How Accessible: Accessible via walker Does the patient have any problems obtaining your medications?: No  Social/Family/Support Systems Patient Roles: Spouse, Other (Comment)(full time employee) Contact Information: Spouse: Hilda Blades  [phone]) Anticipated Caregiver: Hilda Blades Anticipated Caregiver's Contact Information: see above Ability/Limitations of Caregiver: Supervision Caregiver Availability: 24/7 Discharge Plan Discussed with Primary Caregiver: Yes Is Caregiver In Agreement with Plan?: Yes Does Caregiver/Family have Issues with Lodging/Transportation while Pt is in Rehab?: No  Goals/Additional Needs Patient/Family Goal for Rehab: PT/OT: Mod I; SLP: NA Expected length of stay: 5-7 days Cultural Considerations: NA Dietary Needs: carb modified; thin liquids; calorie level medium 1600-2000 Equipment Needs: TBD Pt/Family Agrees to Admission and willing to participate: Yes Program Orientation Provided & Reviewed with Pt/Caregiver Including Roles  & Responsibilities: Yes(wife)  Barriers to Discharge: Home environment access/layout  Barriers to Discharge Comments: steps to enter  Decrease burden of Care through IP rehab admission: NA  Possible need  for SNF placement upon discharge: Not anticipated. Pt has good social support from his spouse who can provide supervision at DC. Anticipate pt will have a good prognosis for further progress through CIR.   Patient Condition: I have reviewed medical records from [facility], spoken with CM, PA, and patient and spouse. I met with patient at the bedside for inpatient rehabilitation assessment.  Patient will benefit from ongoing PT and OT, can actively participate in 3 hours of therapy a day 5 days of the week, and can make measurable gains during the admission.  Patient will also benefit from the coordinated team approach during an Inpatient Acute Rehabilitation admission.  The patient will receive intensive therapy as well as Rehabilitation physician, nursing, social worker, and care management interventions.  Due to safety, skin/wound care, disease management, medication administration, pain management and patient education the patient requires 24 hour a day  rehabilitation nursing.  The patient is currently Min G with mobility and Min G/MIn A for basic ADLs.  Discharge setting and therapy post discharge at home with home health is anticipated.  Patient has agreed to participate in the Acute Inpatient Rehabilitation Program and will admit today.  Preadmission Screen Completed By:  Raechel Ache, 10/12/2019 11:48 AM ______________________________________________________________________   Discussed status with Dr. Dagoberto Ligas on 10/12/2019 at 11:47AM and received approval for admission today.  Admission Coordinator:  Raechel Ache, OT, time 11:47AM/Date 10/12/2019   Assessment/Plan: Diagnosis: 1. Does the need for close, 24 hr/day Medical supervision in concert with the patient's rehab needs make it unreasonable for this patient to be served in a less intensive setting? Yes 2. Co-Morbidities requiring supervision/potential complications: Confusion, post op pain, HTN, DM  3. Due to safety, skin/wound care, disease management, medication administration, pain management and patient education, does the patient require 24 hr/day rehab nursing? Yes 4. Does the patient require coordinated care of a physician, rehab nurse, PT, OT, and SLP to address physical and functional deficits in the context of the above medical diagnosis(es)? Yes Addressing deficits in the following areas: balance, endurance, locomotion, strength, transferring, bathing, dressing, grooming, toileting and cognition 5. Can the patient actively participate in an intensive therapy program of at least 3 hrs of therapy 5 days a week? Yes 6. The potential for patient to make measurable gains while on inpatient rehab is good 7. Anticipated functional outcomes upon discharge from inpatient rehab: modified independent PT, modified independent OT, n/a SLP 8. Estimated rehab length of stay to reach the above functional goals is: 5-7 days 9. Anticipated discharge destination: Home 10. Overall Rehab/Functional  Prognosis: good   MD Signature:

## 2019-10-13 ENCOUNTER — Inpatient Hospital Stay (HOSPITAL_COMMUNITY): Payer: BC Managed Care – PPO | Admitting: Physical Therapy

## 2019-10-13 ENCOUNTER — Inpatient Hospital Stay (HOSPITAL_COMMUNITY): Payer: BC Managed Care – PPO | Admitting: Occupational Therapy

## 2019-10-13 DIAGNOSIS — M5416 Radiculopathy, lumbar region: Secondary | ICD-10-CM

## 2019-10-13 LAB — CBC WITH DIFFERENTIAL/PLATELET
Abs Immature Granulocytes: 0.09 10*3/uL — ABNORMAL HIGH (ref 0.00–0.07)
Basophils Absolute: 0.1 10*3/uL (ref 0.0–0.1)
Basophils Relative: 1 %
Eosinophils Absolute: 0.3 10*3/uL (ref 0.0–0.5)
Eosinophils Relative: 3 %
HCT: 39.5 % (ref 39.0–52.0)
Hemoglobin: 13 g/dL (ref 13.0–17.0)
Immature Granulocytes: 1 %
Lymphocytes Relative: 19 %
Lymphs Abs: 1.7 10*3/uL (ref 0.7–4.0)
MCH: 30.5 pg (ref 26.0–34.0)
MCHC: 32.9 g/dL (ref 30.0–36.0)
MCV: 92.7 fL (ref 80.0–100.0)
Monocytes Absolute: 1.4 10*3/uL — ABNORMAL HIGH (ref 0.1–1.0)
Monocytes Relative: 16 %
Neutro Abs: 5.3 10*3/uL (ref 1.7–7.7)
Neutrophils Relative %: 60 %
Platelets: 230 10*3/uL (ref 150–400)
RBC: 4.26 MIL/uL (ref 4.22–5.81)
RDW: 13.5 % (ref 11.5–15.5)
WBC: 8.9 10*3/uL (ref 4.0–10.5)
nRBC: 0 % (ref 0.0–0.2)

## 2019-10-13 LAB — COMPREHENSIVE METABOLIC PANEL
ALT: 77 U/L — ABNORMAL HIGH (ref 0–44)
AST: 41 U/L (ref 15–41)
Albumin: 2.8 g/dL — ABNORMAL LOW (ref 3.5–5.0)
Alkaline Phosphatase: 71 U/L (ref 38–126)
Anion gap: 11 (ref 5–15)
BUN: 15 mg/dL (ref 8–23)
CO2: 29 mmol/L (ref 22–32)
Calcium: 9.3 mg/dL (ref 8.9–10.3)
Chloride: 98 mmol/L (ref 98–111)
Creatinine, Ser: 0.88 mg/dL (ref 0.61–1.24)
GFR calc Af Amer: >60 mL/min (ref >60)
GFR calc non Af Amer: >60 mL/min (ref >60)
Glucose, Bld: 158 mg/dL — ABNORMAL HIGH (ref 70–99)
Potassium: 3.9 mmol/L (ref 3.5–5.1)
Sodium: 138 mmol/L (ref 135–145)
Total Bilirubin: 1.2 mg/dL (ref 0.3–1.2)
Total Protein: 6.2 g/dL — ABNORMAL LOW (ref 6.5–8.1)

## 2019-10-13 LAB — GLUCOSE, CAPILLARY
Glucose-Capillary: 160 mg/dL — ABNORMAL HIGH (ref 70–99)
Glucose-Capillary: 145 mg/dL — ABNORMAL HIGH (ref 70–99)
Glucose-Capillary: 120 mg/dL — ABNORMAL HIGH (ref 70–99)
Glucose-Capillary: 139 mg/dL — ABNORMAL HIGH (ref 70–99)

## 2019-10-13 MED ORDER — ENOXAPARIN SODIUM 40 MG/0.4ML ~~LOC~~ SOLN
40.0000 mg | SUBCUTANEOUS | Status: DC
  Administered 2019-10-13 – 2019-10-17 (×5): 40 mg via SUBCUTANEOUS
  Filled 2019-10-13 (×5): qty 0.4

## 2019-10-13 MED ORDER — ASPIRIN EC 81 MG PO TBEC
81.0000 mg | DELAYED_RELEASE_TABLET | Freq: Every day | ORAL | Status: DC
  Administered 2019-10-13 – 2019-10-18 (×6): 81 mg via ORAL
  Filled 2019-10-13 (×6): qty 1

## 2019-10-13 NOTE — Progress Notes (Signed)
Physical Therapy Session Note  Patient Details  Name: Byren Pankow MRN: [id number] Date of Birth: 1955/12/27  Today's Date: 10/13/2019 PT Individual Time: 8979-1504 PT Individual Time Calculation (min): 25 min   Short Term Goals: Week 1:  PT Short Term Goal 1 (Week 1): =LTG due to ELOS  Skilled Therapeutic Interventions/Progress Updates:   Pt received sitting in recliner and agreeable to PT. Stand pivot transfer to [facility] with min assist and RW. WC mobility with supervision assist min cues safety and turning technique to prevent hitting obstacles on the L. Gait training 2 x 131f with CGA from PT and BUE support on RW. Pt noted to have narrowing BOS with fatigue and reports mild pain in the R buttock with fatigue as well. Patient returned to room and left sitting in recliner with call bell in reach and all needs met.      Therapy Documentation Precautions:  Precautions Precautions: Back, Fall Precaution Comments: Pt recalls 3/3 precautions Required Braces or Orthoses: Spinal Brace Spinal Brace: Lumbar corset, Applied in sitting position Restrictions Weight Bearing Restrictions: No Vital Signs: Therapy Vitals Temp: 98.3 F (36.8 C) Temp Source: Oral Pulse Rate: 72 Resp: 18 BP: 127/69 Oxygen Therapy SpO2: 98 % O2 Device: Room Air Pain:   denies at rest   Therapy/Group: Individual Therapy  ALorie Phenix10/28/2020, 5:47 PM

## 2019-10-13 NOTE — Progress Notes (Signed)
[facility] PHYSICAL MEDICINE & REHABILITATION PROGRESS NOTE   Subjective/Complaints:  Pt reports a new onset pain- he describes it as thigh pain B.L anterior thighs= sharp, stabbing and throbbing; radiates to past his knees.  Pain much better with Norco prn. Not having much this AM since has Norco.  Also was dx'd with B/L gastroc DVTs overnight- no pain with gastroc DVTs  Objective:   Vas [place] Lower Extremity Venous (dvt)  Result Date: 10/13/2019  Lower Venous Study Indications: Status post lumbar fusion.  Comparison Study: No prior study on file for comparison Performing Technologist: Sharion Dove RVS  Examination Guidelines: A complete evaluation includes B-mode imaging, spectral Doppler, color Doppler, and power Doppler as needed of all accessible portions of each vessel. Bilateral testing is considered an integral part of a complete examination. Limited examinations for reoccurring indications may be performed as noted.  +---------+---------------+---------+-----------+----------+--------------+ RIGHT    CompressibilityPhasicitySpontaneityPropertiesThrombus Aging +---------+---------------+---------+-----------+----------+--------------+ CFV      Full           Yes      Yes                                 +---------+---------------+---------+-----------+----------+--------------+ SFJ      Full                                                        +---------+---------------+---------+-----------+----------+--------------+ FV Prox  Full                                                        +---------+---------------+---------+-----------+----------+--------------+ FV Mid   Full                                                        +---------+---------------+---------+-----------+----------+--------------+ FV DistalFull                                                        +---------+---------------+---------+-----------+----------+--------------+ PFV       Full                                                        +---------+---------------+---------+-----------+----------+--------------+ POP      Full           Yes      Yes                                 +---------+---------------+---------+-----------+----------+--------------+ PTV      Full                                                        +---------+---------------+---------+-----------+----------+--------------+  PERO     Full                                                        +---------+---------------+---------+-----------+----------+--------------+ Gastroc  None                                         Acute          +---------+---------------+---------+-----------+----------+--------------+   +---------+---------------+---------+-----------+----------+--------------+ LEFT     CompressibilityPhasicitySpontaneityPropertiesThrombus Aging +---------+---------------+---------+-----------+----------+--------------+ CFV      Full           Yes      Yes                                 +---------+---------------+---------+-----------+----------+--------------+ SFJ      Full                                                        +---------+---------------+---------+-----------+----------+--------------+ FV Prox  Full                                                        +---------+---------------+---------+-----------+----------+--------------+ FV Mid   Full                                                        +---------+---------------+---------+-----------+----------+--------------+ FV DistalFull                                                        +---------+---------------+---------+-----------+----------+--------------+ PFV      Full                                                        +---------+---------------+---------+-----------+----------+--------------+ POP      Full           Yes      Yes                                  +---------+---------------+---------+-----------+----------+--------------+ PTV      Full                                                        +---------+---------------+---------+-----------+----------+--------------+  PERO     Full                                                        +---------+---------------+---------+-----------+----------+--------------+ Gastroc  None                                         Acute          +---------+---------------+---------+-----------+----------+--------------+     Summary: Right: Findings consistent with acute deep vein thrombosis involving the right gastrocnemius veins. Left: Findings consistent with acute deep vein thrombosis involving the left gastrocnemius veins.  *See table(s) above for measurements and observations. Electronically signed by Deitra Mayo MD on 10/13/2019 at 6:23:58 AM.    Final    Recent Labs    10/13/19 0501  WBC 8.9  HGB 13.0  HCT 39.5  PLT 230   Recent Labs    10/13/19 0501  NA 138  K 3.9  CL 98  CO2 29  GLUCOSE 158*  BUN 15  CREATININE 0.88  CALCIUM 9.3    Intake/Output Summary (Last 24 hours) at 10/13/2019 1004 Last data filed at 10/13/2019 0853 Gross per 24 hour  Intake 462 ml  Output 850 ml  Net -388 ml     Physical Exam: Vital Signs Blood pressure 127/79, pulse 78, temperature 97.7 F (36.5 C), temperature source Oral, resp. rate 19, height 5\' 9"  (1.753 m), weight 113.5 kg, SpO2 95 %.  Physical Exam Nursing note, labs, and vitalsreviewed. Constitutional: He appearswell-developedand well-nourished.No distress. Sitting in manual w/c with OT; wearing LSO, NAD HENT:  Head:Normocephalicand atraumatic.  Nose:Nose normal.  Mouth/Throat:Oropharynx is clear and moist.  Eyes:conjugate gaze  Neck:Normal range of motion.Neck supple.No thyromegalypresent.  Cardiovascular: RRR Respiratory: CTA B/L- no wheezes, rales, or  rhonchi GI: Hypoactive (just had BM), soft, NT, ND Musculoskeletal:  Comments: HFs 4+/5; otherwise 5/5 in UEs and LEs B/L Neurological: He is alert. Only R hand IVs seen today- no infiltration. Psychiatric: less impulsive today    Assessment/Plan: 1. Functional deficits secondary to L3-L5 fusion which require 3+ hours per day of interdisciplinary therapy in a comprehensive inpatient rehab setting.  Physiatrist is providing close team supervision and 24 hour management of active medical problems listed below.  Physiatrist and rehab team continue to assess barriers to discharge/monitor patient progress toward functional and medical goals  Care Tool:  Bathing              Bathing assist       Upper Body Dressing/Undressing Upper body dressing        Upper body assist      Lower Body Dressing/Undressing Lower body dressing            Lower body assist       Toileting Toileting    Toileting assist Assist for toileting: Independent with assistive device Assistive Device Comment: urinal    Transfers Chair/bed transfer  Transfers assist     Chair/bed transfer assist level: Independent with assistive device Chair/bed transfer assistive device: Programmer, multimedia   Ambulation assist              Walk 10 feet activity   Assist  Walk 50 feet activity   Assist           Walk 150 feet activity   Assist           Walk 10 feet on uneven surface  activity   Assist           Wheelchair     Assist               Wheelchair 50 feet with 2 turns activity    Assist            Wheelchair 150 feet activity     Assist          Blood pressure 127/79, pulse 78, temperature 97.7 F (36.5 C), temperature source Oral, resp. rate 19, height 5\' 9"  (1.753 m), weight 113.5 kg, SpO2 95 %.  Medical Problem List and Plan: 1.Decreased functional mobilitysecondary to  degenerative disc disease with slight scoliosis and radiculopathy L3-4 and 4-5. Status post posterior instrumented fusionwith oblique exposureL3-L5 10/08/2019. Back brace with out of bed.Plan suture removal 2 weeks from day of surgery. 2. Antithrombotics: -DVT/anticoagulation:SCDs. Check vascular study (per Ortho, doesn't feel comfortable with additional anticoagulation) 10/28- Pt has new B/L gastroc DVTs- got OK from surgeon to put on Lovenox 40 mg daily- asked [place] to let them know if was therapeutic dosing; will recheck Vascular study in 1 week. -antiplatelet therapy: N/A.Patient on aspirin 81 mg daily prior to admission. Will discuss resuming with orthopedics Dr. Rolena Infante 3. Pain Management:Neurontin 300 mg 3 times daily,Norco,and Robaxin as needed 4. Mood:Provide emotional support -antipsychotic agents: N/A 5. Neuropsych: This patientiscapable of making decisions on hisown behalf. 6. Skin/Wound Care:Routine skin checks 7. Fluids/Electrolytes/Nutrition:Routine in and outs with follow-up chemistries 8. Diabetes mellitus. Hemoglobin A1c 7.3. SSI. Glucophage 500 mg 1 tablet breakfast and 2 tablets at supper prior to admission. Will con't to monitor and titrate to get better control of BGs. CBG (last 3)  Recent Labs    10/12/19 1655 10/12/19 2107 10/13/19 0604  GLUCAP 108* 206* 160*  10/28- overall borderline control- will monitor for trends 9. Hypertension.Ziac5-6.25 mg daily. Will monitor based on starting to mobilize      LOS: 1 days A FACE TO FACE EVALUATION WAS PERFORMED  Kehaulani Fruin 10/13/2019, 10:04 AM

## 2019-10-13 NOTE — H&P (Signed)
Physical Medicine and Rehabilitation Admission H&P    Chief complaint: Back pain HPI: Aaron Burns is a 63 year old right-handed male history of diabetes mellitus, hypertension, morbid obesity with BMI 36.92, tobacco abuse, right total knee replacement 2016.  Presented 10/07/2019 with progressive low back pain radiating to the lower extremities.  X-rays and imaging revealed degenerative disc disease with slight scoliosis and radiculopathy L3-4 and 4-5.  Underwent posterior spinal fusion interbody decompression with oblique exposure L3-L5 10/08/2019 per Dr. Rolena Infante.  Hospital course pain management.  Back brace when out of bed applied in sitting position.  No postoperative labs were completed.  Therapy evaluations completed and patient was admitted for a comprehensive rehab program.  Patient reports pain with meds 1-2/10- however has been taking oxycodone which wife says makes him very confused and not following directions. Has also been getting up to bathroom on his own. Was changed to Norco in last 24 hours. Urinating well- LBM just now, while we were there; previous BM 1-2 days ago.  Review of Systems  Constitutional: Negative for chills and fever.  HENT: Negative for hearing loss.   Eyes: Negative for blurred vision and double vision.  Respiratory: Negative for cough and shortness of breath.   Cardiovascular: Negative for chest pain, palpitations and leg swelling.  Gastrointestinal: Negative for heartburn, nausea and vomiting.  Genitourinary: Positive for urgency. Negative for dysuria, flank pain and hematuria.  Musculoskeletal: Positive for back pain and myalgias.  Skin: Negative for rash.  All other systems reviewed and are negative.      Past Medical History:  Diagnosis Date  . Diabetes mellitus, type 2 ([place])   . Elevated hemoglobin A1c   . Heart murmur    at birth  . History of kidney stones 2011  . Hypertension   . Low back pain    Radiates to left ankle.   Injured after falling on back.  . Morbid obesity (BMI 36. 06/07/2015  . 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 Renal disorder   . Tuberculosis    2014 dx, was treated with medications for about 4 months        Past Surgical History:  Procedure Laterality Date  . CHOLECYSTECTOMY  07/27/2012   Procedure: LAPAROSCOPIC CHOLECYSTECTOMY WITH INTRAOPERATIVE CHOLANGIOGRAM;  Surgeon: Marcello Moores A. Cornett, MD;  Location: [facility];  Service: General;  Laterality: N/A;  . COLONOSCOPY    . CYSTOSCOPY/RETROGRADE/URETEROSCOPY/STONE EXTRACTION WITH BASKET  5/11  . HERNIA REPAIR     as a baby  . KNEE SURGERY Right 11/25/11 and 2015   arthroscopic, cortisone injections done also  . TOTAL KNEE ARTHROPLASTY Right 06/06/2015   Procedure: RIGHT TOTAL KNEE ARTHROPLASTY;  Surgeon: Paralee Cancel, MD;  Location: [facility];  Service: Orthopedics;  Laterality: Right;  . WISDOM TOOTH EXTRACTION  yrs ago        Family History  Problem Relation Age of Onset  . Hypertension Mother   . Diabetes Mother   . Kidney failure Mother   . Diabetes Father   . Diabetes Sister   . Diabetes Brother    Social History:  reports that he quit smoking about 2 weeks ago. His smoking use included cigarettes. He has a 7.50 pack-year smoking history. He quit smokeless tobacco use about 5 years ago. He reports current alcohol use. He reports that he does not use drugs. Allergies:       Allergies  Allergen Reactions  . Ppd [Tuberculin Purified Protein  Derivative]     Pt states he is not allergic to this.Marland Kitchenpositive tb test 2014, pt took tb treatment 2014         Medications Prior to Admission  Medication Sig Dispense Refill  . aspirin 81 MG chewable tablet Chew 81 mg by mouth daily.    . bisoprolol-hydrochlorothiazide (ZIAC) 5-6.25 MG tablet Take 1 tablet Daily for BP (Patient taking differently: Take 1 tablet by mouth daily. ) 90 tablet 1  . Cholecalciferol  (VITAMIN D3 MAXIMUM STRENGTH) 5000 UNITS capsule Take 10,000 Units by mouth daily.     [name] CINNAMON PO Take 1,000 mg by mouth daily.    . Magnesium 250 MG TABS Take 250 mg by mouth at bedtime.     . metFORMIN (GLUCOPHAGE-XR) 500 MG 24 hr tablet Take 1 tablet with Bkfst & Lunch & 2 tablets with Supper for Diabetes (Patient taking differently: Take 500-1,000 mg by mouth See admin instructions. Take 500 mg by mouth in the morning, 500 mg in the afternoon and 1000 mg in the evening) 360 tablet 0  . Multiple Vitamin (MULTIVITAMIN WITH MINERALS) TABS Take 1 tablet by mouth daily.    . phentermine (ADIPEX-P) 37.5 MG tablet Take 1/2 to 1 tablet every Morning for Dieting & Weight Loss (Patient taking differently: Take 18.75-37.5 mg by mouth daily before breakfast. ) 90 tablet 1  . vitamin C (ASCORBIC ACID) 500 MG tablet Take 1,000 mg by mouth daily.    [name] zinc gluconate 50 MG tablet Take 50 mg by mouth daily.      Drug Regimen Review Drug regimen was reviewed and remains appropriate with no significant issues identified  Home: Home Living Family/patient expects to be discharged to:: Private residence Living Arrangements: Spouse/significant other Available Help at Discharge: Family Type of Home: House Home Access: Stairs to enter Technical brewer of Steps: 3 Entrance Stairs-Rails: Right Home Layout: One level Bathroom Shower/Tub: Multimedia programmer: Handicapped height [place]: Environmental consultant - 2 wheels, Shower seat   Functional History: Prior Function Level of Independence: Independent Comments: Pt worked for [employer].   Functional Status:  Mobility: Bed Mobility Overal bed mobility: Needs Assistance Bed Mobility: Rolling, Sidelying to Sit Rolling: Min assist(no rail) Sidelying to sit: Min assist(no rail) Sit to sidelying: Mod assist General bed mobility comments: pt in bathroom with PT upon arrival Transfers Overall transfer level: Needs  assistance Equipment used: Rolling walker (2 wheeled) Transfers: Sit to/from Stand Sit to Stand: Min assist General transfer comment: Pt required min A to power up to stand, with VCs for hand placement Ambulation/Gait Ambulation/Gait assistance: Min guard Gait Distance (Feet): 120 Feet Assistive device: Rolling walker (2 wheeled) Gait Pattern/deviations: Step-through pattern, Decreased stride length, Trunk flexed General Gait Details: Patient initially using step-to pattern and required max cues and assist to progress to pushing RW while walking for smoother gait; cues for posture and position in rW. Pt with less upper body reliance on RW and able to achieve full upright posture (needed cues to maintain).  Gait velocity: Decreased Gait velocity interpretation: <1.8 ft/sec, indicate of risk for recurrent falls  ADL: ADL Overall ADL's : Needs assistance/impaired Eating/Feeding: Independent, Sitting Grooming: Wash/dry hands, Min guard, Standing Grooming Details (indicate cue type and reason): min guard A for safety Upper Body Bathing: Min guard, Sitting Lower Body Bathing: Minimal assistance, Sit to/from stand, Maximal assistance Lower Body Bathing Details (indicate cue type and reason): provided education for compensatory techniques for LB ADLs. Required min A and VCs for  using strategies for peri-care. Pt requires mod A for rest of LB bathing Upper Body Dressing : Cueing for sequencing, Sitting, Minimal assistance Upper Body Dressing Details (indicate cue type and reason): Provided education regarding brace management. Required min A and VCs for donning brace correctly Lower Body Dressing: Min guard, With adaptive equipment Lower Body Dressing Details (indicate cue type and reason): Provided education regarding compensatory techniques and AE for LB ADLs. Pt required VCs and supevision to maintain precautions Toilet Transfer: Minimal assistance, Ambulation, RW Toilet Transfer Details  (indicate cue type and reason): Pt required min A to power up to stand. provided education on hand placement to power up from toilet Toileting- Clothing Manipulation and Hygiene: Minimal assistance, Sit to/from stand Toileting - Clothing Manipulation Details (indicate cue type and reason): Min A for balance and peri-care after BM Functional mobility during ADLs: Rolling walker, Min guard General ADL Comments: provided education regarding compensatory techniques and AE for LB ADLs. Min guard-min A throughout  Cognition: Cognition Overall Cognitive Status: Impaired/Different from baseline Orientation Level: Oriented X4 Cognition Arousal/Alertness: Awake/alert Behavior During Therapy: WFL for tasks assessed/performed Overall Cognitive Status: Impaired/Different from baseline Area of Impairment: Memory, Safety/judgement, Awareness, Problem solving Current Attention Level: Selective Memory: Decreased short-term memory Following Commands: Follows one step commands consistently Safety/Judgement: Decreased awareness of safety, Decreased awareness of deficits Awareness: Emergent Problem Solving: Slow processing General Comments: Pt demonstrated decreased STM as seen by difficulty recalling precautions. Pt and wife had several questions regarding his care and ability to perform ADLs. Demonstrated slow processing and decreased problem solving. Required simple cues throughout session to follow one step commands  Physical Exam: Blood pressure 124/74, pulse 85, temperature 97.7 F (36.5 C), temperature source Oral, resp. rate 20, height 5\' 9"  (1.753 m), weight 113.4 kg, SpO2 94 %. Physical Exam  Nursing note and vitals reviewed. Constitutional: He appears well-developed and well-nourished. No distress.  Got up to go to bathroom ON HIS own, didn't even try to call for nurse; encouraged him to call for nurse; wife had to wipe his bottom after BM, originally sitting in chair at bedside, wearing LSO,  NAD  HENT:  Head: Normocephalic and atraumatic.  Nose: Nose normal.  Mouth/Throat: Oropharynx is clear and moist.  Eyes: Pupils are equal, round, and reactive to light. Conjunctivae and EOM are normal.  Neck: Normal range of motion. Neck supple. No thyromegaly present.  Cardiovascular:  RRR  Respiratory:  CTA B/L- no wheezes, rales, or rhonchi  GI:  Hypoactive (just had BM), soft, NT, ND  Musculoskeletal:     Comments: HFs 4+/5; otherwise 5/5 in UEs and LEs B/L  Neurological: He is alert.  Patient is alert sitting up in chair.  No complaints.  Oriented x3.  Follows full commands- impulsive- getting up to toilet on own  Skin: Skin is warm and dry.  Back incision is dressed.  Clean and dry- with honey comb dressing- in place on low back and L side; a tiny bit of dried blood under back dressing seen. 2/B/L hand IVs seen- no infiltration.  Psychiatric: He has a normal mood and affect. His behavior is normal.    Lab Results Last 48 Hours       Results for orders placed or performed during the hospital encounter of 10/07/19 (from the past 48 hour(s))  Glucose, capillary     Status: Abnormal   Collection Time: 10/10/19 12:41 PM  Result Value Ref Range   Glucose-Capillary 190 (H) 70 - 99 mg/dL  Glucose, capillary  Status: Abnormal   Collection Time: 10/10/19  6:33 PM  Result Value Ref Range   Glucose-Capillary 185 (H) 70 - 99 mg/dL   Comment 1 Notify RN    Comment 2 Document in Chart   Glucose, capillary     Status: Abnormal   Collection Time: 10/10/19  8:49 PM  Result Value Ref Range   Glucose-Capillary 230 (H) 70 - 99 mg/dL  Glucose, capillary     Status: Abnormal   Collection Time: 10/11/19  8:26 AM  Result Value Ref Range   Glucose-Capillary 211 (H) 70 - 99 mg/dL   Comment 1 Notify RN    Comment 2 Document in Chart   Glucose, capillary     Status: Abnormal   Collection Time: 10/11/19  1:31 PM  Result Value Ref Range   Glucose-Capillary 152 (H) 70 -  99 mg/dL   Comment 1 Notify RN    Comment 2 Document in Chart   Glucose, capillary     Status: Abnormal   Collection Time: 10/11/19  4:36 PM  Result Value Ref Range   Glucose-Capillary 161 (H) 70 - 99 mg/dL  Glucose, capillary     Status: Abnormal   Collection Time: 10/11/19  9:12 PM  Result Value Ref Range   Glucose-Capillary 188 (H) 70 - 99 mg/dL  Glucose, capillary     Status: Abnormal   Collection Time: 10/12/19  8:01 AM  Result Value Ref Range   Glucose-Capillary 205 (H) 70 - 99 mg/dL   Comment 1 Notify RN    Comment 2 Document in Chart      Imaging Results (Last 48 hours)  No results found.       Medical Problem List and Plan: 1.  Decreased functional mobility secondary to degenerative disc disease with slight scoliosis and radiculopathy L3-4 and 4-5.  Status post posterior instrumented fusion with oblique exposure L3-L5 10/08/2019.  Back brace with out of bed.  Plan suture removal 2 weeks from day of surgery. 2.  Antithrombotics: -DVT/anticoagulation: SCDs.  Check vascular study (per Ortho, doesn't feel comfortable with additional anticoagulation)             -antiplatelet therapy: N/A.  Patient on aspirin 81 mg daily prior to admission.  Will discuss  resuming with orthopedics Dr. Rolena Infante 3. Pain Management: Neurontin 300 mg 3 times daily, Norco, and Robaxin as needed 4. Mood: Provide emotional support             -antipsychotic agents: N/A 5. Neuropsych: This patient is capable of making decisions on his own behalf. 6. Skin/Wound Care: Routine skin checks 7. Fluids/Electrolytes/Nutrition: Routine in and outs with follow-up chemistries 8.  Diabetes mellitus.  Hemoglobin A1c 7.3.  SSI.   Glucophage 500 mg 1 tablet breakfast and 2 tablets at supper prior to admission. Will con't to monitor and titrate to get better control of BGs.   9.  Hypertension.Ziac 5-6.25 mg daily. Will monitor based on starting to mobilize    Cathlyn Parsons, PA-C  10/12/2019

## 2019-10-13 NOTE — Evaluation (Signed)
Physical Therapy Assessment and Plan  Patient Details  Name: Aaron Burns MRN: [id number] Date of Birth: June 11, 1956  PT Diagnosis: Abnormal posture, Abnormality of gait, Difficulty walking, Low back pain and Muscle weakness Rehab Potential: Good ELOS: 5-7 days   Today's Date: 10/13/2019 PT Individual Time: 1300-1400 PT Individual Time Calculation (min): 60 min    Problem List:  Patient Active Problem List   Diagnosis Date Noted  . Lumbar radiculopathy 10/12/2019  . 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 FH: hypertension 11/26/2018  . Smoker 11/26/2018  . Morbid obesity (BMI 36.31)  06/07/2015  . Medication management 06/10/2014  . Vitamin D deficiency 11/30/2013  . Mixed hyperlipidemia 11/30/2013  . Hypertension   . Type 2 diabetes mellitus (HCC)     Past Medical History:  Past Medical History:  Diagnosis Date  . Diabetes mellitus, type 2 ([place])   . Elevated hemoglobin A1c   . Heart murmur    at birth  . History of kidney stones 2011  . Hypertension   . Low back pain    Radiates to left ankle.  Injured after falling on back.  . Morbid obesity (BMI 36. 06/07/2015  . 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 Renal disorder   . Tuberculosis    2014 dx, was treated with medications for about 4 months   Past Surgical History:  Past Surgical History:  Procedure Laterality Date  . ABDOMINAL EXPOSURE N/A 10/07/2019   Procedure: ABDOMINAL EXPOSURE;  Surgeon: Rosetta Posner, MD;  Location: [facility] OR;  Service: Vascular;  Laterality: N/A;  . ANTERIOR LUMBAR FUSION N/A 10/07/2019   Procedure: Oblique lateral interbody fusion LUMBAR FOUR-FIVE, anterior lateral interbody fusion LUMBAR THREE-FOUR;  Surgeon: Melina Schools, MD;  Location: [place];  Service: Orthopedics;  Laterality: N/A;  TAB BLOCK WITH EXPAREL  . CHOLECYSTECTOMY  07/27/2012   Procedure: LAPAROSCOPIC CHOLECYSTECTOMY WITH INTRAOPERATIVE CHOLANGIOGRAM;  Surgeon: Marcello Moores A.  Cornett, MD;  Location: [place];  Service: General;  Laterality: N/A;  . COLONOSCOPY    . CYSTOSCOPY/RETROGRADE/URETEROSCOPY/STONE EXTRACTION WITH BASKET  5/11  . HERNIA REPAIR     as a baby  . KNEE SURGERY Right 11/25/11 and 2015   arthroscopic, cortisone injections done also  . TOTAL KNEE ARTHROPLASTY Right 06/06/2015   Procedure: RIGHT TOTAL KNEE ARTHROPLASTY;  Surgeon: Paralee Cancel, MD;  Location: [facility];  Service: Orthopedics;  Laterality: Right;  . WISDOM TOOTH EXTRACTION  yrs ago    Assessment & Plan Clinical Impression:  Aaron Burns is a 63 year old right-handed male history of diabetes mellitus, hypertension, morbid obesity with BMI 36.92, tobacco abuse, right total knee replacement 2016.  Presented 10/07/2019 with progressive low back pain radiating to the lower extremities.  X-rays and imaging revealed degenerative disc disease with slight scoliosis and radiculopathy L3-4 and 4-5.  Underwent posterior spinal fusion interbody decompression with oblique exposure L3-L5 10/08/2019 per Dr. Rolena Infante.  Hospital course pain management.  Back brace when out of bed applied in sitting position.  No postoperative labs were completed.  Therapy evaluations completed and patient was admitted for a comprehensive rehab program. Patient transferred to CIR on 10/12/2019 .   Patient currently requires min with mobility secondary to muscle weakness and decreased standing balance, decreased postural control and decreased balance strategies.  Prior to hospitalization, patient was independent  with mobility and lived with Spouse in a House home.  Home access is 3Stairs to enter.  Patient will benefit from skilled PT intervention  to maximize safe functional mobility, minimize fall risk and decrease caregiver burden for planned discharge home with 24 hour assist.  Anticipate patient will benefit from follow up [facility] at discharge.  PT - End of Session Endurance Deficit: Yes Endurance Deficit Description: Rest breaks  required throughout ADL tasks. PT Assessment Rehab Potential (ACUTE/IP ONLY): Good PT Barriers to Discharge: Medical stability PT Patient demonstrates impairments in the following area(s): Endurance;Motor;Pain;Safety PT Transfers Functional Problem(s): Bed Mobility;Bed to Chair;Car;Furniture;Floor PT Locomotion Functional Problem(s): Ambulation;Wheelchair Mobility;Stairs PT Plan PT Intensity: Minimum of 1-2 x/day ,45 to 90 minutes PT Frequency: 5 out of 7 days PT Duration Estimated Length of Stay: 5-7 days PT Treatment/Interventions: Ambulation/gait training;Balance/vestibular training;Community reintegration;Discharge planning;DME/adaptive equipment instruction;Functional mobility training;Neuromuscular re-education;Pain management;Patient/family education;Psychosocial support;Stair training;Therapeutic Activities;Therapeutic Exercise;UE/LE Strength taining/ROM;UE/LE Coordination activities PT Transfers Anticipated Outcome(s): mod I PT Locomotion Anticipated Outcome(s): mod I with LRAD PT Recommendation Follow Up Recommendations: Home health PT Patient destination: Home Equipment Recommended: None recommended by PT Equipment Details: pt already owns RW  Skilled Therapeutic Intervention Evaluation completed (see details above and below) with education on PT POC and goals and individual treatment initiated with focus on functional transfer and gait assessment. Pt received seated in recliner in room, agreeable to PT session. Pt reports 2/10 pain in low back at rest, onset of pain in RLE with gait training. Pt declines any intervention. Pt transfers with min A sit to stand with RW throughout session. Car transfer with RW mod A for BLE management. Ambulation 2 x 150 ft with RW and min A, flexed trunk posture. Adjusted height of RW for improved fit and posture with gait. Ascend/descend 3 x 8" steps with B handrails and min A, 4 x 6" steps with B handrails and min A. Pt left seated in recliner in room  with needs in reach at end of session.   PT Evaluation Precautions/Restrictions Precautions Precautions: Back;Fall Precaution Comments: Pt recalls 3/3 precautions Required Braces or Orthoses: Spinal Brace Spinal Brace: Lumbar corset;Applied in sitting position Restrictions Weight Bearing Restrictions: No Home Living/Prior Functioning Home Living Available Help at Discharge: Family Type of Home: House Home Access: Stairs to enter Technical brewer of Steps: 3 Entrance Stairs-Rails: Right Home Layout: One level Bathroom Shower/Tub: Multimedia programmer: Handicapped height  Lives With: Spouse Prior Function Level of Independence: Independent with gait;Independent with transfers;Independent with basic ADLs;Independent with homemaking with ambulation  Able to Take Stairs?: Yes Driving: Yes 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 Perception: Within Functional Limits Praxis Praxis: Intact  Cognition Overall Cognitive Status: Within Functional Limits for tasks assessed Arousal/Alertness: Awake/alert Orientation Level: Oriented X4 Attention: Focused Focused Attention: Appears intact Memory: Appears intact Immediate Memory Recall: Sock;Blue;Bed Memory Recall Sock: Without Cue Memory Recall Blue: Without Cue Memory Recall Bed: Without Cue Awareness: Appears intact Problem Solving: Appears intact Safety/Judgment: Appears intact Sensation Sensation Light Touch: Appears Intact Proprioception: Appears Intact Coordination Gross Motor Movements are Fluid and Coordinated: No Fine Motor Movements are Fluid and Coordinated: Yes Coordination and Movement Description: Impaired 2/2 generalized weakness and pain Motor  Motor Motor: Other (comment) Motor - Skilled Clinical Observations: Generalized weakness/deconditioning  Mobility Bed Mobility Bed Mobility: Rolling Right;Rolling Left;Supine to Sit;Sit to  Supine Rolling Right: Minimal Assistance - Patient > 75% Rolling Left: Minimal Assistance - Patient > 75% Supine to Sit: Minimal Assistance - Patient > 75% Sit to Supine: Minimal Assistance - Patient > 75% Transfers Transfers: Sit to Stand;Stand Pivot Transfers Sit to Stand: Minimal Assistance - Patient >  75% Stand Pivot Transfers: Minimal Assistance - Patient > 75% Stand Pivot Transfer Details: Verbal cues for sequencing;Verbal cues for technique;Verbal cues for precautions/safety;Verbal cues for safe use of DME/AE Transfer (Assistive device): Rolling walker Locomotion  Gait Gait Distance (Feet): 150 Feet Assistive device: Rolling walker Gait Gait Pattern: Impaired(flexed trunk, dec B step length) Gait velocity: decreased Stairs / Additional Locomotion Stairs: Yes Stairs Assistance: Minimal Assistance - Patient > 75% Stair Management Technique: Two rails;Step to pattern Number of Stairs: 8 Height of Stairs: 3 Wheelchair Mobility Wheelchair Mobility: No  Trunk/Postural Assessment  Cervical Assessment Cervical Assessment: Within Functional Limits Thoracic Assessment Thoracic Assessment: Exceptions to WFL(back precautions) Lumbar Assessment Lumbar Assessment: Exceptions to WFL(back precautions) Postural Control Postural Control: Deficits on evaluation  Balance Balance Balance Assessed: Yes Static Sitting Balance Static Sitting - Balance Support: No upper extremity supported;Feet supported Static Sitting - Level of Assistance: 5: Stand by assistance Static Sitting - Comment/# of Minutes: Sitting EOB Dynamic Sitting Balance Dynamic Sitting - Balance Support: Feet supported;During functional activity Dynamic Sitting - Level of Assistance: 5: Stand by assistance;4: Min assist Static Standing Balance Static Standing - Balance Support: Bilateral upper extremity supported;During functional activity Static Standing - Level of Assistance: 4: Min assist Dynamic Standing  Balance Dynamic Standing - Balance Support: Bilateral upper extremity supported;During functional activity Dynamic Standing - Level of Assistance: 4: Min assist Dynamic Standing - Comments: Standing to complete LB clothing management/toileting task Extremity Assessment  RUE Assessment RUE Assessment: Within Functional Limits LUE Assessment LUE Assessment: Within Functional Limits RLE Assessment RLE Assessment: Exceptions to [facility] Passive Range of Motion (PROM) Comments: tight HS General Strength Comments: impaired, see below RLE Strength Right Hip Flexion: 2+/5 Right Knee Flexion: 3+/5 Right Knee Extension: 4/5 Right Ankle Dorsiflexion: 4/5 LLE Assessment LLE Assessment: Exceptions to [facility] Passive Range of Motion (PROM) Comments: tight HS General Strength Comments: impaired, see below LLE Strength Left Hip Flexion: 3+/5 Left Knee Flexion: 3+/5 Left Knee Extension: 4/5 Left Ankle Dorsiflexion: 4/5    Refer to Care Plan for Long Term Goals  Recommendations for other services: None   Discharge Criteria: Patient will be discharged from PT if patient refuses treatment 3 consecutive times without medical reason, if treatment goals not met, if there is a change in medical status, if patient makes no progress towards goals or if patient is discharged from hospital.  The above assessment, treatment plan, treatment alternatives and goals were discussed and mutually agreed upon: by patient   Excell Seltzer, PT, DPT 10/13/2019, 3:08 PM

## 2019-10-13 NOTE — Evaluation (Addendum)
Occupational Therapy Assessment and Plan  Patient Details  Name: Aaron Burns MRN: [id number] Date of Birth: Aug 23, 1956  OT Diagnosis: acute pain, lumbago (low back pain), muscle weakness (generalized) and pain in thoracic spine Rehab Potential: Rehab Potential (ACUTE ONLY): Excellent ELOS: 5-7 days   Today's Date: 10/13/2019 OT Individual Time: 1017-5102 and 1000-1110 OT Individual Time Calculation (min): 60 min   And 70 min  Problem List:  Patient Active Problem List   Diagnosis Date Noted  . Lumbar radiculopathy 10/12/2019  . 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 FH: hypertension 11/26/2018  . Smoker 11/26/2018  . Morbid obesity (BMI 36.31)  06/07/2015  . Medication management 06/10/2014  . Vitamin D deficiency 11/30/2013  . Mixed hyperlipidemia 11/30/2013  . Hypertension   . Type 2 diabetes mellitus (HCC)     Past Medical History:  Past Medical History:  Diagnosis Date  . Diabetes mellitus, type 2 ([place])   . Elevated hemoglobin A1c   . Heart murmur    at birth  . History of kidney stones 2011  . Hypertension   . Low back pain    Radiates to left ankle.  Injured after falling on back.  . Morbid obesity (BMI 36. 06/07/2015  . 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 Renal disorder   . Tuberculosis    2014 dx, was treated with medications for about 4 months   Past Surgical History:  Past Surgical History:  Procedure Laterality Date  . ABDOMINAL EXPOSURE N/A 10/07/2019   Procedure: ABDOMINAL EXPOSURE;  Surgeon: Rosetta Posner, MD;  Location: [facility] OR;  Service: Vascular;  Laterality: N/A;  . ANTERIOR LUMBAR FUSION N/A 10/07/2019   Procedure: Oblique lateral interbody fusion LUMBAR FOUR-FIVE, anterior lateral interbody fusion LUMBAR THREE-FOUR;  Surgeon: Melina Schools, MD;  Location: [place];  Service: Orthopedics;  Laterality: N/A;  TAB BLOCK WITH EXPAREL  . CHOLECYSTECTOMY  07/27/2012   Procedure: LAPAROSCOPIC  CHOLECYSTECTOMY WITH INTRAOPERATIVE CHOLANGIOGRAM;  Surgeon: Marcello Moores A. Cornett, MD;  Location: [place];  Service: General;  Laterality: N/A;  . COLONOSCOPY    . CYSTOSCOPY/RETROGRADE/URETEROSCOPY/STONE EXTRACTION WITH BASKET  5/11  . HERNIA REPAIR     as a baby  . KNEE SURGERY Right 11/25/11 and 2015   arthroscopic, cortisone injections done also  . TOTAL KNEE ARTHROPLASTY Right 06/06/2015   Procedure: RIGHT TOTAL KNEE ARTHROPLASTY;  Surgeon: Paralee Cancel, MD;  Location: [facility];  Service: Orthopedics;  Laterality: Right;  . WISDOM TOOTH EXTRACTION  yrs ago    Assessment & Plan Clinical Impression: Aaron Burns is a 63 year old right-handed male history of diabetes mellitus, hypertension, morbid obesity with BMI 36.92, tobacco abuse, right total knee replacement 2016.  Presented 10/07/2019 with progressive low back pain radiating to the lower extremities.  X-rays and imaging revealed degenerative disc disease with slight scoliosis and radiculopathy L3-4 and 4-5.  Underwent posterior spinal fusion interbody decompression with oblique exposure L3-L5 10/08/2019 per Dr. Rolena Infante.  Hospital course pain management.  Back brace when out of bed applied in sitting position.  No postoperative labs were completed.  Therapy evaluations completed and patient was admitted for a comprehensive rehab program. Patient transferred to CIR on 10/12/2019 .    Patient currently requires mod with basic self-care skills secondary to muscle weakness, decreased cardiorespiratoy endurance and decreased standing balance, decreased postural control, decreased balance strategies and difficulty maintaining precautions.  Prior to hospitalization, patient could complete ADLs/IADLs with independent .  Patient will benefit  from skilled intervention to decrease level of assist with basic self-care skills, increase independence with basic self-care skills and increase level of independence with iADL prior to discharge home with care partner.   Anticipate patient will require intermittent supervision and follow up outpatient.  OT - End of Session Activity Tolerance: Tolerates 10 - 20 min activity with multiple rests Endurance Deficit: Yes Endurance Deficit Description: Rest breaks required throughout ADL tasks. OT Assessment Rehab Potential (ACUTE ONLY): Excellent OT Patient demonstrates impairments in the following area(s): Balance;Safety;Endurance;Motor;Pain OT Basic ADL's Functional Problem(s): Grooming;Bathing;Dressing;Toileting OT Transfers Functional Problem(s): Toilet;Tub/Shower OT Additional Impairment(s): None OT Plan OT Intensity: Minimum of 1-2 x/day, 45 to 90 minutes OT Frequency: 5 out of 7 days OT Duration/Estimated Length of Stay: 5-7 days OT Treatment/Interventions: Balance/vestibular training;Discharge planning;Pain management;Self Care/advanced ADL retraining;Therapeutic Activities;UE/LE Coordination activities;Therapeutic Exercise;Skin care/wound managment;Patient/family education;Functional mobility training;Community reintegration;DME/adaptive equipment instruction;Neuromuscular re-education;Psychosocial support;UE/LE Strength taining/ROM OT Self Feeding Anticipated Outcome(s): Indep OT Basic Self-Care Anticipated Outcome(s): Supervision-mod I OT Toileting Anticipated Outcome(s): Mod I OT Bathroom Transfers Anticipated Outcome(s): Supervision-mod I OT Recommendation Recommendations for Other Services: Neuropsych consult;Therapeutic Recreation consult Therapeutic Recreation Interventions: Stress management;Outing/community reintergration Patient destination: Home Follow Up Recommendations: None Equipment Recommended: To be determined   Skilled Therapeutic Intervention Session One: Pt seen for OT eval and ADL bathing/dressing session Pt in supine upon arrival, agreeable to tx session. Reports being pre-medicated prior to tx session, no pain with mobility. He transferred to sitting EOB with min A using  hospital bed functions with VCs for technique.  He ambulated into bathroom with min A using RW. Required steadying assist during clothing management and assist for buttock hygiene. Instructed in staggered stance for easier reach, however, still unable to reach for thoroughness.   He ambulated out of bathroom and completed bathing/dressing routine from w/c level at sink. Supervision/set-up UB bathing/dressing. Pt required max A for LB bathing/dressing as unable to reach LEs or cross LEs into figure four position, will trial AE in next session.  Pt returned to recliner at end of session, left seated with all needs in reach and chair pad alarm on. Education provided throughout session regarding role of OT, POC, OT goals, back pre-cautions, continuum of care and d/c planning.  Session Two: Pt seen for OT session focusing on AE training. Pt sitting up in recliner upon arrival, denied pain and agreeable to tx session.  He completed stand pivot transfers throughout session with min A using RW with VCs for proper hand placement and technique.  Ambulated into bathroom with min A and RW, sit<>stand from standard height toilet with min A. Completed hand hygiene and oral care standing at sink with steadying assist. Education and demonstration provided regarding use of reacher, sock aid, elastic shoe laces and toilet aid. Pt able to recall education taught on acute care for using reacher to doff socks and use of sock aid. Required min A for functional use. Instructed in how to use reacher to assist with threading on LB clothing. Able to don/doff shoes using elastic shoe laces and reacher with supervision/VCs.  Education/simulation for use of toilet aid, pt able to fully reach with use of aid to complete hygiene.  Pt returned to room at end of session and transitioned back to recliner. Left seated with all needs in reach and chair pad alarm on.   OT Evaluation Precautions/Restrictions  Precautions Precautions:  Back;Fall Required Braces or Orthoses: Spinal Brace Spinal Brace: Lumbar corset;Applied in sitting position Restrictions Weight Bearing Restrictions: No General Chart Reviewed:  Yes Home Living/Prior Functioning Home Living Family/patient expects to be discharged to:: Private residence Living Arrangements: Spouse/significant other Available Help at Discharge: Family Type of Home: House Home Access: Stairs to enter Technical brewer of Steps: 3 Entrance Stairs-Rails: Right Home Layout: One level Bathroom Shower/Tub: Multimedia programmer: Handicapped height  Lives With: [place] Responsibilities: No Current License: Yes Mode of Transportation: Car Occupation: Full time employment Type of Occupation: Architect Prior Function Level of Independence: Independent with gait, Independent with transfers, Independent with basic ADLs, Independent with homemaking with ambulation  Able to Take Stairs?: Yes Driving: Yes Vocation: Full time employment Vision Baseline Vision/History: Wears glasses Wears Glasses: Reading only Patient Visual Report: No change from baseline Vision Assessment?: No apparent visual deficits Perception  Perception: Within Functional Limits Praxis Praxis: Intact Cognition Overall Cognitive Status: Within Functional Limits for tasks assessed Arousal/Alertness: Awake/alert Orientation Level: Person;Place;Situation Person: Oriented Place: Oriented Situation: Oriented Year: 2020 Month: October Day of Week: Correct Memory: Appears intact Immediate Memory Recall: Sock;Blue;Bed Memory Recall Sock: Without Cue Memory Recall Blue: Without Cue Memory Recall Bed: Without Cue Awareness: Appears intact Problem Solving: Appears intact Safety/Judgment: Appears intact Sensation Sensation Light Touch: Appears Intact Proprioception: Appears Intact Coordination Gross Motor Movements are Fluid and Coordinated: No Fine Motor  Movements are Fluid and Coordinated: Yes Coordination and Movement Description: Impaired 2/2 generalized weakness and pain Motor  Motor Motor: Other (comment) Motor - Skilled Clinical Observations: Generalized weakness/deconditioning Trunk/Postural Assessment  Cervical Assessment Cervical Assessment: Within Functional Limits Thoracic Assessment Thoracic Assessment: Exceptions to WFL(Back pre-cautions) Lumbar Assessment Lumbar Assessment: Exceptions to WFL(back pre-cautions) Postural Control Postural Control: Deficits on evaluation  Balance Balance Balance Assessed: Yes Static Sitting Balance Static Sitting - Balance Support: Feet supported;No upper extremity supported Static Sitting - Level of Assistance: 5: Stand by assistance Static Sitting - Comment/# of Minutes: Sitting EOB Dynamic Sitting Balance Dynamic Sitting - Balance Support: Feet supported;During functional activity Dynamic Sitting - Level of Assistance: 5: Stand by assistance;4: Min assist Static Standing Balance Static Standing - Balance Support: During functional activity;Right upper extremity supported;Left upper extremity supported Static Standing - Level of Assistance: 4: Min assist Dynamic Standing Balance Dynamic Standing - Balance Support: During functional activity;Right upper extremity supported;Left upper extremity supported Dynamic Standing - Level of Assistance: 4: Min assist Dynamic Standing - Comments: Standing to complete LB clothing management/toileting task Extremity/Trunk Assessment RUE Assessment RUE Assessment: Within Functional Limits LUE Assessment LUE Assessment: Within Functional Limits     Refer to Care Plan for Long Term Goals  Recommendations for other services: Neuropsych and Therapeutic Recreation  Stress management and Outing/community reintegration   Discharge Criteria: Patient will be discharged from OT if patient refuses treatment 3 consecutive times without medical reason, if  treatment goals not met, if there is a change in medical status, if patient makes no progress towards goals or if patient is discharged from hospital.  The above assessment, treatment plan, treatment alternatives and goals were discussed and mutually agreed upon: by patient  Zaivion Kundrat L 10/13/2019, 12:47 PM

## 2019-10-14 ENCOUNTER — Inpatient Hospital Stay (HOSPITAL_COMMUNITY): Payer: BC Managed Care – PPO | Admitting: Physical Therapy

## 2019-10-14 ENCOUNTER — Inpatient Hospital Stay (HOSPITAL_COMMUNITY): Payer: BC Managed Care – PPO | Admitting: Occupational Therapy

## 2019-10-14 LAB — GLUCOSE, CAPILLARY
Glucose-Capillary: 149 mg/dL — ABNORMAL HIGH (ref 70–99)
Glucose-Capillary: 142 mg/dL — ABNORMAL HIGH (ref 70–99)
Glucose-Capillary: 121 mg/dL — ABNORMAL HIGH (ref 70–99)
Glucose-Capillary: 159 mg/dL — ABNORMAL HIGH (ref 70–99)

## 2019-10-14 MED ORDER — GABAPENTIN 300 MG PO CAPS
600.0000 mg | ORAL_CAPSULE | Freq: Two times a day (BID) | ORAL | Status: DC
  Administered 2019-10-14 – 2019-10-19 (×10): 600 mg via ORAL
  Filled 2019-10-14 (×11): qty 2

## 2019-10-14 MED ORDER — MELATONIN 3 MG PO TABS
3.0000 mg | ORAL_TABLET | Freq: Every day | ORAL | Status: DC
  Administered 2019-10-14 – 2019-10-18 (×5): 3 mg via ORAL
  Filled 2019-10-14 (×5): qty 1

## 2019-10-14 NOTE — Progress Notes (Signed)
Occupational Therapy Session Note  Patient Details  Name: Aaron Burns MRN: [id number] Date of Birth: [birth year]  Today's Date: 10/14/2019 OT Individual Time: 1000-1055 OT Individual Time Calculation (min): 55 min    Short Term Goals: Week 1:  OT Short Term Goal 1 (Week 1): STG=LTG due to LOS  Skilled Therapeutic Interventions/Progress Updates:    Patient seated in w/c, alert and ready for therapy.  Min A to donn bilateral shoes.  SPT and ambulation on unit to/from therapy treatment areas with RW CGA.  Reviewed basic reach and transport of items in kitchen environment with good carryover and demonstrates ability with CS/CGA and RW.  Completed UB ergometer 2 x 6 minutes.  Ambulated back to room with CS.  Completed LB stretching activities with good results and carryover of back precautions.  He remained sitting in recliner at close of session with seat alarm set and call bell in reach.    Therapy Documentation Precautions:  Precautions Precautions: Back, Fall Precaution Comments: Pt recalls 3/3 precautions Required Braces or Orthoses: Spinal Brace Spinal Brace: Lumbar corset, Applied in sitting position Restrictions Weight Bearing Restrictions: No General:   Vital Signs: Therapy Vitals Temp: 97.9 F (36.6 C) Temp Source: Oral Pulse Rate: 66 Resp: 18 BP: 104/70 Patient Position (if appropriate): Sitting Oxygen Therapy SpO2: 95 % O2 Device: Room Air Pain: Pain Assessment Pain Scale: 0-10 Pain Score: 2  Pain Location: Leg Pain Orientation: Right Pain Descriptors / Indicators: Tender Pain Intervention(s): Repositioned   Therapy/Group: Individual Therapy  Carlos Levering 10/14/2019, 4:06 PM

## 2019-10-14 NOTE — Progress Notes (Signed)
[facility] PHYSICAL MEDICINE & REHABILITATION PROGRESS NOTE   Subjective/Complaints:  Pt reports still having thigh pain- off and on- starts in buttocks and radiates to anterior thighs.  Wondering if med changes would help- it's new x24-48 hrs.   Objective:   Vas [place] Lower Extremity Venous (dvt)  Result Date: 10/13/2019  Lower Venous Study Indications: Status post lumbar fusion.  Comparison Study: No prior study on file for comparison Performing Technologist: Sharion Dove RVS  Examination Guidelines: A complete evaluation includes B-mode imaging, spectral Doppler, color Doppler, and power Doppler as needed of all accessible portions of each vessel. Bilateral testing is considered an integral part of a complete examination. Limited examinations for reoccurring indications may be performed as noted.  +---------+---------------+---------+-----------+----------+--------------+ RIGHT    CompressibilityPhasicitySpontaneityPropertiesThrombus Aging +---------+---------------+---------+-----------+----------+--------------+ CFV      Full           Yes      Yes                                 +---------+---------------+---------+-----------+----------+--------------+ SFJ      Full                                                        +---------+---------------+---------+-----------+----------+--------------+ FV Prox  Full                                                        +---------+---------------+---------+-----------+----------+--------------+ FV Mid   Full                                                        +---------+---------------+---------+-----------+----------+--------------+ FV DistalFull                                                        +---------+---------------+---------+-----------+----------+--------------+ PFV      Full                                                         +---------+---------------+---------+-----------+----------+--------------+ POP      Full           Yes      Yes                                 +---------+---------------+---------+-----------+----------+--------------+ PTV      Full                                                        +---------+---------------+---------+-----------+----------+--------------+  PERO     Full                                                        +---------+---------------+---------+-----------+----------+--------------+ Gastroc  None                                         Acute          +---------+---------------+---------+-----------+----------+--------------+   +---------+---------------+---------+-----------+----------+--------------+ LEFT     CompressibilityPhasicitySpontaneityPropertiesThrombus Aging +---------+---------------+---------+-----------+----------+--------------+ CFV      Full           Yes      Yes                                 +---------+---------------+---------+-----------+----------+--------------+ SFJ      Full                                                        +---------+---------------+---------+-----------+----------+--------------+ FV Prox  Full                                                        +---------+---------------+---------+-----------+----------+--------------+ FV Mid   Full                                                        +---------+---------------+---------+-----------+----------+--------------+ FV DistalFull                                                        +---------+---------------+---------+-----------+----------+--------------+ PFV      Full                                                        +---------+---------------+---------+-----------+----------+--------------+ POP      Full           Yes      Yes                                  +---------+---------------+---------+-----------+----------+--------------+ PTV      Full                                                        +---------+---------------+---------+-----------+----------+--------------+  PERO     Full                                                        +---------+---------------+---------+-----------+----------+--------------+ Gastroc  None                                         Acute          +---------+---------------+---------+-----------+----------+--------------+     Summary: Right: Findings consistent with acute deep vein thrombosis involving the right gastrocnemius veins. Left: Findings consistent with acute deep vein thrombosis involving the left gastrocnemius veins.  *See table(s) above for measurements and observations. Electronically signed by Deitra Mayo MD on 10/13/2019 at 6:23:58 AM.    Final    Recent Labs    10/13/19 0501  WBC 8.9  HGB 13.0  HCT 39.5  PLT 230   Recent Labs    10/13/19 0501  NA 138  K 3.9  CL 98  CO2 29  GLUCOSE 158*  BUN 15  CREATININE 0.88  CALCIUM 9.3    Intake/Output Summary (Last 24 hours) at 10/14/2019 0936 Last data filed at 10/14/2019 0810 Gross per 24 hour  Intake 720 ml  Output 600 ml  Net 120 ml     Physical Exam: Vital Signs Blood pressure 126/72, pulse 64, temperature 97.7 F (36.5 C), temperature source Oral, resp. rate 17, height 5\' 9"  (1.753 m), weight 113.5 kg, SpO2 95 %.  Physical Exam Nursing note, and vitalsreviewed. Constitutional: He appearswell-developedand well-nourished.No distress. Sitting in manual w/c with OT; wearing LSO, again- working on grooming at sink, NAD HENT:  Head:Normocephalicand atraumatic.  Nose:Nose normal.  Mouth/Throat:Oropharynx is clear and moist.  Eyes:conjugate gaze  Neck:Normal range of motion.Neck supple.No thyromegalypresent.  Cardiovascular: RRR Respiratory: CTA B/L- no wheezes, rales, or  rhonchi GI: (+)BS, soft, NT, ND Musculoskeletal:  Comments: HFs 4+/5; otherwise 5/5 in UEs and LEs B/L Neurological: He is alert. Psychiatric: less impulsive today Skin- IVs out   Assessment/Plan: 1. Functional deficits secondary to L3-L5 fusion which require 3+ hours per day of interdisciplinary therapy in a comprehensive inpatient rehab setting.  Physiatrist is providing close team supervision and 24 hour management of active medical problems listed below.  Physiatrist and rehab team continue to assess barriers to discharge/monitor patient progress toward functional and medical goals  Care Tool:  Bathing    Body parts bathed by patient: Right arm, Left arm, Chest, Abdomen, Front perineal area, Right upper leg, Left upper leg, Face   Body parts bathed by helper: Buttocks, Right lower leg, Left lower leg     Bathing assist Assist Level: Moderate Assistance - Patient 50 - 74%     Upper Body Dressing/Undressing Upper body dressing   What is the patient wearing?: Pull over shirt, Orthosis Orthosis activity level: Performed by patient  Upper body assist Assist Level: Set up assist    Lower Body Dressing/Undressing Lower body dressing      What is the patient wearing?: Underwear/pull up, Pants     Lower body assist Assist for lower body dressing: Maximal Assistance - Patient 25 - 49%     Toileting Toileting    Toileting assist Assist for toileting: Independent with assistive device Assistive Device Comment: rolling  walker   Transfers Chair/bed transfer  Transfers assist     Chair/bed transfer assist level: Minimal Assistance - Patient > 75% Chair/bed transfer assistive device: Programmer, multimedia   Ambulation assist      Assist level: Minimal Assistance - Patient > 75% Assistive device: Walker-rolling Max distance: 150'   Walk 10 feet activity   Assist     Assist level: Minimal Assistance - Patient > 75% Assistive device:  Walker-rolling   Walk 50 feet activity   Assist    Assist level: Minimal Assistance - Patient > 75% Assistive device: Walker-rolling    Walk 150 feet activity   Assist    Assist level: Minimal Assistance - Patient > 75% Assistive device: Walker-rolling    Walk 10 feet on uneven surface  activity   Assist Walk 10 feet on uneven surfaces activity did not occur: Safety/medical concerns         Wheelchair     Assist Will patient use wheelchair at discharge?: No             Wheelchair 50 feet with 2 turns activity    Assist            Wheelchair 150 feet activity     Assist          Blood pressure 126/72, pulse 64, temperature 97.7 F (36.5 C), temperature source Oral, resp. rate 17, height 5\' 9"  (1.753 m), weight 113.5 kg, SpO2 95 %.  Medical Problem List and Plan: 1.Decreased functional mobilitysecondary to degenerative disc disease with slight scoliosis and radiculopathy L3-4 and 4-5. Status post posterior instrumented fusionwith oblique exposureL3-L5 10/08/2019. Back brace with out of bed.Plan suture removal 2 weeks from day of surgery. 2. Antithrombotics: -DVT/anticoagulation:SCDs. Check vascular study (per Ortho, doesn't feel comfortable with additional anticoagulation) 10/28- Pt has new B/L gastroc DVTs- got OK from surgeon to put on Lovenox 40 mg daily- asked [place] to let them know if was therapeutic dosing; will recheck Vascular study in 1 week. -antiplatelet therapy: N/A.Patient on aspirin 81 mg daily prior to admission. Will discuss resuming with orthopedics Dr. Rolena Infante 3. Pain Management:Neurontin 300 mg 3 times daily,Norco,and Robaxin as needed  10/29- will increase Gabapentin for pt- to 600 mg BID x 3 days then increase again if required 4. Mood:Provide emotional support -antipsychotic agents: N/A 5. Neuropsych: This patientiscapable of making decisions on hisown behalf. 6. Skin/Wound  Care:Routine skin checks 7. Fluids/Electrolytes/Nutrition:Routine in and outs with follow-up chemistries 8. Diabetes mellitus. Hemoglobin A1c 7.3. SSI. Glucophage 500 mg 1 tablet breakfast and 2 tablets at supper prior to admission. Will con't to monitor and titrate to get better control of BGs. CBG (last 3)  Recent Labs    10/13/19 1728 10/13/19 2100 10/14/19 0543  GLUCAP 120* 139* 149*  10/29- improved control 9. Hypertension.Ziac5-6.25 mg daily. Will monitor based on starting to mobilize  10/29- BP controlled      LOS: 2 days A FACE TO FACE EVALUATION WAS PERFORMED  Genelda Roark 10/14/2019, 9:36 AM

## 2019-10-14 NOTE — Progress Notes (Signed)
Social Work Assessment and Plan   Patient Details  Name: Aaron Burns MRN: [id number] Date of Birth: 1956/06/13  Today's Date: 10/14/2019  Problem List:  Patient Active Problem List   Diagnosis Date Noted  . Lumbar radiculopathy 10/12/2019  . 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 FH: hypertension 11/26/2018  . Smoker 11/26/2018  . Morbid obesity (BMI 36.31)  06/07/2015  . Medication management 06/10/2014  . Vitamin D deficiency 11/30/2013  . Mixed hyperlipidemia 11/30/2013  . Hypertension   . Type 2 diabetes mellitus (HCC)    Past Medical History:  Past Medical History:  Diagnosis Date  . Diabetes mellitus, type 2 ([place])   . Elevated hemoglobin A1c   . Heart murmur    at birth  . History of kidney stones 2011  . Hypertension   . Low back pain    Radiates to left ankle.  Injured after falling on back.  . Morbid obesity (BMI 36. 06/07/2015  . 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 Renal disorder   . Tuberculosis    2014 dx, was treated with medications for about 4 months   Past Surgical History:  Past Surgical History:  Procedure Laterality Date  . ABDOMINAL EXPOSURE N/A 10/07/2019   Procedure: ABDOMINAL EXPOSURE;  Surgeon: Rosetta Posner, MD;  Location: [facility] OR;  Service: Vascular;  Laterality: N/A;  . ANTERIOR LUMBAR FUSION N/A 10/07/2019   Procedure: Oblique lateral interbody fusion LUMBAR FOUR-FIVE, anterior lateral interbody fusion LUMBAR THREE-FOUR;  Surgeon: Melina Schools, MD;  Location: [facility];  Service: Orthopedics;  Laterality: N/A;  TAB BLOCK WITH EXPAREL  . CHOLECYSTECTOMY  07/27/2012   Procedure: LAPAROSCOPIC CHOLECYSTECTOMY WITH INTRAOPERATIVE CHOLANGIOGRAM;  Surgeon: Marcello Moores A. Cornett, MD;  Location: [facility];  Service: General;  Laterality: N/A;  . COLONOSCOPY    . CYSTOSCOPY/RETROGRADE/URETEROSCOPY/STONE EXTRACTION WITH BASKET  5/11  . HERNIA REPAIR     as a baby  . KNEE SURGERY Right 11/25/11 and 2015   arthroscopic, cortisone injections done also  . TOTAL KNEE ARTHROPLASTY Right 06/06/2015   Procedure: RIGHT TOTAL KNEE ARTHROPLASTY;  Surgeon: Paralee Cancel, MD;  Location: [facility] ORS;  Service: Orthopedics;  Laterality: Right;  . WISDOM TOOTH EXTRACTION  yrs ago   Social History:  reports that he quit smoking about 2 weeks ago. His smoking use included cigarettes. He has a 7.50 pack-year smoking history. He quit smokeless tobacco use about 5 years ago. He reports current alcohol use. He reports that he does not use drugs.  Family / Support Systems Marital Status: Married Patient Roles: Spouse Spouse/Significant Other: wife, Aaron Burns @ [phone] Anticipated Caregiver: Hilda Blades Ability/Limitations of Caregiver: Supervision Caregiver Availability: 24/7 Family Dynamics: Pt notes wife "is there 24/7" and notes she can provide any assistance needed.  Social History Preferred language: English Religion: None Cultural Background: NA Read: Yes Write: Yes Employment Status: Employed Return to Work Plans: Pt plans to return to work when medically cleared to do so. Legal History/Current Legal Issues: None Guardian/Conservator: None - per MD, pt is capable of making decisions on his own behalf.   Abuse/Neglect Abuse/Neglect Assessment Can Be Completed: Yes Physical Abuse: Denies Verbal Abuse: Denies Sexual Abuse: Denies Exploitation of patient/patient's resources: Denies Self-Neglect: Denies  Emotional Status Pt's affect, behavior and adjustment status: Pt sitting up in chair, very pleasant and able to complete assessment interview without difficulty. He is hopeful for a very short stay on CIR and notes his wife is available to  assist.  No emotional distress noted - will monitor. Recent Psychosocial Issues: None Psychiatric History: None Substance Abuse History: None  Patient / Family Perceptions, Expectations & Goals Pt/Family understanding of illness & functional limitations: Pt and wife  with good understanding of surgery performed and current functional limitations/ need for CIR. Premorbid pt/family roles/activities: Independent and working f/t Anticipated changes in roles/activities/participation: None Pt/family expectations/goals: Pt hopeful for short LOS  [employer]: None Premorbid Home Care/DME Agencies: Other (Comment)(was having PT session at orthopedic office - plans to resume) Transportation available at discharge: yes  Discharge Planning Living Arrangements: Spouse/significant other Support Systems: Spouse/significant other Type of Residence: Private residence Insurance Resources: Multimedia programmer (specify)([employer] of [place]) Museum/gallery curator Resources: Employment Financial Screen Referred: No Living Expenses: Medical laboratory scientific officer Management: Patient Does the patient have any problems obtaining your medications?: No Home Management: pt and wife Patient/Family Preliminary Plans: Pt to d/c home with wife as caregiver. Social Work Anticipated Follow Up Needs: HH/OP Expected length of stay: 5-7 days  Clinical Impression Pleasant gentleman here on CIR following back surgery.  Making steady progress and he is hopeful for a short LOS.  Wife at home and able to provide 24/7 support.  Will follow for d/c planning needs.  Arlethia Basso 10/14/2019, 4:26 PM

## 2019-10-14 NOTE — Progress Notes (Signed)
Physical Therapy Session Note  Patient Details  Name: Aaron Burns MRN: [id number] Date of Birth: [birth year]  Today's Date: 10/14/2019 PT Individual Time: 0800-0900; 1330-1430 PT Individual Time Calculation (min): 60 min and 60 min  Short Term Goals: Week 1:  PT Short Term Goal 1 (Week 1): =LTG due to ELOS  Skilled Therapeutic Interventions/Progress Updates:    Session 1: Pt received seated in recliner in room, agreeable to PT session. Pt reports ongoing nerve pain in low back to R thigh region, MD aware and to prescribe medication to target nerve pain. Otherwise pt premedicated prior to start of therapy session. Manual w/c propulsion x 150 ft with use of BUE for global endurance training. Pt is CGA to min A for sit to stand to RW throughout session. Ambulation x 100', x 150' with RW and CGA, narrow BOS and flexed trunk posture. Pt reports increase in R thigh pain during gait. Standing alt L/R 4" step ups with BUE support and min A for balance for LE strengthening. Standing alt L/R forward steps with no UE support and min A for balance to decrease reliance on UE support with standing. Sidesteps L/R 2 x 10 ft each direction with RW and CGA to simulate side-stepping through narrow doorways. Toilet transfer with min A and use of RW. See Flowsheet for details. Standing balance with RW and CGA while washing hands and performing oral hygiene at sink. Pt left seated in recliner in room with needs in reach at end of session.  Session 2: Pt received seated in recliner in room, agreeable to PT session. No complaints of pain. Sit to stand with min A to RW throughout session. Sit to supine with min A for RLE management on real bed in simulation apartment to simulate home environment. Reviewed log-rolling technique. Supine to sit with min A progressing to close SBA from real bed. Per pt his bed at home is taller and has [facility] adjustability. Recommended use of bedrail or leg lifter if needed for increased independence  with sit to supine. Ascend/descend 4" curb step with RW and CGA, v/c for safe RW management. Nustep level 3 x 10 min with use of B UE/LE for global endurance training. Manual w/c propulsion 2 x 150 ft with use of BUE and Supervision for global endurance training. Pt left seated on toilet with call button in reach, NT notified at end of session.  Therapy Documentation Precautions:  Precautions Precautions: Back, Fall Precaution Comments: Pt recalls 3/3 precautions Required Braces or Orthoses: Spinal Brace Spinal Brace: Lumbar corset, Applied in sitting position Restrictions Weight Bearing Restrictions: No    Therapy/Group: Individual Therapy   Excell Seltzer, PT, DPT  10/14/2019, 12:16 PM

## 2019-10-14 NOTE — Progress Notes (Signed)
Physical Therapy Session Note  Patient Details  Name: Aaron Burns MRN: [id number] Date of Birth: 02-04-1956  Today's Date: 10/14/2019 PT Individual Time: 1650-1730 PT Individual Time Calculation (min): 40 min   Short Term Goals: Week 1:  PT Short Term Goal 1 (Week 1): =LTG due to ELOS  Skilled Therapeutic Interventions/Progress Updates:   Pt received sitting in recliner and agreeable to PT. Stand pivot transfer to [facility] with supervision assist and RW for safety. WC mobility to RN station x 129f withou cues or assist from PT while navigating hall and doorway.   Dynamic gait training with RW to weave through 4 cones, over blue unlevel foam mat, and over 1 inch obstacle; completed x 4 with supervision assist and min cues for AD management and gait pattern to increase step width. Mild glute pain noted at end of each bout.   PT instructed pt in [place] with BUE support on parallel bars: hip abduction, HS flexion, mini squats, tandem stance, calf raises. Each completed x 10BLE with cues for full ROM and technique to prevent stress on low back and Bil knees.  Side stepping and forward/backward gait in parallel bars 2 x 174feach with cues for posture and decreased compensations to force use of glutes.   Additional gait training over level surface through hall x 20052fith supervision assist and min cues for posture and step width intermittently throughout. Patient returned to room and left sitting in recliner with call bell in reach and all needs met.         Therapy Documentation Precautions:  Precautions Precautions: Back, Fall Precaution Comments: Pt recalls 3/3 precautions Required Braces or Orthoses: Spinal Brace Spinal Brace: Lumbar corset, Applied in sitting position Restrictions Weight Bearing Restrictions: No    Pain: Pain Assessment Pain Scale: 0-10 Pain Score: 2  Pain Location: Leg Pain Orientation: Right Pain Descriptors / Indicators: Tender Pain  Intervention(s): Repositioned   Therapy/Group: Individual Therapy  AusLorie Phenix/29/2020, 5:36 PM

## 2019-10-14 NOTE — Plan of Care (Signed)
  Problem: SCI BLADDER ELIMINATION Goal: RH STG MANAGE BLADDER WITH ASSISTANCE Description: STG Manage Bladder With Mod I Assistance Outcome: Progressing   Problem: RH SKIN INTEGRITY Goal: RH STG SKIN FREE OF INFECTION/BREAKDOWN Description: No new breakdown or infection with min assist  Outcome: Progressing   Problem: RH PAIN MANAGEMENT Goal: RH STG PAIN MANAGED AT OR BELOW PT'S PAIN GOAL Description: < 3 out of 10.  Outcome: Progressing

## 2019-10-14 NOTE — Care Management (Signed)
[facility] Individual Statement of Services  Patient Name:  Aaron Burns  Date:  10/14/2019  Welcome to the [facility].  Our goal is to provide you with an individualized program based on your diagnosis and situation, designed to meet your specific needs.  With this comprehensive rehabilitation program, you will be expected to participate in at least 3 hours of rehabilitation therapies Monday-Friday, with modified therapy programming on the weekends.  Your rehabilitation program will include the following services:  Physical Therapy (PT), Occupational Therapy (OT), 24 hour per day rehabilitation nursing, Case Management (Social Worker), Rehabilitation Medicine, Nutrition Services and Pharmacy Services  Weekly team conferences will be held on Wednesdays to discuss your progress.  Your Social Worker will talk with you frequently to get your input and to update you on team discussions.  Team conferences with you and your family in attendance may also be held.  Expected length of stay: 5-7 days   Overall anticipated outcome: modified independent  Depending on your progress and recovery, your program may change. Your Social Worker will coordinate services and will keep you informed of any changes. Your Social Worker's name and contact numbers are listed  below.  The following services may also be recommended but are not provided by the [facility] will be made to provide these services after discharge if needed.  Arrangements include referral to agencies that provide these services.  Your insurance has been verified to be:  [place] Your primary doctor is:  Research officer, political party  Pertinent information will be shared with your doctor and your insurance company.  Social Worker:  [place], [place] or ([phone]    Information discussed with and copy given to patient by: Lennart Pall, 10/14/2019, 4:27 PM

## 2019-10-15 ENCOUNTER — Inpatient Hospital Stay (HOSPITAL_COMMUNITY): Payer: BC Managed Care – PPO | Admitting: Occupational Therapy

## 2019-10-15 ENCOUNTER — Inpatient Hospital Stay (HOSPITAL_COMMUNITY): Payer: BC Managed Care – PPO | Admitting: Physical Therapy

## 2019-10-15 LAB — GLUCOSE, CAPILLARY
Glucose-Capillary: 145 mg/dL — ABNORMAL HIGH (ref 70–99)
Glucose-Capillary: 115 mg/dL — ABNORMAL HIGH (ref 70–99)
Glucose-Capillary: 90 mg/dL (ref 70–99)
Glucose-Capillary: 146 mg/dL — ABNORMAL HIGH (ref 70–99)

## 2019-10-15 NOTE — Progress Notes (Signed)
Physical Therapy Session Note  Patient Details  Name: Aaron Burns MRN: [id number] Date of Birth: 1956-10-13  Today's Date: 10/15/2019 PT Individual Time: 1000-1100; 1300-1410 PT Individual Time Calculation (min): 60 min and 70 min  Short Term Goals: Week 1:  PT Short Term Goal 1 (Week 1): =LTG due to ELOS  Skilled Therapeutic Interventions/Progress Updates:    Session 1: Pt received seated in recliner in room, agreeable to PT session. No complaints of pain at rest, does have onset of R thigh and R-side low back pain with mobility, declines intervention. Sit to stand with Supervision to RW throughout session. Ambulation to bathroom with RW and Supervision. Toilet transfer with Supervision. Ambulation x 200 ft with RW at Supervision level, flexed trunk posture. Standing BLE strengthening therex in // bars: hip abd, marches, HS curls, hip ext, heel/toe raises x 10 reps each; mini-squats 2 x 10 reps. Pt left seated in recliner in room with needs in reach at end of session.  Session 2: Pt received seated in recliner in room, agreeable to PT session. No complaints of pain. Pt is Supervision to stand to RW throughout session. Ambulation 2 x 150 ft with RW and Supervision. Ascend/descend one 4" curb with RW and CGA. Session focus on dynamic standing balance with RW and overall Supervision to CGA: object step over and cone weaving; cone tapping with forward gait; sidesteps; amb across soft pliable surface of mat; amb across uneven ground. Sit to/from supine with CGA with HOB slighted elevated and use of R bedrail. Per pt his bed at home is adjustable and provided pt with handout for where to purchase bedrail. Pt left seated in recliner in room with needs in reach at end of session.  Therapy Documentation Precautions:  Precautions Precautions: Back, Fall Precaution Comments: Pt recalls 3/3 precautions Required Braces or Orthoses: Spinal Brace Spinal Brace: Lumbar corset, Applied in sitting  position Restrictions Weight Bearing Restrictions: No    Therapy/Group: Individual Therapy   Excell Seltzer, PT, DPT  10/15/2019, 3:19 PM

## 2019-10-15 NOTE — IPOC Note (Signed)
Overall Plan of Care [facility]) Patient Details Name: Aaron Burns MRN: [id number] DOB: [birth year]  Admitting Diagnosis: Lumbar radiculopathy  Hospital Problems: Principal Problem:   Lumbar radiculopathy Active Problems:   Hypertension   Type 2 diabetes mellitus ([place])   Fusion of lumbosacral spine     Functional Problem List: Nursing Skin Integrity, Bladder, Bowel, Endurance, Motor, Pain  PT Endurance, Motor, Pain, Safety  OT Balance, Safety, Endurance, Motor, Pain  SLP    TR         Basic ADL's: OT Grooming, Bathing, Dressing, Toileting     Advanced  ADL's: OT       Transfers: PT Bed Mobility, Bed to Chair, Car, [employer], Floor  OT Toilet, Metallurgist: PT Ambulation, Emergency planning/management officer, Stairs     Additional Impairments: OT None  SLP        TR      Anticipated Outcomes Item Anticipated Outcome  Self Feeding Indep  Swallowing      Basic self-care  Supervision-mod I  Toileting  Mod I   Bathroom Transfers Supervision-mod I  Bowel/Bladder  Pt will manage bowel and bladder with min assist  Transfers  mod I  Locomotion  mod I with LRAD  Communication     Cognition     Pain  Pt will manage pain at 3 or less on a scale of 0-10.  Safety/Judgment  Pt will remain free of falls with injury while in rehab with min assist   Therapy Plan: PT Intensity: Minimum of 1-2 x/day ,45 to 90 minutes PT Frequency: 5 out of 7 days PT Duration Estimated Length of Stay: 5-7 days OT Intensity: Minimum of 1-2 x/day, 45 to 90 minutes OT Frequency: 5 out of 7 days OT Duration/Estimated Length of Stay: 5-7 days     Due to the current state of emergency, patients may not be receiving their 3-hours of Medicare-mandated therapy.   Team Interventions: Nursing Interventions Patient/Family Education, Medication Management, Bladder Management, Bowel Management, Pain Management, Discharge Planning  PT interventions Ambulation/gait training, Balance/vestibular  training, Community reintegration, Discharge planning, DME/adaptive equipment instruction, Functional mobility training, Neuromuscular re-education, Pain management, Patient/family education, Psychosocial support, Stair training, Therapeutic Activities, Therapeutic Exercise, UE/LE Strength taining/ROM, UE/LE Coordination activities  OT Interventions Balance/vestibular training, Discharge planning, Pain management, Self Care/advanced ADL retraining, Therapeutic Activities, UE/LE Coordination activities, Therapeutic Exercise, Skin care/wound managment, Patient/family education, Functional mobility training, Community reintegration, DME/adaptive equipment instruction, Neuromuscular re-education, Psychosocial support, UE/LE Strength taining/ROM  SLP Interventions    TR Interventions    SW/CM Interventions Discharge Planning, Psychosocial Support, Patient/Family Education   Barriers to Discharge MD  Medical stability, Lack of/limited family support, Medication compliance and Behavior  Nursing      PT Medical stability    OT      SLP      SW       Team Discharge Planning: Destination: PT-Home ,OT- Home , SLP-  Projected Follow-up: PT-Home health PT, OT-  None, SLP-  Projected Equipment Needs: PT-None recommended by PT, OT- To be determined, SLP-  Equipment Details: PT-pt already owns RW, OT-  Patient/family involved in discharge planning: PT- Patient,  OT-Patient, SLP-   MD ELOS: 5-7 days Medical Rehab Prognosis:  Good Assessment: Pt is a 63 yr old male with L3-5 fusion 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  Goals supervision to mod I.   See Team Conference Notes for weekly updates to the  plan of care

## 2019-10-15 NOTE — Progress Notes (Signed)
[place] PHYSICAL MEDICINE & REHABILITATION PROGRESS NOTE   Subjective/Complaints:  Pt reports got a shower this AM- pain around 4-5/10- no higher than that- thigh pain overall better.   Had surgery 10/22 and 10/23, so needs honeycomb dressing removed.   ROS- denies SOB, CP N/V/D. Objective:   No results found. Recent Labs    10/13/19 0501  WBC 8.9  HGB 13.0  HCT 39.5  PLT 230   Recent Labs    10/13/19 0501  NA 138  K 3.9  CL 98  CO2 29  GLUCOSE 158*  BUN 15  CREATININE 0.88  CALCIUM 9.3    Intake/Output Summary (Last 24 hours) at 10/15/2019 0847 Last data filed at 10/14/2019 1830 Gross per 24 hour  Intake 946 ml  Output -  Net 946 ml     Physical Exam: Vital Signs Blood pressure 120/70, pulse 66, temperature 97.8 F (36.6 C), resp. rate 18, height 5\' 9"  (1.753 m), weight 113.5 kg, SpO2 97 %.  Physical Exam Nursing note, and vitalsreviewed. Constitutional: He appearswell-developedand well-nourished.No distress. Sitting in manual w/c with OT; just got out of shower, NAD HENT:  Head:Normocephalicand atraumatic.  Nose:Nose normal.  Mouth/Throat:Oropharynx is clear and moist.  Eyes:conjugate gaze  Neck:Normal range of motion.Neck supple.No thyromegalypresent.  Cardiovascular: RRR Respiratory: CTA B/L- no wheezes, rales, or rhonchi GI: (+)BS, soft, NT, ND Musculoskeletal:  Comments: HFs 4+/5; otherwise 5/5 in UEs and LEs B/L Neurological: He is alert. Psychiatric: less impulsive today Skin- IVs out; honeycomb dressing look good- time to take off.   Assessment/Plan: 1. Functional deficits secondary to L3-L5 fusion which require 3+ hours per day of interdisciplinary therapy in a comprehensive inpatient rehab setting.  Physiatrist is providing close team supervision and 24 hour management of active medical problems listed below.  Physiatrist and rehab team continue to assess barriers to discharge/monitor patient  progress toward functional and medical goals  Care Tool:  Bathing    Body parts bathed by patient: Right arm, Left arm, Chest, Abdomen, Front perineal area, Right upper leg, Left upper leg, Face, Buttocks, Right lower leg, Left lower leg   Body parts bathed by helper: Buttocks, Right lower leg, Left lower leg     Bathing assist Assist Level: Contact Guard/Touching assist     Upper Body Dressing/Undressing Upper body dressing   What is the patient wearing?: Pull over shirt, Orthosis Orthosis activity level: Performed by patient  Upper body assist Assist Level: Independent    Lower Body Dressing/Undressing Lower body dressing      What is the patient wearing?: Underwear/pull up, Pants     Lower body assist Assist for lower body dressing: Supervision/Verbal cueing(Reacher)     Toileting Toileting    Toileting assist Assist for toileting: Supervision/Verbal cueing Assistive Device Comment: rolling walker   Transfers Chair/bed transfer  Transfers assist     Chair/bed transfer assist level: Supervision/Verbal cueing Chair/bed transfer assistive device: Programmer, multimedia   Ambulation assist      Assist level: Minimal Assistance - Patient > 75% Assistive device: Walker-rolling Max distance: 150'   Walk 10 feet activity   Assist     Assist level: Minimal Assistance - Patient > 75% Assistive device: Walker-rolling   Walk 50 feet activity   Assist    Assist level: Minimal Assistance - Patient > 75% Assistive device: Walker-rolling    Walk 150 feet activity   Assist    Assist level: Minimal Assistance - Patient > 75% Assistive device: Walker-rolling  Walk 10 feet on uneven surface  activity   Assist Walk 10 feet on uneven surfaces activity did not occur: Safety/medical concerns         Wheelchair     Assist Will patient use wheelchair at discharge?: No             Wheelchair 50 feet with 2 turns  activity    Assist            Wheelchair 150 feet activity     Assist          Blood pressure 120/70, pulse 66, temperature 97.8 F (36.6 C), resp. rate 18, height 5\' 9"  (1.753 m), weight 113.5 kg, SpO2 97 %.  Medical Problem List and Plan: 1.Decreased functional mobilitysecondary to degenerative disc disease with slight scoliosis and radiculopathy L3-4 and 4-5. Status post posterior instrumented fusionwith oblique exposureL3-L5 10/08/2019. Back brace with out of bed.Plan suture removal 2 weeks from day of surgery. 2. Antithrombotics: -DVT/anticoagulation:SCDs. Check vascular study (per Ortho, doesn't feel comfortable with additional anticoagulation) 10/28- Pt has new B/L gastroc DVTs- got OK from surgeon to put on Lovenox 40 mg daily- asked [place] to let them know if was therapeutic dosing; will recheck Vascular study in 1 week. -antiplatelet therapy: N/A.Patient on aspirin 81 mg daily prior to admission. Will discuss resuming with orthopedics Dr. Rolena Infante 3. Pain Management:Neurontin 300 mg 3 times daily,Norco,and Robaxin as needed  10/29- will increase Gabapentin for pt- to 600 mg BID x 3 days then increase again if required 4. Mood:Provide emotional support -antipsychotic agents: N/A 5. Neuropsych: This patientiscapable of making decisions on hisown behalf. 6. Skin/Wound Care:Routine skin checks 7. Fluids/Electrolytes/Nutrition:Routine in and outs with follow-up chemistries 8. Diabetes mellitus. Hemoglobin A1c 7.3. SSI. Glucophage 500 mg 1 tablet breakfast and 2 tablets at supper prior to admission. Will con't to monitor and titrate to get better control of BGs. CBG (last 3)  Recent Labs    10/14/19 1645 10/14/19 2058 10/15/19 0612  GLUCAP 121* 159* 145*  10/29- improved control 9. Hypertension.Ziac5-6.25 mg daily. Will monitor based on starting to mobilize  10/29- BP controlled      LOS: 3 days A FACE  TO FACE EVALUATION WAS PERFORMED  Yuleimy Kretz 10/15/2019, 8:47 AM

## 2019-10-15 NOTE — Progress Notes (Signed)
Occupational Therapy Session Note  Patient Details  Name: Aaron Burns MRN: [id number] Date of Birth: July 01, 1956  Today's Date: 10/15/2019 OT Individual Time: [id number] OT Individual Time Calculation (min): 60 min    Short Term Goals: Week 1:  OT Short Term Goal 1 (Week 1): STG=LTG due to LOS  Skilled Therapeutic Interventions/Progress Updates:    Pt seen for OT ADL Bathing/dressing session. Pt sitting up in recliner upon arrival, agreeable to tx session and denying pain.   Transfers/mobility: Ambulated throughout room with RW and supervision, requiring max VCs for RW management in functional context. Sit<>stand with supervision/guarding assist from recliner, toilet, and tub bench. Ambulated within unit to ADL apartment with supervision and RW. Sit<>stand from low soft surface couch with close supervision and VCs for technique.   ADL re-training: Toileting task completed at [place] I level. Use of toilet aid to complete buttock hygiene with set-up/supervision. Bathed seated on TTB, standing with grab bars and use of toilet aid.  Dressed seated in w/c. Mod I UB dressing. Used reacher to thread underwear/pants and stood with RW to pull pants up with supervision. Grooming tasks standing at sink with supervision. Throughout session required VC for adherence to back pre-cautions in functional context.   Pt returned to room at end of session, left seated in recliner with all needs in reach and chair pad alarm on.   Therapy Documentation Precautions:  Precautions Precautions: Back, Fall Precaution Comments: Pt recalls 3/3 precautions Required Braces or Orthoses: Spinal Brace Spinal Brace: Lumbar corset, Applied in sitting position Restrictions Weight Bearing Restrictions: No   Therapy/Group: Individual Therapy  Dannya Pitkin L 10/15/2019, 7:00 AM

## 2019-10-16 ENCOUNTER — Inpatient Hospital Stay (HOSPITAL_COMMUNITY): Payer: BC Managed Care – PPO | Admitting: Physical Therapy

## 2019-10-16 ENCOUNTER — Inpatient Hospital Stay (HOSPITAL_COMMUNITY): Payer: BC Managed Care – PPO | Admitting: Occupational Therapy

## 2019-10-16 LAB — GLUCOSE, CAPILLARY
Glucose-Capillary: 154 mg/dL — ABNORMAL HIGH (ref 70–99)
Glucose-Capillary: 144 mg/dL — ABNORMAL HIGH (ref 70–99)
Glucose-Capillary: 113 mg/dL — ABNORMAL HIGH (ref 70–99)
Glucose-Capillary: 164 mg/dL — ABNORMAL HIGH (ref 70–99)

## 2019-10-16 NOTE — Plan of Care (Signed)
  Problem: Consults Goal: RH SPINAL CORD INJURY PATIENT EDUCATION Description:  See Patient Education module for education specifics.  Outcome: Progressing   Problem: RH SKIN INTEGRITY Goal: RH STG SKIN FREE OF INFECTION/BREAKDOWN Description: No new breakdown or infection with min assist  Outcome: Progressing Goal: RH STG ABLE TO PERFORM INCISION/WOUND CARE W/ASSISTANCE Description: STG Able To Perform Incision/Wound Care With min Assistance. Outcome: Progressing   Problem: RH PAIN MANAGEMENT Goal: RH STG PAIN MANAGED AT OR BELOW PT'S PAIN GOAL Description: < 3 out of 10.  Outcome: Progressing   Problem: RH KNOWLEDGE DEFICIT SCI Goal: RH STG INCREASE KNOWLEDGE OF SELF CARE AFTER SCI Description: Pt will be able to demonstrate proper care after SCI including back precautions, donning and doffing brace, and wound care with min assist/cues  Outcome: Progressing

## 2019-10-16 NOTE — Progress Notes (Signed)
Physical Therapy Session Note  Patient Details  Name: Aaron Burns MRN: [id number] Date of Birth: 1956/09/02  Today's Date: 10/16/2019 PT Individual Time: 0910-1005 PT Individual Time Calculation (min): 55 min   Short Term Goals: Week 1:  PT Short Term Goal 1 (Week 1): =LTG due to ELOS  Skilled Therapeutic Interventions/Progress Updates:   Pt received sitting in recliner and agreeable to PT. PT instructed pt in gait training through simulated community environment of food court and over cement side walk with supervision assist and RW, 3 x [id number]+. Additional gait [facility] without AD x [id number] CGA for safety.   Dynamic balance training with dynavision; program A, 60 sec, on level surface x 2 and on blue wedge x 2. Distant supervision assist fro mPT with only min cues for use of ankle strategy to prevent posterior LOB.   Dynamic gait training to weave through 8 cones x 2 without AD and CGA-supervision assist  Min cues for decreased step length and improved step width to improved Stability and BOS.   Patient returned to room. Pt requesting to use BM for bowel movement. Ambulatory transfer to toilet without AD and supervision assist for safety, pt able to complete clothing management and pericare without assist from PT. Pt  left sitting in WC with call bell in reach and all needs met.        Therapy Documentation Precautions:  Precautions Precautions: Back, Fall Precaution Comments: Pt recalls 3/3 precautions Required Braces or Orthoses: Spinal Brace Spinal Brace: Lumbar corset, Applied in sitting position Restrictions Weight Bearing Restrictions: No  Vital Signs: Therapy Vitals Temp: 97.8 F (36.6 C) Temp Source: Oral Pulse Rate: 71 Resp: 19 BP: 118/74 Patient Position (if appropriate): Sitting Oxygen Therapy SpO2: 97 % O2 Device: Room Air Pain: Pain Assessment Pain Scale: 0-10 Pain Score: 2  Pain Type: Acute pain;Surgical  pain Pain Location: Back Pain Orientation: Lower;Right Pain Descriptors / Indicators: Aching Pain Frequency: Intermittent Pain Onset: With Activity Pain Intervention(s): Refused   Therapy/Group: Individual Therapy  AuLorie Phenix0/31/2020, 10:06 AM

## 2019-10-16 NOTE — Progress Notes (Signed)
Physical Therapy Session Note  Patient Details  Name: Aaron Burns MRN: [id number] Date of Birth: Jun 19, 1956  Today's Date: 10/16/2019 PT Individual Time: 0815-0858 PT Individual Time Calculation (min): 43 min   Short Term Goals: Week 1:  PT Short Term Goal 1 (Week 1): =LTG due to ELOS  Skilled Therapeutic Interventions/Progress Updates:   Missed 15 min of skilled PT 2/2 nursing care and MD present. Pt in recliner and agreeable to therapy, pain as detailed below. Pt c/o that the bed alarm goes off when he gets up to toilet in the middle of the night. Educated pt on importance of supervision for these tasks. Pt also confused regarding d/c date and expressed some frustration about that. MD and PT both in agreement of d/c date set at 11/3 and unsure where pt received information that he was leaving this morning. Pt verbalized understanding and in agreement w/ discussion and education regarding importance of staying until set date of 11/3. Ambulated to therapy gym w/ supervision using RW. Performed BLE strengthening and endurance exercises as listed below. Pain remained 2/10 throughout session, brief seated rest breaks 2/2 fatigue.   BLE strengthening/endurance exercises: -partial knee bends 3x10  -standing knee marches 3x5 each LE  -6" anterior step ups 3x10 each LE  -NuStep 7 min @ level 5 w/ all extremities  Ambulated back to room w/ supervision w/ RW, ended session in recliner and all needs in reach.   Therapy Documentation Precautions:  Precautions Precautions: Back, Fall Precaution Comments: Pt recalls 3/3 precautions Required Braces or Orthoses: Spinal Brace Spinal Brace: Lumbar corset, Applied in sitting position Restrictions Weight Bearing Restrictions: No Vital Signs: Therapy Vitals Temp: 97.8 F (36.6 C) Temp Source: Oral Pulse Rate: 71 Resp: 19 BP: 118/74 Patient Position (if appropriate): Sitting Oxygen Therapy SpO2: 97 % O2 Device: Room Air Pain: Pain  Assessment Pain Scale: 0-10 Pain Score: 2  Pain Type: Acute pain;Surgical pain Pain Location: Back Pain Orientation: Lower;Right Pain Descriptors / Indicators: Aching Pain Frequency: Intermittent Pain Onset: With Activity Pain Intervention(s): Refused  Therapy/Group: Individual Therapy  Jaimi Belle Clent Demark 10/16/2019, 8:58 AM

## 2019-10-16 NOTE — Progress Notes (Signed)
[facility] PHYSICAL MEDICINE & REHABILITATION PROGRESS NOTE   Subjective/Complaints:  Pt reports "bad attitude" this AM; little pain in thighs; slept better in the chair last night- has 1 at home to sleep in; said throat still "messed up from surgery" raspy  ROS- denies SOB, CP N/V/D. Objective:   No results found. No results for input(s): WBC, HGB, HCT, PLT in the last 72 hours. No results for input(s): NA, K, CL, CO2, GLUCOSE, BUN, CREATININE, CALCIUM in the last 72 hours.  Intake/Output Summary (Last 24 hours) at 10/16/2019 1144 Last data filed at 10/16/2019 0936 Gross per 24 hour  Intake 840 ml  Output 1425 ml  Net -585 ml     Physical Exam: Vital Signs Blood pressure 118/74, pulse 71, temperature 97.8 F (36.6 C), temperature source Oral, resp. rate 19, height 5\' 9"  (1.753 m), weight 113.5 kg, SpO2 97 %.  Physical Exam Nursing note, and vitalsreviewed. Constitutional: He appearswell-developedand well-nourished.No distress. Standing using RW with PT, NAD HENT:  Head:Normocephalicand atraumatic.  Nose:Nose normal.  Mouth/Throat:Oropharynx is clear and moist.  Eyes:conjugate gaze  Neck:Normal range of motion.Neck supple.No thyromegalypresent.  Cardiovascular: RRR Respiratory: CTA B/L- no wheezes, rales, or rhonchi GI: (+)BS, soft, NT, ND Musculoskeletal:  Comments: HFs 4+/5; otherwise 5/5 in UEs and LEs B/L Neurological: He is alert. Psychiatric: less impulsive today Skin back and side-incisions look great- no drainage; no erythema seen   Assessment/Plan: 1. Functional deficits secondary to L3-L5 fusion which require 3+ hours per day of interdisciplinary therapy in a comprehensive inpatient rehab setting.  Physiatrist is providing close team supervision and 24 hour management of active medical problems listed below.  Physiatrist and rehab team continue to assess barriers to discharge/monitor patient progress toward functional and  medical goals  Care Tool:  Bathing    Body parts bathed by patient: Right arm, Left arm, Chest, Abdomen, Front perineal area, Right upper leg, Left upper leg, Face, Buttocks, Right lower leg, Left lower leg   Body parts bathed by helper: Buttocks, Right lower leg, Left lower leg     Bathing assist Assist Level: Contact Guard/Touching assist     Upper Body Dressing/Undressing Upper body dressing   What is the patient wearing?: Pull over shirt, Orthosis Orthosis activity level: Performed by patient  Upper body assist Assist Level: Independent    Lower Body Dressing/Undressing Lower body dressing      What is the patient wearing?: Underwear/pull up, Pants     Lower body assist Assist for lower body dressing: Supervision/Verbal cueing(Reacher)     Toileting Toileting    Toileting assist Assist for toileting: Supervision/Verbal cueing Assistive Device Comment: rolling walker   Transfers Chair/bed transfer  Transfers assist     Chair/bed transfer assist level: Supervision/Verbal cueing Chair/bed transfer assistive device: Programmer, multimedia   Ambulation assist      Assist level: Supervision/Verbal cueing Assistive device: Walker-rolling Max distance: 400   Walk 10 feet activity   Assist     Assist level: Supervision/Verbal cueing Assistive device: Walker-rolling   Walk 50 feet activity   Assist    Assist level: Supervision/Verbal cueing Assistive device: Walker-rolling    Walk 150 feet activity   Assist    Assist level: Supervision/Verbal cueing Assistive device: Walker-rolling    Walk 10 feet on uneven surface  activity   Assist Walk 10 feet on uneven surfaces activity did not occur: Safety/medical concerns   Assist level: Supervision/Verbal cueing Assistive device: Chemical engineer  Assist Will patient use wheelchair at discharge?: No Type of Wheelchair: Manual    Wheelchair assist level:  Supervision/Verbal cueing Max wheelchair distance: 150'    Wheelchair 50 feet with 2 turns activity    Assist        Assist Level: Supervision/Verbal cueing   Wheelchair 150 feet activity     Assist      Assist Level: Supervision/Verbal cueing   Blood pressure 118/74, pulse 71, temperature 97.8 F (36.6 C), temperature source Oral, resp. rate 19, height 5\' 9"  (1.753 m), weight 113.5 kg, SpO2 97 %.  Medical Problem List and Plan: 1.Decreased functional mobilitysecondary to degenerative disc disease with slight scoliosis and radiculopathy L3-4 and 4-5. Status post posterior instrumented fusionwith oblique exposureL3-L5 10/08/2019. Back brace with out of bed.Plan suture removal 2 weeks from day of surgery. 2. Antithrombotics: -DVT/anticoagulation:SCDs. Check vascular study (per Ortho, doesn't feel comfortable with additional anticoagulation) 10/28- Pt has new B/L gastroc DVTs- got OK from surgeon to put on Lovenox 40 mg daily- asked [place] to let them know if was therapeutic dosing; will recheck Vascular study in 1 week. -antiplatelet therapy: N/A.Patient on aspirin 81 mg daily prior to admission. Will discuss resuming with orthopedics Dr. Rolena Infante 3. Pain Management:Neurontin 300 mg 3 times daily,Norco,and Robaxin as needed  10/29- will increase Gabapentin for pt- to 600 mg BID x 3 days then increase again if required  10/31- better control 4. Mood:Provide emotional support -antipsychotic agents: N/A 5. Neuropsych: This patientiscapable of making decisions on hisown behalf. 6. Skin/Wound Care:Routine skin checks 7. Fluids/Electrolytes/Nutrition:Routine in and outs with follow-up chemistries 8. Diabetes mellitus. Hemoglobin A1c 7.3. SSI. Glucophage 500 mg 1 tablet breakfast and 2 tablets at supper prior to admission. Will con't to monitor and titrate to get better control of BGs. CBG (last 3)  Recent Labs    10/15/19 1723  10/15/19 2125 10/16/19 0641  GLUCAP 90 146* 154*  10/29- improved control 9. Hypertension.Ziac5-6.25 mg daily. Will monitor based on starting to mobilize  10/29- BP controlled      LOS: 4 days A FACE TO FACE EVALUATION WAS PERFORMED  Malina Geers 10/16/2019, 11:44 AM

## 2019-10-16 NOTE — Progress Notes (Signed)
Occupational Therapy Session Note  Patient Details  Name: Jaydenn Malina MRN: [id number] Date of Birth: 06/03/1956  Today's Date: 10/16/2019 OT Individual Time: [id number] OT Individual Time Calculation (min): 60 min    Short Term Goals: Week 1:  OT Short Term Goal 1 (Week 1): STG=LTG due to LOS  Skilled Therapeutic Interventions/Progress Updates: Upon offer for OT, patient stated, "I have done everything.   I really am ready to go home."   Still, he concurred to participate as follows:  Toilet transfer via walker= S Toileting= Cues to utilize the toilet paper toool in his bathoom to wrap tissue around to extend his reach for wiping his bottom to avoid twisting of waist.   Otherwise, patient maintained BAT back precations. Donned socks and shoes with reacher with extra time and crossed one leg over the other for donning shoes.  Standing endurance and sit to stand activities in prep for safety and increased independence with ALDs= supervision  Triceps 4-5 lbs to increase tricep strength and increase endurance for continued safe functional transfer and ability to complete sit to stand  Patient left with theraband in room to utilize extra endurance work   Continue OT Plan of care     Therapy Documentation Precautions:  Precautions Precautions: Back, Fall Precaution Comments: Pt recalls 3/3 precautions Required Braces or Orthoses: Spinal Brace Spinal Brace: Lumbar corset, Applied in sitting position Restrictions Weight Bearing Restrictions: No Pain:    Therapy/Group: Individual Therapy  Herschell Dimes 10/16/2019, 5:13 PM

## 2019-10-17 ENCOUNTER — Inpatient Hospital Stay (HOSPITAL_COMMUNITY): Payer: BC Managed Care – PPO

## 2019-10-17 LAB — GLUCOSE, CAPILLARY
Glucose-Capillary: 122 mg/dL — ABNORMAL HIGH (ref 70–99)
Glucose-Capillary: 123 mg/dL — ABNORMAL HIGH (ref 70–99)
Glucose-Capillary: 103 mg/dL — ABNORMAL HIGH (ref 70–99)
Glucose-Capillary: 125 mg/dL — ABNORMAL HIGH (ref 70–99)

## 2019-10-17 NOTE — Progress Notes (Signed)
Physical Therapy Session Note  Patient Details  Name: Aaron Burns MRN: [id number] Date of Birth: [birth year]  Today's Date: 10/17/2019 PT Individual Time: [id number] PT Individual Time Calculation (min): 30 min   Short Term Goals: Week 1:  PT Short Term Goal 1 (Week 1): =LTG due to ELOS  Skilled Therapeutic Interventions/Progress Updates:     Patient in recliner upon PT arrival. Patient alert and agreeable to PT session, requesting to stay in the room due to increased fatigue today due to poor sleep at night. Patient reported 2-3/10 low back pain during session. PT provided repositioning, rest breaks, and distraction as pain interventions throughout session. Lumbar brace donned prior to and throughout session.   Therapeutic Activity: Transfers: Patient performed sit to/from stand x5 with supervision for safety without an AD.   Neuromuscular Re-ed: Patient performed standing balance without UE support on level surface x2 min, B staggered stance x1 min, feet together x1 min, feet together eyes closed x1 min, B SLS 10 sec and 34 sec on R 11 sec and 28 sec on L.  Patient in recliner in room at end of session with breaks locked, chair alarm set, and all needs within reach. Educated on fall risk/prevention at home, injury prevention at home and with leisure activities, fitness promotion following recovery, and energy conservation throughout session during seated rest breaks between activities due to decreased standing tolerance secondary to back pain.   Therapy Documentation Precautions:  Precautions Precautions: Back, Fall Precaution Comments: Pt recalls 3/3 precautions Required Braces or Orthoses: Spinal Brace Spinal Brace: Lumbar corset, Applied in sitting position Restrictions Weight Bearing Restrictions: No   Therapy/Group: Individual Therapy  [name] L Tyniya Kuyper PT, DPT  10/17/2019, 3:55 PM

## 2019-10-17 NOTE — Plan of Care (Signed)
  Problem: Consults Goal: RH SPINAL CORD INJURY PATIENT EDUCATION Description:  See Patient Education module for education specifics.  Outcome: Progressing   Problem: RH SKIN INTEGRITY Goal: RH STG SKIN FREE OF INFECTION/BREAKDOWN Description: No new breakdown or infection with min assist  Outcome: Progressing Goal: RH STG ABLE TO PERFORM INCISION/WOUND CARE W/ASSISTANCE Description: STG Able To Perform Incision/Wound Care With min Assistance. Outcome: Progressing   Problem: RH PAIN MANAGEMENT Goal: RH STG PAIN MANAGED AT OR BELOW PT'S PAIN GOAL Description: < 3 out of 10.  Outcome: Progressing   Problem: RH KNOWLEDGE DEFICIT SCI Goal: RH STG INCREASE KNOWLEDGE OF SELF CARE AFTER SCI Description: Pt will be able to demonstrate proper care after SCI including back precautions, donning and doffing brace, and wound care with min assist/cues  Outcome: Progressing

## 2019-10-17 NOTE — Progress Notes (Signed)
[facility] PHYSICAL MEDICINE & REHABILITATION PROGRESS NOTE   Subjective/Complaints:  Pt reports wants to go home- is scheduled to leave Tuesday- agrees that's it's best to not leave earlier.  Otherwise, denies any issues.  ROS- denies SOB, CP N/V/D. Objective:   No results found. No results for input(s): WBC, HGB, HCT, PLT in the last 72 hours. No results for input(s): NA, K, CL, CO2, GLUCOSE, BUN, CREATININE, CALCIUM in the last 72 hours.  Intake/Output Summary (Last 24 hours) at 10/17/2019 1155 Last data filed at 10/17/2019 0725 Gross per 24 hour  Intake 1102 ml  Output -  Net 1102 ml     Physical Exam: Vital Signs Blood pressure 119/79, pulse (!) 57, temperature 97.7 F (36.5 C), temperature source Oral, resp. rate 20, height 5\' 9"  (1.753 m), weight 113.5 kg, SpO2 100 %.  Physical Exam Nursing note, and vitalsreviewed. Constitutional: He appearswell-developedand well-nourished.No distress. Sitting in bedside chair where he slept, NAD HENT:  Head:Normocephalicand atraumatic.  Nose:Nose normal.  Mouth/Throat:Oropharynx is clear and moist.  Eyes:conjugate gaze  Neck:Normal range of motion.Neck supple.No thyromegalypresent.  Cardiovascular: RRR Respiratory: CTA B/L- no wheezes, rales, or rhonchi GI: (+)BS, soft, NT, ND Musculoskeletal:  Comments: HFs 4+/5; otherwise 5/5 in UEs and LEs B/L Neurological: He is alert. Psychiatric: brighter affect Skin back and side-incisions look great- no drainage; no erythema seen   Assessment/Plan: 1. Functional deficits secondary to L3-L5 fusion which require 3+ hours per day of interdisciplinary therapy in a comprehensive inpatient rehab setting.  Physiatrist is providing close team supervision and 24 hour management of active medical problems listed below.  Physiatrist and rehab team continue to assess barriers to discharge/monitor patient progress toward functional and medical goals  Care  Tool:  Bathing    Body parts bathed by patient: Right arm, Left arm, Chest, Abdomen, Front perineal area, Right upper leg, Left upper leg, Face, Buttocks, Right lower leg, Left lower leg   Body parts bathed by helper: Buttocks, Right lower leg, Left lower leg     Bathing assist Assist Level: Contact Guard/Touching assist     Upper Body Dressing/Undressing Upper body dressing   What is the patient wearing?: Pull over shirt, Orthosis Orthosis activity level: Performed by patient  Upper body assist Assist Level: Independent    Lower Body Dressing/Undressing Lower body dressing      What is the patient wearing?: Underwear/pull up, Pants     Lower body assist Assist for lower body dressing: Supervision/Verbal cueing(Reacher)     Toileting Toileting    Toileting assist Assist for toileting: Minimal Assistance - Patient > 75% Assistive Device Comment: rolling walker   Transfers Chair/bed transfer  Transfers assist     Chair/bed transfer assist level: Supervision/Verbal cueing Chair/bed transfer assistive device: Programmer, multimedia   Ambulation assist      Assist level: Supervision/Verbal cueing Assistive device: Walker-rolling Max distance: 400   Walk 10 feet activity   Assist     Assist level: Supervision/Verbal cueing Assistive device: Walker-rolling   Walk 50 feet activity   Assist    Assist level: Supervision/Verbal cueing Assistive device: Walker-rolling    Walk 150 feet activity   Assist    Assist level: Supervision/Verbal cueing Assistive device: Walker-rolling    Walk 10 feet on uneven surface  activity   Assist Walk 10 feet on uneven surfaces activity did not occur: Safety/medical concerns   Assist level: Supervision/Verbal cueing Assistive device: Aeronautical engineer Will patient  use wheelchair at discharge?: No Type of Wheelchair: Manual    Wheelchair assist level:  Supervision/Verbal cueing Max wheelchair distance: 150'    Wheelchair 50 feet with 2 turns activity    Assist        Assist Level: Supervision/Verbal cueing   Wheelchair 150 feet activity     Assist      Assist Level: Supervision/Verbal cueing   Blood pressure 119/79, pulse (!) 57, temperature 97.7 F (36.5 C), temperature source Oral, resp. rate 20, height 5\' 9"  (1.753 m), weight 113.5 kg, SpO2 100 %.  Medical Problem List and Plan: 1.Decreased functional mobilitysecondary to degenerative disc disease with slight scoliosis and radiculopathy L3-4 and 4-5. Status post posterior instrumented fusionwith oblique exposureL3-L5 10/08/2019. Back brace with out of bed.Plan suture removal 2 weeks from day of surgery. 2. Antithrombotics: -DVT/anticoagulation:SCDs. Check vascular study (per Ortho, doesn't feel comfortable with additional anticoagulation) 10/28- Pt has new B/L gastroc DVTs- got OK from surgeon to put on Lovenox 40 mg daily- asked [place] to let them know if was therapeutic dosing; will recheck Vascular study in 1 week.  11/1- pt leaving before 1 week, so will order Dopplers as of 11/2 -antiplatelet therapy: N/A.Patient on aspirin 81 mg daily prior to admission. Will discuss resuming with orthopedics Dr. Rolena Infante 3. Pain Management:Neurontin 300 mg 3 times daily,Norco,and Robaxin as needed  10/29- will increase Gabapentin for pt- to 600 mg BID x 3 days then increase again if required  10/31- better control 4. Mood:Provide emotional support -antipsychotic agents: N/A 5. Neuropsych: This patientiscapable of making decisions on hisown behalf. 6. Skin/Wound Care:Routine skin checks 7. Fluids/Electrolytes/Nutrition:Routine in and outs with follow-up chemistries 8. Diabetes mellitus. Hemoglobin A1c 7.3. SSI. Glucophage 500 mg 1 tablet breakfast and 2 tablets at supper prior to admission. Will con't to monitor and titrate to get  better control of BGs. CBG (last 3)  Recent Labs    10/16/19 2113 10/17/19 0611 10/17/19 1144  GLUCAP 164* 122* 123*  11/1- adequate control 9. Hypertension.Ziac5-6.25 mg daily. Will monitor based on starting to mobilize  11/1- BP controlled      LOS: 5 days A FACE TO FACE EVALUATION WAS PERFORMED  Aaron Burns 10/17/2019, 11:55 AM

## 2019-10-18 ENCOUNTER — Inpatient Hospital Stay (HOSPITAL_COMMUNITY): Payer: BC Managed Care – PPO | Admitting: Physical Therapy

## 2019-10-18 ENCOUNTER — Inpatient Hospital Stay (HOSPITAL_COMMUNITY): Payer: BC Managed Care – PPO | Admitting: Occupational Therapy

## 2019-10-18 ENCOUNTER — Encounter (HOSPITAL_COMMUNITY): Payer: Self-pay | Admitting: Orthopedic Surgery

## 2019-10-18 ENCOUNTER — Ambulatory Visit: Payer: BC Managed Care – PPO | Admitting: Physician Assistant

## 2019-10-18 ENCOUNTER — Ambulatory Visit (HOSPITAL_COMMUNITY): Payer: BC Managed Care – PPO | Attending: Physical Medicine and Rehabilitation

## 2019-10-18 DIAGNOSIS — I82409 Acute embolism and thrombosis of unspecified deep veins of unspecified lower extremity: Secondary | ICD-10-CM | POA: Diagnosis not present

## 2019-10-18 DIAGNOSIS — Z86718 Personal history of other venous thrombosis and embolism: Secondary | ICD-10-CM | POA: Diagnosis not present

## 2019-10-18 DIAGNOSIS — R936 Abnormal findings on diagnostic imaging of limbs: Secondary | ICD-10-CM | POA: Diagnosis not present

## 2019-10-18 LAB — CBC WITH DIFFERENTIAL/PLATELET
Abs Immature Granulocytes: 0.09 10*3/uL — ABNORMAL HIGH (ref 0.00–0.07)
Basophils Absolute: 0.1 10*3/uL (ref 0.0–0.1)
Basophils Relative: 1 %
Eosinophils Absolute: 0.2 10*3/uL (ref 0.0–0.5)
Eosinophils Relative: 2 %
HCT: 39.9 % (ref 39.0–52.0)
Hemoglobin: 13 g/dL (ref 13.0–17.0)
Immature Granulocytes: 1 %
Lymphocytes Relative: 25 %
Lymphs Abs: 2.3 10*3/uL (ref 0.7–4.0)
MCH: 30 pg (ref 26.0–34.0)
MCHC: 32.6 g/dL (ref 30.0–36.0)
MCV: 92.1 fL (ref 80.0–100.0)
Monocytes Absolute: 1 10*3/uL (ref 0.1–1.0)
Monocytes Relative: 12 %
Neutro Abs: 5.4 10*3/uL (ref 1.7–7.7)
Neutrophils Relative %: 59 %
Platelets: 293 10*3/uL (ref 150–400)
RBC: 4.33 MIL/uL (ref 4.22–5.81)
RDW: 13.2 % (ref 11.5–15.5)
WBC: 9.1 10*3/uL (ref 4.0–10.5)
nRBC: 0 % (ref 0.0–0.2)

## 2019-10-18 LAB — GLUCOSE, CAPILLARY
Glucose-Capillary: 168 mg/dL — ABNORMAL HIGH (ref 70–99)
Glucose-Capillary: 116 mg/dL — ABNORMAL HIGH (ref 70–99)
Glucose-Capillary: 89 mg/dL (ref 70–99)
Glucose-Capillary: 126 mg/dL — ABNORMAL HIGH (ref 70–99)

## 2019-10-18 LAB — BASIC METABOLIC PANEL
Anion gap: 7 (ref 5–15)
BUN: 20 mg/dL (ref 8–23)
CO2: 31 mmol/L (ref 22–32)
Calcium: 9.5 mg/dL (ref 8.9–10.3)
Chloride: 102 mmol/L (ref 98–111)
Creatinine, Ser: 0.95 mg/dL (ref 0.61–1.24)
GFR calc Af Amer: >60 mL/min (ref >60)
GFR calc non Af Amer: >60 mL/min (ref >60)
Glucose, Bld: 139 mg/dL — ABNORMAL HIGH (ref 70–99)
Potassium: 4.7 mmol/L (ref 3.5–5.1)
Sodium: 140 mmol/L (ref 135–145)

## 2019-10-18 MED ORDER — RIVAROXABAN 20 MG PO TABS: 20.0000 mg | ORAL_TABLET | Freq: Every day | ORAL | Status: DC

## 2019-10-18 MED ORDER — BISOPROLOL-HYDROCHLOROTHIAZIDE 5-6.25 MG PO TABS: ORAL_TABLET | ORAL | 1 refills | Status: DC

## 2019-10-18 MED ORDER — MELATONIN 3 MG PO TABS: 3.0000 mg | ORAL_TABLET | Freq: Every day | ORAL | 0 refills | Status: DC

## 2019-10-18 MED ORDER — RIVAROXABAN 20 MG PO TABS: 20.0000 mg | ORAL_TABLET | Freq: Every day | ORAL | 1 refills | Status: DC

## 2019-10-18 MED ORDER — RIVAROXABAN 15 MG PO TABS: 15.0000 mg | ORAL_TABLET | Freq: Two times a day (BID) | ORAL | 0 refills | Status: DC

## 2019-10-18 MED ORDER — HYDROCODONE-ACETAMINOPHEN 5-325 MG PO TABS: 1.0000 | ORAL_TABLET | ORAL | 0 refills | Status: DC | PRN

## 2019-10-18 MED ORDER — GABAPENTIN 300 MG PO CAPS: 600.0000 mg | ORAL_CAPSULE | Freq: Two times a day (BID) | ORAL | 1 refills | Status: DC

## 2019-10-18 MED ORDER — METHOCARBAMOL 500 MG PO TABS: 500.0000 mg | ORAL_TABLET | Freq: Four times a day (QID) | ORAL | 0 refills | Status: DC | PRN

## 2019-10-18 MED ORDER — ACETAMINOPHEN 325 MG PO TABS: 650.0000 mg | ORAL_TABLET | ORAL | Status: AC | PRN

## 2019-10-18 MED ORDER — RIVAROXABAN 15 MG PO TABS
15.0000 mg | ORAL_TABLET | Freq: Two times a day (BID) | ORAL | Status: DC
  Administered 2019-10-18 – 2019-10-19 (×3): 15 mg via ORAL
  Filled 2019-10-18 (×3): qty 1

## 2019-10-18 NOTE — Progress Notes (Signed)
Physical Therapy Session Note  Patient Details  Name: Aaron Burns MRN: [id number] Date of Birth: August 18, 1956  Today's Date: 10/18/2019 PT Individual Time: 1000-1100; 1425-1525 PT Individual Time Calculation (min): 60 min and 60 min  Short Term Goals: Week 1:  PT Short Term Goal 1 (Week 1): =LTG due to ELOS  Skilled Therapeutic Interventions/Progress Updates:    Session 1: Pt received seated on toilet, agreeable to PT session. No complaints of pain this AM. Pt is mod I for all transfers with RW this session. Ambulation x 150 ft with RW at mod I level, flexed trunk and decreased B step length. Car transfer at mod I level. Ascend/descend one 4" curb with RW at mod I level to simulate home environment. Ascend/descend 12 stairs with 2 handrails at mod I level, alternating pattern. Nustep level 3 x 10 min with use of B UE/LE for global endurance training. Pt left seated in recliner in room with needs in reach at end of session.  Session 2: Pt received seated in recliner in room, agreeable to PT session. No complaints of pain. Pt transfers at mod I level with RW. Ambulation 2 x 200 ft with RW mod I. Standing BLE strengthening therex x 10 reps: marches, hip abd, hip ext, HS curls, mini-squats, heel/toe raises. Provided handout of HEP for standing therex. Dynamic standing balance with RW: amb with RW through cones at mod I level; sidesteps L/R with RW at mod I level; forward/backward gait with RW 3 x 10 ft with Supervision. Static standing balance while preparing a cup of coffee with no UE support at mod I level. Pt is safe to d/c home tomorrow at mod I level overall with use of RW. Pt left seated in recliner in room with needs in reach at end of session.   Therapy Documentation Precautions:  Precautions Precautions: Back, Fall Precaution Comments: Pt recalls 3/3 precautions Required Braces or Orthoses: Spinal Brace Spinal Brace: Lumbar corset, Applied in sitting position Restrictions Weight Bearing  Restrictions: No   Therapy/Group: Individual Therapy   Excell Seltzer, PT, DPT  10/18/2019, 12:13 PM

## 2019-10-18 NOTE — Progress Notes (Signed)
Occupational Therapy Discharge Summary  Patient Details  Name: Aaron Burns MRN: [id number] Date of Birth: 12-Nov-1956   Patient has met 9 of 9 long term goals due to improved activity tolerance, improved balance, postural control, ability to compensate for deficits and improved coordination.  Patient to discharge at overall Modified Independent level.  Patient's care partner is independent to provide the necessary physical assistance at discharge. Pt to d/c home at mod I level using RW. Recommend bathing from seated level in shower. Formal caregiver education not completed, however, pt reports wife is home 24 hrs and will be able to provide PRN assist and assist for IADLs at d/c.   Recommendation:  Patient with no further OT needs  Equipment: No equipment provided  Reasons for discharge: treatment goals met and discharge from hospital  Patient/family agrees with progress made and goals achieved: Yes  OT Discharge Precautions/Restrictions  Precautions Precautions: Back;Fall Required Braces or Orthoses: Spinal Brace Spinal Brace: Lumbar corset;Applied in sitting position Restrictions Weight Bearing Restrictions: No Vision Baseline Vision/History: Wears glasses Wears Glasses: Reading only Patient Visual Report: No change from baseline Vision Assessment?: No apparent visual deficits Perception  Perception: Within Functional Limits Praxis Praxis: Intact Cognition Overall Cognitive Status: Within Functional Limits for tasks assessed Arousal/Alertness: Awake/alert Orientation Level: Oriented X4 Focused Attention: Appears intact Memory: Appears intact Safety/Judgment: Appears intact Sensation Sensation Light Touch: Appears Intact Proprioception: Appears Intact Coordination Gross Motor Movements are Fluid and Coordinated: No Fine Motor Movements are Fluid and Coordinated: Yes Motor  Motor Motor: Within Functional Limits Trunk/Postural Assessment  Cervical Assessment Cervical  Assessment: Within Functional Limits Thoracic Assessment Thoracic Assessment: Exceptions to WFL(Back pre-cautions) Lumbar Assessment Lumbar Assessment: Exceptions to WFL(Back pre-cautions) Postural Control Postural Control: Within Functional Limits  Balance Balance Balance Assessed: Yes Static Sitting Balance Static Sitting - Balance Support: Feet supported Static Sitting - Level of Assistance: 6: Modified independent (Device/Increase time) Dynamic Sitting Balance Dynamic Sitting - Balance Support: No upper extremity supported;Feet supported;During functional activity Dynamic Sitting - Level of Assistance: 6: Modified independent (Device/Increase time) Static Standing Balance Static Standing - Balance Support: During functional activity;No upper extremity supported Static Standing - Level of Assistance: 6: Modified independent (Device/Increase time) Dynamic Standing Balance Dynamic Standing - Balance Support: During functional activity;Right upper extremity supported;Left upper extremity supported Dynamic Standing - Level of Assistance: 6: Modified independent (Device/Increase time) Extremity/Trunk Assessment RUE Assessment RUE Assessment: Within Functional Limits LUE Assessment LUE Assessment: Within Functional Limits   Danial Sisley L 10/18/2019, 7:26 AM

## 2019-10-18 NOTE — Progress Notes (Signed)
Lower extremity venous has been completed.   Preliminary results in CV Proc.   Abram Sander 10/18/2019 4:40 PM

## 2019-10-18 NOTE — Progress Notes (Signed)
Physical Therapy Discharge Summary  Patient Details  Name: Aaron Burns MRN: [id number] Date of Birth: [birth year]  Today's Date: 10/18/2019   Patient has met 6 of 6 long term goals due to improved activity tolerance, improved balance, improved postural control, increased strength, decreased pain and ability to compensate for deficits.  Patient to discharge at an ambulatory level Modified Independent.   Patient's care partner is not necessary to provide physical assistance at discharge. Pt is at mod I level for all transfers and mobility with RW.  Reasons goals not met: Patient has met all rehab goals.  Recommendation:  Patient will benefit from ongoing skilled PT services in outpatient setting to continue to advance safe functional mobility, address ongoing impairments in endurance, strength, balance, safety, and minimize fall risk.  Equipment: No equipment provided. Pt already owns RW.  Reasons for discharge: treatment goals met and discharge from hospital  Patient/family agrees with progress made and goals achieved: Yes    PT Discharge Precautions/Restrictions Precautions Precautions: Back;Fall Required Braces or Orthoses: Spinal Brace Spinal Brace: Lumbar corset;Applied in sitting position Restrictions Weight Bearing Restrictions: No Vision/Perception  Perception Perception: Within Functional Limits Praxis Praxis: Intact  Cognition Overall Cognitive Status: Within Functional Limits for tasks assessed Arousal/Alertness: Awake/alert Orientation Level: Oriented X4 Attention: Focused Focused Attention: Appears intact Memory: Appears intact Awareness: Appears intact Problem Solving: Appears intact Safety/Judgment: Appears intact Sensation Sensation Light Touch: Appears Intact Proprioception: Appears Intact Coordination Gross Motor Movements are Fluid and Coordinated: No Fine Motor Movements are Fluid and Coordinated: Yes Coordination and Movement Description: Impaired  2/2 generalized weakness and pain Motor  Motor Motor: Within Functional Limits Motor - Skilled Clinical Observations: Generalized weakness/deconditioning Motor - Discharge Observations: generalized weakness/deconditioning  Mobility Bed Mobility Bed Mobility: Rolling Right;Rolling Left;Supine to Sit;Sit to Supine Rolling Right: Independent with assistive device Rolling Left: Independent with assistive device Supine to Sit: Independent with assistive device Sit to Supine: Independent with assistive device Transfers Transfers: Sit to Stand;Stand Pivot Transfers Sit to Stand: Independent with assistive device Stand Pivot Transfers: Independent with assistive device Transfer (Assistive device): Rolling walker Locomotion  Gait Ambulation: Yes Gait Assistance: Independent with assistive device Gait Distance (Feet): 150 Feet Assistive device: Rolling walker Gait Gait: Yes Gait Pattern: Impaired Gait Pattern: Decreased step length - right;Decreased step length - left;Trunk flexed Gait velocity: decreased High Level Ambulation High Level Ambulation: Side stepping Side Stepping: with RW and Supervision Stairs / Additional Locomotion Stairs: Yes Stairs Assistance: Independent with assistive device Stair Management Technique: Two rails;Alternating pattern Number of Stairs: 12 Height of Stairs: 6 Curb: Independent with assistive device Wheelchair Mobility Wheelchair Mobility: No  Trunk/Postural Assessment  Cervical Assessment Cervical Assessment: Within Functional Limits Thoracic Assessment Thoracic Assessment: Exceptions to WFL(back precautions) Lumbar Assessment Lumbar Assessment: Exceptions to WFL(back precautions) Postural Control Postural Control: Within Functional Limits  Balance Balance Balance Assessed: Yes Static Sitting Balance Static Sitting - Balance Support: No upper extremity supported;Feet supported Static Sitting - Level of Assistance: 6: Modified independent  (Device/Increase time) Dynamic Sitting Balance Dynamic Sitting - Balance Support: No upper extremity supported;Feet supported;During functional activity Dynamic Sitting - Level of Assistance: 6: Modified independent (Device/Increase time) Static Standing Balance Static Standing - Balance Support: Bilateral upper extremity supported;During functional activity Static Standing - Level of Assistance: 6: Modified independent (Device/Increase time) Dynamic Standing Balance Dynamic Standing - Balance Support: Bilateral upper extremity supported;During functional activity Dynamic Standing - Level of Assistance: 6: Modified independent (Device/Increase time) Extremity Assessment   RLE Assessment RLE Assessment: Within Functional  Limits Passive Range of Motion (PROM) Comments: tight HS General Strength Comments: see below RLE Strength Right Hip Flexion: 3/5 Right Knee Flexion: 4/5 Right Knee Extension: 4/5 Right Ankle Dorsiflexion: 4/5 LLE Assessment LLE Assessment: Within Functional Limits Passive Range of Motion (PROM) Comments: tight HS General Strength Comments: see below LLE Strength Left Hip Flexion: 4/5 Left Knee Flexion: 4/5 Left Knee Extension: 4/5 Left Ankle Dorsiflexion: 4/5     Excell Seltzer, PT, DPT 10/18/2019, 10:41 AM

## 2019-10-18 NOTE — Progress Notes (Signed)
[facility] PHYSICAL MEDICINE & REHABILITATION PROGRESS NOTE   Subjective/Complaints:  Aaron Burns reports that he is feeling well this morning. Denies pain. He is very happy that he is able to walk to the bathroom and use the commode independently. He had a BM this morning.  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  Will be discharged tomorrow.  Labs stable today.   ROS- denies SOB, CP N/V/D. Objective:   No results found. Recent Labs    10/18/19 0459  WBC 9.1  HGB 13.0  HCT 39.9  PLT 293   Recent Labs    10/18/19 0459  NA 140  K 4.7  CL 102  CO2 31  GLUCOSE 139*  BUN 20  CREATININE 0.95  CALCIUM 9.5    Intake/Output Summary (Last 24 hours) at 10/18/2019 1047 Last data filed at 10/18/2019 [id number] Gross per 24 hour  Intake 600 ml  Output -  Net 600 ml     Physical Exam: Vital Signs Blood pressure 129/73, pulse 66, temperature 97.7 F (36.5 C), temperature source Oral, resp. rate 18, height 5\' 9"  (1.753 m), weight 113.5 kg, SpO2 98 %.  Physical Exam Nursing note, and vitalsreviewed. Constitutional: He appearswell-developedand well-nourished.No distress. Sitting in bedside chair, NAD HENT:  Head:Normocephalicand atraumatic.  Nose:Nose normal.  Mouth/Throat:Oropharynx is clear and moist.  Eyes:conjugate gaze  Neck:Normal range of motion.Neck supple.No thyromegalypresent.  Cardiovascular: RRR Respiratory: CTA B/L- no wheezes, rales, or rhonchi GI: (+)BS, soft, NT, ND Musculoskeletal:  Comments: HFs 4+/5; otherwise 5/5 in UEs and LEs B/L Neurological: He is alert. Psychiatric: brighter affect Skin back and side-incisions look great- no drainage; no erythema seen   Assessment/Plan: 1. Functional deficits secondary to L3-L5 fusion which require 3+ hours per day of interdisciplinary therapy in a comprehensive inpatient rehab setting.  Physiatrist is providing close team supervision and 24 hour management of active  medical problems listed below.  Physiatrist and rehab team continue to assess barriers to discharge/monitor patient progress toward functional and medical goals  Care Tool:  Bathing    Body parts bathed by patient: Right arm, Left arm, Chest, Abdomen, Front perineal area, Right upper leg, Left upper leg, Face, Buttocks, Right lower leg, Left lower leg   Body parts bathed by helper: Buttocks, Right lower leg, Left lower leg     Bathing assist Assist Level: Independent with assistive device Assistive Device Comment: Seated   Upper Body Dressing/Undressing Upper body dressing   What is the patient wearing?: Pull over shirt, Orthosis Orthosis activity level: Performed by patient  Upper body assist Assist Level: Independent    Lower Body Dressing/Undressing Lower body dressing      What is the patient wearing?: Underwear/pull up, Pants     Lower body assist Assist for lower body dressing: Independent with assitive device Assistive Device Comment: RW   Toileting Toileting    Toileting assist Assist for toileting: Independent with assistive device Assistive Device Comment: rolling walker   Transfers Chair/bed transfer  Transfers assist     Chair/bed transfer assist level: Independent with assistive device Chair/bed transfer assistive device: Museum/gallery exhibitions officer assist      Assist level: Supervision/Verbal cueing Assistive device: Walker-rolling Max distance: 400   Walk 10 feet activity   Assist     Assist level: Supervision/Verbal cueing Assistive device: Walker-rolling   Walk 50 feet activity   Assist    Assist level: Supervision/Verbal cueing Assistive device: Walker-rolling    Walk 150 feet activity  Assist    Assist level: Supervision/Verbal cueing Assistive device: Walker-rolling    Walk 10 feet on uneven surface  activity   Assist Walk 10 feet on uneven surfaces activity did not occur: Safety/medical  concerns   Assist level: Supervision/Verbal cueing Assistive device: Aeronautical engineer Will patient use wheelchair at discharge?: No Type of Wheelchair: Manual    Wheelchair assist level: Supervision/Verbal cueing Max wheelchair distance: 150'    Wheelchair 50 feet with 2 turns activity    Assist        Assist Level: Supervision/Verbal cueing   Wheelchair 150 feet activity     Assist      Assist Level: Supervision/Verbal cueing   Blood pressure 129/73, pulse 66, temperature 97.7 F (36.5 C), temperature source Oral, resp. rate 18, height 5\' 9"  (1.753 m), weight 113.5 kg, SpO2 98 %.  Medical Problem List and Plan: 1.Decreased functional mobilitysecondary to degenerative disc disease with slight scoliosis and radiculopathy L3-4 and 4-5. Status post posterior instrumented fusionwith oblique exposureL3-L5 10/08/2019. Back brace with out of bed.Plan suture removal 2 weeks from day of surgery. 2. Antithrombotics: -DVT/anticoagulation:SCDs. Check vascular study (per Ortho, doesn't feel comfortable with additional anticoagulation) 10/28- Pt has new B/L gastroc DVTs- got OK from surgeon to put on Lovenox 40 mg daily- asked [place] to let them know if was therapeutic dosing; will recheck Vascular study in 1 week.  11/1- pt leaving before 1 week, so will order Dopplers as of 11/2 -antiplatelet therapy: N/A.Patient on aspirin 81 mg daily prior to admission. Will discuss resuming with orthopedics Dr. Rolena Infante  11/2 Surgeon started Mr. Beichler on Xarelto today and he will follow up with surgeon this week.  3. Pain Management:Neurontin 300 mg 3 times daily,Norco,and Robaxin as needed  10/29- will increase Gabapentin for pt- to 600 mg BID x 3 days then increase again if required  10/31- better control 4. Mood:Provide emotional support -antipsychotic agents: N/A 5. Neuropsych: This patientiscapable of making decisions  on hisown behalf. 6. Skin/Wound Care:Routine skin checks 7. Fluids/Electrolytes/Nutrition:Routine in and outs with follow-up chemistries 8. Diabetes mellitus. Hemoglobin A1c 7.3. SSI. Glucophage 500 mg 1 tablet breakfast and 2 tablets at supper prior to admission. Will con't to monitor and titrate to get better control of BGs. CBG (last 3)  Recent Labs    10/17/19 1655 10/17/19 2107 10/18/19 0731  GLUCAP 103* 125* 168*  11/1- adequate control 9. Hypertension.Ziac5-6.25 mg daily. Will monitor based on starting to mobilize  11/1- BP controlled      LOS: 6 days A FACE TO FACE EVALUATION WAS PERFORMED  Clide Deutscher Paulkar 10/18/2019, 10:47 AM

## 2019-10-18 NOTE — Progress Notes (Signed)
Spoke with Dr. 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  Dr. Rolena Infante requested patient start Xarelto today and he will continue to follow at the office.

## 2019-10-18 NOTE — Progress Notes (Signed)
ANTICOAGULATION CONSULT NOTE - Initial Consult  Pharmacy Consult for Rivaroxaban Indication: DVT  Allergies  Allergen Reactions  . Ppd [Tuberculin Purified Protein Derivative]     Pt states he is not allergic to this.Marland Kitchenpositive tb test 2014, pt took tb treatment 2014    Patient Measurements: Height: 5\' 9"  (175.3 cm) Weight: 250 lb 3.6 oz (113.5 kg) IBW/kg (Calculated) : 70.7  Vital Signs: Temp: 97.7 F (36.5 C) (11/02 0404) Temp Source: Oral (11/02 0404) BP: 129/73 (11/02 0404) Pulse Rate: 66 (11/02 0404)  Labs: Recent Labs    10/18/19 0459  HGB 13.0  HCT 39.9  PLT 293  CREATININE 0.95    Estimated Creatinine Clearance: 97.6 mL/min (by C-G formula based on SCr of 0.95 mg/dL).   Medical History: Past Medical History:  Diagnosis Date  . Diabetes mellitus, type 2 ([place])   . Elevated hemoglobin A1c   . Heart murmur    at birth  . History of kidney stones 2011  . Hypertension   . Low back pain    Radiates to left ankle.  Injured after falling on back.  . Morbid obesity (BMI 36. 06/07/2015  . 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 Renal disorder   . 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 Patient on enoxaparin 40mg  SQ daily. Team wishes to start rivaroxaban for VTE treatment. Renal function stable.    Goal of Therapy:  Prevention of clot extension and new clots Monitor platelets by anticoagulation protocol: Yes   Plan:  Rivaroxaban 15mg  PO BID x 3 weeks, then 20mg  daily Patient to be educated prior to discharge.  Monitor for bleeding  Jo-Ann Johanning A. Levada Dy, PharmD, BCPS, FNKF Clinical Pharmacist [place] Please utilize Amion for appropriate phone number to reach the unit pharmacist ([place])    10/18/2019,8:58 AM

## 2019-10-18 NOTE — Discharge Instructions (Addendum)
Inpatient Rehab Discharge Instructions  Aaron Burns Discharge date and time: No discharge date for patient encounter.   Activities/Precautions/ Functional Status: Activity: Back brace when out of bed Diet: diabetic diet Wound Care: keep wound clean and dry Functional status:  ___ No restrictions     ___ Walk up steps independently ___ 24/7 supervision/assistance   ___ Walk up steps with assistance ___ Intermittent supervision/assistance  ___ Bathe/dress independently ___ Walk with walker     _x__ Bathe/dress with assistance ___ Walk Independently    ___ Shower independently ___ Walk with assistance    ___ Shower with assistance ___ No alcohol     ___ Return to work/school ________  Special Instructions: No driving smoking or alcohol  Follow up Dr Rolena Infante for suture removal and for referral to begin physical therapy.   My questions have been answered and I understand these instructions. I will adhere to these goals and the provided educational materials after my discharge from the hospital.  Patient/Caregiver Signature _______________________________ Date __________  Clinician Signature _______________________________________ Date __________  Please bring this form and your medication list with you to all your follow-up doctor's appointments.   Information on my medicine - XARELTO (rivaroxaban)  This medication education was reviewed with me or my healthcare representative as part of my discharge preparation.   WHY WAS XARELTO PRESCRIBED FOR YOU? Xarelto was prescribed to treat blood clots that may have been found in the veins of your legs (deep vein thrombosis) or in your lungs (pulmonary embolism) and to reduce the risk of them occurring again.  What do you need to know about Xarelto? The starting dose is one 15 mg tablet taken TWICE daily with food for the FIRST 21 DAYS then on 11/08/19  the dose is changed to one 20 mg tablet taken ONCE A DAY with your evening meal.  DO  NOT stop taking Xarelto without talking to the health care provider who prescribed the medication.  Refill your prescription for 20 mg tablets before you run out.  After discharge, you should have regular check-up appointments with your healthcare provider that is prescribing your Xarelto.  In the future your dose may need to be changed if your kidney function changes by a significant amount.  What do you do if you miss a dose? If you are taking Xarelto TWICE DAILY and you miss a dose, take it as soon as you remember. You may take two 15 mg tablets (total 30 mg) at the same time then resume your regularly scheduled 15 mg twice daily the next day.  If you are taking Xarelto ONCE DAILY and you miss a dose, take it as soon as you remember on the same day then continue your regularly scheduled once daily regimen the next day. Do not take two doses of Xarelto at the same time.   Important Safety Information Xarelto is a blood thinner medicine that can cause bleeding. You should call your healthcare provider right away if you experience any of the following: ? Bleeding from an injury or your nose that does not stop. ? Unusual colored urine (red or dark brown) or unusual colored stools (red or black). ? Unusual bruising for unknown reasons. ? A serious fall or if you hit your head (even if there is no bleeding).  Some medicines may interact with Xarelto and might increase your risk of bleeding while on Xarelto. To help avoid this, consult your healthcare provider or pharmacist prior to using any new prescription or non-prescription medications,  including herbals, vitamins, non-steroidal anti-inflammatory drugs (NSAIDs) and supplements.  This website has more information on Xarelto: [email or web address]

## 2019-10-18 NOTE — Discharge Summary (Signed)
Physician Discharge Summary  Patient ID: Aaron Burns MRN: [id number] DOB/AGE: Apr 21, 1956 63 y.o.  Admit date: 10/12/2019 Discharge date: 10/19/2019  Discharge Diagnoses:  Principal Problem:   Lumbar radiculopathy Active Problems:   Hypertension   Type 2 diabetes mellitus (HCC)   Fusion of lumbosacral spine Bilateral gastrocnemius DVTs Pain management  Discharged Condition: Stable  Significant Diagnostic Studies: Dg Chest 2 View  Result Date: 10/04/2019 CLINICAL DATA:  Preop back surgery. EXAM: CHEST - 2 VIEW COMPARISON:  12/19/2015 FINDINGS: The cardiomediastinal silhouette is within normal limits. The lungs are well inflated and clear. There is no evidence of pleural effusion or pneumothorax. No acute osseous abnormality is identified. IMPRESSION: No active cardiopulmonary disease. Electronically Signed   By: Logan Bores M.D.   On: 10/04/2019 09:26   Dg Lumbar Spine 2-3 Views  Result Date: 10/08/2019 CLINICAL DATA:  L3-L5 fusion. EXAM: LUMBAR SPINE - 2-3 VIEW; DG C-ARM 1-60 MIN COMPARISON:  Intraoperative x-rays from yesterday. MRI lumbar spine dated June 18, 2018. FLUOROSCOPY TIME:  6 minutes, 13 seconds. C-arm fluoroscopic images were obtained intraoperatively and submitted for post operative interpretation. FINDINGS: Frontal and lateral intraoperative fluoroscopic images demonstrate interval L3-L5 posterior fusion. Unchanged interbody grafts at L3-L4 and L4-L5. No acute abnormality. IMPRESSION: Intraoperative fluoroscopic guidance for L3-L5 posterior fusion. Electronically Signed   By: Titus Dubin M.D.   On: 10/08/2019 10:30   Dg Lumbar Spine 2-3 Views  Result Date: 10/07/2019 CLINICAL DATA:  Intraoperative imaging for lumbar surgery. EXAM: DG C-ARM 1-60 MIN; LUMBAR SPINE - 2-3 VIEW COMPARISON:  MRI lumbar spine 06/18/2018. FINDINGS: Two fluoroscopic spot views of the lower lumbar spine are provided and demonstrate interbody spacers in place at L3-4 and L4-5. No acute finding.  IMPRESSION: Intraoperative imaging for L3-4 and L4-5 discectomy. Electronically Signed   By: Inge Rise M.D.   On: 10/07/2019 12:46   Dg C-arm 1-60 Min  Result Date: 10/08/2019 CLINICAL DATA:  L3-L5 fusion. EXAM: LUMBAR SPINE - 2-3 VIEW; DG C-ARM 1-60 MIN COMPARISON:  Intraoperative x-rays from yesterday. MRI lumbar spine dated June 18, 2018. FLUOROSCOPY TIME:  6 minutes, 13 seconds. C-arm fluoroscopic images were obtained intraoperatively and submitted for post operative interpretation. FINDINGS: Frontal and lateral intraoperative fluoroscopic images demonstrate interval L3-L5 posterior fusion. Unchanged interbody grafts at L3-L4 and L4-L5. No acute abnormality. IMPRESSION: Intraoperative fluoroscopic guidance for L3-L5 posterior fusion. Electronically Signed   By: Titus Dubin M.D.   On: 10/08/2019 10:30   Dg C-arm 1-60 Min  Result Date: 10/07/2019 CLINICAL DATA:  Intraoperative imaging for lumbar surgery. EXAM: DG C-ARM 1-60 MIN; LUMBAR SPINE - 2-3 VIEW COMPARISON:  MRI lumbar spine 06/18/2018. FINDINGS: Two fluoroscopic spot views of the lower lumbar spine are provided and demonstrate interbody spacers in place at L3-4 and L4-5. No acute finding. IMPRESSION: Intraoperative imaging for L3-4 and L4-5 discectomy. Electronically Signed   By: Inge Rise M.D.   On: 10/07/2019 12:46   Vas [place] Lower Extremity Venous (dvt)  Result Date: 10/13/2019  Lower Venous Study Indications: Status post lumbar fusion.  Comparison Study: No prior study on file for comparison Performing Technologist: Sharion Dove RVS  Examination Guidelines: A complete evaluation includes B-mode imaging, spectral Doppler, color Doppler, and power Doppler as needed of all accessible portions of each vessel. Bilateral testing is considered an integral part of a complete examination. Limited examinations for reoccurring indications may be performed as noted.   +---------+---------------+---------+-----------+----------+--------------+ RIGHT    CompressibilityPhasicitySpontaneityPropertiesThrombus Aging +---------+---------------+---------+-----------+----------+--------------+ CFV      Full  Yes      Yes                                 +---------+---------------+---------+-----------+----------+--------------+ SFJ      Full                                                        +---------+---------------+---------+-----------+----------+--------------+ FV Prox  Full                                                        +---------+---------------+---------+-----------+----------+--------------+ FV Mid   Full                                                        +---------+---------------+---------+-----------+----------+--------------+ FV DistalFull                                                        +---------+---------------+---------+-----------+----------+--------------+ PFV      Full                                                        +---------+---------------+---------+-----------+----------+--------------+ POP      Full           Yes      Yes                                 +---------+---------------+---------+-----------+----------+--------------+ PTV      Full                                                        +---------+---------------+---------+-----------+----------+--------------+ PERO     Full                                                        +---------+---------------+---------+-----------+----------+--------------+ Gastroc  None                                         Acute          +---------+---------------+---------+-----------+----------+--------------+   +---------+---------------+---------+-----------+----------+--------------+ LEFT     CompressibilityPhasicitySpontaneityPropertiesThrombus Aging  +---------+---------------+---------+-----------+----------+--------------+ CFV      Full  Yes      Yes                                 +---------+---------------+---------+-----------+----------+--------------+ SFJ      Full                                                        +---------+---------------+---------+-----------+----------+--------------+ FV Prox  Full                                                        +---------+---------------+---------+-----------+----------+--------------+ FV Mid   Full                                                        +---------+---------------+---------+-----------+----------+--------------+ FV DistalFull                                                        +---------+---------------+---------+-----------+----------+--------------+ PFV      Full                                                        +---------+---------------+---------+-----------+----------+--------------+ POP      Full           Yes      Yes                                 +---------+---------------+---------+-----------+----------+--------------+ PTV      Full                                                        +---------+---------------+---------+-----------+----------+--------------+ PERO     Full                                                        +---------+---------------+---------+-----------+----------+--------------+ Gastroc  None                                         Acute          +---------+---------------+---------+-----------+----------+--------------+     Summary: Right: Findings consistent with acute deep vein thrombosis involving the right gastrocnemius veins. Left:  Findings consistent with acute deep vein thrombosis involving the left gastrocnemius veins.  *See table(s) above for measurements and observations. Electronically signed by Deitra Mayo MD on 10/13/2019 at 6:23:58 AM.    Final      Labs:  Basic Metabolic Panel: Recent Labs  Lab 10/13/19 0501 10/18/19 0459  NA 138 140  K 3.9 4.7  CL 98 102  CO2 29 31  GLUCOSE 158* 139*  BUN 15 20  CREATININE 0.88 0.95  CALCIUM 9.3 9.5    CBC: Recent Labs  Lab 10/13/19 0501 10/18/19 0459  WBC 8.9 9.1  NEUTROABS 5.3 5.4  HGB 13.0 13.0  HCT 39.5 39.9  MCV 92.7 92.1  PLT 230 293    CBG: Recent Labs  Lab 10/17/19 0611 10/17/19 1144 10/17/19 1655 10/17/19 2107 10/18/19 0731  GLUCAP 122* 123* 103* 125* 168*   Family history.  Mother with hypertension and diabetes as well as kidney failure.  Father with diabetes.  Sister with diabetes.  Brother with diabetes.  Denies hyperlipidemia or colon cancer  Brief HPI:   Aaron Burns is a 63 y.o. right-handed male with history of diabetes mellitus hypertension and morbid obesity with BMI 36.92, tobacco abuse and right total knee replacement 2016.  Presented 10/07/2019 with progressive low back pain radiating to the lower extremities.  X-rays and imaging revealed degenerative disc disease with slight scoliosis and radiculopathy L3-4 and 4-5.  Underwent posterior spinal fusion interbody decompression with oblique exposure L3-L5 10/08/2019 per Dr. Rolena Infante.  Hospital course pain management.  Back brace when out of bed applied in sitting position.  Patient was admitted for a comprehensive rehab program   Hospital Course: Aaron Burns was admitted to rehab 10/12/2019 for inpatient therapies to consist of PT, ST and OT at least three hours five days a week. Past admission physiatrist, therapy team and rehab RN have worked together to provide customized collaborative inpatient rehab.  In regards to patient's lumbar radiculopathy degenerative disc disease underwent posterior instrumented fusion oblique exposure L3-L5 10/08/2019 per Dr. Rolena Infante.  He would follow-up with orthopedic services for removal of sutures.  Back brace when out of bed.  DVT prophylaxis with venous Doppler studies completed  10/12/2019 showing bilateral gastrocnemius DVT initially placed on Lovenox transition to Xarelto after discussed with orthopedic services.  Pain managed with use of Neurontin scheduled hydrocodone and Robaxin as needed.  Blood sugars overall controlled hemoglobin A1c of 7.3 Glucophage as directed.  Blood pressure monitored while on ziac and would follow-up with primary MD.  Bouts of constipation resolved with laxative assistance.   Blood pressures were monitored on TID basis and controlled  Diabetes has been monitored with ac/hs CBG checks and SSI was use prn for tighter BS control.   He is continent of bowel and bladder.  He has made gains during rehab stay and is attending therapies  He will continue to receive follow up therapies   after discharge  Rehab course: During patient's stay in rehab weekly team conferences were held to monitor patient's progress, set goals and discuss barriers to discharge. At admission, patient required minimal guard ambulate 120 feet rolling walker, minimal assist sit to stand, minimal assist side-lying to sitting.  Minimal guard upper body bathing minimal assist lower body bathing minimal guard lower body dressing.  Physical exam.  Blood pressure 124/74 pulse 85 temperature 97.7 respirations 20 oxygen saturation 94% room air Constitutional well-developed no distress HEENT Head.  Normocephalic and atraumatic Mouth and throat.  Oropharynx clear and moist Eyes.  Pupils round and reactive to light no nystagmus Neck.  Supple nontender no thyromegaly without JVD Cardiovascular regular rate rhythm no extra sounds or murmurs Respiratory clear to auscultation no wheezes rales or rhonchi Neurological.  Alert oriented no complaints.  Hip flexors 4+ out of 5 otherwise 5 out of 5 in upper and lower extremities. Skin.  Back incision clean and dry  He/  has had improvement in activity tolerance, balance, postural control as well as ability to compensate for deficits. He/  has had improvement in functional use RUE/LUE  and RLE/LLE as well as improvement in awareness.  Performed standing balance with supervision.  Ambulates extended distances with a rolling walker.  Ambulates to the therapy gym and back to his room with rolling walker.  Gather his belongings for activities the living and homemaking.  Family teaching completed plan discharge to home       Disposition: Discharged home  Special instructions No driving smoking or alcohol  Back brace when out of bed  Follow-up Dr. Rolena Infante for removal of sutures   Diet: Diabetic diet  Medications at discharge. 1.  Tylenol as needed 2.Ziac 5-6.25 mg 1 tab daily 3.  Neurontin 600 mg p.o. twice daily 4.  Hydrocodone 5-325 mg 1 tablet every 4 hours as needed pain 5.  Melatonin 3 mg nightly 6.  Glucophage 500 mg breakfast 1000 mg every evening 7.  Robaxin 500 mg every 6 hours as needed muscle spasms 8.  MiraLAX daily hold for loose stools 9.  Xarelto as directed for bilateral gastrocnemius DVTs.     Follow-up Information    Lovorn, Jinny Blossom, MD Follow up.   Specialty: Physical Medicine and Rehabilitation Why: Call for appointment Contact information: [id number] [place] [address] [phone]        Melina Schools, MD Follow up.   Specialty: Orthopedic Surgery Why: Call for appointment Contact information: [address] [place] [address] [place] [address] [id number]        Gemma Payor, MD .   Specialty: [facility] Medicine Contact information: [place] [address] [phone]           Signed: Cathlyn Parsons 10/18/2019, 8:36 AM

## 2019-10-18 NOTE — Plan of Care (Signed)
  Problem: Consults Goal: RH SPINAL CORD INJURY PATIENT EDUCATION Description:  See Patient Education module for education specifics.  Outcome: Progressing   Problem: RH SKIN INTEGRITY Goal: RH STG SKIN FREE OF INFECTION/BREAKDOWN Description: No new breakdown or infection with min assist  Outcome: Progressing Goal: RH STG ABLE TO PERFORM INCISION/WOUND CARE W/ASSISTANCE Description: STG Able To Perform Incision/Wound Care With min Assistance. Outcome: Progressing   Problem: RH PAIN MANAGEMENT Goal: RH STG PAIN MANAGED AT OR BELOW PT'S PAIN GOAL Description: < 3 out of 10.  Outcome: Progressing   Problem: RH KNOWLEDGE DEFICIT SCI Goal: RH STG INCREASE KNOWLEDGE OF SELF CARE AFTER SCI Description: Pt will be able to demonstrate proper care after SCI including back precautions, donning and doffing brace, and wound care with min assist/cues  Outcome: Progressing

## 2019-10-18 NOTE — Progress Notes (Signed)
Occupational Therapy Session Note  Patient Details  Name: Aaron Burns MRN: [id number] Date of Birth: [birth year]  Today's Date: 10/18/2019 OT Individual Time: [id number] OT Individual Time Calculation (min): 60 min    Short Term Goals: Week 1:  OT Short Term Goal 1 (Week 1): STG=LTG due to LOS  Skilled Therapeutic Interventions/Progress Updates:    Pt seen for OT session focusing on functional transfers and IADLre-training. Pt sitting up in recliner upon arrival, agreeable to tx session and denying pain. He ambulated throughout session at distant [place] using RW.Pt with much improved RW management in functional context compaired to previous sessions. In ADL apartment,completed simulated shower stall transfer.Folloiwing demonstration and VCs for technique, pt return demonstrated transfer with supervision.Discussed wear schedule of LSO when entering/exitingshower. Completed bedmbility on standard bed.x2 trials with multi-modal cuing for use of log rolling technique, supervision overall with improved carryover of education.        In ADL kitchen, practiced RW management in functional context and kitchen accessibility, practicing retrieving items from refrigerator and overhead cabinets as well as problem solving how to transport items, etc.    Pt returned to room at end of session, left sitting up in recliner with all needs in reach. Pt made mod I in room in prep for d/c home tomorrow at mod I level.                    , Therapy Documentation Precautions:  Precautions Precautions: Back, Fall Precaution Comments: Pt recalls 3/3 precautions Required Braces or Orthoses: Spinal Brace Spinal Brace: Lumbar corset, Applied in sitting position Restrictions Weight Bearing Restrictions: No   Therapy/Group: Individual Therapy  Malia Corsi L 10/18/2019, 6:56 AM

## 2019-10-19 LAB — GLUCOSE, CAPILLARY: Glucose-Capillary: 133 mg/dL — ABNORMAL HIGH (ref 70–99)

## 2019-10-19 NOTE — Progress Notes (Signed)
[facility] [facility] PHYSICAL MEDICINE & REHABILITATION PROGRESS NOTE   Subjective/Complaints:  [name] is happy only 1 DVT left in R gastroc- going home on Xarelto Happy for d/c today.  ROS- denies SOB, CP N/V/D. Objective:   Vas [place] Lower Extremity Venous (dvt)  Result Date: 10/18/2019  Lower Venous Study Indications: Follow- up dvt.  Comparison Study: previous study done 10/12/19 Performing Technologist: Abram Sander RVS  Examination Guidelines: A complete evaluation includes B-mode imaging, spectral Doppler, color Doppler, and power Doppler as needed of all accessible portions of each vessel. Bilateral testing is considered an integral part of a complete examination. Limited examinations for reoccurring indications may be performed as noted.  +---------+---------------+---------+-----------+----------+--------------+ RIGHT    CompressibilityPhasicitySpontaneityPropertiesThrombus Aging +---------+---------------+---------+-----------+----------+--------------+ CFV      Full           Yes      Yes                                 +---------+---------------+---------+-----------+----------+--------------+ SFJ      Full                                                        +---------+---------------+---------+-----------+----------+--------------+ FV Prox  Full                                                        +---------+---------------+---------+-----------+----------+--------------+ FV Mid   Full                                                        +---------+---------------+---------+-----------+----------+--------------+ FV DistalFull                                                        +---------+---------------+---------+-----------+----------+--------------+ PFV      Full                                                        +---------+---------------+---------+-----------+----------+--------------+ POP      Full           Yes      Yes                                  +---------+---------------+---------+-----------+----------+--------------+ PTV      Full                                                        +---------+---------------+---------+-----------+----------+--------------+  PERO     Full                                                        +---------+---------------+---------+-----------+----------+--------------+ Gastroc  None                                         Acute          +---------+---------------+---------+-----------+----------+--------------+   +---------+---------------+---------+-----------+----------+--------------+ LEFT     CompressibilityPhasicitySpontaneityPropertiesThrombus Aging +---------+---------------+---------+-----------+----------+--------------+ CFV      Full           Yes      Yes                                 +---------+---------------+---------+-----------+----------+--------------+ SFJ      Full                                                        +---------+---------------+---------+-----------+----------+--------------+ FV Prox  Full                                                        +---------+---------------+---------+-----------+----------+--------------+ FV Mid   Full                                                        +---------+---------------+---------+-----------+----------+--------------+ FV DistalFull                                                        +---------+---------------+---------+-----------+----------+--------------+ PFV      Full                                                        +---------+---------------+---------+-----------+----------+--------------+ POP      Full           Yes      Yes                                 +---------+---------------+---------+-----------+----------+--------------+ PTV      Full                                                         +---------+---------------+---------+-----------+----------+--------------+  PERO     Full                                                        +---------+---------------+---------+-----------+----------+--------------+ Gastroc  Full                                                        +---------+---------------+---------+-----------+----------+--------------+     Summary: Right: Findings consistent with acute deep vein thrombosis involving the right gastrocnemius veins. No cystic structure found in the popliteal fossa. Left: There is no evidence of deep vein thrombosis in the lower extremity. No cystic structure found in the popliteal fossa.  *See table(s) above for measurements and observations.    Preliminary    Recent Labs    10/18/19 0459  WBC 9.1  HGB 13.0  HCT 39.9  PLT 293   Recent Labs    10/18/19 0459  NA 140  K 4.7  CL 102  CO2 31  GLUCOSE 139*  BUN 20  CREATININE 0.95  CALCIUM 9.5    Intake/Output Summary (Last 24 hours) at 10/19/2019 0933 Last data filed at 10/19/2019 0801 Gross per 24 hour  Intake 940 ml  Output -  Net 940 ml     Physical Exam: Vital Signs Blood pressure (!) 103/47, pulse 68, temperature 98.3 F (36.8 C), resp. rate 17, height 5\' 9"  (1.753 m), weight 113.5 kg, SpO2 92 %.  Physical Exam Nursing note, and vitalsreviewed. Constitutional: He appearswell-developedand well-nourished.No distress. Sitting in bedside chair, NAD HENT:  Head:Normocephalicand atraumatic.  Nose:Nose normal.  Mouth/Throat:Oropharynx is clear and moist.  Eyes:conjugate gaze  Neck:Normal range of motion.Neck supple.No thyromegalypresent.  Cardiovascular: RRR Respiratory: CTA B/L- no wheezes, rales, or rhonchi GI: (+)BS, soft, NT, ND Musculoskeletal:  Comments: HFs 4+/5; otherwise 5/5 in UEs and LEs B/L Neurological: He is alert. Psychiatric: brighter affect Skin back and side-incisions look great- no drainage; no erythema  seen   Assessment/Plan: 1. Functional deficits secondary to L3-L5 fusion which require 3+ hours per day of interdisciplinary therapy in a comprehensive inpatient rehab setting.  Physiatrist is providing close team supervision and 24 hour management of active medical problems listed below.  Physiatrist and rehab team continue to assess barriers to discharge/monitor patient progress toward functional and medical goals  Care Tool:  Bathing    Body parts bathed by patient: Right arm, Left arm, Chest, Abdomen, Front perineal area, Right upper leg, Left upper leg, Face, Buttocks, Right lower leg, Left lower leg   Body parts bathed by helper: Buttocks, Right lower leg, Left lower leg     Bathing assist Assist Level: Independent with assistive device Assistive Device Comment: Seated   Upper Body Dressing/Undressing Upper body dressing   What is the patient wearing?: Pull over shirt, Orthosis Orthosis activity level: Performed by patient  Upper body assist Assist Level: Independent    Lower Body Dressing/Undressing Lower body dressing      What is the patient wearing?: Underwear/pull up, Pants     Lower body assist Assist for lower body dressing: Independent with assitive device Assistive Device Comment: RW   Toileting Toileting    Toileting assist Assist for toileting: Independent  with assistive device Assistive Device Comment: rolling walker   Transfers Chair/bed transfer  Transfers assist     Chair/bed transfer assist level: Independent with assistive device Chair/bed transfer assistive device: Programmer, multimedia   Ambulation assist      Assist level: Independent with assistive device Assistive device: Walker-rolling Max distance: 200'   Walk 10 feet activity   Assist     Assist level: Independent with assistive device Assistive device: Walker-rolling   Walk 50 feet activity   Assist    Assist level: Independent with assistive  device Assistive device: Walker-rolling    Walk 150 feet activity   Assist    Assist level: Independent with assistive device Assistive device: Walker-rolling    Walk 10 feet on uneven surface  activity   Assist Walk 10 feet on uneven surfaces activity did not occur: Safety/medical concerns   Assist level: Supervision/Verbal cueing Assistive device: Aeronautical engineer Will patient use wheelchair at discharge?: No Type of Wheelchair: Manual    Wheelchair assist level: Supervision/Verbal cueing Max wheelchair distance: 150'    Wheelchair 50 feet with 2 turns activity    Assist        Assist Level: Supervision/Verbal cueing   Wheelchair 150 feet activity     Assist      Assist Level: Supervision/Verbal cueing   Blood pressure (!) 103/47, pulse 68, temperature 98.3 F (36.8 C), resp. rate 17, height 5\' 9"  (1.753 m), weight 113.5 kg, SpO2 92 %.  Medical Problem List and Plan: 1.Decreased functional mobilitysecondary to degenerative disc disease with slight scoliosis and radiculopathy L3-4 and 4-5. Status post posterior instrumented fusionwith oblique exposureL3-L5 10/08/2019. Back brace with out of bed.Plan suture removal 2 weeks from day of surgery. 2. Antithrombotics: -DVT/anticoagulation:SCDs. Check vascular study (per Ortho, doesn't feel comfortable with additional anticoagulation) 10/28- Pt has new B/L gastroc DVTs- got OK from surgeon to put on Lovenox 40 mg daily- asked [place] to let them know if was therapeutic dosing; will recheck Vascular study in 1 week.  11/1- pt leaving before 1 week, so will order Dopplers as of 11/2 -antiplatelet therapy: N/A.Patient on aspirin 81 mg daily prior to admission. Will discuss resuming with orthopedics Dr. Rolena Infante  11/2 Surgeon started Mr. Hagemeier on Xarelto today and he will follow up with surgeon this week.  3. Pain Management:Neurontin 300 mg 3 times  daily,Norco,and Robaxin as needed  10/29- will increase Gabapentin for pt- to 600 mg BID x 3 days then increase again if required  10/31- better control 4. Mood:Provide emotional support -antipsychotic agents: N/A 5. Neuropsych: This patientiscapable of making decisions on hisown behalf. 6. Skin/Wound Care:Routine skin checks 7. Fluids/Electrolytes/Nutrition:Routine in and outs with follow-up chemistries 8. Diabetes mellitus. Hemoglobin A1c 7.3. SSI. Glucophage 500 mg 1 tablet breakfast and 2 tablets at supper prior to admission. Will con't to monitor and titrate to get better control of BGs. CBG (last 3)  Recent Labs    10/18/19 1654 10/18/19 2101 10/19/19 0605  GLUCAP 89 126* 133*  11/1- adequate control 9. Hypertension.Ziac5-6.25 mg daily. Will monitor based on starting to mobilize  11/1- BP controlled 10. Dispo  11/3- d/c today; no need for scheduled f/u with rehab, but our number in d/c paperwork in case he needs [place].     LOS: 7 days A FACE TO FACE EVALUATION WAS PERFORMED  Lynette Noah 10/19/2019, 9:33 AM

## 2019-10-19 NOTE — Plan of Care (Signed)
  Problem: Consults Goal: RH SPINAL CORD INJURY PATIENT EDUCATION Description:  See Patient Education module for education specifics.  Outcome: Completed/Met   Problem: RH SKIN INTEGRITY Goal: RH STG SKIN FREE OF INFECTION/BREAKDOWN Description: No new breakdown or infection with min assist  Outcome: Completed/Met Goal: RH STG ABLE TO PERFORM INCISION/WOUND CARE W/ASSISTANCE Description: STG Able To Perform Incision/Wound Care With min Assistance. Outcome: Completed/Met   Problem: RH PAIN MANAGEMENT Goal: RH STG PAIN MANAGED AT OR BELOW PT'S PAIN GOAL Description: < 3 out of 10.  Outcome: Completed/Met   Problem: RH KNOWLEDGE DEFICIT SCI Goal: RH STG INCREASE KNOWLEDGE OF SELF CARE AFTER SCI Description: Pt will be able to demonstrate proper care after SCI including back precautions, donning and doffing brace, and wound care with min assist/cues  Outcome: Completed/Met

## 2019-10-19 NOTE — Progress Notes (Signed)
Social Work Discharge Note   The overall goal for the admission was met for:   Discharge location: Yes - home with wife who can provide supervision/ assist  Length of Stay: Yes - 7 days  Discharge activity level: Yes - supervision  Home/community participation: Yes  Services provided included: MD, RD, PT, OT, RN, Pharmacy and SW  Financial Services: Private Insurance: [place]  Follow-up services arranged: Outpatient: PT  - pt to restart PT @ Dr. Rolena Infante office and Patient/Family request agency HH: request OPPT at [facility] office, DME: NA - had all needed DME  Comments (or additional information):      Contact info:  Pt @ [phone]  Patient/Family verbalized understanding of follow-up arrangements: Yes  Individual responsible for coordination of the follow-up plan: pt  Confirmed correct DME delivered:  NA    Aariyah Sampey

## 2019-10-20 NOTE — Patient Care Conference (Cosign Needed)
Inpatient RehabilitationTeam Conference and Plan of Care Update Date: 10/19/2019   Time: 11:16 AM    Patient Name: Aaron Burns      Medical Record Number: [id number]  Date of Birth: May 04, 1956 Sex: Male         Room/Bed: 4M09C/4M09C-01 Payor Info: Payor: [place] / Plan: BCBS COMM PPO / Product Type: *No Product type* /    Admit Date/Time:  10/12/2019  3:39 PM  Primary Diagnosis:  Lumbar radiculopathy  Patient Active Problem List   Diagnosis Date Noted  . Lumbar radiculopathy 10/12/2019  . 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 FH: hypertension 11/26/2018  . Smoker 11/26/2018  . Morbid obesity (BMI 36.31)  06/07/2015  . Medication management 06/10/2014  . Vitamin D deficiency 11/30/2013  . Mixed hyperlipidemia 11/30/2013  . Hypertension   . Type 2 diabetes mellitus [facility])     Expected Discharge Date: Expected Discharge Date: 10/19/19  Team Members Present: Physician leading conference: Dr. Alger Simons Social Worker Present: Lennart Pall, LCSW Nurse Present: Ellison Carwin, LPN PT Present: Excell Seltzer, PT OT Present: Amy Rounds, OT SLP Present: Jettie Booze, CF-SLP PPS Coordinator present : Gunnar Fusi, Novella Olive, PT     Current Status/Progress Goal Weekly Team Focus  Bowel/Bladder             Swallow/Nutrition/ Hydration             ADL's   Mod I overall, supervision shower stall transfers  Mod I overall, supervision shower stall transfers  Pt completed rehab program, d/c home with wife   Mobility   mod I overall with RW  mod I  d/c home   Communication             Safety/Cognition/ Behavioral Observations            Pain             Skin              Rehab Goals Patient on target to meet rehab goals: Yes *See Care Plan and progress notes for long and short-term goals.     Barriers to Discharge  Current Status/Progress Possible Resolutions Date Resolved   Nursing                  PT                    OT         SLP                SW                Discharge Planning/Teaching Needs:  Pt returning home with wife who can provide 24/7 support.  Ready for d/c today.  NA   Team Discussion:  Pt has met all goals (see above) and ready for d/c today.  Revisions to Treatment Plan:  NA    Medical Summary Current Status: L gastroc DVT; RLE gastroc DVT still there- placed on Xarelto Weekly Focus/Goal: pt still wearing LSO- walking well with RW; d/c today  Barriers to Discharge: Home enviroment access/layout  Barriers to Discharge Comments: n/a Possible Resolutions to Barriers: more outpt therapy is the goal   Continued Need for Acute Rehabilitation Level of Care: The patient requires daily medical management by a physician with specialized training in physical medicine and rehabilitation for the following reasons: Direction of a multidisciplinary physical rehabilitation program to maximize functional independence : Yes  Medical management of patient stability for increased activity during participation in an intensive rehabilitation regime.: Yes Analysis of laboratory values and/or radiology reports with any subsequent need for medication adjustment and/or medical intervention. : Yes   I attest that I was present, lead the team conference, and concur with the assessment and plan of the team.   Jezreel Justiniano 10/20/2019, 9:16 AM   Team conference was held via web/ teleconference due to [place] - 19

## 2019-10-21 ENCOUNTER — Telehealth: Payer: Self-pay | Admitting: *Deleted

## 2019-10-21 NOTE — Telephone Encounter (Signed)
Called patient on 10/21/2019 , 9:12 AM in an attempt to reach the patient for a hospital follow up.   Admit date: 10/12/19 Discharge: 10/19/19   He does not have any questions or concerns about medications from the hospital admission. The patient's medications were reviewed over the phone, they were counseled to bring in all current medications to the hospital follow up visit.   I advised the patient to call if any questions or concerns arise about the hospital admission or medications    Home health was not started in the hospital. The patient will have out patient physical therapy, which will be set up by the surgeon's office. All questions were answered and a follow up appointment was made. The patient will have a telephone follow up visit.  Prior to Admission medications   Medication Sig Start Date End Date Taking? Authorizing Provider  acetaminophen (TYLENOL) 325 MG tablet Take 2 tablets (650 mg total) by mouth every 4 (four) hours as needed for mild pain ((score 1 to 3) or temp > 100.5). 10/18/19   Angiulli, Lavon Paganini, PA-C  bisoprolol-hydrochlorothiazide [facility]) 5-6.25 MG tablet Take 1 tablet Daily for BP 10/18/19   Angiulli, Lavon Paganini, PA-C  Cholecalciferol (VITAMIN D3 MAXIMUM STRENGTH) 5000 UNITS capsule Take 10,000 Units by mouth daily.     [provider]  CINNAMON PO Take 1,000 mg by mouth daily.    [provider]  gabapentin (NEURONTIN) 300 MG capsule Take 2 capsules (600 mg total) by mouth 2 (two) times daily. 10/18/19   Angiulli, Lavon Paganini, PA-C  HYDROcodone-acetaminophen (NORCO/VICODIN) 5-325 MG tablet Take 1 tablet by mouth every 4 (four) hours as needed for moderate pain. 10/18/19   Angiulli, Lavon Paganini, PA-C  Magnesium 250 MG TABS Take 250 mg by mouth at bedtime.     [provider]  Melatonin 3 MG TABS Take 1 tablet (3 mg total) by mouth at bedtime. 10/18/19   Angiulli, Lavon Paganini, PA-C  metFORMIN (GLUCOPHAGE-XR) 500 MG 24 hr tablet Take 1 tablet with Bkfst &  Lunch & 2 tablets with Supper for Diabetes Patient taking differently: Take 500-1,000 mg by mouth See admin instructions. Take 500 mg by mouth in the morning, 500 mg in the afternoon and 1000 mg in the evening 08/29/19   Unk Pinto, MD  methocarbamol (ROBAXIN) 500 MG tablet Take 1 tablet (500 mg total) by mouth every 6 (six) hours as needed for muscle spasms. 10/18/19   Angiulli, Lavon Paganini, PA-C  Multiple Vitamin (MULTIVITAMIN WITH MINERALS) TABS Take 1 tablet by mouth daily.    [provider]  Rivaroxaban (XARELTO) 15 MG TABS tablet Take 1 tablet (15 mg total) by mouth 2 (two) times daily with a meal for 20 days. 10/18/19 11/07/19  Angiulli, Lavon Paganini, PA-C  rivaroxaban (XARELTO) 20 MG TABS tablet Take 1 tablet (20 mg total) by mouth daily with supper. 11/08/19   Angiulli, Lavon Paganini, PA-C  vitamin C (ASCORBIC ACID) 500 MG tablet Take 1,000 mg by mouth daily.    [provider]  zinc gluconate 50 MG tablet Take 50 mg by mouth daily.    [provider]

## 2019-11-01 ENCOUNTER — Encounter: Payer: Self-pay | Admitting: Internal Medicine

## 2019-11-01 ENCOUNTER — Other Ambulatory Visit: Payer: Self-pay

## 2019-11-01 ENCOUNTER — Ambulatory Visit: Payer: BC Managed Care – PPO | Admitting: Internal Medicine

## 2019-11-01 VITALS — BP 120/80 | HR 72 | Temp 97.2°F | Resp 16 | Ht 69.5 in | Wt 245.6 lb

## 2019-11-01 DIAGNOSIS — I10 Essential (primary) hypertension: Secondary | ICD-10-CM | POA: Diagnosis not present

## 2019-11-01 DIAGNOSIS — E782 Mixed hyperlipidemia: Secondary | ICD-10-CM

## 2019-11-01 DIAGNOSIS — M4327 Fusion of spine, lumbosacral region: Secondary | ICD-10-CM

## 2019-11-01 DIAGNOSIS — E559 Vitamin D deficiency, unspecified: Secondary | ICD-10-CM | POA: Diagnosis not present

## 2019-11-01 DIAGNOSIS — M5416 Radiculopathy, lumbar region: Secondary | ICD-10-CM | POA: Diagnosis not present

## 2019-11-01 DIAGNOSIS — N181 Chronic kidney disease, stage 1: Secondary | ICD-10-CM

## 2019-11-01 DIAGNOSIS — Z79899 Other long term (current) drug therapy: Secondary | ICD-10-CM | POA: Diagnosis not present

## 2019-11-01 DIAGNOSIS — E1122 Type 2 diabetes mellitus with diabetic chronic kidney disease: Secondary | ICD-10-CM | POA: Diagnosis not present

## 2019-11-01 DIAGNOSIS — I82403 Acute embolism and thrombosis of unspecified deep veins of lower extremity, bilateral: Secondary | ICD-10-CM

## 2019-11-01 MED ORDER — GABAPENTIN 800 MG PO TABS: ORAL_TABLET | ORAL | 0 refills | Status: DC

## 2019-11-01 NOTE — Progress Notes (Signed)
Aaron Burns     This very nice 63 y.o. MWM was admitted to the hospital on 10/08/2019   and patient was discharged from the hospital 13 days ago on  10/19/2019. The patient now presents for follow up for transition from recent hospitalization.  The day after discharge our clinical staff contacted the patient to assure stability and schedule a follow up appointment. The discharge summary, medications and diagnostic test results were reviewed before meeting with the patient. 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 Patient was also dx'd with bilateral LE DVT treated with Lovenox and transitioned to Xarelto. While in the hospital his Diabetes was covered with SS Insulin. When patient was ambulating with a rolling walker, he was discharged for out patient f/u.      Hospitalization discharge instructions and medications are reconciled with the patient.      Patient is also followed with Hypertension, Hyperlipidemia, Pre-Diabetes and Vitamin D Deficiency.      Patient is treated for HTN (2008) & BP has been controlled at home. Today's BP is at goal - 120/80. In 2008, patient had a negative Stress Myoview.  Patient has had no complaints of any cardiac type chest pain, palpitations, dyspnea/orthopnea/PND, dizziness, claudication, or dependent edema.     Hyperlipidemia is controlled with diet & meds. Patient denies myalgias or other med SE's. Last Lipids were at goal:  Lab Results  Component Value Date   CHOL 158 07/15/2019   HDL 38 (L) 07/15/2019   LDLCALC 83 07/15/2019   TRIG 306 (H) 07/15/2019   CHOLHDL 4.2 07/15/2019      Also, the patient has  PreMorbid Obesity (BMI 35+) and  PreDM (6.0% in 2009)  & then  Dx'd with T2_NIDDM (A1c 6.7% / 2016)  and has had no symptoms of reactive hypoglycemia, diabetic polys, paresthesias or visual blurring.  Last A1c was not at goal:  Lab Results  Component Value Date   HGBA1C 7.4 (H) 10/08/2019      Further, the patient also has history of Vitamin D Deficiency  ("35" / 2008)  and supplements vitamin D without any suspected side-effects. Last vitamin D was  Near goal (70-100):  Lab Results  Component Value Date   VD25OH 54 07/15/2019   Current Outpatient Medications on File Prior to Visit  Medication Sig  . acetaminophen (TYLENOL) 325 MG tablet Take 2 tablets (650 mg total) by mouth every 4 (four) hours as needed for mild pain ((score 1 to 3) or temp > 100.5).  . bisoprolol-hydrochlorothiazide (ZIAC) 5-6.25 MG tablet Take 1 tablet Daily for BP  . Cholecalciferol (VITAMIN D3 MAXIMUM STRENGTH) 5000 UNITS capsule Take 10,000 Units by mouth daily.   [name] CINNAMON PO Take 1,000 mg by mouth daily.  [name] HYDROcodone-acetaminophen (NORCO/VICODIN) 5-325 MG tablet Take 1 tablet by mouth every 4 (four) hours as needed for moderate pain.  . Magnesium 250 MG TABS Take 250 mg by mouth at bedtime.   . Melatonin 3 MG TABS Take 1 tablet (3 mg total) by mouth at bedtime.  . metFORMIN (GLUCOPHAGE-XR) 500 MG 24 hr tablet Take 1 tablet with Bkfst & Lunch & 2 tablets with Supper for Diabetes (Patient taking differently: Take  500-1,000 mg by mouth See admin instructions. Take 500 mg by mouth in the morning, 500 mg in the afternoon and 1000 mg in the evening)  . methocarbamol (ROBAXIN) 500 MG tablet Take 1 tablet (500 mg total) by mouth every 6 (six) hours as needed for muscle spasms.  . Multiple Vitamin (MULTIVITAMIN WITH MINERALS) TABS Take 1 tablet by mouth daily.  . Rivaroxaban (XARELTO) 15 MG TABS tablet Take 1 tablet (15 mg total) by mouth 2 (two) times daily with a meal for 20 days.  Derrill Memo ON 11/08/2019] rivaroxaban (XARELTO) 20 MG TABS tablet Take 1  tablet (20 mg total) by mouth daily with supper.  . vitamin C (ASCORBIC ACID) 500 MG tablet Take 1,000 mg by mouth daily.  [name] zinc gluconate 50 MG tablet Take 50 mg by mouth daily.   No current facility-administered medications on file prior to visit.    Allergies  Allergen Reactions  . Ppd [Tuberculin Purified Protein Derivative]     Pt states he is not allergic to this.Marland Kitchenpositive tb test 2014, pt took tb treatment 2014   PMHx:   Past Medical History:  Diagnosis Date  . Diabetes mellitus, type 2 ([place])   . Elevated hemoglobin A1c   . Heart murmur    at birth  . History of kidney stones 2011  . Hypertension   . Low back pain    Radiates to left ankle.  Injured after falling on back.  . Morbid obesity (BMI 36. 06/07/2015  . 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 Renal disorder   . Tuberculosis    2014 dx, was treated with medications for about 4 months   Immunization History  Administered Date(s) Administered  . Influenza Inj Mdck Quad With Preservative 10/10/2017  . Influenza Split 12/01/2013  . Influenza-Unspecified 10/02/2018, 08/14/2019  . Pneumococcal Polysaccharide-23 10/10/2017  . Td 12/17/2003  . Tdap 02/14/2015   Past Surgical History:  Procedure Laterality Date  . ABDOMINAL EXPOSURE N/A 10/07/2019   Procedure: ABDOMINAL EXPOSURE;  Surgeon: Rosetta Posner, MD;  Location: [facility];  Service: Vascular;  Laterality: N/A;  . ANTERIOR LUMBAR FUSION N/A 10/07/2019   Procedure: Oblique lateral interbody fusion LUMBAR FOUR-FIVE, anterior lateral interbody fusion LUMBAR THREE-FOUR;  Surgeon: Melina Schools, MD;  Location: [facility];  Service: Orthopedics;  Laterality: N/A;  TAB BLOCK WITH EXPAREL  . CHOLECYSTECTOMY  07/27/2012   Procedure: LAPAROSCOPIC CHOLECYSTECTOMY WITH INTRAOPERATIVE CHOLANGIOGRAM;  Surgeon: Marcello Moores A. Cornett, MD;  Location: [facility];  Service: General;  Laterality: N/A;  . COLONOSCOPY    .  CYSTOSCOPY/RETROGRADE/URETEROSCOPY/STONE EXTRACTION WITH BASKET  5/11  . HERNIA REPAIR     as a baby  . KNEE SURGERY Right 11/25/11 and 2015   arthroscopic, cortisone injections done also  . TOTAL KNEE ARTHROPLASTY Right 06/06/2015   Procedure: RIGHT TOTAL KNEE ARTHROPLASTY;  Surgeon: Paralee Cancel, MD;  Location: [facility];  Service: Orthopedics;  Laterality: Right;  . WISDOM TOOTH EXTRACTION  yrs ago   FHx:    Reviewed / unchanged  SHx:    Reviewed / unchanged   Systems Review:  Constitutional: Denies fever, chills, wt changes, headaches, insomnia, fatigue, night sweats, change in appetite. Eyes: Denies redness, blurred vision, diplopia, discharge, itchy, watery eyes.  ENT: Denies discharge, congestion, post nasal drip, epistaxis, sore throat, earache, hearing loss, dental pain, tinnitus, vertigo, sinus pain, snoring.  CV: Denies chest pain, palpitations, irregular heartbeat, syncope, dyspnea, diaphoresis, orthopnea, PND, claudication or edema.  Respiratory: denies cough, dyspnea, DOE, pleurisy, hoarseness, laryngitis, wheezing.  Gastrointestinal: Denies dysphagia, odynophagia, heartburn, reflux, water brash, abdominal pain or cramps, nausea, vomiting, bloating, diarrhea, constipation, hematemesis, melena, hematochezia  or hemorrhoids. Genitourinary: Denies dysuria, frequency, urgency, nocturia, hesitancy, discharge, hematuria or flank pain. Musculoskeletal: Denies arthralgias, myalgias, stiffness, jt. swelling, pain, limping or strain/sprain.  Skin: Denies pruritus, rash, hives, warts, acne, eczema or change in skin lesion(s). Neuro: No weakness, tremor, incoordination, spasms, paresthesia or pain. Psychiatric: Denies confusion, memory loss or sensory loss. Endo: Denies change in weight, skin or hair change.  Heme/Lymph: No excessive bleeding, bruising or enlarged lymph nodes.  Physical Exam  BP 120/80   Pulse 72   Temp (!) 97.2 F (36.2 C)   Resp 16   Ht 5' 9.5" (1.765 m)   Wt 245  lb 9.6 oz (111.4 kg)   BMI 35.75 kg/m   Appears over nourished, well groomed  and in no distress.  Eyes: PERRLA, EOMs, conjunctiva no swelling or erythema. Sinuses: No frontal/maxillary tenderness ENT/Mouth: EAC's clear, TM's nl w/o erythema, bulging. Nares clear w/o erythema, swelling, exudates. Oropharynx clear without erythema or exudates. Oral hygiene is good. Tongue normal, non obstructing. Hearing intact.  Neck: Supple. Thyroid nl. Car 2+/2+ without bruits, nodes or JVD. Chest: Respirations nl with BS clear & equal w/o rales, rhonchi, wheezing or stridor.  Cor: Heart sounds normal w/ regular rate and rhythm without sig. murmurs, gallops, clicks or rubs. Peripheral pulses 1+/1+  and equal  without edema. Negative Homans' sign & no calf tenderness to compression,  Abdomen: Soft & bowel sounds normal. Non-tender w/o guarding, rebound, hernias, masses or organomegaly.  Lymphatics: Unremarkable.  Musculoskeletal:generlized FROM with straightening of lumbar lordosis and healing vertical lumbar scar. Gait supported with a rolling walker.  Skin: Warm, dry without exposed rashes, lesions or ecchymosis apparent.  Neuro: Cranial nerves intact, reflexes flat bilaterally. Sensory-motor testing grossly intact. Tendon reflexes grossly intact.  Pysch: Alert & oriented x 3.  Insight and judgement nl & appropriate. No ideations.  Assessment and Plan:  1. Lumbar radiculopathy  - gabapentin (NEURONTIN) 800 MG tablet; Take 1/2 to 1 tablet 2  to 3 x /day for Pain  Dispense: 90 tablet; Refill: 0  2. Fusion of lumbosacral spine  - gabapentin (NEURONTIN) 800 MG tablet; Take 1/2 to 1 tablet 2  to 3 x /day for Pain  Dispense: 90 tablet; Refill: 0  3. Essential hypertension  - Continue medication, monitor blood pressure at home.  - Continue DASH diet. Reminder to go to the ER if any CP,  SOB, nausea, dizziness, severe HA, changes vision/speech.  - CBC with Diff - COMPLETE METABOLIC PANEL WITH GFR -  Magnesium - TSH - Urinalysis, Routine w reflex microscopic  4. Type 2 diabetes mellitus with stage 1 chronic kidney disease, without long-term current use of insulin (HCC)  - Continue diet, exercise, lifestyle modifications.  - Monitor appropriate labs.  5. Vitamin D deficiency  - Continue supplementation.   - Vitamin D (25 hydroxy)  6. Hyperlipemia, mixed  - Continue diet/meds, exercise,& lifestyle modifications.  - Continue monitor periodic cholesterol/liver & renal functions   - Lipid Profile - TSH  7. Deep vein thrombosis (DVT) of both lower extremities, unspecified chronicity, unspecified vein (HCC)  To complete Xarelto 15 mg bid x 3 weeks, then switch to Xarelto 20 mg daily to complete 5 months , then anticipate switching to Xarelto 10 mg daily for 3 to 6 months.   8. Medication management  -  CBC with Diff - COMPLETE METABOLIC PANEL WITH GFR - Magnesium - Lipid Profile - TSH - Vitamin D (25 hydroxy) - Urinalysis, Routine w reflex microscopic         Discussed  regular exercise, BP monitoring, weight control to achieve/maintain BMI less than 25 and discussed meds and SE's. Recommended labs to assess and monitor clinical status with further disposition pending results of labs. Between 40-45 minutes of counseling, chart review,  examination and critical decision making was performed

## 2019-11-01 NOTE — Patient Instructions (Signed)

## 2019-11-02 LAB — URINALYSIS, ROUTINE W REFLEX MICROSCOPIC
Bacteria, UA: NONE SEEN /HPF
Bilirubin Urine: NEGATIVE
Glucose, UA: NEGATIVE
Hyaline Cast: NONE SEEN /LPF
Ketones, ur: NEGATIVE
Nitrite: NEGATIVE
Specific Gravity, Urine: 1.022 (ref 1.001–1.03)
Squamous Epithelial / HPF: NONE SEEN /HPF (ref <=5)
WBC, UA: NONE SEEN /HPF (ref 0–5)
pH: 5.5 (ref 5.0–8.0)

## 2019-11-02 LAB — CBC WITH DIFFERENTIAL/PLATELET
Absolute Monocytes: 897 cells/uL (ref 200–950)
Basophils Absolute: 47 cells/uL (ref 0–200)
Basophils Relative: 0.6 %
Eosinophils Absolute: 117 cells/uL (ref 15–500)
Eosinophils Relative: 1.5 %
HCT: 42.8 % (ref 38.5–50.0)
Hemoglobin: 14 g/dL (ref 13.2–17.1)
Lymphs Abs: 2558 cells/uL (ref 850–3900)
MCH: 29.5 pg (ref 27.0–33.0)
MCHC: 32.7 g/dL (ref 32.0–36.0)
MCV: 90.1 fL (ref 80.0–100.0)
MPV: 10.6 fL (ref 7.5–12.5)
Monocytes Relative: 11.5 %
Neutro Abs: 4181 cells/uL (ref 1500–7800)
Neutrophils Relative %: 53.6 %
Platelets: 229 10*3/uL (ref 140–400)
RBC: 4.75 10*6/uL (ref 4.20–5.80)
RDW: 12.6 % (ref 11.0–15.0)
Total Lymphocyte: 32.8 %
WBC: 7.8 10*3/uL (ref 3.8–10.8)

## 2019-11-02 LAB — COMPLETE METABOLIC PANEL WITH GFR
AG Ratio: 1.6 (calc) (ref 1.0–2.5)
ALT: 29 U/L (ref 9–46)
AST: 17 U/L (ref 10–35)
Albumin: 4.3 g/dL (ref 3.6–5.1)
Alkaline phosphatase (APISO): 98 U/L (ref 35–144)
BUN: 21 mg/dL (ref 7–25)
CO2: 32 mmol/L (ref 20–32)
Calcium: 10.1 mg/dL (ref 8.6–10.3)
Chloride: 100 mmol/L (ref 98–110)
Creat: 0.7 mg/dL (ref 0.70–1.25)
GFR, Est African American: 116 mL/min/{1.73_m2} (ref >=60)
GFR, Est Non African American: 100 mL/min/{1.73_m2} (ref >=60)
Globulin: 2.7 g/dL (calc) (ref 1.9–3.7)
Glucose, Bld: 151 mg/dL — ABNORMAL HIGH (ref 65–99)
Potassium: 4.1 mmol/L (ref 3.5–5.3)
Sodium: 141 mmol/L (ref 135–146)
Total Bilirubin: 0.2 mg/dL (ref 0.2–1.2)
Total Protein: 7 g/dL (ref 6.1–8.1)

## 2019-11-02 LAB — LIPID PANEL
Cholesterol: 176 mg/dL (ref <200)
HDL: 34 mg/dL — ABNORMAL LOW (ref >=40)
Non-HDL Cholesterol (Calc): 142 mg/dL (calc) — ABNORMAL HIGH (ref <130)
Total CHOL/HDL Ratio: 5.2 (calc) — ABNORMAL HIGH (ref <5.0)
Triglycerides: 606 mg/dL — ABNORMAL HIGH (ref <150)

## 2019-11-02 LAB — TSH: TSH: 3.34 mIU/L (ref 0.40–4.50)

## 2019-11-02 LAB — VITAMIN D 25 HYDROXY (VIT D DEFICIENCY, FRACTURES): Vit D, 25-Hydroxy: 42 ng/mL (ref 30–100)

## 2019-11-02 LAB — MAGNESIUM: Magnesium: 1.7 mg/dL (ref 1.5–2.5)

## 2019-11-18 DIAGNOSIS — M545 Low back pain: Secondary | ICD-10-CM | POA: Diagnosis not present

## 2019-11-19 DIAGNOSIS — Z4889 Encounter for other specified surgical aftercare: Secondary | ICD-10-CM | POA: Diagnosis not present

## 2019-11-22 DIAGNOSIS — M545 Low back pain: Secondary | ICD-10-CM | POA: Diagnosis not present

## 2019-11-28 ENCOUNTER — Other Ambulatory Visit: Payer: Self-pay | Admitting: Internal Medicine

## 2019-11-28 DIAGNOSIS — M5416 Radiculopathy, lumbar region: Secondary | ICD-10-CM

## 2019-11-28 DIAGNOSIS — M4327 Fusion of spine, lumbosacral region: Secondary | ICD-10-CM

## 2019-11-30 DIAGNOSIS — M545 Low back pain: Secondary | ICD-10-CM | POA: Diagnosis not present

## 2019-12-03 DIAGNOSIS — M545 Low back pain: Secondary | ICD-10-CM | POA: Diagnosis not present

## 2019-12-04 ENCOUNTER — Other Ambulatory Visit: Payer: Self-pay | Admitting: Internal Medicine

## 2019-12-04 DIAGNOSIS — E119 Type 2 diabetes mellitus without complications: Secondary | ICD-10-CM

## 2019-12-04 MED ORDER — METFORMIN HCL ER 500 MG PO TB24: ORAL_TABLET | ORAL | 3 refills | Status: DC

## 2019-12-14 DIAGNOSIS — M545 Low back pain: Secondary | ICD-10-CM | POA: Diagnosis not present

## 2019-12-20 DIAGNOSIS — M545 Low back pain: Secondary | ICD-10-CM | POA: Diagnosis not present

## 2019-12-24 ENCOUNTER — Other Ambulatory Visit: Payer: Self-pay | Admitting: Internal Medicine

## 2019-12-24 DIAGNOSIS — M545 Low back pain: Secondary | ICD-10-CM | POA: Diagnosis not present

## 2019-12-27 DIAGNOSIS — M545 Low back pain: Secondary | ICD-10-CM | POA: Diagnosis not present

## 2019-12-29 DIAGNOSIS — M545 Low back pain: Secondary | ICD-10-CM | POA: Diagnosis not present

## 2019-12-31 DIAGNOSIS — Z4889 Encounter for other specified surgical aftercare: Secondary | ICD-10-CM | POA: Diagnosis not present

## 2020-01-03 DIAGNOSIS — M545 Low back pain: Secondary | ICD-10-CM | POA: Diagnosis not present

## 2020-01-06 DIAGNOSIS — M545 Low back pain: Secondary | ICD-10-CM | POA: Diagnosis not present

## 2020-01-10 ENCOUNTER — Other Ambulatory Visit: Payer: Self-pay | Admitting: *Deleted

## 2020-01-10 ENCOUNTER — Other Ambulatory Visit: Payer: Self-pay | Admitting: Internal Medicine

## 2020-01-10 DIAGNOSIS — M545 Low back pain: Secondary | ICD-10-CM | POA: Diagnosis not present

## 2020-01-10 MED ORDER — RIVAROXABAN 20 MG PO TABS: ORAL_TABLET | ORAL | 0 refills | Status: DC

## 2020-01-13 DIAGNOSIS — M545 Low back pain: Secondary | ICD-10-CM | POA: Diagnosis not present

## 2020-01-18 DIAGNOSIS — M545 Low back pain: Secondary | ICD-10-CM | POA: Diagnosis not present

## 2020-01-19 ENCOUNTER — Ambulatory Visit: Payer: BC Managed Care – PPO | Admitting: Physician Assistant

## 2020-01-21 DIAGNOSIS — M545 Low back pain: Secondary | ICD-10-CM | POA: Diagnosis not present

## 2020-01-25 DIAGNOSIS — M545 Low back pain: Secondary | ICD-10-CM | POA: Diagnosis not present

## 2020-01-27 DIAGNOSIS — M545 Low back pain: Secondary | ICD-10-CM | POA: Diagnosis not present

## 2020-02-01 DIAGNOSIS — M545 Low back pain: Secondary | ICD-10-CM | POA: Diagnosis not present

## 2020-02-02 DIAGNOSIS — I824Z3 Acute embolism and thrombosis of unspecified deep veins of distal lower extremity, bilateral: Secondary | ICD-10-CM | POA: Insufficient documentation

## 2020-02-02 DIAGNOSIS — E1122 Type 2 diabetes mellitus with diabetic chronic kidney disease: Secondary | ICD-10-CM | POA: Insufficient documentation

## 2020-02-02 NOTE — Progress Notes (Deleted)
FOLLOW UP  Assessment and Plan:   Hypertension Well controlled with current medications  Monitor blood pressure at home; patient to call if consistently greater than 130/80 Continue DASH diet.   Reminder to go to the ER if any CP, SOB, nausea, dizziness, severe HA, changes vision/speech, left arm numbness and tingling and jaw pain.  Hyperlipidemia associated with T2DM ([place]) Currently above goal;  Continue low cholesterol diet and exercise.  Check lipid panel.   Diabetes with diabetic chronic kidney disease Continue medication: metformin, cinnamon supplement - increase to 1000 mg BID Continue diet and exercise.  Perform daily foot/skin check, notify office of any concerning changes.  Check A1C  CKD 1 associated with T2DM (HCC) Increase fluids, avoid NSAIDS, monitor sugars, will monitor  Morbid obesity - BMI 35+ with co morbidities Long discussion about weight loss, diet, and exercise Recommended diet heavy in fruits and veggies and low in animal meats, cheeses, and dairy products, appropriate calorie intake Discussed ideal weight for height  Patient on phentermine with benefit and no SE, taking drug breaks; continue close follow up. Advised he take a drug break.  Discussed increasing vegetable intake compared to protein/carbs Will follow up in 3 months  Vitamin D Def Below goal at last visit;  continue supplementation to maintain goal of 60-100 Defer Vit D level  Smoker ***  Acute bil lower extremity DVTs (HCC) Provoked following back surgery; on xarelto via ortho ***  Continue diet and meds as discussed. Further disposition pending results of labs. Discussed med's effects and SE's.   Over 30 minutes of exam, counseling, chart review, and critical decision making was performed.   Future Appointments  Date Time Provider [place]  02/03/2020  2:30 PM [name], NP [facility] None  05/04/2020 11:00 AM Unk Pinto, MD [facility] None     ----------------------------------------------------------------------------------------------------------------------  HPI 64 y.o. male  presents for 3 month follow up on hypertension, cholesterol, diabetes with CKD 1, morbid obesity and vitamin D deficiency.   Presented 10/07/2019 with progressive low back pain radiating to the lower extremities.  X-rays and imaging revealed degenerative disc disease with slight scoliosis and radiculopathy L3-4 and 4-5.  Underwent posterior spinal fusion interbody decompression with oblique exposure L3-L5 10/08/2019 per Dr. Rolena Infante. DVT prophylaxis with venous Doppler studies completed 10/12/2019 showing bilateral gastrocnemius DVT initially placed on Lovenox transition to Xarelto, continues with 20 mg daily.   Smoker ***  he is prescribed phentermine for weight loss.  While on the medication they have lost 0 lbs since last visit. They deny palpitations, anxiety, trouble sleeping, elevated BP.   BMI is There is no height or weight on file to calculate BMI., he is active at work 10 days 4 days a week; he admits diet hasn't been very good recently.  Wt Readings from Last 3 Encounters:  11/01/19 245 lb 9.6 oz (111.4 kg)  10/12/19 250 lb 3.6 oz (113.5 kg)  10/07/19 250 lb (113.4 kg)   His blood pressure has been controlled at home, today their BP is    He does not workout. He denies chest pain, shortness of breath, dizziness.   He is not on cholesterol medication *** and denies myalgias. His cholesterol is at goal. The cholesterol last visit was:   Lab Results  Component Value Date   CHOL 176 11/01/2019   HDL 34 (L) 11/01/2019   [place]  11/01/2019     Comment:     . LDL cholesterol not calculated. Triglyceride levels greater than 400 mg/dL invalidate  calculated LDL results. . Reference range: <100 . Desirable range <100 mg/dL for primary prevention;   <70 mg/dL for patients with CHD or diabetic patients  with > or = 2 CHD risk  factors. [name] LDL-C is now calculated using the Martin-Hopkins  calculation, which is a validated novel method providing  better accuracy than the Friedewald equation in the  estimation of LDL-C.  Cresenciano Genre et al. Annamaria Helling. [id number]): 2061-2068  (http://education.[employer].com/faq/FAQ164)    TRIG 606 (H) 11/01/2019   CHOLHDL 5.2 (H) 11/01/2019    He has been working on diet and exercise for T2DM on metformin 2000 mg and cinnamon, and denies foot ulcerations, increased appetite, nausea, paresthesia of the feet, polydipsia, polyuria, visual disturbances and vomiting. He has not bee checking blood sugars as his wife threw out his glucometer and supplies. Last A1C in the office was:  Lab Results  Component Value Date   HGBA1C 7.4 (H) 10/08/2019    He has CKD 1 associated with T2DM monitored at this office:  Lab Results  Component Value Date   GFRNONAA 100 11/01/2019   Patient is on Vitamin D supplement.   Lab Results  Component Value Date   VD25OH 42 11/01/2019        Current Medications:  Current Outpatient Medications on File Prior to Visit  Medication Sig  . acetaminophen (TYLENOL) 325 MG tablet Take 2 tablets (650 mg total) by mouth every 4 (four) hours as needed for mild pain ((score 1 to 3) or temp > 100.5).  . bisoprolol-hydrochlorothiazide (ZIAC) 5-6.25 MG tablet Take 1 tablet Daily for BP  . Cholecalciferol (VITAMIN D3 MAXIMUM STRENGTH) 5000 UNITS capsule Take 10,000 Units by mouth daily.   [name] CINNAMON PO Take 1,000 mg by mouth daily.  [name] gabapentin (NEURONTIN) 800 MG tablet Take 1 tablet 3 x /day for Pain  . HYDROcodone-acetaminophen (NORCO/VICODIN) 5-325 MG tablet Take 1 tablet by mouth every 4 (four) hours as needed for moderate pain.  . Magnesium 250 MG TABS Take 250 mg by mouth at bedtime.   . Melatonin 3 MG TABS Take 1 tablet (3 mg total) by mouth at bedtime.  . metFORMIN (GLUCOPHAGE-XR) 500 MG 24 hr tablet Take 1 tablet with Bkfst & Lunch & 2 tablets with Supper  for Diabetes  . methocarbamol (ROBAXIN) 500 MG tablet Take 1 tablet (500 mg total) by mouth every 6 (six) hours as needed for muscle spasms.  . Multiple Vitamin (MULTIVITAMIN WITH MINERALS) TABS Take 1 tablet by mouth daily.  . phentermine (ADIPEX-P) 37.5 MG tablet TAKE 1/2 TO 1 TABLET EVERY MORNING FOR DIETING AND WEIGHT LOSS  . rivaroxaban (XARELTO) 20 MG TABS tablet Take 1 tablet Daily to Prevent Blood Clots  . vitamin C (ASCORBIC ACID) 500 MG tablet Take 1,000 mg by mouth daily.  [name] zinc gluconate 50 MG tablet Take 50 mg by mouth daily.   No current facility-administered medications on file prior to visit.     Allergies:  Allergies  Allergen Reactions  . Ppd [Tuberculin Purified Protein Derivative]     Pt states he is not allergic to this.Marland Kitchenpositive tb test 2014, pt took tb treatment 2014     Medical History:  Past Medical History:  Diagnosis Date  . Diabetes mellitus, type 2 ([place])   . Elevated hemoglobin A1c   . Heart murmur    at birth  . History of kidney stones 2011  . Hypertension   . Low back pain    Radiates to left ankle.  Injured  after falling on back.  . Morbid obesity (BMI 36. 06/07/2015  . 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 Renal disorder   . Tuberculosis    2014 dx, was treated with medications for about 4 months   Family history- Reviewed and unchanged Social history- Reviewed and unchanged   Review of Systems:  Review of Systems  Constitutional: Negative for malaise/fatigue and weight loss.  HENT: Negative for hearing loss and tinnitus.   Eyes: Negative for blurred vision and double vision.  Respiratory: Negative for cough, shortness of breath and wheezing.   Cardiovascular: Negative for chest pain, palpitations, orthopnea, claudication and leg swelling.  Gastrointestinal: Negative for abdominal pain, blood in stool, constipation, diarrhea, heartburn, melena, nausea and vomiting.  Genitourinary:  Negative.   Musculoskeletal: Negative for joint pain and myalgias.  Skin: Negative for rash.  Neurological: Negative for dizziness, tingling, sensory change, weakness and headaches.  Endo/Heme/Allergies: Negative for polydipsia.  Psychiatric/Behavioral: Negative.   All other systems reviewed and are negative.     Physical Exam: There were no vitals taken for this visit. Wt Readings from Last 3 Encounters:  11/01/19 245 lb 9.6 oz (111.4 kg)  10/12/19 250 lb 3.6 oz (113.5 kg)  10/07/19 250 lb (113.4 kg)   General Appearance: Well nourished, in no apparent distress. Eyes: PERRLA, EOMs, conjunctiva no swelling or erythema Sinuses: No Frontal/maxillary tenderness ENT/Mouth: Ext aud canals clear, TMs without erythema, bulging. No erythema, swelling, or exudate on post pharynx.  Tonsils not swollen or erythematous. Hearing normal.  Neck: Supple, thyroid normal.  Respiratory: Respiratory effort normal, BS equal bilaterally without rales, rhonchi, wheezing or stridor.  Cardio: RRR with no MRGs. Brisk peripheral pulses without edema.  Abdomen: Soft, + BS.  Non tender, no guarding, rebound, hernias, masses. Lymphatics: Non tender without lymphadenopathy.  Musculoskeletal: Full ROM, 5/5 strength, Normal gait Skin: Warm, dry without rashes, lesions, ecchymosis.  Neuro: Cranial nerves intact. No cerebellar symptoms.  Psych: Awake and oriented X 3, normal affect, Insight and Judgment appropriate.    Izora Ribas, NP 1:29 PM [facility]

## 2020-02-03 ENCOUNTER — Ambulatory Visit: Payer: BC Managed Care – PPO | Admitting: Adult Health

## 2020-02-03 NOTE — Progress Notes (Signed)
FOLLOW UP  Assessment and Plan:   Hypertension Elevated today; hasn't taken BP med; restart, recheck by [place] in 1-2 weeks Emphasized compliance Monitor blood pressure at home; patient to call if consistently greater than 130/80 Continue DASH diet.   Reminder to go to the ER if any CP, SOB, nausea, dizziness, severe HA, changes vision/speech, left arm numbness and tingling and jaw pain.  Hyperlipidemia associated with T2DM ([place]) Currently above goal; diabetic - add fenofibrate vs statin pending labs today LDL goal <70, last obliterated by trigs Continue low cholesterol diet and exercise.  Check lipid panel.   Diabetes with diabetic chronic kidney disease Continue medication: metformin, cinnamon supplement  Continue diet and exercise.  Perform daily foot/skin check, notify office of any concerning changes.  Glucometer and supplies ordered - advised to restart checking fasting daily  Check A1C  CKD 1 associated with T2DM (HCC) Increase fluids, avoid NSAIDS, monitor sugars, will monitor  Morbid obesity - BMI 35+ with co morbidities Long discussion about weight loss, diet, and exercise Recommended diet heavy in fruits and veggies and low in animal meats, cheeses, and dairy products, appropriate calorie intake Discussed ideal weight for height  Patient on phentermine with benefit and no SE, has been off due to losing script, too early to refill. Continue close follow up. Advised he take a drug break.  Discussed increasing vegetable intake, reduce starches, continue protein Will follow up in 3 months  Vitamin D Def Below goal at last visit; hasn't changed dose continue supplementation to maintain goal of 60-100 Defer Vit D level  Former smoker (41 pack/year history) Quit 09/2019; commended cessation; discussed maintenance strategies -lung cancer screening with low dose CT discussed as recommended by guidelines based on age, number of pack year history.  Discussed risks of screening  including but not limited to false positives on xray, further testing or consultation with specialist, and possible false negative CT as well. Understanding expressed and wishes to consider but defer today.   Acute bil lower extremity DVTs (HCC) Provoked following back surgery; on xarelto via ortho  Poor compliance Emphasized need for compliance; risks; will reorder glucometer; monitor closely   Brown/red urine Intermittent x 5 months per patient;  Check UA/culture; consider Ct vs have him follow up with est urology, hx of renal calculi  Continue diet and meds as discussed. Further disposition pending results of labs. Discussed med's effects and SE's.   Over 30 minutes of exam, counseling, chart review, and critical decision making was performed.   Future Appointments  Date Time Provider [place]  05/04/2020 11:00 AM Unk Pinto, MD [facility] None    ----------------------------------------------------------------------------------------------------------------------  HPI 64 y.o. male  presents for 3 month follow up on hypertension, cholesterol, diabetes with CKD 1, morbid obesity and vitamin D deficiency.   Presented 10/07/2019 with progressive low back pain radiating to the lower extremities.  X-rays and imaging revealed degenerative disc disease with slight scoliosis and radiculopathy L3-4 and 4-5.  Underwent posterior spinal fusion interbody decompression with oblique exposure L3-L5 10/08/2019 per Dr. Rolena Infante. DVT prophylaxis with venous Doppler studies completed 10/12/2019 showing bilateral gastrocnemius DVT initially placed on Lovenox transition to Xarelto, continues with 20 mg daily; patient reports R has resolved, L remained  Has continued with PT twice weekly which does seem to be helping. Pain is siginificantly improved, lower extremity pain has resolved, still has some lower back pain, taking tylenol and applying ice with some benefit. 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  The patient mentions that following his surgery he has noted intermittent brown/red color or urine, intermittent foul odor; denies clots. Hx of kidney stones, last in 2011, had laser surgery. Last episode was 1 day last week.   He is a former smoker, quit 5 months ago prior to surgery, reports has done well without cravings. He has an estimated 41 pack year history. Discussed low dose CT lung cancer screening, would like to pursue later this year  he is prescribed phentermine for weight loss. But has been off for a while after misplacing his bottle while at the grocery store, was 3 month supply. While on the medication they have lost 0 lbs since last visit. They deny palpitations, anxiety, trouble sleeping, elevated BP.   BMI is Body mass index is 37.99 kg/m., he reports exercise is limited other than PT.  Wt Readings from Last 3 Encounters:  02/11/20 261 lb (118.4 kg)  11/01/19 245 lb 9.6 oz (111.4 kg)  10/12/19 250 lb 3.6 oz (113.5 kg)   His blood pressure has been controlled at home, today their BP is BP: (!) 146/78 he reports hasn't taken his BP medication this AM;   He does not workout. He denies chest pain, shortness of breath, dizziness.   He is not on cholesterol medication and denies myalgias. His cholesterol is not at goal. The cholesterol last visit was:   Lab Results  Component Value Date   CHOL 176 11/01/2019   HDL 34 (L) 11/01/2019   [place]  11/01/2019     Comment:     . LDL cholesterol not calculated. Triglyceride levels greater than 400 mg/dL invalidate calculated LDL results. . Reference range: <100 . Desirable range <100 mg/dL for primary prevention;   <70 mg/dL for patients with CHD or diabetic patients  with > or = 2 CHD risk factors. [name] LDL-C is now calculated using the Martin-Hopkins  calculation, which is a validated novel method providing  better accuracy than the Friedewald equation in the  estimation of LDL-C.  Cresenciano Genre et al. Annamaria Helling.  [id number]): [id number]  (http://education.[employer].com/faq/FAQ164)    TRIG 606 (H) 11/01/2019   CHOLHDL 5.2 (H) 11/01/2019    He has been working on diet and exercise for T2DM on metformin 2000 mg and cinnamon, and denies foot ulcerations, increased appetite, nausea, paresthesia of the feet, polydipsia, polyuria, visual disturbances and vomiting. He states has not been checking blood sugars as his bipolar wife threw out his glucometer and supplies. Last A1C in the office was:  Lab Results  Component Value Date   HGBA1C 7.4 (H) 10/08/2019    He has CKD 1 associated with T2DM monitored at this office:  Lab Results  Component Value Date   GFRNONAA 100 11/01/2019   Patient is on Vitamin D supplement.   Lab Results  Component Value Date   VD25OH 42 11/01/2019        Current Medications:  Current Outpatient Medications on File Prior to Visit  Medication Sig  . acetaminophen (TYLENOL) 325 MG tablet Take 2 tablets (650 mg total) by mouth every 4 (four) hours as needed for mild pain ((score 1 to 3) or temp > 100.5).  . bisoprolol-hydrochlorothiazide (ZIAC) 5-6.25 MG tablet Take 1 tablet Daily for BP  . Cholecalciferol (VITAMIN D3 MAXIMUM STRENGTH) 5000 UNITS capsule Take 10,000 Units by mouth daily.   [name] CINNAMON PO Take 1,000 mg by mouth daily.  . Magnesium 250 MG TABS Take 250 mg by mouth at bedtime.   . metFORMIN (  GLUCOPHAGE-XR) 500 MG 24 hr tablet Take 1 tablet with Bkfst & Lunch & 2 tablets with Supper for Diabetes  . Multiple Vitamin (MULTIVITAMIN WITH MINERALS) TABS Take 1 tablet by mouth daily.  . rivaroxaban (XARELTO) 20 MG TABS tablet Take 1 tablet Daily to Prevent Blood Clots  . vitamin C (ASCORBIC ACID) 500 MG tablet Take 1,000 mg by mouth daily.  . phentermine (ADIPEX-P) 37.5 MG tablet TAKE 1/2 TO 1 TABLET EVERY MORNING FOR DIETING AND WEIGHT LOSS (Patient not taking: Reported on 02/11/2020)  . zinc gluconate 50 MG tablet Take 50 mg by mouth daily.   No current  facility-administered medications on file prior to visit.     Allergies:  Allergies  Allergen Reactions  . Ppd [Tuberculin Purified Protein Derivative]     Pt states he is not allergic to this.Marland Kitchenpositive tb test 2014, pt took tb treatment 2014     Medical History:  Past Medical History:  Diagnosis Date  . Diabetes mellitus, type 2 ([place])   . Elevated hemoglobin A1c   . Heart murmur    at birth  . History of kidney stones 2011  . Hypertension   . Low back pain    Radiates to left ankle.  Injured after falling on back.  . Morbid obesity (BMI 36. 06/07/2015  . 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 Renal disorder   . Tuberculosis    2014 dx, was treated with medications for about 4 months   Family history- Reviewed and unchanged Social history- Reviewed and unchanged   Review of Systems:  Review of Systems  Constitutional: Negative for malaise/fatigue and weight loss.  HENT: Negative for hearing loss and tinnitus.   Eyes: Negative for blurred vision and double vision.  Respiratory: Negative for cough, shortness of breath and wheezing.   Cardiovascular: Negative for chest pain, palpitations, orthopnea, claudication and leg swelling.  Gastrointestinal: Negative for abdominal pain, blood in stool, constipation, diarrhea, heartburn, melena, nausea and vomiting.  Genitourinary: Positive for hematuria (with odor, intermittent). Negative for dysuria, flank pain, frequency and urgency.  Musculoskeletal: Positive for back pain. Negative for joint pain and myalgias.  Skin: Negative for rash.  Neurological: Positive for tingling (intermittently in toes). Negative for dizziness, sensory change, weakness and headaches.  Endo/Heme/Allergies: Negative for polydipsia.  Psychiatric/Behavioral: Negative.   All other systems reviewed and are negative.   Physical Exam: BP (!) 146/78   Pulse 69   Temp (!) 97.5 F (36.4 C)   Wt 261 lb  (118.4 kg)   SpO2 97%   BMI 37.99 kg/m  Wt Readings from Last 3 Encounters:  02/11/20 261 lb (118.4 kg)  11/01/19 245 lb 9.6 oz (111.4 kg)  10/12/19 250 lb 3.6 oz (113.5 kg)   General Appearance: Well nourished, morbidly obese male in no apparent distress. Eyes: PERRLA, EOMs, conjunctiva no swelling or erythema Sinuses: No Frontal/maxillary tenderness ENT/Mouth: Ext aud canals clear, TMs without erythema, bulging. Mask in place; oral exam deferred. Hearing normal.  Neck: Supple, thyroid normal.  Respiratory: Respiratory effort normal, BS equal bilaterally without rales, rhonchi, wheezing or stridor.  Cardio: RRR with no MRGs. Brisk peripheral pulses without edema.  Abdomen: Soft, + BS.  Non tender, no guarding, rebound, hernias, masses. Lymphatics: Non tender without lymphadenopathy.  Musculoskeletal: Full ROM, 5/5 strength, Slow steady gait Skin: Warm, dry without rashes, lesions, ecchymosis.  Neuro: Cranial nerves intact. No cerebellar symptoms.  Psych: Awake and oriented X 3, normal  affect, Insight and Judgment appropriate.    Izora Ribas, NP 10:22 AM [facility]

## 2020-02-08 DIAGNOSIS — M545 Low back pain: Secondary | ICD-10-CM | POA: Diagnosis not present

## 2020-02-10 DIAGNOSIS — M545 Low back pain: Secondary | ICD-10-CM | POA: Diagnosis not present

## 2020-02-11 ENCOUNTER — Ambulatory Visit (INDEPENDENT_AMBULATORY_CARE_PROVIDER_SITE_OTHER): Payer: BC Managed Care – PPO | Admitting: Adult Health

## 2020-02-11 ENCOUNTER — Other Ambulatory Visit: Payer: Self-pay

## 2020-02-11 ENCOUNTER — Encounter: Payer: Self-pay | Admitting: Adult Health

## 2020-02-11 VITALS — BP 146/78 | HR 69 | Temp 97.5°F | Wt 261.0 lb

## 2020-02-11 DIAGNOSIS — E559 Vitamin D deficiency, unspecified: Secondary | ICD-10-CM

## 2020-02-11 DIAGNOSIS — E1122 Type 2 diabetes mellitus with diabetic chronic kidney disease: Secondary | ICD-10-CM

## 2020-02-11 DIAGNOSIS — Z91199 Patient's noncompliance with other medical treatment and regimen due to unspecified reason: Secondary | ICD-10-CM

## 2020-02-11 DIAGNOSIS — R82998 Other abnormal findings in urine: Secondary | ICD-10-CM

## 2020-02-11 DIAGNOSIS — E785 Hyperlipidemia, unspecified: Secondary | ICD-10-CM | POA: Diagnosis not present

## 2020-02-11 DIAGNOSIS — I824Z3 Acute embolism and thrombosis of unspecified deep veins of distal lower extremity, bilateral: Secondary | ICD-10-CM | POA: Diagnosis not present

## 2020-02-11 DIAGNOSIS — Z79899 Other long term (current) drug therapy: Secondary | ICD-10-CM | POA: Diagnosis not present

## 2020-02-11 DIAGNOSIS — N181 Chronic kidney disease, stage 1: Secondary | ICD-10-CM

## 2020-02-11 DIAGNOSIS — Z87891 Personal history of nicotine dependence: Secondary | ICD-10-CM

## 2020-02-11 DIAGNOSIS — E1169 Type 2 diabetes mellitus with other specified complication: Secondary | ICD-10-CM

## 2020-02-11 DIAGNOSIS — I10 Essential (primary) hypertension: Secondary | ICD-10-CM | POA: Diagnosis not present

## 2020-02-11 DIAGNOSIS — Z4889 Encounter for other specified surgical aftercare: Secondary | ICD-10-CM | POA: Diagnosis not present

## 2020-02-11 DIAGNOSIS — Z9119 Patient's noncompliance with other medical treatment and regimen: Secondary | ICD-10-CM

## 2020-02-11 MED ORDER — FREESTYLE LITE TEST VI STRP: ORAL_STRIP | 12 refills | Status: AC

## 2020-02-11 MED ORDER — BLOOD GLUCOSE MONITORING SUPPL DEVI: 0 refills | Status: AC

## 2020-02-11 MED ORDER — LANCETS MISC: 11 refills | Status: AC

## 2020-02-11 NOTE — Telephone Encounter (Signed)
Prescription request for Glucometer and supplies.

## 2020-02-11 NOTE — Patient Instructions (Addendum)
Goals    . Blood Pressure < 130/80    . HEMOGLOBIN A1C < 7.0    . LDL CALC < 70    . Weight (lb) < 240 lb (108.9 kg)       Please make sure you are taking your blood pressure medication daily   Please pick up a blood pressure cuff and start checking a few days a week    Will order glucometer - please start checking a fasting (before breakfast) value daily and keep a log to bring with you next visit        Drink 1/2 your body weight in fluid ounces of water daily; drink a tall glass of water 30 min before meals  Don't eat until you're stuffed- listen to your stomach and eat until you are 80% full   Try eating off of a salad plate; wait 10 min after finishing before going back for seconds  Start by eating the vegetables on your plate; aim for 50% of your meals to be fruits or vegetables  Then eat your protein - lean meats (grass fed if possible), fish, beans, nuts in moderation  Eat your carbs/starch last ONLY if you still are hungry. If you can, stop before finishing it all  Avoid sugar and flour - the closer it looks to it's original form in nature, typically the better it is for you  Splurge in moderation - "assign" days when you get to splurge and have the "bad stuff" - I like to follow a 80% - 20% plan- "good" choices 80 % of the time, "bad" choices in moderation 20% of the time  Simple equation is: Calories out > calories in = weight loss - even if you eat the bad stuff, if you limit portions, you will still lose weight       Bad carbs also include fruit juice, alcohol, and sweet tea. These are empty calories that do not signal to your brain that you are full.   Please remember the good carbs are still carbs which convert into sugar. So please measure them out no more than 1/2-1 cup of rice, oatmeal, pasta, and beans  Veggies are however free foods! Pile them on.   Not all fruit is created equal. Please see the list below, the fruit at the bottom is higher in  sugars than the fruit at the top. Please avoid all dried fruits.        Lung Cancer Screening A lung cancer screening is a test that checks for lung cancer. Lung cancer screening is done to look for lung cancer in its very early stages, before it spreads and becomes harder to treat and before symptoms appear. Finding cancer early improves the chances of successful treatment. It may save your life. Should I be screened for lung cancer? You should be screened for lung cancer if all of these apply:  You currently smoke or you have quit smoking within the past 15 years.  You are 76-48 years old. Screening may be recommended up to age 15 depending on your overall health and other factors.  You are in good general health.  You have a smoking history of 1 pack a day for 30 years or 2 packs a day for 15 years. Screening may also be recommended if you are at high risk for the disease. You may be at high risk if:  You have a family history of lung cancer.  You have been exposed to asbestos.  You  have chronic obstructive pulmonary disease (COPD).  You have a history of previous lung cancer. How often should I be screened for lung cancer?  If you are at risk for lung cancer, it is recommended that you are screened once a year. The recommended screening test is a low-dose CT scan. How can I lower my risk of lung cancer? To lower your risk of developing lung cancer:  If you smoke, stop smoking all tobacco products.  Avoid secondhand smoke.  Avoid exposure to radiation.  Avoid exposure to radon gas. Have your home checked for radon regularly.  Avoid things that cause cancer (carcinogens).  Avoid living or working in places with high air pollution. Where to find more information Ask your health care provider about the risks and benefits of screening. More information and resources are available from these organizations:  [place] (ACS): www.cancer.org  American Lung  Association: www.lung.org Contact a health care provider if:  You start to show symptoms of lung cancer, including: ? Coughing that will not go away. ? Wheezing. ? Chest pain. ? Coughing up blood. ? Shortness of breath. ? Weight loss that cannot be explained. ? Constant fatigue. Summary  Lung cancer screening may find lung cancer before symptoms appear. Finding cancer early improves the chances of successful treatment. It may save your life.  If you are at risk for lung cancer, it is recommended that you are screened once a year. The recommended screening test is a low-dose CT scan.  You can make lifestyle changes to lower your risk of lung cancer.  Ask your health care provider about the risks and benefits of screening. This information is not intended to replace advice given to you by your health care provider. Make sure you discuss any questions you have with your health care provider. Document Revised: 03/26/2019 Document Reviewed: 10/23/2016 Elsevier Patient Education  2020 [employer].

## 2020-02-12 ENCOUNTER — Encounter: Payer: Self-pay | Admitting: Adult Health

## 2020-02-12 ENCOUNTER — Other Ambulatory Visit: Payer: Self-pay | Admitting: Adult Health

## 2020-02-12 DIAGNOSIS — R310 Gross hematuria: Secondary | ICD-10-CM

## 2020-02-12 DIAGNOSIS — R319 Hematuria, unspecified: Secondary | ICD-10-CM | POA: Insufficient documentation

## 2020-02-12 DIAGNOSIS — R7989 Other specified abnormal findings of blood chemistry: Secondary | ICD-10-CM | POA: Insufficient documentation

## 2020-02-12 MED ORDER — ROSUVASTATIN CALCIUM 5 MG PO TABS: ORAL_TABLET | ORAL | 0 refills | Status: DC

## 2020-02-13 LAB — CBC WITH DIFFERENTIAL/PLATELET
Absolute Monocytes: 684 cells/uL (ref 200–950)
Basophils Absolute: 61 cells/uL (ref 0–200)
Basophils Relative: 0.8 %
Eosinophils Absolute: 84 cells/uL (ref 15–500)
Eosinophils Relative: 1.1 %
HCT: 46.2 % (ref 38.5–50.0)
Hemoglobin: 15.7 g/dL (ref 13.2–17.1)
Lymphs Abs: 1900 cells/uL (ref 850–3900)
MCH: 30.2 pg (ref 27.0–33.0)
MCHC: 34 g/dL (ref 32.0–36.0)
MCV: 88.8 fL (ref 80.0–100.0)
MPV: 11.2 fL (ref 7.5–12.5)
Monocytes Relative: 9 %
Neutro Abs: 4872 cells/uL (ref 1500–7800)
Neutrophils Relative %: 64.1 %
Platelets: 211 10*3/uL (ref 140–400)
RBC: 5.2 10*6/uL (ref 4.20–5.80)
RDW: 13.4 % (ref 11.0–15.0)
Total Lymphocyte: 25 %
WBC: 7.6 10*3/uL (ref 3.8–10.8)

## 2020-02-13 LAB — MAGNESIUM: Magnesium: 1.8 mg/dL (ref 1.5–2.5)

## 2020-02-13 LAB — HEMOGLOBIN A1C
Hgb A1c MFr Bld: 8.3 % of total Hgb — ABNORMAL HIGH (ref <5.7)
Mean Plasma Glucose: 192 (calc)
eAG (mmol/L): 10.6 (calc)

## 2020-02-13 LAB — COMPLETE METABOLIC PANEL WITH GFR
AG Ratio: 1.9 (calc) (ref 1.0–2.5)
ALT: 50 U/L — ABNORMAL HIGH (ref 9–46)
AST: 28 U/L (ref 10–35)
Albumin: 4.6 g/dL (ref 3.6–5.1)
Alkaline phosphatase (APISO): 82 U/L (ref 35–144)
BUN: 14 mg/dL (ref 7–25)
CO2: 31 mmol/L (ref 20–32)
Calcium: 10.1 mg/dL (ref 8.6–10.3)
Chloride: 101 mmol/L (ref 98–110)
Creat: 0.8 mg/dL (ref 0.70–1.25)
GFR, Est African American: 109 mL/min/{1.73_m2} (ref >=60)
GFR, Est Non African American: 94 mL/min/{1.73_m2} (ref >=60)
Globulin: 2.4 g/dL (calc) (ref 1.9–3.7)
Glucose, Bld: 166 mg/dL — ABNORMAL HIGH (ref 65–99)
Potassium: 4.6 mmol/L (ref 3.5–5.3)
Sodium: 142 mmol/L (ref 135–146)
Total Bilirubin: 0.5 mg/dL (ref 0.2–1.2)
Total Protein: 7 g/dL (ref 6.1–8.1)

## 2020-02-13 LAB — LIPID PANEL
Cholesterol: 170 mg/dL (ref <200)
HDL: 35 mg/dL — ABNORMAL LOW (ref >=40)
LDL Cholesterol (Calc): 86 mg/dL (calc)
Non-HDL Cholesterol (Calc): 135 mg/dL (calc) — ABNORMAL HIGH (ref <130)
Total CHOL/HDL Ratio: 4.9 (calc) (ref <5.0)
Triglycerides: 369 mg/dL — ABNORMAL HIGH (ref <150)

## 2020-02-13 LAB — URINALYSIS W MICROSCOPIC + REFLEX CULTURE
Bacteria, UA: NONE SEEN /HPF
Bilirubin Urine: NEGATIVE
Glucose, UA: NEGATIVE
Hyaline Cast: NONE SEEN /LPF
Ketones, ur: NEGATIVE
Nitrites, Initial: NEGATIVE
Specific Gravity, Urine: 1.02 (ref 1.001–1.03)
Squamous Epithelial / HPF: NONE SEEN /HPF (ref <=5)
pH: 6 (ref 5.0–8.0)

## 2020-02-13 LAB — TSH: TSH: 3.59 mIU/L (ref 0.40–4.50)

## 2020-02-13 LAB — URINE CULTURE
MICRO NUMBER:: 10195312
SPECIMEN QUALITY:: ADEQUATE

## 2020-02-13 LAB — CULTURE INDICATED

## 2020-02-16 DIAGNOSIS — M545 Low back pain: Secondary | ICD-10-CM | POA: Diagnosis not present

## 2020-02-17 ENCOUNTER — Encounter: Payer: Self-pay | Admitting: Adult Health

## 2020-02-18 DIAGNOSIS — M545 Low back pain: Secondary | ICD-10-CM | POA: Diagnosis not present

## 2020-02-21 DIAGNOSIS — M545 Low back pain: Secondary | ICD-10-CM | POA: Diagnosis not present

## 2020-02-25 ENCOUNTER — Ambulatory Visit: Payer: BC Managed Care – PPO

## 2020-03-01 DIAGNOSIS — M545 Low back pain: Secondary | ICD-10-CM | POA: Diagnosis not present

## 2020-03-06 DIAGNOSIS — M545 Low back pain: Secondary | ICD-10-CM | POA: Diagnosis not present

## 2020-03-08 ENCOUNTER — Other Ambulatory Visit: Payer: Self-pay | Admitting: Adult Health

## 2020-03-08 DIAGNOSIS — M545 Low back pain: Secondary | ICD-10-CM | POA: Diagnosis not present

## 2020-03-15 DIAGNOSIS — M545 Low back pain: Secondary | ICD-10-CM | POA: Diagnosis not present

## 2020-03-17 DIAGNOSIS — M545 Low back pain: Secondary | ICD-10-CM | POA: Diagnosis not present

## 2020-03-22 DIAGNOSIS — M545 Low back pain: Secondary | ICD-10-CM | POA: Diagnosis not present

## 2020-03-23 DIAGNOSIS — Z981 Arthrodesis status: Secondary | ICD-10-CM | POA: Diagnosis not present

## 2020-04-03 DIAGNOSIS — M545 Low back pain: Secondary | ICD-10-CM | POA: Diagnosis not present

## 2020-04-06 ENCOUNTER — Other Ambulatory Visit: Payer: Self-pay | Admitting: Internal Medicine

## 2020-05-03 NOTE — Progress Notes (Signed)
COMPELETE PHYSICAL   FOLLOW UP  Assessment and Plan:    Encounter for general adult medical examination with abnormal findings 1 year Reminded patient to schedule overdue diabetic eye exam and forward report Patient expresses intent to get covid 19 vaccine; will defer pneumonia vaccine today   Essential hypertension - continue medications, DASH diet, exercise and monitor at home. Call if greater than 130/80.  -     CBC with Differential/Platelet -     COMPLETE METABOLIC PANEL WITH GFR -     TSH -     Urinalysis, Routine w reflex microscopic -     Microalbumin / creatinine urine ratio -     EKG 12-Lead  Type 2 diabetes mellitus with stage 1 chronic kidney disease, without long-term current use of insulin [facility]) Education: Reviewed 'ABCs' of diabetes management (respective goals in parentheses):  A1C (<7), blood pressure (<130/80), and cholesterol (LDL <70) Reminded Eye Exam yearly and Dental Exam every 6 months, forward report Dietary recommendations Physical Activity recommendations Foot exam completed today  -     Hemoglobin A1c  Mixed hyperlipidemia Continue medications LDL goal <70 Continue low cholesterol diet and exercise.  Check lipid panel.  -     Lipid panel  Medication management -     Magnesium  Vitamin D deficiency -     VITAMIN D 25 Hydroxy (Vit-D Deficiency, Fractures)  Morbid obesity ([place]) Long discussion about weight loss, diet, and exercise Discussed final goal weight and current weight loss goal (240 lb) Patient on phentermine with benefit and no SE, taking drug breaks; continue close follow up. Return in 3 months   Screening PSA (prostate specific antigen) -     PSA  Former smoker (41 pack/year history) Quit 09/2019; commended cessation; discussed maintenance strategies -lung cancer screening with low dose CT discussed as recommended by guidelines based on age, number of pack year history.  Discussed risks of screening including but not limited to  false positives on xray, further testing or consultation with specialist, and possible false negative CT as well. Understanding expressed and wishes to proceed.   Acute bil lower extremity DVTs (HCC) Provoked following back surgery; on xarelto via ortho Has completed 6 months; obtain follow up study for resolution then plan to D/c xarelto  hematuria Was itermittently ongoing for several months since Oct 2020 per patient, gross hematuria has since resolved, Ct was ordered in Feb 2020 but patient cancelled and never completed Ct vs have him follow up with est urology if persistent by UA today, hx of renal calculi.   Continue diet and meds as discussed. Further disposition pending results of labs. Discussed med's effects and SE's.   Over 40 minutes of exam, counseling, chart review, and critical decision making was performed.   Future Appointments  Date Time Provider [place]  08/08/2020  2:30 PM [name], NP [facility] None  11/13/2020  2:30 PM [name], NP [facility] None  05/07/2021  2:00 PM [name], NP [facility] None    ----------------------------------------------------------------------------------------------------------------------  HPI 64 y.o. male  presents for complete CPE. He has Hypertension; Type 2 diabetes mellitus ([place]); Vitamin D deficiency; Hyperlipidemia associated with type 2 diabetes mellitus ([place]); Medication management; Morbid obesity ([place]); FH: hypertension; Former heavy tobacco smoker (41 pack year, quit 2020); Fusion of lumbosacral spine; Lumbar radiculopathy; CKD stage 1 due to type 2 diabetes mellitus ([place]); Lower leg DVT (deep venous thromboembolism), acute, bilateral ([place]); LFT elevation; Hematuria; and Gastroesophageal reflux disease on their problem list.  He is under some stress due to wife having mental break and being committed last year, has been hard on him, states they are getting a long but feels like friends and not  husband and wife. His daughter is very supportive.   Presented 10/07/2019 with progressive low back pain radiating to the lower extremities.  X-rays and imaging revealed degenerative disc disease with slight scoliosis and radiculopathy L3-4 and 4-5.  Underwent posterior spinal fusion interbody decompression with oblique exposure L3-L5 10/08/2019 per Dr. Rolena Infante. DVT prophylaxis with venous Doppler studies completed 10/12/2019 showing bilateral gastrocnemius DVT initially placed on Lovenox transition to Xarelto, continues with 20 mg daily; patient reports R has resolved, L remained on follow up study repeated on 10/18/2019.   Has continued with PT twice weekly which does seem to be helping. Pain is siginificantly improved, lower extremity pain has resolved, still has some lower back pain, taking tylenol and applying ice with some benefit. 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   He reported that following his surgery he had intermittent brown/red color or urine, intermittent foul odor x 5 months; denied clots. Hx of kidney stones, last in 2011, had laser surgery. CT abdomen/pelvis was ordered in feb but patient cancelled, today reports has resolved.   He is a former smoker, Quit 2020. He has an estimated 41 pack year history.  Discussed low dose CT lung cancer screening, would like to pursue later this year once on Medicare.   He is prescribed phentermine for weight loss. While on the medication he has lost 9 lbs since last visit. They deny palpitations, anxiety, trouble sleeping, elevated BP.   BMI is Body mass index is 37.33 kg/m., he is active at work 4 days a week; he admits diet hasn't been very good recently. he reports exercise is limited other than PT.  Wt Readings from Last 3 Encounters:  05/04/20 252 lb 12.8 oz (114.7 kg)  02/11/20 261 lb (118.4 kg)  11/01/19 245 lb 9.6 oz (111.4 kg)   His blood pressure has been controlled at home (130/70s), today their BP is BP: 140/80  He does not  workout. He denies chest pain, shortness of breath, dizziness.   He is on cholesterol medication (rosuvastatin 5 mg once a week) and denies myalgias. His cholesterol is at goal . The cholesterol last visit was:   Lab Results  Component Value Date   CHOL 170 02/11/2020   HDL 35 (L) 02/11/2020   LDLCALC 86 02/11/2020   TRIG 369 (H) 02/11/2020   CHOLHDL 4.9 02/11/2020    He has been working on diet and exercise for T2DM With CKD stage 2  on metformin 2000 mg and cinnamon and denies foot ulcerations, increased appetite, nausea, paresthesia of the feet, polydipsia, polyuria, visual disturbances and vomiting.  Meter: freestyle lite, glucose running 130-160 when checks after he eats Eye exam: 3 years ago, needs to schedule Last A1C in the office was:  Lab Results  Component Value Date   HGBA1C 8.3 (H) 02/11/2020   Lab Results  Component Value Date   GFRNONAA 94 02/11/2020   Patient is on Vitamin D supplement.   Lab Results  Component Value Date   VD25OH 42 11/01/2019     Lab Results  Component Value Date   PSA 0.6 07/15/2019   PSA 0.5 05/01/2018   PSA 0.5 03/19/2017      Current Medications:  Current Outpatient Medications on File Prior to Visit  Medication Sig  . acetaminophen (TYLENOL) 325  MG tablet Take 2 tablets (650 mg total) by mouth every 4 (four) hours as needed for mild pain ((score 1 to 3) or temp > 100.5).  . bisoprolol-hydrochlorothiazide (ZIAC) 5-6.25 MG tablet Take 1 tablet Daily for BP  . Blood Glucose Monitoring Suppl DEVI Use to check blood sugar once daily  . Cholecalciferol (VITAMIN D3 MAXIMUM STRENGTH) 5000 UNITS capsule Take 10,000 Units by mouth daily.   [name] CINNAMON PO Take 1,000 mg by mouth daily.  [name] glucose blood (FREESTYLE LITE) test strip Test blood sugar once daily  . Lancets MISC Test blood sugar once daily.  . metFORMIN (GLUCOPHAGE-XR) 500 MG 24 hr tablet Take 1 tablet with Bkfst & Lunch & 2 tablets with Supper for Diabetes  . Multiple Vitamin  (MULTIVITAMIN WITH MINERALS) TABS Take 1 tablet by mouth daily.  . phentermine (ADIPEX-P) 37.5 MG tablet TAKE 1/2 TO 1 TABLET EVERY MORNING FOR DIETING AND WEIGHT LOSS  . rivaroxaban (XARELTO) 20 MG TABS tablet Take 1 tablet Daily to Prevent  Blood Clots  . rosuvastatin (CRESTOR) 5 MG tablet TAKE 1 TABLET BY MOUTH ONCE A WEEK IN THE EVENINGS ON SUNDAYS  . vitamin C (ASCORBIC ACID) 500 MG tablet Take 1,000 mg by mouth daily.  [name] zinc gluconate 50 MG tablet Take 50 mg by mouth daily.  . Magnesium 250 MG TABS Take 250 mg by mouth at bedtime.    No current facility-administered medications on file prior to visit.    Patient Care Team: Unk Pinto, MD as PCP - General (Internal Medicine)  Immunization History  Administered Date(s) Administered  . Influenza Inj Mdck Quad With Preservative 10/10/2017  . Influenza Split 12/01/2013  . Influenza-Unspecified 10/02/2018, 08/14/2019  . Pneumococcal Polysaccharide-23 10/10/2017  . Td 12/17/2003  . Tdap 02/14/2015   Health Maintenance  Topic Date Due  . Eye exam for diabetics  Never done  . COVID-19 Vaccine (1) Never done  . Pneumonia vaccines (1 of 2 - PCV13) 07/16/2020*  . Flu Shot  07/16/2020  . Hemoglobin A1C  08/10/2020  . Complete foot exam   05/04/2021  . Tetanus Vaccine  02/13/2025  . Colon Cancer Screening  12/18/2028  .  Hepatitis C: One time screening is recommended by Center for Disease Control  (CDC) for  adults born from [birth year] through 1966.   Completed  . HIV Screening  Completed  *Topic was postponed. The date shown is not the original due date.     Colonoscopy Dr. Oletta Lamas 12/2018, benign polyps, next due 12/2028 CXR 12/2015 CT AB 2011 [place] AB 2013 MRI back 2019 Low dose CT lung: ordered today   Tetanus: 2016 Influenza: 2020 Pneumonia vaccine: defer, patient plans to get Covid vaccine Covid 19: patient plans to schedule   Last foot exam: TODAY  Last eye exam: ? 2018, overdue diabetes eye, patient agrees to  schedule  Last dental exam:   Medical History:  Past Medical History:  Diagnosis Date  . Diabetes mellitus, type 2 ([place])   . Elevated hemoglobin A1c   . Heart murmur    at birth  . History of kidney stones 2011  . Hypertension   . Low back pain    Radiates to left ankle.  Injured after falling on back.  . Morbid obesity (BMI 36. 06/07/2015  . 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 Renal disorder   . Tuberculosis    2014 dx, was treated with medications for about 4 months  Allergies Allergies  Allergen Reactions  . Ppd [Tuberculin Purified Protein Derivative]     Pt states he is not allergic to this.Marland Kitchenpositive tb test 2014, pt took tb treatment 2014    SURGICAL HISTORY He  has a past surgical history that includes Hernia repair; Cystoscopy/retrograde/ureteroscopy/stone extraction with basket (5/11); Wisdom tooth extraction (yrs ago); Knee surgery (Right, 11/25/11 and 2015); Cholecystectomy (07/27/2012); Total knee arthroplasty (Right, 06/06/2015); Colonoscopy; Anterior lumbar fusion (N/A, 10/07/2019); and Abdominal exposure (N/A, 10/07/2019). FAMILY HISTORY His family history includes Diabetes in his brother, father, mother, and sister; Hypertension in his mother; Kidney failure in his mother. SOCIAL HISTORY He  reports that he quit smoking about 7 months ago. His smoking use included cigarettes. He started smoking about 42 years ago. He has a 41.00 pack-year smoking history. He quit smokeless tobacco use about 5 years ago. He reports current alcohol use. He reports that he does not use drugs.    Review of Systems  Constitutional: Negative for malaise/fatigue and weight loss.  HENT: Negative for hearing loss and tinnitus.   Eyes: Negative for blurred vision and double vision.  Respiratory: Negative for cough, sputum production, shortness of breath and wheezing.   Cardiovascular: Negative for chest pain, palpitations, orthopnea,  claudication, leg swelling and PND.  Gastrointestinal: Positive for heartburn. Negative for abdominal pain, blood in stool, constipation, diarrhea, melena, nausea and vomiting.  Genitourinary: Negative.   Musculoskeletal: Positive for back pain (chronic lumbar). Negative for falls, joint pain and myalgias.  Skin: Negative for rash.  Neurological: Positive for tingling (intermittent bilateral toe tingling at night ). Negative for dizziness, sensory change, weakness and headaches.  Endo/Heme/Allergies: Negative for polydipsia.  Psychiatric/Behavioral: Negative.  Negative for depression, memory loss, substance abuse and suicidal ideas. The patient is not nervous/anxious and does not have insomnia.   All other systems reviewed and are negative.    Physical Exam: BP 140/80   Pulse 66   Temp (!) 97.5 F (36.4 C)   Ht 5\' 9"  (1.753 m)   Wt 252 lb 12.8 oz (114.7 kg)   SpO2 98%   BMI 37.33 kg/m  Wt Readings from Last 3 Encounters:  05/04/20 252 lb 12.8 oz (114.7 kg)  02/11/20 261 lb (118.4 kg)  11/01/19 245 lb 9.6 oz (111.4 kg)   General Appearance: Well nourished, obese male in no apparent distress. Eyes: PERRLA, EOMs, conjunctiva no swelling or erythema Sinuses: No Frontal/maxillary tenderness ENT/Mouth: Ext aud canals clear, TMs without erythema, bulging. No erythema, swelling, or exudate on post pharynx.  Tonsils not swollen or erythematous. Hearing normal.  Neck: Supple, thyroid normal.  Respiratory: Respiratory effort normal, BS equal bilaterally without rales, rhonchi, wheezing or stridor.  Cardio: RRR with no MRGs. Brisk peripheral pulses without edema.  Abdomen: Soft, + BS.  Non tender, no guarding, rebound, masses. He has mild ventral hernia with bearing down. Well healed R inguinal hernia surgery scar.  Lymphatics: Non tender without lymphadenopathy.  Musculoskeletal: Full ROM, No obvious deformity, 5/5 strength, Normal gait.  Skin: Warm, dry without rashes, lesions, ecchymosis.   Neuro: Cranial nerves intact. No cerebellar symptoms. Sensation bilateral feet intact to monofilament.  Psych: Awake and oriented X 3, normal affect, Insight and Judgment appropriate.  GU: denies concerns, declines  EKG: Sinus rhythm, LAFB, no ST changes.   Izora Ribas, NP 5:39 PM [facility]

## 2020-05-04 ENCOUNTER — Encounter: Payer: Self-pay | Admitting: Adult Health

## 2020-05-04 ENCOUNTER — Ambulatory Visit (INDEPENDENT_AMBULATORY_CARE_PROVIDER_SITE_OTHER): Payer: BC Managed Care – PPO | Admitting: Adult Health

## 2020-05-04 ENCOUNTER — Encounter: Payer: BC Managed Care – PPO | Admitting: Internal Medicine

## 2020-05-04 ENCOUNTER — Other Ambulatory Visit: Payer: Self-pay

## 2020-05-04 VITALS — BP 140/80 | HR 66 | Temp 97.5°F | Ht 69.0 in | Wt 252.8 lb

## 2020-05-04 DIAGNOSIS — Z1329 Encounter for screening for other suspected endocrine disorder: Secondary | ICD-10-CM

## 2020-05-04 DIAGNOSIS — Z13220 Encounter for screening for lipoid disorders: Secondary | ICD-10-CM | POA: Diagnosis not present

## 2020-05-04 DIAGNOSIS — Z79899 Other long term (current) drug therapy: Secondary | ICD-10-CM | POA: Diagnosis not present

## 2020-05-04 DIAGNOSIS — R350 Frequency of micturition: Secondary | ICD-10-CM

## 2020-05-04 DIAGNOSIS — Z87891 Personal history of nicotine dependence: Secondary | ICD-10-CM

## 2020-05-04 DIAGNOSIS — N181 Chronic kidney disease, stage 1: Secondary | ICD-10-CM

## 2020-05-04 DIAGNOSIS — I10 Essential (primary) hypertension: Secondary | ICD-10-CM

## 2020-05-04 DIAGNOSIS — K219 Gastro-esophageal reflux disease without esophagitis: Secondary | ICD-10-CM | POA: Insufficient documentation

## 2020-05-04 DIAGNOSIS — Z125 Encounter for screening for malignant neoplasm of prostate: Secondary | ICD-10-CM | POA: Diagnosis not present

## 2020-05-04 DIAGNOSIS — N401 Enlarged prostate with lower urinary tract symptoms: Secondary | ICD-10-CM

## 2020-05-04 DIAGNOSIS — E1169 Type 2 diabetes mellitus with other specified complication: Secondary | ICD-10-CM

## 2020-05-04 DIAGNOSIS — Z136 Encounter for screening for cardiovascular disorders: Secondary | ICD-10-CM | POA: Diagnosis not present

## 2020-05-04 DIAGNOSIS — R319 Hematuria, unspecified: Secondary | ICD-10-CM

## 2020-05-04 DIAGNOSIS — I824Z3 Acute embolism and thrombosis of unspecified deep veins of distal lower extremity, bilateral: Secondary | ICD-10-CM

## 2020-05-04 DIAGNOSIS — Z1389 Encounter for screening for other disorder: Secondary | ICD-10-CM | POA: Diagnosis not present

## 2020-05-04 DIAGNOSIS — Z0001 Encounter for general adult medical examination with abnormal findings: Secondary | ICD-10-CM

## 2020-05-04 DIAGNOSIS — Z0000 Encounter for general adult medical examination without abnormal findings: Secondary | ICD-10-CM

## 2020-05-04 DIAGNOSIS — Z131 Encounter for screening for diabetes mellitus: Secondary | ICD-10-CM

## 2020-05-04 DIAGNOSIS — E559 Vitamin D deficiency, unspecified: Secondary | ICD-10-CM | POA: Diagnosis not present

## 2020-05-04 DIAGNOSIS — R7989 Other specified abnormal findings of blood chemistry: Secondary | ICD-10-CM

## 2020-05-04 DIAGNOSIS — Z122 Encounter for screening for malignant neoplasm of respiratory organs: Secondary | ICD-10-CM

## 2020-05-04 MED ORDER — FAMOTIDINE 20 MG PO TABS
20.0000 mg | ORAL_TABLET | Freq: Two times a day (BID) | ORAL | 2 refills | Status: DC | PRN
Start: 2020-05-04 — End: 2022-06-08

## 2020-05-04 NOTE — Patient Instructions (Addendum)
Aaron Burns , Thank you for taking time to come for your Medicare Wellness Visit. I appreciate your ongoing commitment to your health goals. Please review the following plan we discussed and let me know if I can assist you in the future.   These are the goals we discussed: Goals    . Blood Pressure < 130/80    . HEMOGLOBIN A1C < 7.0    . LDL CALC < 70    . Weight (lb) < 240 lb (108.9 kg)       This is a list of the screening recommended for you and due dates:  Health Maintenance  Topic Date Due  . Eye exam for diabetics  Never done  . COVID-19 Vaccine (1) Never done  . Complete foot exam   05/02/2019  . Pneumonia vaccines (1 of 2 - PCV13) 12/17/2019  . Flu Shot  07/16/2020  . Hemoglobin A1C  08/10/2020  . Tetanus Vaccine  02/13/2025  . Colon Cancer Screening  12/18/2028  .  Hepatitis C: One time screening is recommended by Center for Disease Control  (CDC) for  adults born from [birth year] through 1966.   Completed  . HIV Screening  Completed    PLEASE SCHEDULE DIABETES EYE APPOINTMENT ASAP!!!   Please schedule covid 19 vaccine     Know what a healthy weight is for you (roughly BMI <25) and aim to maintain this  Aim for 7+ servings of fruits and vegetables daily  65-80+ fluid ounces of water or unsweet tea for healthy kidneys  Limit to max 1 drink of alcohol per day; avoid smoking/tobacco  Limit animal fats in diet for cholesterol and heart health - choose grass fed whenever available  Avoid highly processed foods, and foods high in saturated/trans fats  Aim for low stress - take time to unwind and care for your mental health  Aim for 150 min of moderate intensity exercise weekly for heart health, and weights twice weekly for bone health  Aim for 7-9 hours of sleep daily        High-Fiber Diet Fiber, also called dietary fiber, is a type of carbohydrate that is found in fruits, vegetables, whole grains, and beans. A high-fiber diet can have many health benefits. Your  health care provider may recommend a high-fiber diet to help:  Prevent constipation. Fiber can make your bowel movements more regular.  Lower your cholesterol.  Relieve the following conditions: ? Swelling of veins in the anus (hemorrhoids). ? Swelling and irritation (inflammation) of specific areas of the digestive tract (uncomplicated diverticulosis). ? A problem of the large intestine (colon) that sometimes causes pain and diarrhea (irritable bowel syndrome, IBS).  Prevent overeating as part of a weight-loss plan.  Prevent heart disease, type 2 diabetes, and certain cancers. What is my plan? The recommended daily fiber intake in grams (g) includes:  38 g for men age 40 or younger.  30 g for men over age 58.  27 g for women age 74 or younger.  21 g for women over age 71. You can get the recommended daily intake of dietary fiber by:  Eating a variety of fruits, vegetables, grains, and beans.  Taking a fiber supplement, if it is not possible to get enough fiber through your diet. What do I need to know about a high-fiber diet?  It is better to get fiber through food sources rather than from fiber supplements. There is not a lot of research about how effective supplements are.  Always  check the fiber content on the nutrition facts label of any prepackaged food. Look for foods that contain 5 g of fiber or more per serving.  Talk with a diet and nutrition specialist (dietitian) if you have questions about specific foods that are recommended or not recommended for your medical condition, especially if those foods are not listed below.  Gradually increase how much fiber you consume. If you increase your intake of dietary fiber too quickly, you may have bloating, cramping, or gas.  Drink plenty of water. Water helps you to digest fiber. What are tips for following this plan?  Eat a wide variety of high-fiber foods.  Make sure that half of the grains that you eat each day are  whole grains.  Eat breads and cereals that are made with whole-grain flour instead of refined flour or white flour.  Eat brown rice, bulgur wheat, or millet instead of white rice.  Start the day with a breakfast that is high in fiber, such as a cereal that contains 5 g of fiber or more per serving.  Use beans in place of meat in soups, salads, and pasta dishes.  Eat high-fiber snacks, such as berries, raw vegetables, nuts, and popcorn.  Choose whole fruits and vegetables instead of processed forms like juice or sauce. What foods can I eat?  Fruits Berries. Pears. Apples. Oranges. Avocado. Prunes and raisins. Dried figs. Vegetables Sweet potatoes. Spinach. Kale. Artichokes. Cabbage. Broccoli. Cauliflower. Green peas. Carrots. Squash. Grains Whole-grain breads. Multigrain cereal. Oats and oatmeal. Brown rice. Barley. Bulgur wheat. [place]. Quinoa. Bran muffins. Popcorn. Rye wafer crackers. Meats and other proteins Navy, kidney, and pinto beans. Soybeans. Split peas. Lentils. Nuts and seeds. Dairy Fiber-fortified yogurt. Beverages Fiber-fortified soy milk. Fiber-fortified orange juice. Other foods Fiber bars. The items listed above may not be a complete list of recommended foods and beverages. Contact a dietitian for more options. What foods are not recommended? Fruits Fruit juice. Cooked, strained fruit. Vegetables Fried potatoes. Canned vegetables. Well-cooked vegetables. Grains White bread. Pasta made with refined flour. White rice. Meats and other proteins Fatty cuts of meat. Fried chicken or fried fish. Dairy Milk. Yogurt. Cream cheese. Sour cream. Fats and oils Butters. Beverages Soft drinks. Other foods Cakes and pastries. The items listed above may not be a complete list of foods and beverages to avoid. Contact a dietitian for more information. Summary  Fiber is a type of carbohydrate. It is found in fruits, vegetables, whole grains, and beans.  There are many  health benefits of eating a high-fiber diet, such as preventing constipation, lowering blood cholesterol, helping with weight loss, and reducing your risk of heart disease, diabetes, and certain cancers.  Gradually increase your intake of fiber. Increasing too fast can result in cramping, bloating, and gas. Drink plenty of water while you increase your fiber.  The best sources of fiber include whole fruits and vegetables, whole grains, nuts, seeds, and beans. This information is not intended to replace advice given to you by your health care provider. Make sure you discuss any questions you have with your health care provider. Document Revised: 10/06/2017 Document Reviewed: 10/06/2017 Elsevier Patient Education  2020 [employer].

## 2020-05-05 ENCOUNTER — Other Ambulatory Visit: Payer: Self-pay | Admitting: Adult Health

## 2020-05-05 DIAGNOSIS — R319 Hematuria, unspecified: Secondary | ICD-10-CM

## 2020-05-05 DIAGNOSIS — E1121 Type 2 diabetes mellitus with diabetic nephropathy: Secondary | ICD-10-CM

## 2020-05-05 DIAGNOSIS — R7989 Other specified abnormal findings of blood chemistry: Secondary | ICD-10-CM

## 2020-05-05 DIAGNOSIS — N289 Disorder of kidney and ureter, unspecified: Secondary | ICD-10-CM

## 2020-05-05 LAB — COMPLETE METABOLIC PANEL WITH GFR
AG Ratio: 1.7 (calc) (ref 1.0–2.5)
ALT: 59 U/L — ABNORMAL HIGH (ref 9–46)
AST: 37 U/L — ABNORMAL HIGH (ref 10–35)
Albumin: 4.7 g/dL (ref 3.6–5.1)
Alkaline phosphatase (APISO): 84 U/L (ref 35–144)
BUN: 21 mg/dL (ref 7–25)
CO2: 30 mmol/L (ref 20–32)
Calcium: 10.6 mg/dL — ABNORMAL HIGH (ref 8.6–10.3)
Chloride: 99 mmol/L (ref 98–110)
Creat: 1.06 mg/dL (ref 0.70–1.25)
GFR, Est African American: 85 mL/min/{1.73_m2} (ref >=60)
GFR, Est Non African American: 73 mL/min/{1.73_m2} (ref >=60)
Globulin: 2.7 g/dL (calc) (ref 1.9–3.7)
Glucose, Bld: 191 mg/dL — ABNORMAL HIGH (ref 65–99)
Potassium: 4.5 mmol/L (ref 3.5–5.3)
Sodium: 138 mmol/L (ref 135–146)
Total Bilirubin: 0.5 mg/dL (ref 0.2–1.2)
Total Protein: 7.4 g/dL (ref 6.1–8.1)

## 2020-05-05 LAB — LIPID PANEL
Cholesterol: 149 mg/dL (ref <200)
HDL: 36 mg/dL — ABNORMAL LOW (ref >=40)
Non-HDL Cholesterol (Calc): 113 mg/dL (calc) (ref <130)
Total CHOL/HDL Ratio: 4.1 (calc) (ref <5.0)
Triglycerides: 586 mg/dL — ABNORMAL HIGH (ref <150)

## 2020-05-05 LAB — CBC WITH DIFFERENTIAL/PLATELET
Absolute Monocytes: 1008 cells/uL — ABNORMAL HIGH (ref 200–950)
Basophils Absolute: 48 cells/uL (ref 0–200)
Basophils Relative: 0.5 %
Eosinophils Absolute: 77 cells/uL (ref 15–500)
Eosinophils Relative: 0.8 %
HCT: 47.3 % (ref 38.5–50.0)
Hemoglobin: 15.8 g/dL (ref 13.2–17.1)
Lymphs Abs: 2381 cells/uL (ref 850–3900)
MCH: 29.9 pg (ref 27.0–33.0)
MCHC: 33.4 g/dL (ref 32.0–36.0)
MCV: 89.6 fL (ref 80.0–100.0)
MPV: 10.8 fL (ref 7.5–12.5)
Monocytes Relative: 10.5 %
Neutro Abs: 6086 cells/uL (ref 1500–7800)
Neutrophils Relative %: 63.4 %
Platelets: 220 10*3/uL (ref 140–400)
RBC: 5.28 10*6/uL (ref 4.20–5.80)
RDW: 13.2 % (ref 11.0–15.0)
Total Lymphocyte: 24.8 %
WBC: 9.6 10*3/uL (ref 3.8–10.8)

## 2020-05-05 LAB — MICROALBUMIN / CREATININE URINE RATIO
Creatinine, Urine: 115 mg/dL (ref 20–320)
Microalb Creat Ratio: 59 mcg/mg creat — ABNORMAL HIGH (ref <30)
Microalb, Ur: 6.8 mg/dL

## 2020-05-05 LAB — MAGNESIUM: Magnesium: 1.7 mg/dL (ref 1.5–2.5)

## 2020-05-05 LAB — URINALYSIS, ROUTINE W REFLEX MICROSCOPIC
Bacteria, UA: NONE SEEN /HPF
Bilirubin Urine: NEGATIVE
Glucose, UA: NEGATIVE
Hgb urine dipstick: NEGATIVE
Hyaline Cast: NONE SEEN /LPF
Ketones, ur: NEGATIVE
Nitrite: NEGATIVE
Specific Gravity, Urine: 1.022 (ref 1.001–1.03)
Squamous Epithelial / HPF: NONE SEEN /HPF (ref <=5)
pH: <=5 (ref 5.0–8.0)

## 2020-05-05 LAB — TSH: TSH: 4.2 mIU/L (ref 0.40–4.50)

## 2020-05-05 LAB — VITAMIN D 25 HYDROXY (VIT D DEFICIENCY, FRACTURES): Vit D, 25-Hydroxy: 54 ng/mL (ref 30–100)

## 2020-05-05 LAB — HEMOGLOBIN A1C
Hgb A1c MFr Bld: 8.6 % of total Hgb — ABNORMAL HIGH (ref <5.7)
Mean Plasma Glucose: 200 (calc)
eAG (mmol/L): 11.1 (calc)

## 2020-05-05 LAB — PSA: PSA: 0.6 ng/mL (ref <=4.0)

## 2020-05-05 MED ORDER — FENOFIBRATE 145 MG PO TABS
145.0000 mg | ORAL_TABLET | Freq: Every day | ORAL | 1 refills | Status: DC
Start: 2020-05-05 — End: 2020-10-19

## 2020-05-05 MED ORDER — GLIPIZIDE 5 MG PO TABS: 2.5000 mg | ORAL_TABLET | Freq: Two times a day (BID) | ORAL | 0 refills | Status: DC

## 2020-05-10 ENCOUNTER — Ambulatory Visit
Admission: RE | Admit: 2020-05-10 | Discharge: 2020-05-10 | Disposition: A | Discharge status: Home / Self Care | Payer: BC Managed Care – PPO | Source: Ambulatory Visit | Attending: Adult Health | Admitting: Adult Health

## 2020-05-10 DIAGNOSIS — N289 Disorder of kidney and ureter, unspecified: Secondary | ICD-10-CM

## 2020-05-10 DIAGNOSIS — R319 Hematuria, unspecified: Secondary | ICD-10-CM

## 2020-05-10 DIAGNOSIS — K760 Fatty (change of) liver, not elsewhere classified: Secondary | ICD-10-CM | POA: Diagnosis not present

## 2020-05-10 DIAGNOSIS — R7989 Other specified abnormal findings of blood chemistry: Secondary | ICD-10-CM

## 2020-05-11 ENCOUNTER — Encounter: Payer: Self-pay | Admitting: Adult Health

## 2020-05-11 ENCOUNTER — Other Ambulatory Visit: Payer: Self-pay | Admitting: Adult Health

## 2020-05-11 ENCOUNTER — Other Ambulatory Visit: Payer: Self-pay

## 2020-05-11 ENCOUNTER — Ambulatory Visit (HOSPITAL_COMMUNITY)
Admission: RE | Admit: 2020-05-11 | Discharge: 2020-05-11 | Disposition: A | Discharge status: Home / Self Care | Payer: BC Managed Care – PPO | Source: Ambulatory Visit | Attending: Cardiology | Admitting: Cardiology

## 2020-05-11 DIAGNOSIS — R160 Hepatomegaly, not elsewhere classified: Secondary | ICD-10-CM

## 2020-05-11 DIAGNOSIS — I824Z3 Acute embolism and thrombosis of unspecified deep veins of distal lower extremity, bilateral: Secondary | ICD-10-CM | POA: Insufficient documentation

## 2020-05-11 DIAGNOSIS — D7389 Other diseases of spleen: Secondary | ICD-10-CM | POA: Insufficient documentation

## 2020-05-11 DIAGNOSIS — K760 Fatty (change of) liver, not elsewhere classified: Secondary | ICD-10-CM | POA: Insufficient documentation

## 2020-05-16 ENCOUNTER — Encounter: Payer: Self-pay | Admitting: Adult Health

## 2020-05-16 ENCOUNTER — Other Ambulatory Visit: Payer: Self-pay | Admitting: Adult Health

## 2020-05-16 DIAGNOSIS — I82502 Chronic embolism and thrombosis of unspecified deep veins of left lower extremity: Secondary | ICD-10-CM

## 2020-05-16 HISTORY — DX: Chronic embolism and thrombosis of unspecified deep veins of left lower extremity: I82.502

## 2020-05-31 ENCOUNTER — Other Ambulatory Visit: Payer: Self-pay | Admitting: Adult Health

## 2020-06-07 NOTE — Progress Notes (Signed)
Assessment and Plan:  Aaron Burns was seen today for follow-up.  Diagnoses and all orders for this visit:  Type 2 diabetes mellitus with stage 1 chronic kidney disease, without long-term current use of insulin (HCC) Continue metformin, increase glipizide to BID (1/2 tab then whole tab if needed) He agrees needs to do better with diet Goal for slow titration for fasting <130 Consider benefit from SGLT-2 or GLP-1 agent; he will check formulary now that on new insurance Keep close glucose log and follow up 1 month -   Morbid obesity ([place]) Reduce carb intake, portions Long discussion about weight loss, diet, and exercise Discussed final goal weight  Patient on phentermine with benefit and no SE, taking drug breaks; continue close follow up.   Essential hypertension CKD stage 1 due to type 2 diabetes mellitus ([place]) Microalbuminuric diabetic nephropathy (HCC) Taper down ziac, start hyzaar for BP and CKD/microalbuminuria Monitor blood pressure at home; call if consistently over 130/80 Continue DASH diet.   Reminder to go to the ER if any CP, SOB, nausea, dizziness, severe HA, changes vision/speech, left arm numbness and tingling and jaw pain. Follow up 1 month and check BMP/GFR, mag -     losartan-hydrochlorothiazide (HYZAAR) 100-25 MG tablet; Take 1 tablet by mouth daily.  Further disposition pending results of labs. Discussed med's effects and SE's.   Over 30 minutes of exam, counseling, chart review, and critical decision making was performed.   Future Appointments  Date Time Provider [place]  07/06/2020 11:00 AM [name], NP [facility] None  08/08/2020  2:30 PM [name], NP [facility] None  11/13/2020  2:30 PM [name], NP [facility] None  05/07/2021  2:00 PM [name], NP [facility] None    ------------------------------------------------------------------------------------------------------------------  HPI \\BP  (!) 156/80   Pulse 61   Temp (!)  97.3 F (36.3 C)   Wt 256 lb (116.1 kg)   SpO2 98%   BMI 37.80 kg/m   64 y.o.male with T2DM, CKD, morbid obesity presents for 1 month follow up on glucose and CKD after initiation of glipizide.   A1C was up to 8.6  Sent in a medication - glipizide 5 mg - 1/2 tab BID was prescribed - patient states has been taking 5 mg daily with breakfast, not taking in the evening may benefit from SGLT2 but pending transition to medicare due to cost concerns  Goal is <130. You can back off on this medication is starting to see lower sugars consistently <100. Please increase to whole tab if remains 130+ in 2 weeks.  Please keep a log of fasting sugars and how much glipizide you've taken and I'd like to see you back in 1 month to review sugars and also recheck kidney functions.  Fasting over last month was 140-209 PM glucose prior to dinner 110-204    He has been working on diet and exercise for T2 diabetes, and denies hypoglycemia , increased appetite, nausea, paresthesia of the feet, polydipsia, polyuria and visual disturbances. Last A1C in the office was:  Lab Results  Component Value Date   HGBA1C 8.6 (H) 05/04/2020   His blood pressure has not been controlled at home (130-150s/80s), today their BP is BP: (!) 156/80 Has been on ziac 5/6.25, increased to 2 tabs after last visit and remains above goal  Had discussed ACE/ARB at last visit due to T2DM and CKD with microalbuminuria, he had declined at that time but today is agreeable to switch  He does not workout. He denies chest pain,  shortness of breath, dizziness.  Lab Results  Component Value Date   CWCBJSEG 31 05/04/2020   Lab Results  Component Value Date   CREATININE 1.06 05/04/2020   Lab Results  Component Value Date   MICROALBUR 6.8 05/04/2020   MICROALBUR 4.5 07/15/2019   he is prescribed phentermine for weight loss.  While on the medication they have lost 0 lbs since last visit. They deny palpitations, anxiety, trouble sleeping,  elevated BP. He reports was up to 259 lb on home scale, has lost 3-4 lb since a few weeks ago. Initial weight gain with starting glipizide. Admits to drinking juice, having cake, sugary cereal - knows needs to do better.   BMI is Body mass index is 37.8 kg/m.,  Wt Readings from Last 3 Encounters:  06/08/20 256 lb (116.1 kg)  05/04/20 252 lb 12.8 oz (114.7 kg)  02/11/20 261 lb (118.4 kg)    Past Medical History:  Diagnosis Date  . Diabetes mellitus, type 2 ([place])   . Elevated hemoglobin A1c   . Heart murmur    at birth  . History of kidney stones 2011  . Hypertension   . Low back pain    Radiates to left ankle.  Injured after falling on back.  . Morbid obesity (BMI 36. 06/07/2015  . 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 Renal disorder   . Tuberculosis    2014 dx, was treated with medications for about 4 months     Allergies  Allergen Reactions  . Ppd [Tuberculin Purified Protein Derivative]     Pt states he is not allergic to this.Marland Kitchenpositive tb test 2014, pt took tb treatment 2014    Current Outpatient Medications on File Prior to Visit  Medication Sig  . acetaminophen (TYLENOL) 325 MG tablet Take 2 tablets (650 mg total) by mouth every 4 (four) hours as needed for mild pain ((score 1 to 3) or temp > 100.5).  [name] Blood Glucose Monitoring Suppl DEVI Use to check blood sugar once daily  . Cholecalciferol (VITAMIN D3 MAXIMUM STRENGTH) 5000 UNITS capsule Take 10,000 Units by mouth daily.   [name] CINNAMON PO Take 1,000 mg by mouth daily.  . famotidine (PEPCID) 20 MG tablet Take 1 tablet (20 mg total) by mouth 2 (two) times daily as needed for heartburn or indigestion.  . fenofibrate (TRICOR) 145 MG tablet Take 1 tablet (145 mg total) by mouth daily.  [name] glipiZIDE (GLUCOTROL) 5 MG tablet Take 1/2 to 1 tablet   2 x/day with Meals for Diabetes  . glucose blood (FREESTYLE LITE) test strip Test blood sugar once daily  . Lancets MISC Test  blood sugar once daily.  . Magnesium 250 MG TABS Take 250 mg by mouth at bedtime.   . metFORMIN (GLUCOPHAGE-XR) 500 MG 24 hr tablet Take 1 tablet with Bkfst & Lunch & 2 tablets with Supper for Diabetes  . Multiple Vitamin (MULTIVITAMIN WITH MINERALS) TABS Take 1 tablet by mouth daily.  . phentermine (ADIPEX-P) 37.5 MG tablet TAKE 1/2 TO 1 TABLET EVERY MORNING FOR DIETING AND WEIGHT LOSS  . rivaroxaban (XARELTO) 20 MG TABS tablet Take 1 tablet Daily to Prevent  Blood Clots  . rosuvastatin (CRESTOR) 5 MG tablet TAKE 1 TABLET BY MOUTH ONCE A WEEK IN THE EVENINGS ON SUNDAYS  . vitamin C (ASCORBIC ACID) 500 MG tablet Take 1,000 mg by mouth daily.  [name] zinc gluconate 50 MG tablet Take 50 mg by mouth daily.  No current facility-administered medications on file prior to visit.    ROS: all negative except above.   Physical Exam:  BP (!) 156/80   Pulse 61   Temp (!) 97.3 F (36.3 C)   Wt 256 lb (116.1 kg)   SpO2 98%   BMI 37.80 kg/m   General Appearance: Well nourished, morbidly obese male in no apparent distress. Eyes: PERRLA, EOMs, conjunctiva no swelling or erythema ENT/Mouth: No erythema, swelling, or exudate on post pharynx.  Tonsils not swollen or erythematous. Hearing normal.  Neck: Supple, thyroid normal.  Respiratory: Respiratory effort normal, BS equal bilaterally without rales, rhonchi, wheezing or stridor.  Cardio: RRR with no MRGs. Brisk peripheral pulses without edema.  Abdomen: Soft, obese abdomen, + BS.  Non tender, no guarding, rebound, hernias, masses. Lymphatics: Non tender without lymphadenopathy.  Musculoskeletal: Full ROM, 5/5 strength, normal gait.  Skin: Warm, dry without rashes, lesions, ecchymosis.  Neuro: Normal muscle tone, Sensation intact.  Psych: Awake and oriented X 3, normal affect, Insight and Judgment appropriate.     Izora Ribas, NP 5:02 PM [facility]

## 2020-06-08 ENCOUNTER — Other Ambulatory Visit: Payer: Self-pay

## 2020-06-08 ENCOUNTER — Encounter: Payer: Self-pay | Admitting: Adult Health

## 2020-06-08 ENCOUNTER — Ambulatory Visit (INDEPENDENT_AMBULATORY_CARE_PROVIDER_SITE_OTHER): Payer: PPO | Admitting: Adult Health

## 2020-06-08 VITALS — BP 156/80 | HR 61 | Temp 97.3°F | Wt 256.0 lb

## 2020-06-08 DIAGNOSIS — I10 Essential (primary) hypertension: Secondary | ICD-10-CM | POA: Diagnosis not present

## 2020-06-08 DIAGNOSIS — E1122 Type 2 diabetes mellitus with diabetic chronic kidney disease: Secondary | ICD-10-CM

## 2020-06-08 DIAGNOSIS — E1121 Type 2 diabetes mellitus with diabetic nephropathy: Secondary | ICD-10-CM

## 2020-06-08 DIAGNOSIS — N181 Chronic kidney disease, stage 1: Secondary | ICD-10-CM

## 2020-06-08 MED ORDER — LOSARTAN POTASSIUM-HCTZ 100-25 MG PO TABS: 1.0000 | ORAL_TABLET | Freq: Every day | ORAL | 1 refills | Status: DC

## 2020-06-08 NOTE — Patient Instructions (Addendum)
Goals    . Blood Pressure < 130/80    . HEMOGLOBIN A1C < 7.0    . LDL CALC < 70    . Weight (lb) < 240 lb (108.9 kg)       Please look up your formulary with new insurance    Blood sugar   Start taking 1/2 tab of glipizide with dinner for 1-2 weeks - if morning sugar is still consistently above 130, increase to whole tab   Work on cutting down on sugars/carb intake - reduce juice, sugary cereal, etc   Blood pressure   Cut back down to 1 tab of bisoprolol/hydrochlorothiazide  Start 1/2 tab of new blood pressure, then increase to whole tab once off of old blood pressure medication      High-Fiber Diet Fiber, also called dietary fiber, is a type of carbohydrate that is found in fruits, vegetables, whole grains, and beans. A high-fiber diet can have many health benefits. Your health care provider may recommend a high-fiber diet to help:  Prevent constipation. Fiber can make your bowel movements more regular.  Lower your cholesterol.  Relieve the following conditions: ? Swelling of veins in the anus (hemorrhoids). ? Swelling and irritation (inflammation) of specific areas of the digestive tract (uncomplicated diverticulosis). ? A problem of the large intestine (colon) that sometimes causes pain and diarrhea (irritable bowel syndrome, IBS).  Prevent overeating as part of a weight-loss plan.  Prevent heart disease, type 2 diabetes, and certain cancers. What is my plan? The recommended daily fiber intake in grams (g) includes:  38 g for men age 58 or younger.  30 g for men over age 62.  81 g for women age 30 or younger.  21 g for women over age 74. You can get the recommended daily intake of dietary fiber by:  Eating a variety of fruits, vegetables, grains, and beans.  Taking a fiber supplement, if it is not possible to get enough fiber through your diet. What do I need to know about a high-fiber diet?  It is better to get fiber through food sources rather than from  fiber supplements. There is not a lot of research about how effective supplements are.  Always check the fiber content on the nutrition facts label of any prepackaged food. Look for foods that contain 5 g of fiber or more per serving.  Talk with a diet and nutrition specialist (dietitian) if you have questions about specific foods that are recommended or not recommended for your medical condition, especially if those foods are not listed below.  Gradually increase how much fiber you consume. If you increase your intake of dietary fiber too quickly, you may have bloating, cramping, or gas.  Drink plenty of water. Water helps you to digest fiber. What are tips for following this plan?  Eat a wide variety of high-fiber foods.  Make sure that half of the grains that you eat each day are whole grains.  Eat breads and cereals that are made with whole-grain flour instead of refined flour or white flour.  Eat brown rice, bulgur wheat, or millet instead of white rice.  Start the day with a breakfast that is high in fiber, such as a cereal that contains 5 g of fiber or more per serving.  Use beans in place of meat in soups, salads, and pasta dishes.  Eat high-fiber snacks, such as berries, raw vegetables, nuts, and popcorn.  Choose whole fruits and vegetables instead of processed forms like juice  or sauce. What foods can I eat?  Fruits Berries. Pears. Apples. Oranges. Avocado. Prunes and raisins. Dried figs. Vegetables Sweet potatoes. Spinach. Kale. Artichokes. Cabbage. Broccoli. Cauliflower. Green peas. Carrots. Squash. Grains Whole-grain breads. Multigrain cereal. Oats and oatmeal. Brown rice. Barley. Bulgur wheat. [place]. Quinoa. Bran muffins. Popcorn. Rye wafer crackers. Meats and other proteins [place], kidney, and pinto beans. Soybeans. Split peas. Lentils. Nuts and seeds. Dairy Fiber-fortified yogurt. Beverages Fiber-fortified soy milk. Fiber-fortified orange juice. Other  foods Fiber bars. The items listed above may not be a complete list of recommended foods and beverages. Contact a dietitian for more options. What foods are not recommended? Fruits Fruit juice. Cooked, strained fruit. Vegetables Fried potatoes. Canned vegetables. Well-cooked vegetables. Grains White bread. Pasta made with refined flour. White rice. Meats and other proteins Fatty cuts of meat. Fried chicken or fried fish. Dairy Milk. Yogurt. Cream cheese. Sour cream. Fats and oils Butters. Beverages Soft drinks. Other foods Cakes and pastries. The items listed above may not be a complete list of foods and beverages to avoid. Contact a dietitian for more information. Summary  Fiber is a type of carbohydrate. It is found in fruits, vegetables, whole grains, and beans.  There are many health benefits of eating a high-fiber diet, such as preventing constipation, lowering blood cholesterol, helping with weight loss, and reducing your risk of heart disease, diabetes, and certain cancers.  Gradually increase your intake of fiber. Increasing too fast can result in cramping, bloating, and gas. Drink plenty of water while you increase your fiber.  The best sources of fiber include whole fruits and vegetables, whole grains, nuts, seeds, and beans. This information is not intended to replace advice given to you by your health care provider. Make sure you discuss any questions you have with your health care provider. Document Revised: 10/06/2017 Document Reviewed: 10/06/2017 Elsevier Patient Education  [place].      Hydrochlorothiazide, HCTZ; Losartan Tablets What is this medicine? LOSARTAN; HYDROCHLOROTHIAZIDE (loe SAR tan; hye droe klor oh THYE a zide) is a combination of a diuretic and an angiotensin II receptor blocker. It treats high blood pressure. It may also be used to lower the risk of stroke. This medicine may be used for other purposes; ask your health care provider  or pharmacist if you have questions. COMMON BRAND NAME(S): Hyzaar What should I tell my health care provider before I take this medicine? They need to know if you have any of these conditions:  decreased urine  diabetes  kidney disease  liver disease  if you are on a special diet, like a low-salt diet  immune system problems, like lupus  an unusual or allergic reaction to losartan, hydrochlorothiazide, sulfa drugs, other medicines, foods, dyes, or preservatives  pregnant or trying to get pregnant  breast-feeding How should I use this medicine? Take this drug by mouth. Take it as directed on the prescription label at the same time every day. You can take it with or without food. If it upsets your stomach, take it with food. Keep taking it unless your health care provider tells you to stop. Talk to your health care provider about the use of this drug in children. Special care may be needed. Overdosage: If you think you have taken too much of this medicine contact a poison control center or emergency room at once. NOTE: This medicine is only for you. Do not share this medicine with others. What if I miss a dose? If you miss a dose, take  it as soon as you can. If it is almost time for your next dose, take only that dose. Do not take double or extra doses. What may interact with this medicine?  barbiturates, like phenobarbital  blood pressure medicines  celecoxib  cimetidine  corticosteroids  diabetic medicines  diuretics, especially triamterene, spironolactone or amiloride  fluconazole  lithium  NSAIDs, medicines for pain and inflammation, like ibuprofen or naproxen  potassium salts or potassium supplements  prescription pain medicines  rifampin  skeletal muscle relaxants like tubocurarine  some cholesterol-lowering medicines like cholestyramine or colestipol This list may not describe all possible interactions. Give your health care provider a list of all the  medicines, herbs, non-prescription drugs, or dietary supplements you use. Also tell them if you smoke, drink alcohol, or use illegal drugs. Some items may interact with your medicine. What should I watch for while using this medicine? Check your blood pressure regularly while you are taking this medicine. Ask your doctor or health care professional what your blood pressure should be and when you should contact him or her. When you check your blood pressure, write down the measurements to show your doctor or health care professional. If you are taking this medicine for a long time, you must visit your health care professional for regular checks on your progress. Make sure you schedule appointments on a regular basis. You must not get dehydrated. Ask your doctor or health care professional how much fluid you need to drink a day. Check with him or her if you get an attack of severe diarrhea, nausea and vomiting, or if you sweat a lot. The loss of too much body fluid can make it dangerous for you to take this medicine. Women should inform their doctor if they wish to become pregnant or think they might be pregnant. There is a potential for serious side effects to an unborn child, particularly in the second or third trimester. Talk to your health care professional or pharmacist for more information. You may get drowsy or dizzy. Do not drive, use machinery, or do anything that needs mental alertness until you know how this drug affects you. Do not stand or sit up quickly, especially if you are an older patient. This reduces the risk of dizzy or fainting spells. Alcohol can make you more drowsy and dizzy. Avoid alcoholic drinks. This medicine may increase blood sugar. Ask your healthcare provider if changes in diet or medicines are needed if you have diabetes. Talk to your health care professional about your risk of skin cancer. You may be more at risk for skin cancer if you take this medicine. This medicine can  make you more sensitive to the sun. Keep out of the sun. If you cannot avoid being in the sun, wear protective clothing and use sunscreen. Do not use sun lamps or tanning beds/booths. Avoid salt substitutes unless you are told otherwise by your doctor or health care professional. Do not treat yourself for coughs, colds, or pain while you are taking this medicine without asking your doctor or health care professional for advice. Some ingredients may increase your blood pressure. What side effects may I notice from receiving this medicine? Side effects that you should report to your doctor or health care professional as soon as possible:  allergic reactions like skin rash, itching or hives, swelling of the face, lips, or tongue  breathing problems  changes in vision  dark urine  eye pain  fast or irregular heart beat, palpitations, or chest  pain  feeling faint or lightheaded  muscle cramps  persistent dry cough  redness, blistering, peeling or loosening of the skin, including inside the mouth   signs and symptoms of high blood sugar such as being more thirsty or hungry or having to urinate more than normal. You may also feel very tired or have blurry vision.  stomach pain  trouble passing urine  unusual bleeding or bruising  worsened gout pain  yellowing of the eyes or skin Side effects that usually do not require medical attention (report to your doctor or health care professional if they continue or are bothersome):  change in sex drive or performance  headache This list may not describe all possible side effects. Call your doctor for medical advice about side effects. You may report side effects to FDA at 1-800-FDA-1088. Where should I keep my medicine? Keep out of the reach of children and pets. Store at room temperature between 15 and 30 degrees C (59 and 86 degrees F). Protect from light. Keep the container tightly closed. Throw away any unused drug after the  expiration date. NOTE: This sheet is a summary. It may not cover all possible information. If you have questions about this medicine, talk to your doctor, pharmacist, or health care provider.  2020 Elsevier/Gold Standard (2019-08-09 12:58:40)

## 2020-06-24 ENCOUNTER — Other Ambulatory Visit: Payer: Self-pay | Admitting: Internal Medicine

## 2020-06-27 ENCOUNTER — Ambulatory Visit: Payer: PPO | Admitting: Vascular Surgery

## 2020-06-27 ENCOUNTER — Other Ambulatory Visit: Payer: Self-pay

## 2020-06-27 ENCOUNTER — Encounter: Payer: Self-pay | Admitting: Vascular Surgery

## 2020-06-27 VITALS — BP 143/80 | HR 62 | Temp 97.9°F | Resp 20 | Ht 69.0 in | Wt 254.0 lb

## 2020-06-27 DIAGNOSIS — I82401 Acute embolism and thrombosis of unspecified deep veins of right lower extremity: Secondary | ICD-10-CM | POA: Diagnosis not present

## 2020-06-27 NOTE — Progress Notes (Signed)
[facility] [place]  Patient name: Aaron Burns MRN: [id number] DOB: 04-25-1956 Sex: male  REASON FOR VISIT: Discuss venous duplex findings of calf vein DVT  HPI: Aaron Burns is a 64 y.o. male here today for discussion of calf vein DVT.  He is known to me from prior oblique exposure for L4-5 interbody fusion with Dr. Rolena Infante on 10/07/2019.  Postoperative duplex showed thrombus in bilateral gastrocnemius veins.  He had subsequent follow-up approximately 5 days later on 10/18/2019 showing no evidence of clot in his left leg.  He did have what appeared to be persistent clot in his right gastrocnemius vein.  He was placed on anticoagulation.  He recently underwent repeat duplex on 05/11/2020 I have this for review as well.  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  There is also a questionable frozen valve in the left deep femoral vein.  He is here today with his wife for discussion of this.  He does report hematuria.  He does have a history of kidney stones.  He does not have any particular complaints regarding his lower extremities  Past Medical History:  Diagnosis Date  . Diabetes mellitus, type 2 ([place])   . Elevated hemoglobin A1c   . Heart murmur    at birth  . History of kidney stones 2011  . Hypertension   . Low back pain    Radiates to left ankle.  Injured after falling on back.  . Morbid obesity (BMI 36. 06/07/2015  . 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 Renal disorder   . Tuberculosis    2014 dx, was treated with medications for about 4 months    Family History  Problem Relation Age of Onset  . Hypertension Mother   . Diabetes Mother   . Kidney failure Mother   . Diabetes Father   . Diabetes Sister   . Diabetes Brother     SOCIAL HISTORY: Social History   Tobacco Use  . Smoking status: Former Smoker    Packs/day: 1.00     Years: 41.00    Pack years: 41.00    Types: Cigarettes    Start date: 12/16/1977    Quit date: 09/27/2019    Years since quitting: 0.7  . Smokeless tobacco: Former Systems developer    Quit date: 08/16/2014  Substance Use Topics  . Alcohol use: Yes    Alcohol/week: 0.0 standard drinks    Comment: 1-2 drinks per month     Allergies  Allergen Reactions  . Ppd [Tuberculin Purified Protein Derivative]     Pt states he is not allergic to this.Marland Kitchenpositive tb test 2014, pt took tb treatment 2014    Current Outpatient Medications  Medication Sig Dispense Refill  . acetaminophen (TYLENOL) 325 MG tablet Take 2 tablets (650 mg total) by mouth every 4 (four) hours as needed for mild pain ((score 1 to 3) or temp > 100.5).    [name] Blood Glucose Monitoring Suppl DEVI Use to check blood sugar once daily 1 each 0  . Cholecalciferol (VITAMIN D3 MAXIMUM STRENGTH) 5000 UNITS capsule Take 10,000 Units by mouth daily.     [name] CINNAMON PO Take 1,000 mg by mouth daily.    . famotidine (PEPCID) 20 MG tablet Take 1 tablet (20 mg total) by mouth 2 (two) times daily as needed for heartburn or indigestion. 60 tablet 2  . fenofibrate (TRICOR)  145 MG tablet Take 1 tablet (145 mg total) by mouth daily. 90 tablet 1  . glipiZIDE (GLUCOTROL) 5 MG tablet Take 1/2 to 1 tablet   2 x/day with Meals for Diabetes 180 tablet 1  . glucose blood (FREESTYLE LITE) test strip Test blood sugar once daily 100 each 12  . Lancets MISC Test blood sugar once daily. 100 each 11  . losartan-hydrochlorothiazide (HYZAAR) 100-25 MG tablet Take 1 tablet by mouth daily. 30 tablet 1  . Magnesium 250 MG TABS Take 250 mg by mouth at bedtime.     . metFORMIN (GLUCOPHAGE-XR) 500 MG 24 hr tablet Take 1 tablet with Bkfst & Lunch & 2 tablets with Supper for Diabetes 360 tablet 3  . Multiple Vitamin (MULTIVITAMIN WITH MINERALS) TABS Take 1 tablet by mouth daily.    . phentermine (ADIPEX-P) 37.5 MG tablet Take 1/2 to 1 tablet Daily for Dieting & Weight Loss 90 tablet 0  .  rivaroxaban (XARELTO) 20 MG TABS tablet Take 1 tablet Daily to Prevent  Blood Clots 90 tablet 0  . rosuvastatin (CRESTOR) 5 MG tablet TAKE 1 TABLET BY MOUTH ONCE A WEEK IN THE EVENINGS ON SUNDAYS 4 tablet 1  . vitamin C (ASCORBIC ACID) 500 MG tablet Take 1,000 mg by mouth daily.    [name] zinc gluconate 50 MG tablet Take 50 mg by mouth daily.     No current facility-administered medications for this visit.    REVIEW OF SYSTEMS:  [X]  denotes positive finding, [ ]  denotes negative finding Cardiac  Comments:  Chest pain or chest pressure:    Shortness of breath upon exertion:    Short of breath when lying flat:    Irregular heart rhythm:        Vascular    Pain in calf, thigh, or hip brought on by ambulation:    Pain in feet at night that wakes you up from your sleep:     Blood clot in your veins:    Leg swelling:  x         PHYSICAL EXAM: Vitals:   06/27/20 1256  BP: (!) 143/80  Pulse: 62  Resp: 20  Temp: 97.9 F (36.6 C)  SpO2: 97%  Weight: 254 lb (115.2 kg)  Height: 5\' 9"  (1.753 m)    GENERAL: The patient is a well-nourished male, in no acute distress. The vital signs are documented above. CARDIOVASCULAR: 2+ dorsalis pedis pulses bilaterally.  No evidence of skin changes associated with venous hypertension PULMONARY: There is good air exchange  MUSCULOSKELETAL: There are no major deformities or cyanosis. NEUROLOGIC: No focal weakness or paresthesias are detected. SKIN: There are no ulcers or rashes noted. PSYCHIATRIC: The patient has a normal affect.  DATA:  Venous duplex from 10/12/2019, 10/18/2019 and 05/11/2020 were reviewed and discussed above.  Has had initially interpretation of bilateral gastrocnemius vein followed by right leg only followed by left peroneal laterally.  MEDICAL ISSUES: I discussed these findings with the patient.  He has been on anticoagulation therapy for over 6 months.  Explained the controversy of whether B DVTs even require anticoagulation  treatment.  He certainly has had adequate treatment with over 6 months.  I have asked him to stop his anticoagulation as of today.  He reports that he is on no other indication for anticoagulation.  I would not recommend any other venous duplex in the future unless he has new symptoms.  Explained that we would not treat his chronic changes and that his questionable  frozen vein in his left deep femoral vein is insignificant finding which is not treatable.  He and his wife are reassured with this discussion will see [place] again on an as-needed basis    Rosetta Posner, MD [facility] Tel [phone] Pager [phone]

## 2020-07-03 ENCOUNTER — Other Ambulatory Visit: Payer: Self-pay | Admitting: Internal Medicine

## 2020-07-05 ENCOUNTER — Encounter: Payer: Self-pay | Admitting: Adult Health

## 2020-07-05 NOTE — Progress Notes (Signed)
Assessment and Plan:  [place] was seen today for follow-up.  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   Essential hypertension CKD stage 1 due to type 2 diabetes mellitus ([place]) Microalbuminuric diabetic nephropathy (HCC) Taper down ziac to 1/2 tab until runs out then stop,  Continue hyzaar 1/2 tab until runs out, then increase to whole tab if needed if persistently above goal  Monitor blood pressure at home; call if consistently over 130/80 Continue DASH diet.   Reminder to go to the ER if any CP, SOB, nausea, dizziness, severe HA, changes vision/speech, left arm numbness and tingling and jaw pain. Follow up 1 month and check BMP/GFR, mag -     losartan-hydrochlorothiazide (HYZAAR) 100-25 MG tablet; Take 1 tablet by mouth daily.  Further disposition pending results of labs. Discussed med's effects and SE's.   Over 30 minutes of exam, counseling, chart review, and critical decision making was performed.   Future Appointments  Date Time Provider [place]  08/08/2020  2:30 PM [name], NP [facility] None  11/13/2020  2:30 PM [name], NP [facility] None  05/07/2021  2:00 PM [name], NP [facility] None    ------------------------------------------------------------------------------------------------------------------  HPI \\BP  128/72   Pulse 65    Temp (!) 97.5 F (36.4 C)   Ht 5\' 9"  (1.753 m)   Wt (!) 259 lb 6.4 oz (117.7 kg)   SpO2 97%   BMI 38.31 kg/m   64 y.o.male with T2DM, CKD, morbid obesity presents for 1 month follow up on glucose and CKD after initiation of glipizide. Notably saw Dr. Sherren Mocha Early for left chronic DVT persistent despite xarelto x 6+ months, was advised to taper off, no further follow up unless new sx. Was previously reporting intermittent hematuria which he reports has fully resolved.   A1C was up to 8.6  He was initiated on glipizide 5 mg and increased to taking BID last visit, but taking with breakfast and lunch, not with large dinner   Fasting over last month was 121-161 PM glucose prior to dinner 135-170  Much improved from previous 200+    He has been working on diet and exercise for T2 diabetes, and denies hypoglycemia , increased appetite, nausea, paresthesia of the feet, polydipsia, polyuria and visual disturbances. Last A1C in the office was:  Lab Results  Component Value Date   HGBA1C 8.6 (H) 05/04/2020   His blood pressure has not been controlled at home (130-150s/80s), today their BP is BP: 128/72 He is tapering off of ziac, started 1/2 tab of losartan/hctz 100/25   He does not workout. He denies chest pain, shortness of breath, dizziness.  Lab Results  Component Value Date   YCXKGYJE 56 05/04/2020   Lab Results  Component Value Date   CREATININE 1.06 05/04/2020   Lab Results  Component Value Date   MICROALBUR 6.8 05/04/2020   MICROALBUR 4.5 07/15/2019  he is prescribed phentermine for weight loss.  While on the medication they have lost 0 lbs since last visit. They deny palpitations, anxiety, trouble sleeping, elevated BP. He has gained some weight with initiation of glipizide. Admits to drinking juice, having cake, ice cream, sugary cereal - knows needs to do better.   BMI is Body mass index is 38.31 kg/m.,    Wt Readings from Last 3 Encounters:  07/06/20 (!) 259 lb 6.4 oz  (117.7 kg)  06/27/20 254 lb (115.2 kg)  06/08/20 256 lb (116.1 kg)      Past Medical History:  Diagnosis Date  . Diabetes mellitus, type 2 ([place])   . Elevated hemoglobin A1c   . Heart murmur    at birth  . History of kidney stones 2011  . Hypertension   . Leg DVT (deep venous thromboembolism), chronic, left ([place]) 05/16/2020   Dr. Sherren Mocha Early evaluated 06/27/2020, advised d/c xarelto, no further treatment recommended for this chronic DVT.    Presumed provoked bilateral DVT following lumbar surgery in Oct 2020, follow up study in 05/13/2020 showed:  LEFT: - Findings consistent with chronic deep vein thrombosis involving the left peroneal veins. - No cystic structure found in the popliteal fossa. - Echogenic structures noted  . Low back pain    Radiates to left ankle.  Injured after falling on back.  . Morbid obesity (BMI 36. 06/07/2015  . 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 Renal disorder   . Tuberculosis    2014 dx, was treated with medications for about 4 months     Allergies  Allergen Reactions  . Ppd [Tuberculin Purified Protein Derivative]     Pt states he is not allergic to this.Marland Kitchenpositive tb test 2014, pt took tb treatment 2014    Current Outpatient Medications on File Prior to Visit  Medication Sig  . acetaminophen (TYLENOL) 325 MG tablet Take 2 tablets (650 mg total) by mouth every 4 (four) hours as needed for mild pain ((score 1 to 3) or temp > 100.5).  [name] aspirin EC 81 MG tablet Take 81 mg by mouth daily. Swallow whole.  . Blood Glucose Monitoring Suppl DEVI Use to check blood sugar once daily  . Cholecalciferol (VITAMIN D3 MAXIMUM STRENGTH) 5000 UNITS capsule Take 10,000 Units by mouth daily.   [name] CINNAMON PO Take 1,000 mg by mouth daily.  . famotidine (PEPCID) 20 MG tablet Take 1 tablet (20 mg total) by mouth 2 (two) times daily as needed for heartburn or indigestion.  . fenofibrate (TRICOR) 145 MG tablet Take 1 tablet  (145 mg total) by mouth daily.  [name] glipiZIDE (GLUCOTROL) 5 MG tablet Take 1/2 to 1 tablet   2 x/day with Meals for Diabetes  . glucose blood (FREESTYLE LITE) test strip Test blood sugar once daily  . Lancets MISC Test blood sugar once daily.  [name] losartan-hydrochlorothiazide (HYZAAR) 100-25 MG tablet Take 1 tablet by mouth daily.  . Magnesium 250 MG TABS Take 250 mg by mouth at bedtime.   . metFORMIN (GLUCOPHAGE-XR) 500 MG 24 hr tablet Take 1 tablet with Bkfst & Lunch & 2 tablets with Supper for Diabetes  . Multiple Vitamin (MULTIVITAMIN WITH MINERALS) TABS Take 1 tablet by mouth daily.  . phentermine (ADIPEX-P) 37.5 MG tablet Take 1/2 to 1 tablet Daily for Dieting & Weight Loss  . rosuvastatin (CRESTOR) 5 MG tablet TAKE 1 TABLET BY MOUTH ONCE A WEEK IN THE EVENINGS ON SUNDAYS  .  vitamin C (ASCORBIC ACID) 500 MG tablet Take 1,000 mg by mouth daily.  [name] zinc gluconate 50 MG tablet Take 50 mg by mouth daily.   No current facility-administered medications on file prior to visit.    ROS: all negative except above.   Physical Exam:  BP 128/72   Pulse 65   Temp (!) 97.5 F (36.4 C)   Ht 5\' 9"  (1.753 m)   Wt (!) 259 lb 6.4 oz (117.7 kg)   SpO2 97%   BMI 38.31 kg/m   General Appearance: Well nourished, morbidly obese male in no apparent distress. Eyes: PERRLA, EOMs, conjunctiva no swelling or erythema ENT/Mouth: No erythema, swelling, or exudate on post pharynx.  Tonsils not swollen or erythematous. Hearing normal.  Neck: Supple, thyroid normal.  Respiratory: Respiratory effort normal, BS equal bilaterally without rales, rhonchi, wheezing or stridor.  Cardio: RRR with no MRGs. Brisk peripheral pulses without edema.  Abdomen: Soft, obese abdomen, + BS.  Non tender, no guarding, rebound, hernias, masses. Lymphatics: Non tender without lymphadenopathy.  Musculoskeletal: Full ROM, 5/5 strength, normal gait.  Skin: Warm, dry without rashes, lesions, ecchymosis.  Neuro: Normal muscle tone,  Sensation intact.  Psych: Awake and oriented X 3, normal affect, Insight and Judgment appropriate.     Izora Ribas, NP 11:18 AM [facility]

## 2020-07-06 ENCOUNTER — Other Ambulatory Visit: Payer: Self-pay

## 2020-07-06 ENCOUNTER — Encounter: Payer: Self-pay | Admitting: Adult Health

## 2020-07-06 ENCOUNTER — Ambulatory Visit (INDEPENDENT_AMBULATORY_CARE_PROVIDER_SITE_OTHER): Payer: PPO | Admitting: Adult Health

## 2020-07-06 VITALS — BP 128/72 | HR 65 | Temp 97.5°F | Ht 69.0 in | Wt 259.4 lb

## 2020-07-06 DIAGNOSIS — I10 Essential (primary) hypertension: Secondary | ICD-10-CM

## 2020-07-06 DIAGNOSIS — E1121 Type 2 diabetes mellitus with diabetic nephropathy: Secondary | ICD-10-CM

## 2020-07-06 DIAGNOSIS — N181 Chronic kidney disease, stage 1: Secondary | ICD-10-CM

## 2020-07-06 DIAGNOSIS — E1122 Type 2 diabetes mellitus with diabetic chronic kidney disease: Secondary | ICD-10-CM | POA: Diagnosis not present

## 2020-07-06 DIAGNOSIS — R7989 Other specified abnormal findings of blood chemistry: Secondary | ICD-10-CM

## 2020-07-06 DIAGNOSIS — I82502 Chronic embolism and thrombosis of unspecified deep veins of left lower extremity: Secondary | ICD-10-CM

## 2020-07-06 DIAGNOSIS — Z79899 Other long term (current) drug therapy: Secondary | ICD-10-CM

## 2020-07-06 NOTE — Patient Instructions (Addendum)
Goals    . Blood Pressure < 130/80    . HEMOGLOBIN A1C < 7.0    . LDL CALC < 70    . Weight (lb) < 240 lb (108.9 kg)      Cut old blood pressure med in 1/2 until you run out  Then if blood pressures are mostly above 130/80, increase new blood pressure medication to whole   Move lunch glipizide to evening    High-Fiber Diet Fiber, also called dietary fiber, is a type of carbohydrate that is found in fruits, vegetables, whole grains, and beans. A high-fiber diet can have many health benefits. Your health care provider may recommend a high-fiber diet to help:  Prevent constipation. Fiber can make your bowel movements more regular.  Lower your cholesterol.  Relieve the following conditions: ? Swelling of veins in the anus (hemorrhoids). ? Swelling and irritation (inflammation) of specific areas of the digestive tract (uncomplicated diverticulosis). ? A problem of the large intestine (colon) that sometimes causes pain and diarrhea (irritable bowel syndrome, IBS).  Prevent overeating as part of a weight-loss plan.  Prevent heart disease, type 2 diabetes, and certain cancers. What is my plan? The recommended daily fiber intake in grams (g) includes:  38 g for men age 65 or younger.  30 g for men over age 65.  55 g for women age 65 or younger.  21 g for women over age 65. You can get the recommended daily intake of dietary fiber by:  Eating a variety of fruits, vegetables, grains, and beans.  Taking a fiber supplement, if it is not possible to get enough fiber through your diet. What do I need to know about a high-fiber diet?  It is better to get fiber through food sources rather than from fiber supplements. There is not a lot of research about how effective supplements are.  Always check the fiber content on the nutrition facts label of any prepackaged food. Look for foods that contain 5 g of fiber or more per serving.  Talk with a diet and nutrition specialist (dietitian)  if you have questions about specific foods that are recommended or not recommended for your medical condition, especially if those foods are not listed below.  Gradually increase how much fiber you consume. If you increase your intake of dietary fiber too quickly, you may have bloating, cramping, or gas.  Drink plenty of water. Water helps you to digest fiber. What are tips for following this plan?  Eat a wide variety of high-fiber foods.  Make sure that half of the grains that you eat each day are whole grains.  Eat breads and cereals that are made with whole-grain flour instead of refined flour or white flour.  Eat brown rice, bulgur wheat, or millet instead of white rice.  Start the day with a breakfast that is high in fiber, such as a cereal that contains 5 g of fiber or more per serving.  Use beans in place of meat in soups, salads, and pasta dishes.  Eat high-fiber snacks, such as berries, raw vegetables, nuts, and popcorn.  Choose whole fruits and vegetables instead of processed forms like juice or sauce. What foods can I eat?  Fruits Berries. Pears. Apples. Oranges. Avocado. Prunes and raisins. Dried figs. Vegetables Sweet potatoes. Spinach. Kale. Artichokes. Cabbage. Broccoli. Cauliflower. Green peas. Carrots. Squash. Grains Whole-grain breads. Multigrain cereal. Oats and oatmeal. Brown rice. Barley. Bulgur wheat. [place]. Quinoa. Bran muffins. Popcorn. Rye wafer crackers. Meats and other proteins  [place], kidney, and pinto beans. Soybeans. Split peas. Lentils. Nuts and seeds. Dairy Fiber-fortified yogurt. Beverages Fiber-fortified soy milk. Fiber-fortified orange juice. Other foods Fiber bars. The items listed above may not be a complete list of recommended foods and beverages. Contact a dietitian for more options. What foods are not recommended? Fruits Fruit juice. Cooked, strained fruit. Vegetables Fried potatoes. Canned vegetables. Well-cooked  vegetables. Grains White bread. Pasta made with refined flour. White rice. Meats and other proteins Fatty cuts of meat. Fried chicken or fried fish. Dairy Milk. Yogurt. Cream cheese. Sour cream. Fats and oils Butters. Beverages Soft drinks. Other foods Cakes and pastries. The items listed above may not be a complete list of foods and beverages to avoid. Contact a dietitian for more information. Summary  Fiber is a type of carbohydrate. It is found in fruits, vegetables, whole grains, and beans.  There are many health benefits of eating a high-fiber diet, such as preventing constipation, lowering blood cholesterol, helping with weight loss, and reducing your risk of heart disease, diabetes, and certain cancers.  Gradually increase your intake of fiber. Increasing too fast can result in cramping, bloating, and gas. Drink plenty of water while you increase your fiber.  The best sources of fiber include whole fruits and vegetables, whole grains, nuts, seeds, and beans. This information is not intended to replace advice given to you by your health care provider. Make sure you discuss any questions you have with your health care provider. Document Revised: 10/06/2017 Document Reviewed: 10/06/2017 Elsevier Patient Education  2020 [employer].

## 2020-07-07 LAB — COMPLETE METABOLIC PANEL WITH GFR
AG Ratio: 1.7 (calc) (ref 1.0–2.5)
ALT: 37 U/L (ref 9–46)
AST: 27 U/L (ref 10–35)
Albumin: 4.3 g/dL (ref 3.6–5.1)
Alkaline phosphatase (APISO): 66 U/L (ref 35–144)
BUN: 15 mg/dL (ref 7–25)
CO2: 32 mmol/L (ref 20–32)
Calcium: 10.2 mg/dL (ref 8.6–10.3)
Chloride: 104 mmol/L (ref 98–110)
Creat: 0.84 mg/dL (ref 0.70–1.25)
GFR, Est African American: 106 mL/min/{1.73_m2} (ref >=60)
GFR, Est Non African American: 92 mL/min/{1.73_m2} (ref >=60)
Globulin: 2.5 g/dL (calc) (ref 1.9–3.7)
Glucose, Bld: 213 mg/dL — ABNORMAL HIGH (ref 65–99)
Potassium: 4.6 mmol/L (ref 3.5–5.3)
Sodium: 140 mmol/L (ref 135–146)
Total Bilirubin: 0.4 mg/dL (ref 0.2–1.2)
Total Protein: 6.8 g/dL (ref 6.1–8.1)

## 2020-07-07 LAB — MAGNESIUM: Magnesium: 1.6 mg/dL (ref 1.5–2.5)

## 2020-07-17 ENCOUNTER — Encounter: Payer: BLUE CROSS/BLUE SHIELD | Admitting: Physician Assistant

## 2020-07-20 ENCOUNTER — Encounter: Payer: BC Managed Care – PPO | Admitting: Physician Assistant

## 2020-07-26 ENCOUNTER — Other Ambulatory Visit: Payer: Self-pay | Admitting: Gastroenterology

## 2020-07-26 DIAGNOSIS — K760 Fatty (change of) liver, not elsewhere classified: Secondary | ICD-10-CM | POA: Diagnosis not present

## 2020-07-26 DIAGNOSIS — Z1211 Encounter for screening for malignant neoplasm of colon: Secondary | ICD-10-CM | POA: Diagnosis not present

## 2020-07-26 DIAGNOSIS — D7389 Other diseases of spleen: Secondary | ICD-10-CM

## 2020-07-26 DIAGNOSIS — R935 Abnormal findings on diagnostic imaging of other abdominal regions, including retroperitoneum: Secondary | ICD-10-CM | POA: Diagnosis not present

## 2020-08-04 ENCOUNTER — Other Ambulatory Visit: Payer: Self-pay | Admitting: Adult Health

## 2020-08-04 DIAGNOSIS — E1121 Type 2 diabetes mellitus with diabetic nephropathy: Secondary | ICD-10-CM

## 2020-08-04 DIAGNOSIS — E1122 Type 2 diabetes mellitus with diabetic chronic kidney disease: Secondary | ICD-10-CM

## 2020-08-04 DIAGNOSIS — I10 Essential (primary) hypertension: Secondary | ICD-10-CM

## 2020-08-08 ENCOUNTER — Ambulatory Visit: Payer: PPO | Admitting: Adult Health

## 2020-08-28 ENCOUNTER — Other Ambulatory Visit: Payer: PPO

## 2020-08-31 ENCOUNTER — Ambulatory Visit (INDEPENDENT_AMBULATORY_CARE_PROVIDER_SITE_OTHER): Payer: PPO | Admitting: Adult Health

## 2020-08-31 ENCOUNTER — Encounter: Payer: Self-pay | Admitting: Adult Health

## 2020-08-31 ENCOUNTER — Other Ambulatory Visit: Payer: Self-pay

## 2020-08-31 VITALS — BP 128/84 | HR 82 | Wt 260.8 lb

## 2020-08-31 DIAGNOSIS — E1122 Type 2 diabetes mellitus with diabetic chronic kidney disease: Secondary | ICD-10-CM | POA: Diagnosis not present

## 2020-08-31 DIAGNOSIS — E559 Vitamin D deficiency, unspecified: Secondary | ICD-10-CM

## 2020-08-31 DIAGNOSIS — I10 Essential (primary) hypertension: Secondary | ICD-10-CM

## 2020-08-31 DIAGNOSIS — I82502 Chronic embolism and thrombosis of unspecified deep veins of left lower extremity: Secondary | ICD-10-CM

## 2020-08-31 DIAGNOSIS — R160 Hepatomegaly, not elsewhere classified: Secondary | ICD-10-CM

## 2020-08-31 DIAGNOSIS — K760 Fatty (change of) liver, not elsewhere classified: Secondary | ICD-10-CM | POA: Diagnosis not present

## 2020-08-31 DIAGNOSIS — Z79899 Other long term (current) drug therapy: Secondary | ICD-10-CM

## 2020-08-31 DIAGNOSIS — Z87891 Personal history of nicotine dependence: Secondary | ICD-10-CM

## 2020-08-31 DIAGNOSIS — K219 Gastro-esophageal reflux disease without esophagitis: Secondary | ICD-10-CM

## 2020-08-31 DIAGNOSIS — Z0001 Encounter for general adult medical examination with abnormal findings: Secondary | ICD-10-CM | POA: Diagnosis not present

## 2020-08-31 DIAGNOSIS — N181 Chronic kidney disease, stage 1: Secondary | ICD-10-CM | POA: Diagnosis not present

## 2020-08-31 DIAGNOSIS — E1121 Type 2 diabetes mellitus with diabetic nephropathy: Secondary | ICD-10-CM

## 2020-08-31 DIAGNOSIS — R6889 Other general symptoms and signs: Secondary | ICD-10-CM | POA: Diagnosis not present

## 2020-08-31 DIAGNOSIS — E1169 Type 2 diabetes mellitus with other specified complication: Secondary | ICD-10-CM | POA: Diagnosis not present

## 2020-08-31 DIAGNOSIS — Z136 Encounter for screening for cardiovascular disorders: Secondary | ICD-10-CM | POA: Diagnosis not present

## 2020-08-31 DIAGNOSIS — E785 Hyperlipidemia, unspecified: Secondary | ICD-10-CM

## 2020-08-31 DIAGNOSIS — Z0000 Encounter for general adult medical examination without abnormal findings: Secondary | ICD-10-CM

## 2020-08-31 NOTE — Progress Notes (Signed)
MEDICARE ANNUAL WELLNESS VISIT AND FOLLOW UP Assessment:   Diagnoses and all orders for this visit:  Welcome to Medicare preventive visit -     EKG 12-Lead -     [place], RETROPERITNL ABD,  LTD  Essential hypertension Monitor blood pressure at home; call if consistently over 130/80 Continue DASH diet.   Reminder to go to the ER if any CP, SOB, nausea, dizziness, severe HA, changes vision/speech, left arm numbness and tingling and jaw pain. -     CBC with Differential/Platelet -     COMPLETE METABOLIC PANEL WITH GFR -     Lipid panel -     EKG 12-Lead -     [place], RETROPERITNL ABD,  LTD  Leg DVT (deep venous thromboembolism), chronic, left (HCC) Chronic; vascular evaluated and advised stop elequis; monitor  Type 2 diabetes mellitus with stage 1 chronic kidney disease, without long-term current use of insulin (HCC) Type 2 Diabetes Mellitus - poorly controlled Education: Reviewed 'ABCs' of diabetes management (respective goals in parentheses):  A1C (<7), blood pressure (<130/80), and cholesterol (LDL <70) Eye Exam yearly and Dental Exam every 6 months.- overdue eye exam, reminded to schedule, numbers given Dietary recommendations Physical Activity recommendations -     Hemoglobin A1c  Hyperlipidemia associated with type 2 diabetes mellitus ([place]) Titrate for LDL goal <70 Continue low cholesterol diet and exercise.  Check lipid panel.  -     Lipid panel -     TSH  CKD stage 1 due to type 2 diabetes mellitus (HCC) Increase fluids, avoid NSAIDS, monitor sugars, will monitor -     COMPLETE METABOLIC PANEL WITH GFR  Microalbuminuric diabetic nephropathy (HCC) Increase fluids, avoid NSAIDS, monitor sugars, will monitor; on ACE/ARB  Gastroesophageal reflux disease, unspecified whether esophagitis present Well managed on current medications Discussed diet, avoiding triggers and other lifestyle changes  Fatty liver Weight loss advised, avoid alcohol/tylenol, will monitor LFTs Dr. Paulita Fujita  following -     COMPLETE METABOLIC PANEL WITH GFR  Hepatomegaly Dr. Paulita Fujita following  Former heavy tobacco smoker (41 pack year, quit 2020) -lung cancer screening with low dose CT discussed as recommended by guidelines based on age, number of pack year history.  Discussed risks of screening including but not limited to false positives on xray, further testing or consultation with specialist, and possible false negative CT as well. Understanding expressed and wishes to defer at this time.  -     EKG 12-Lead -     [place], RETROPERITNL ABD,  LTD  Vitamin D deficiency At goal at recent check; continue to recommend supplementation for goal of 60-100 Defer vitamin D level  Morbid obesity ([place]) Long discussion about weight loss, diet, and exercise Discussed final goal weight  Patient on phentermine with benefit and no SE, taking drug breaks; continue close follow up. Return in 3 months  Medication management -     CBC with Differential/Platelet -     COMPLETE METABOLIC PANEL WITH GFR -     Magnesium  Encounter for routine adult health examination without abnormal findings -     EKG 12-Lead -     [place], RETROPERITNL ABD,  LTD  Screening for cardiovascular condition -     EKG 12-Lead  Screening for AAA (abdominal aortic aneurysm) -     [place], RETROPERITNL ABD,  LTD  Over 30 minutes of exam, counseling, chart review, and critical decision making was performed  Future Appointments  Date Time Provider [place]  09/05/2020  8:20  AM GI-315 MR 3 [id number] [facility] [facility]  12/04/2020  8:45 AM [name], NP [facility] None  05/07/2021  3:00 PM [name], NP [facility] None  09/06/2021 11:15 AM [name], NP [facility] None     Plan:   During the course of the visit the patient was educated and counseled about appropriate screening and preventive services including:    Pneumococcal vaccine   Influenza vaccine  Prevnar 13  Td vaccine  Screening  electrocardiogram  Colorectal cancer screening  Diabetes screening  Glaucoma screening  Nutrition counseling    Subjective:  Aaron Burns is a 64 y.o. male who presents for Medicare Annual Wellness Visit and 3 month follow up for HTN, hyperlipidemia, T2DM with diabetic nephropathy, and vitamin D Def.   His wife's father just passed away this AM in [place] with covid 19.   He has lumbar fusion in 09/2019 by Dr. Rolena Infante, does have some residual pain limited to lower back without radicular sx. Takes 2 tab regular tylenol but limited benefit. Plans to follow up. Had DVT complication after surgery - Notably saw Dr. Sherren Mocha Early in Juy 2021 for left chronic DVT persistent despite xarelto x 6+ months, was advised to taper off, no further follow up unless new sx. Was previously reporting intermittent hematuria which he reports has fully resolved.   Hx of elevated LFTs; [place] 04/2020 showed hepatomegaly and diffuse hepatic steatosis, several small hyperecchoic masses within spleen, possible hemangiomas - he was referred to GI Dr. Paulita Fujita, MRI pending.   He is former smoker, quit in 2020, 40 pack year history. Have discussed low dose CT for lung cancer screening but declines at this time.   BMI is Body mass index is 38.51 kg/m., he has been working on diet and exercise. Prescribed phentermine with benefit when takes, no SE but reports hasn't been taking recently, up 5 lb since last visit. He plans to start silver sneakers this fall.  Wt Readings from Last 3 Encounters:  08/31/20 260 lb 12.8 oz (118.3 kg)  07/06/20 (!) 259 lb 6.4 oz (117.7 kg)  06/27/20 254 lb (115.2 kg)   His blood pressure has been controlled at home, today their BP is BP: 128/84 He does not workout. He denies chest pain, shortness of breath, dizziness.    He is on cholesterol medication (rosuvastatin 5 mg and and fenofibrate 145 mg daily) denies myalgias. His cholesterol is not at goal. Reports rare alcohol. The cholesterol last visit  was:   Lab Results  Component Value Date   CHOL 149 05/04/2020   HDL 36 (L) 05/04/2020   [place]  05/04/2020     Comment:     . LDL cholesterol not calculated. Triglyceride levels greater than 400 mg/dL invalidate calculated LDL results. . Reference range: <100 . Desirable range <100 mg/dL for primary prevention;   <70 mg/dL for patients with CHD or diabetic patients  with > or = 2 CHD risk factors. [name] LDL-C is now calculated using the Martin-Hopkins  calculation, which is a validated novel method providing  better accuracy than the Friedewald equation in the  estimation of LDL-C.  Cresenciano Genre et al. Annamaria Helling. 6962;952(84): 2061-2068  (http://education.[facility].com/faq/FAQ164)    TRIG 586 (H) 05/04/2020   CHOLHDL 4.1 05/04/2020   He has been working on diet and exercise for T2 diabetes, on metformin 2000 mg daily, glipizide 5 mg BID, and denies hyperglycemia, hypoglycemia , increased appetite, nausea, polydipsia, polyuria and visual disturbances.  He reports fasting ranges 115-130, if checking prior  to dinner 145-150 He does have mild tingling in bil toes Last A1C in the office was:  Lab Results  Component Value Date   HGBA1C 8.6 (H) 05/04/2020   He has CKD 1 associated with htn and T2DM monitored at this office. He is on ARB. Last GFR Lab Results  Component Value Date   [facility] 92 07/06/2020   Patient is on Vitamin D supplement.   Lab Results  Component Value Date   VD25OH 54 05/04/2020      Medication Review:  Current Outpatient Medications (Endocrine & Metabolic):  .  glipiZIDE (GLUCOTROL) 5 MG tablet, Take 1/2 to 1 tablet   2 x/day with Meals for Diabetes .  metFORMIN (GLUCOPHAGE-XR) 500 MG 24 hr tablet, Take 1 tablet with Bkfst & Lunch & 2 tablets with Supper for Diabetes  Current Outpatient Medications (Cardiovascular):  .  fenofibrate (TRICOR) 145 MG tablet, Take 1 tablet (145 mg total) by mouth daily. [name]  losartan-hydrochlorothiazide (HYZAAR) 100-25 MG  tablet, TAKE ONE TABLET BY MOUTH DAILY .  rosuvastatin (CRESTOR) 5 MG tablet, TAKE 1 TABLET BY MOUTH ONCE A WEEK IN THE EVENINGS ON SUNDAYS   Current Outpatient Medications (Analgesics):  .  acetaminophen (TYLENOL) 325 MG tablet, Take 2 tablets (650 mg total) by mouth every 4 (four) hours as needed for mild pain ((score 1 to 3) or temp > 100.5). [name]  aspirin EC 81 MG tablet, Take 81 mg by mouth daily. Swallow whole.   Current Outpatient Medications (Other):  [name]  Cholecalciferol (VITAMIN D3 MAXIMUM STRENGTH) 5000 UNITS capsule, Take 10,000 Units by mouth daily.  [name]  CINNAMON PO, Take 1,000 mg by mouth daily. .  famotidine (PEPCID) 20 MG tablet, Take 1 tablet (20 mg total) by mouth 2 (two) times daily as needed for heartburn or indigestion. .  Magnesium 250 MG TABS, Take 250 mg by mouth at bedtime.  .  Multiple Vitamin (MULTIVITAMIN WITH MINERALS) TABS, Take 1 tablet by mouth daily. .  vitamin C (ASCORBIC ACID) 500 MG tablet, Take 1,000 mg by mouth daily. [name]  zinc gluconate 50 MG tablet, Take 50 mg by mouth daily. .  Blood Glucose Monitoring Suppl DEVI, Use to check blood sugar once daily .  glucose blood (FREESTYLE LITE) test strip, Test blood sugar once daily .  Lancets MISC, Test blood sugar once daily. .  phentermine (ADIPEX-P) 37.5 MG tablet, Take 1/2 to 1 tablet Daily for Dieting & Weight Loss (Patient not taking: Reported on 08/31/2020)  Allergies: Allergies  Allergen Reactions  . 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  Screening Tests Immunization History  Administered Date(s) Administered  . Influenza Inj Mdck Quad With Preservative 10/10/2017  . Influenza Split 12/01/2013  . Influenza-Unspecified 10/02/2018, 08/14/2019  . Pneumococcal Polysaccharide-23 10/10/2017  . Td 12/17/2003  . Tdap 02/14/2015    Preventative care: Last colonoscopy: 12/2018, Dr. Oletta Lamas, 2 benign polyps, 10 year follow up  Prior vaccinations: TD or Tdap: 2016  Influenza: 2020, due in Oct  Pneumococcal: 2018 Prevnar13: defer  Shingles/Zostavax: defer  Names of Other Physician/Practitioners you currently use: 1. [facility] here for primary care 2. Dr. ?, eye doctor, last visit ? 2016, overdue diabetic eye 3. Dr. Joelyn Oms, [facility], dentist, last visit 2021, goes q16m, needs implants   Patient Care Team: Unk Pinto, MD as PCP - General (Internal Medicine)  Surgical: He  has a past surgical history that includes Hernia repair; Cystoscopy/retrograde/ureteroscopy/stone extraction with basket (5/11); Wisdom tooth extraction (yrs ago); Knee surgery (Right, 11/25/11 and 2015); Cholecystectomy (07/27/2012); Total knee arthroplasty (Right, 06/06/2015); Colonoscopy; Anterior lumbar fusion (N/A, 10/07/2019); and Abdominal exposure (N/A, 10/07/2019). Family His family history includes Diabetes in his brother, father, mother, and sister; Hypertension in his mother; Kidney failure in his mother. Social history  He reports that he quit smoking about 11 months ago. His smoking use included cigarettes. He started smoking about 42 years ago. He has a 41.00 pack-year smoking history. He quit smokeless tobacco use about 6 years ago. He reports current alcohol use. He reports that he does not  use drugs.  MEDICARE WELLNESS OBJECTIVES: Physical activity: Current Exercise Habits: The patient does not participate in regular exercise at present, Exercise limited by: orthopedic condition(s) Cardiac risk factors: Cardiac Risk Factors include: advanced age (>80men, >53 women);dyslipidemia;hypertension;obesity (BMI >30kg/m2);sedentary lifestyle;diabetes mellitus;smoking/ tobacco exposure Depression/mood screen:   Depression screen [facility] 2/9 08/31/2020  Decreased Interest 0  Down, Depressed, Hopeless 1  PHQ - 2 Score 1    ADLs:  In your present state of health, do you have any difficulty performing the following activities: 08/31/2020 11/01/2019  Hearing? N Y  Vision? N N  Difficulty concentrating or making decisions? N N  Walking or climbing stairs? N Y  Dressing or bathing? N N  Doing errands, shopping? N N  Some recent data might be hidden     Cognitive Testing  Alert? Yes  Normal Appearance?Yes  Oriented to person? Yes  Place? Yes   Time? Yes  Recall of three objects?  Yes  Can perform simple calculations? Yes  Displays appropriate judgment?Yes  Can read the correct time from a watch face?Yes  EOL planning: Does Patient Have a Medical Advance Directive?: No Would patient like information on creating a medical advance directive?: No - Patient declined   Objective:   Today's Vitals   08/31/20 1159  BP: 128/84  Pulse: 82  SpO2: 97%  Weight: 260 lb 12.8 oz (118.3 kg)   Body mass index is 38.51 kg/m.  General appearance: alert, no distress, WD/WN, morbidly obese male HEENT: normocephalic, sclerae anicteric, TMs pearly, nares patent, no discharge or erythema, pharynx normal Oral cavity: MMM, no lesions Neck: supple, no lymphadenopathy, no thyromegaly, no masses Heart: RRR, normal S1, S2, no murmurs Lungs: CTA bilaterally, no wheezes, rhonchi, or rales Abdomen: +bs, soft obses, non tender, non distended, no masses, no hepatomegaly, no splenomegaly Musculoskeletal:  nontender, no swelling, no obvious deformity Extremities: no edema, no cyanosis, no clubbing Pulses: 2+ symmetric, upper and lower extremities, normal cap refill Neurological: alert, oriented x 3, CN2-12 intact, strength normal upper extremities and lower extremities, sensation normal throughout, DTRs 2+ throughout, no cerebellar signs, gait normal Psychiatric: normal affect, behavior normal, pleasant   EKG: NSR, mild leftward axis AAA [place]: <3 cm   Medicare Attestation I have personally reviewed: The patient's medical and social history Their use of alcohol, tobacco or illicit drugs Their  current medications and supplements The patient's functional ability including ADLs,fall risks, home safety risks, cognitive, and hearing and visual impairment Diet and physical activities Evidence for depression or mood disorders  The patient's weight, height, BMI, and visual acuity have been recorded in the chart.  I have made referrals, counseling, and provided education to the patient based on review of the above and I have provided the patient with a written personalized care plan for preventive services.     Izora Ribas, NP   08/31/2020

## 2020-08-31 NOTE — Patient Instructions (Addendum)
Aaron Burns , Thank you for taking time to come for your Medicare Wellness Visit. I appreciate your ongoing commitment to your health goals. Please review the following plan we discussed and let me know if I can assist you in the future.   These are the goals we discussed: Goals    . Blood Pressure < 130/80    . HEMOGLOBIN A1C < 7.0    . LDL CALC < 70    . Weight (lb) < 240 lb (108.9 kg)       This is a list of the screening recommended for you and due dates:  Health Maintenance  Topic Date Due  . Eye exam for diabetics  Never done  . COVID-19 Vaccine (1) Never done  . Pneumonia vaccines (1 of 2 - PCV13) 12/17/2019  . Flu Shot  07/16/2020  . Hemoglobin A1C  11/04/2020  . Complete foot exam   05/04/2021  . Tetanus Vaccine  02/13/2025  . Colon Cancer Screening  12/18/2028  .  Hepatitis C: One time screening is recommended by Center for Disease Control  (CDC) for  adults born from [birth year] through 1966.   Completed  . HIV Screening  Completed    Please schedule a diabetes eye exam   Eye doctors that you can call, they are all very close to our office   Dr. Delman Cheadle [phone] Dr. Bing Plume [phone] Dr. Herbert Deaner [phone]   Please get flu vaccine in October  Recommend getting covid 19 vaccine - pfizer - asap - even 1 dose prior to going to [place] would be helpful     High Triglycerides Eating Plan Triglycerides are a type of fat in the blood. High levels of triglycerides can increase your risk of heart disease and stroke. If your triglyceride levels are high, choosing the right foods can help lower your triglycerides and keep your heart healthy. Work with your health care provider or a diet and nutrition specialist (dietitian) to develop an eating plan that is right for you. What are tips for following this plan? General guidelines   Lose weight, if you are overweight. For most people, losing 5-10 lbs (2-5 kg) helps lower triglyceride levels. A weight-loss plan may  include. ? 30 minutes of exercise at least 5 days a week. ? Reducing the amount of calories, sugar, and fat you eat.  Eat a wide variety of fresh fruits, vegetables, and whole grains. These foods are high in fiber.  Eat foods that contain healthy fats, such as fatty fish, nuts, seeds, and olive oil.  Avoid foods that are high in added sugar, added salt (sodium), saturated fat, and trans fat.  Avoid low-fiber, refined carbohydrates such as white bread, crackers, noodles, and white rice.  Avoid foods with partially hydrogenated oils (trans fats), such as fried foods or stick margarine.  Limit alcohol intake to no more than 1 drink a day for nonpregnant women and 2 drinks a day for men. One drink equals 12 oz of beer, 5 oz of wine, or 1 oz of hard liquor. Your health care provider may recommend that you drink less depending on your overall health. Reading food labels  Check food labels for the amount of saturated fat. Choose foods with no or very little saturated fat.  Check food labels for the amount of trans fat. Choose foods with no trans fat.  Check food labels for the amount of cholesterol. Choose foods low in cholesterol. Ask your dietitian how much  cholesterol you should have each day.  Check food labels for the amount of sodium. Choose foods with less than 140 milligrams (mg) per serving. Shopping  Buy dairy products labeled as nonfat (skim) or low-fat (1%).  Avoid buying processed or prepackaged foods. These are often high in added sugar, sodium, and fat. Cooking  Choose healthy fats when cooking, such as olive oil or canola oil.  Cook foods using lower fat methods, such as baking, broiling, boiling, or grilling.  Make your own sauces, dressings, and marinades when possible, instead of buying them. Store-bought sauces, dressings, and marinades are often high in sodium and sugar. Meal planning  Eat more home-cooked food and less restaurant, buffet, and fast food.  Eat  fatty fish at least 2 times each week. Examples of fatty fish include salmon, trout, mackerel, tuna, and herring.  If you eat whole eggs, do not eat more than 3 egg yolks per week. What foods are recommended? The items listed may not be a complete list. Talk with your dietitian about what dietary choices are best for you. Grains Whole wheat or whole grain breads, crackers, cereals, and pasta. Unsweetened oatmeal. Bulgur. Barley. Quinoa. Brown rice. Whole wheat flour tortillas. Vegetables Fresh or frozen vegetables. Low-sodium canned vegetables. Fruits All fresh, canned (in natural juice), or frozen fruits. Meats and other protein foods Skinless chicken or [place]. Ground chicken or [place]. Lean cuts of pork, trimmed of fat. Fish and seafood, especially salmon, trout, and herring. Egg whites. Dried beans, peas, or lentils. Unsalted nuts or seeds. Unsalted canned beans. Natural peanut or almond butter. Dairy Low-fat dairy products. Skim or low-fat (1%) milk. Reduced fat (2%) and low-sodium cheese. Low-fat ricotta cheese. Low-fat cottage cheese. Plain, low-fat yogurt. Fats and oils Tub margarine without trans fats. Light or reduced-fat mayonnaise. Light or reduced-fat salad dressings. Avocado. Safflower, olive, sunflower, soybean, and canola oils. What foods are not recommended? The items listed may not be a complete list. Talk with your dietitian about what dietary choices are best for you. Grains White bread. White (regular) pasta. White rice. Cornbread. Bagels. Pastries. Crackers that contain trans fat. Vegetables Creamed or fried vegetables. Vegetables in a cheese sauce. Fruits Sweetened dried fruit. Canned fruit in syrup. Fruit juice. Meats and other protein foods Fatty cuts of meat. Ribs. Chicken wings. Berniece Salines. Sausage. Bologna. Salami. Chitterlings. Fatback. Hot dogs. Bratwurst. Packaged lunch meats. Dairy Whole or reduced-fat (2%) milk. Half-and-half. Cream cheese. Full-fat or  sweetened yogurt. Full-fat cheese. Nondairy creamers. Whipped toppings. Processed cheese or cheese spreads. Cheese curds. Beverages Alcohol. Sweetened drinks, such as soda, lemonade, fruit drinks, or punches. Fats and oils Butter. Stick margarine. Lard. Shortening. Ghee. Bacon fat. Tropical oils, such as coconut, palm kernel, or palm oils. Sweets and desserts Corn syrup. Sugars. Honey. Molasses. Candy. Jam and jelly. Syrup. Sweetened cereals. Cookies. Pies. Cakes. Donuts. Muffins. Ice cream. Condiments Store-bought sauces, dressings, and marinades that are high in sugar, such as ketchup and barbecue sauce. Summary  High levels of triglycerides can increase the risk of heart disease and stroke. Choosing the right foods can help lower your triglycerides.  Eat plenty of fresh fruits, vegetables, and whole grains. Choose low-fat dairy and lean meats. Eat fatty fish at least twice a week.  Avoid processed and prepackaged foods with added sugar, sodium, saturated fat, and trans fat.  If you need suggestions or have questions about what types of food are good for you, talk with your health care provider or a dietitian. This information is not intended  to replace advice given to you by your health care provider. Make sure you discuss any questions you have with your health care provider. Document Revised: 11/14/2017 Document Reviewed: 02/04/2017 Elsevier Patient Education  2020 [employer].

## 2020-09-01 ENCOUNTER — Other Ambulatory Visit: Payer: Self-pay | Admitting: Adult Health

## 2020-09-01 DIAGNOSIS — N289 Disorder of kidney and ureter, unspecified: Secondary | ICD-10-CM

## 2020-09-01 LAB — LIPID PANEL
Cholesterol: 142 mg/dL (ref <200)
HDL: 36 mg/dL — ABNORMAL LOW (ref >=40)
LDL Cholesterol (Calc): 67 mg/dL (calc)
Non-HDL Cholesterol (Calc): 106 mg/dL (calc) (ref <130)
Total CHOL/HDL Ratio: 3.9 (calc) (ref <5.0)
Triglycerides: 322 mg/dL — ABNORMAL HIGH (ref <150)

## 2020-09-01 LAB — HEMOGLOBIN A1C
Hgb A1c MFr Bld: 7.7 % of total Hgb — ABNORMAL HIGH (ref <5.7)
Mean Plasma Glucose: 174 (calc)
eAG (mmol/L): 9.7 (calc)

## 2020-09-01 LAB — CBC WITH DIFFERENTIAL/PLATELET
Absolute Monocytes: 832 cells/uL (ref 200–950)
Basophils Absolute: 73 cells/uL (ref 0–200)
Basophils Relative: 1 %
Eosinophils Absolute: 88 cells/uL (ref 15–500)
Eosinophils Relative: 1.2 %
HCT: 44.1 % (ref 38.5–50.0)
Hemoglobin: 14.8 g/dL (ref 13.2–17.1)
Lymphs Abs: 2000 cells/uL (ref 850–3900)
MCH: 30.3 pg (ref 27.0–33.0)
MCHC: 33.6 g/dL (ref 32.0–36.0)
MCV: 90.4 fL (ref 80.0–100.0)
MPV: 11.1 fL (ref 7.5–12.5)
Monocytes Relative: 11.4 %
Neutro Abs: 4307 cells/uL (ref 1500–7800)
Neutrophils Relative %: 59 %
Platelets: 245 10*3/uL (ref 140–400)
RBC: 4.88 10*6/uL (ref 4.20–5.80)
RDW: 12.6 % (ref 11.0–15.0)
Total Lymphocyte: 27.4 %
WBC: 7.3 10*3/uL (ref 3.8–10.8)

## 2020-09-01 LAB — COMPLETE METABOLIC PANEL WITH GFR
AG Ratio: 1.7 (calc) (ref 1.0–2.5)
ALT: 38 U/L (ref 9–46)
AST: 25 U/L (ref 10–35)
Albumin: 4.4 g/dL (ref 3.6–5.1)
Alkaline phosphatase (APISO): 56 U/L (ref 35–144)
BUN/Creatinine Ratio: 20 (calc) (ref 6–22)
BUN: 25 mg/dL (ref 7–25)
CO2: 33 mmol/L — ABNORMAL HIGH (ref 20–32)
Calcium: 10.7 mg/dL — ABNORMAL HIGH (ref 8.6–10.3)
Chloride: 102 mmol/L (ref 98–110)
Creat: 1.27 mg/dL — ABNORMAL HIGH (ref 0.70–1.25)
GFR, Est African American: 68 mL/min/{1.73_m2} (ref >=60)
GFR, Est Non African American: 59 mL/min/{1.73_m2} — ABNORMAL LOW (ref >=60)
Globulin: 2.6 g/dL (calc) (ref 1.9–3.7)
Glucose, Bld: 194 mg/dL — ABNORMAL HIGH (ref 65–99)
Potassium: 4.5 mmol/L (ref 3.5–5.3)
Sodium: 141 mmol/L (ref 135–146)
Total Bilirubin: 0.4 mg/dL (ref 0.2–1.2)
Total Protein: 7 g/dL (ref 6.1–8.1)

## 2020-09-01 LAB — TSH: TSH: 2.8 mIU/L (ref 0.40–4.50)

## 2020-09-01 LAB — MAGNESIUM: Magnesium: 1.9 mg/dL (ref 1.5–2.5)

## 2020-09-05 ENCOUNTER — Other Ambulatory Visit: Payer: PPO

## 2020-09-22 DIAGNOSIS — M545 Low back pain, unspecified: Secondary | ICD-10-CM | POA: Diagnosis not present

## 2020-09-22 DIAGNOSIS — Z981 Arthrodesis status: Secondary | ICD-10-CM | POA: Diagnosis not present

## 2020-09-25 ENCOUNTER — Other Ambulatory Visit: Payer: Self-pay

## 2020-09-25 ENCOUNTER — Ambulatory Visit
Admission: RE | Admit: 2020-09-25 | Discharge: 2020-09-25 | Disposition: A | Discharge status: Home / Self Care | Payer: PPO | Source: Ambulatory Visit | Attending: Gastroenterology | Admitting: Gastroenterology

## 2020-09-25 DIAGNOSIS — D7389 Other diseases of spleen: Secondary | ICD-10-CM

## 2020-09-25 DIAGNOSIS — D1803 Hemangioma of intra-abdominal structures: Secondary | ICD-10-CM | POA: Diagnosis not present

## 2020-09-25 DIAGNOSIS — K7689 Other specified diseases of liver: Secondary | ICD-10-CM | POA: Diagnosis not present

## 2020-09-25 DIAGNOSIS — R935 Abnormal findings on diagnostic imaging of other abdominal regions, including retroperitoneum: Secondary | ICD-10-CM

## 2020-09-25 DIAGNOSIS — K760 Fatty (change of) liver, not elsewhere classified: Secondary | ICD-10-CM | POA: Diagnosis not present

## 2020-09-25 MED ORDER — GADOBENATE DIMEGLUMINE 529 MG/ML IV SOLN
20.0000 mL | Freq: Once | INTRAVENOUS | Status: AC | PRN
  Administered 2020-09-25: 20 mL via INTRAVENOUS

## 2020-09-27 ENCOUNTER — Other Ambulatory Visit: Payer: Self-pay | Admitting: Internal Medicine

## 2020-10-02 ENCOUNTER — Other Ambulatory Visit: Payer: Self-pay

## 2020-10-02 ENCOUNTER — Ambulatory Visit (INDEPENDENT_AMBULATORY_CARE_PROVIDER_SITE_OTHER): Payer: PPO

## 2020-10-02 DIAGNOSIS — N289 Disorder of kidney and ureter, unspecified: Secondary | ICD-10-CM | POA: Diagnosis not present

## 2020-10-02 NOTE — Progress Notes (Signed)
Patient presents to the office for a nurse visit to have labs done to check kidney functions. No questions or concerns. Vitals taken and recorded.

## 2020-10-03 ENCOUNTER — Encounter: Payer: Self-pay | Admitting: Adult Health

## 2020-10-03 LAB — BASIC METABOLIC PANEL WITH GFR
BUN/Creatinine Ratio: 26 (calc) — ABNORMAL HIGH (ref 6–22)
BUN: 30 mg/dL — ABNORMAL HIGH (ref 7–25)
CO2: 28 mmol/L (ref 20–32)
Calcium: 11.3 mg/dL — ABNORMAL HIGH (ref 8.6–10.3)
Chloride: 101 mmol/L (ref 98–110)
Creat: 1.17 mg/dL (ref 0.70–1.25)
GFR, Est African American: 75 mL/min/{1.73_m2} (ref >=60)
GFR, Est Non African American: 65 mL/min/{1.73_m2} (ref >=60)
Glucose, Bld: 163 mg/dL — ABNORMAL HIGH (ref 65–99)
Potassium: 4.9 mmol/L (ref 3.5–5.3)
Sodium: 140 mmol/L (ref 135–146)

## 2020-10-03 LAB — URINALYSIS, ROUTINE W REFLEX MICROSCOPIC
Bacteria, UA: NONE SEEN /HPF
Bilirubin Urine: NEGATIVE
Glucose, UA: NEGATIVE
Hgb urine dipstick: NEGATIVE
Hyaline Cast: NONE SEEN /LPF
Ketones, ur: NEGATIVE
Nitrite: NEGATIVE
Specific Gravity, Urine: 1.023 (ref 1.001–1.03)
Squamous Epithelial / HPF: NONE SEEN /HPF (ref <=5)
pH: <=5 (ref 5.0–8.0)

## 2020-10-04 ENCOUNTER — Other Ambulatory Visit: Payer: Self-pay | Admitting: Adult Health

## 2020-10-04 DIAGNOSIS — I10 Essential (primary) hypertension: Secondary | ICD-10-CM

## 2020-10-04 DIAGNOSIS — N181 Chronic kidney disease, stage 1: Secondary | ICD-10-CM

## 2020-10-04 DIAGNOSIS — E1121 Type 2 diabetes mellitus with diabetic nephropathy: Secondary | ICD-10-CM

## 2020-10-04 DIAGNOSIS — E1122 Type 2 diabetes mellitus with diabetic chronic kidney disease: Secondary | ICD-10-CM

## 2020-10-11 ENCOUNTER — Other Ambulatory Visit: Payer: Self-pay | Admitting: Adult Health

## 2020-10-19 ENCOUNTER — Other Ambulatory Visit: Payer: Self-pay | Admitting: Internal Medicine

## 2020-10-19 ENCOUNTER — Other Ambulatory Visit: Payer: Self-pay | Admitting: Adult Health

## 2020-11-13 ENCOUNTER — Ambulatory Visit: Payer: PPO | Admitting: Adult Health

## 2020-12-01 NOTE — Progress Notes (Signed)
3 MONTH FOLLOW UP Assessment:    Essential hypertension Monitor blood pressure at home; call if consistently over 130/80 Continue DASH diet.   Reminder to go to the ER if any CP, SOB, nausea, dizziness, severe HA, changes vision/speech, left arm numbness and tingling and jaw pain. -     CBC with Differential/Platelet -     COMPLETE METABOLIC PANEL WITH GFR -     Lipid panel  Leg DVT (deep venous thromboembolism), chronic, left (HCC) Chronic; vascular evaluated and advised stop elequis; monitor  Type 2 diabetes mellitus with stage 1 chronic kidney disease, without long-term current use of insulin (HCC) Type 2 Diabetes Mellitus - poorly controlled Education: Reviewed 'ABCs' of diabetes management (respective goals in parentheses):  A1C (<7), blood pressure (<130/80), and cholesterol (LDL <70) Eye Exam yearly and Dental Exam every 6 months.- overdue eye exam, reminded to schedule, numbers given Dietary recommendations Physical Activity recommendations -     Hemoglobin A1c  Hyperlipidemia associated with type 2 diabetes mellitus ([place]) Titrate for LDL goal <70 Continue low cholesterol diet and exercise.  Check lipid panel.  -     Lipid panel -     TSH  CKD stage 1 due to type 2 diabetes mellitus (HCC) Increase fluids, avoid NSAIDS, monitor sugars, will monitor -     COMPLETE METABOLIC PANEL WITH GFR  Microalbuminuric diabetic nephropathy (HCC) Increase fluids, avoid NSAIDS, monitor sugars, will monitor; on ACE/ARB  Gastroesophageal reflux disease, unspecified whether esophagitis present Well managed on current medications Discussed diet, avoiding triggers and other lifestyle changes  Fatty liver Weight loss advised, avoid alcohol/tylenol, will monitor LFTs Dr. Paulita Fujita following -     COMPLETE METABOLIC PANEL WITH GFR  Hepatomegaly Dr. 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  Discussed risks of screening including but not limited to false positives on xray, further testing or consultation with specialist, and possible false negative CT as well. Understanding expressed and wishes to defer at this time.   Vitamin D deficiency At goal at recent check; continue to recommend supplementation for goal of 60-100 Defer vitamin D level  Morbid obesity ([place]) Long discussion about weight loss, diet, and exercise Discussed final goal weight  Patient on phentermine with benefit and no SE, taking drug breaks; continue close follow up. Will try adding topamax. Return in 3 months  Medication management -     CBC with Differential/Platelet -     COMPLETE METABOLIC PANEL WITH GFR -     Magnesium  Hypercalcemia - check PTH  Over 30 minutes of exam, counseling, chart review, and critical decision making was performed  Future Appointments  Date Time Provider [place]  05/07/2021  3:00 PM [name], NP [facility] None  09/06/2021 11:15 AM [name], NP [facility] None     Subjective:  Aaron Burns is a 64 y.o. male who presents for 3 month follow up for HTN, hyperlipidemia, T2DM with diabetic nephropathy, and vitamin D Def.   He has lumbar fusion in 09/2019 by Dr. Rolena Infante, does have some residual pain limited to lower back without radicular sx. Takes 2 tab regular tylenol but limited benefit. Was recommended weight loss.   Had DVT complication after surgery - Notably saw Dr. Sherren Mocha Early in Juy 2021 for left chronic DVT persistent despite xarelto x 6+ months, was advised to taper off, no further  follow up unless new sx. Was previously reporting intermittent hematuria which he reports has fully resolved.   Hx of elevated LFTs; [place] 04/2020 showed hepatomegaly and diffuse hepatic steatosis, several small hyperecchoic masses within spleen, possible hemangiomas - he was referred to GI Dr. 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   He is former smoker, quit in 2020, 40 pack year history. Have discussed low dose CT for lung cancer screening but declines at this time.   BMI is Body mass index is 39.52 kg/m., he has been working on diet and exercise. Prescribed phentermine with benefit when takes, no SE but reports hasn't been taking recently, up 4 lb since last visit. Reports just joined silver sneakers. Trying to do more salads for lunch, bran flakes/oatmeal for breakfast.  Wt Readings from Last 3 Encounters:  12/04/20 267 lb 9.6 oz (121.4 kg)  10/02/20 263 lb (119.3 kg)  08/31/20 260 lb 12.8 oz (118.3 kg)   His blood pressure has been controlled at home, today their BP is BP: 138/80 He does not workout. He denies chest pain, shortness of breath, dizziness.    He is on cholesterol medication (rosuvastatin 5 mg and and fenofibrate 145 mg daily) denies myalgias. His cholesterol is at goal. Reports rare alcohol. The cholesterol last visit was:   Lab Results  Component Value Date   CHOL 142 08/31/2020   HDL 36 (L) 08/31/2020   LDLCALC 67 08/31/2020   TRIG 322 (H) 08/31/2020   CHOLHDL 3.9 08/31/2020   He has been working on diet and exercise for T2 diabetes, on metformin 2000 mg daily, glipizide 5 mg BID, and denies hyperglycemia, hypoglycemia , increased appetite, nausea, polydipsia, polyuria and visual disturbances.  He reports fasting ranges 140-150s He does have mild tingling in bil toes Last A1C in the office was:  Lab Results  Component Value Date   HGBA1C 7.7 (H) 08/31/2020   He has CKD 1 associated with htn and T2DM monitored at this office. He is on ARB.  Last GFR Lab Results  Component Value Date   GFRNONAA 65 10/02/2020   Patient is on Vitamin D supplement.   Lab Results  Component Value Date   VD25OH 79 05/04/2020     He denies taking any calcium supplement, does report occasional tums use:  Lab Results   Component Value Date   CALCIUM 11.3 (H) 10/02/2020   CAION 1.16 11/25/2011     Medication Review:  Current Outpatient Medications (Endocrine & Metabolic):  .  glipiZIDE (GLUCOTROL) 5 MG tablet, TAKE 1/2 OF A TABLET TO ONE TABLET BY MOUTH TWICE A DAY WITH A MEAL FOR DIABETES .  metFORMIN (GLUCOPHAGE-XR) 500 MG 24 hr tablet, Take 1 tablet with Bkfst & Lunch & 2 tablets with Supper for Diabetes  Current Outpatient Medications (Cardiovascular):  .  fenofibrate (TRICOR) 145 MG tablet, TAKE ONE TABLET BY MOUTH DAILY .  losartan-hydrochlorothiazide (HYZAAR) 100-25 MG tablet, Take     1 tablet    Daily     for BP .  rosuvastatin (CRESTOR) 5 MG tablet, TAKE 1 TABLET BY MOUTH ONCE A WEEK IN THE EVENINGS ON SUNDAYS   Current Outpatient Medications (Analgesics):  .  acetaminophen (TYLENOL) 325 MG tablet, Take 2 tablets (650 mg total) by mouth every 4 (four) hours as needed for mild pain ((score 1 to 3) or temp > 100.5). [name]  aspirin EC 81 MG tablet, Take 81 mg by mouth daily. Swallow whole.  Current Outpatient Medications (Other):  .  Blood Glucose Monitoring Suppl DEVI, Use to check blood sugar once daily .  Cholecalciferol 125 MCG (5000 UT) capsule, Take 10,000 Units by mouth daily.  [name]  CINNAMON PO, Take 1,000 mg by mouth daily. .  famotidine (PEPCID) 20 MG tablet, Take 1 tablet (20 mg total) by mouth 2 (two) times daily as needed for heartburn or indigestion. [name]  glucose blood (FREESTYLE LITE) test strip, Test blood sugar once daily .  Lancets MISC, Test blood sugar once daily. .  Magnesium 250 MG TABS, Take 250 mg by mouth at bedtime.  .  Multiple Vitamin (MULTIVITAMIN WITH MINERALS) TABS, Take 1 tablet by mouth daily. .  phentermine (ADIPEX-P) 37.5 MG tablet, Take      1 tablet      Daily       for Dieting & Weight Loss .  vitamin C (ASCORBIC ACID) 500 MG tablet, Take 1,000 mg by mouth daily. [name]  zinc gluconate 50 MG tablet, Take 50 mg by mouth daily.  Allergies: Allergies  Allergen  Reactions  . 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  Surgical: He  has a past surgical history that includes Hernia repair; Cystoscopy/retrograde/ureteroscopy/stone extraction with basket (5/11); Wisdom tooth extraction (yrs ago); Knee surgery (Right, 11/25/11 and 2015); Cholecystectomy (07/27/2012); Total knee arthroplasty (Right, 06/06/2015); Colonoscopy; Anterior lumbar fusion (N/A, 10/07/2019); and Abdominal exposure (N/A, 10/07/2019). Family His family history includes Diabetes in his brother, father, mother, and sister; Hypertension in his mother; Kidney failure in his mother. Social history  He reports that he quit smoking about 14 months ago. His smoking use included cigarettes. He started smoking about 42 years ago. He has a 41.00 pack-year smoking history. He quit smokeless tobacco use about 6 years ago. He reports current alcohol use. He reports that he does not use drugs.   Review of Systems  Constitutional: Negative for malaise/fatigue and weight loss.  HENT: Negative for hearing loss and tinnitus.   Eyes: Negative for blurred vision and double vision.  Respiratory: Negative for cough, shortness of breath and wheezing.   Cardiovascular: Negative for chest pain, palpitations, orthopnea, claudication  and leg swelling.  Gastrointestinal: Negative for abdominal pain, blood in stool, constipation, diarrhea, heartburn, melena, nausea and vomiting.  Genitourinary: Negative.   Musculoskeletal: Negative for joint pain and myalgias.  Skin: Negative for rash.  Neurological: Negative for dizziness, tingling, sensory change, weakness and headaches.  Endo/Heme/Allergies: Negative for polydipsia.  Psychiatric/Behavioral: Negative.   All other systems reviewed and are negative.    Objective:   Today's Vitals   12/04/20 0837  BP: 138/80  Pulse: 84  Temp: 97.7 F (36.5 C)  SpO2: 98%  Weight: 267 lb 9.6 oz (121.4 kg)  Height: 5\' 9"  (1.753 m)   Body mass index is 39.52 kg/m.  General appearance: alert, no distress, WD/WN, morbidly obese male HEENT: normocephalic, sclerae anicteric, TMs pearly,  nares patent, no discharge or erythema, pharynx normal Oral cavity: MMM, no lesions Neck: supple, no lymphadenopathy, no thyromegaly, no masses Heart: RRR, normal S1, S2, no murmurs Lungs: CTA bilaterally, no wheezes, rhonchi, or rales Abdomen: +bs, soft obses, non tender, non distended, no masses, no hepatomegaly, no splenomegaly Musculoskeletal: nontender, no swelling, no obvious deformity Extremities: no edema, no cyanosis, no clubbing Pulses: 2+ symmetric, upper and lower extremities, normal cap refill Neurological: alert, oriented x 3, CN2-12 intact, strength normal upper extremities and lower extremities, sensation normal throughout, DTRs 2+ throughout, no cerebellar signs, gait normal Psychiatric: normal affect, behavior normal, pleasant    Izora Ribas, NP   12/04/2020

## 2020-12-04 ENCOUNTER — Other Ambulatory Visit: Payer: Self-pay

## 2020-12-04 ENCOUNTER — Ambulatory Visit (INDEPENDENT_AMBULATORY_CARE_PROVIDER_SITE_OTHER): Payer: PPO | Admitting: Adult Health

## 2020-12-04 ENCOUNTER — Encounter: Payer: Self-pay | Admitting: Adult Health

## 2020-12-04 VITALS — BP 138/80 | HR 84 | Temp 97.7°F | Ht 69.0 in | Wt 267.6 lb

## 2020-12-04 DIAGNOSIS — E1169 Type 2 diabetes mellitus with other specified complication: Secondary | ICD-10-CM

## 2020-12-04 DIAGNOSIS — Z79899 Other long term (current) drug therapy: Secondary | ICD-10-CM

## 2020-12-04 DIAGNOSIS — E785 Hyperlipidemia, unspecified: Secondary | ICD-10-CM | POA: Diagnosis not present

## 2020-12-04 DIAGNOSIS — E1122 Type 2 diabetes mellitus with diabetic chronic kidney disease: Secondary | ICD-10-CM | POA: Diagnosis not present

## 2020-12-04 DIAGNOSIS — I10 Essential (primary) hypertension: Secondary | ICD-10-CM

## 2020-12-04 DIAGNOSIS — E1121 Type 2 diabetes mellitus with diabetic nephropathy: Secondary | ICD-10-CM

## 2020-12-04 DIAGNOSIS — N181 Chronic kidney disease, stage 1: Secondary | ICD-10-CM | POA: Diagnosis not present

## 2020-12-04 DIAGNOSIS — E559 Vitamin D deficiency, unspecified: Secondary | ICD-10-CM

## 2020-12-04 MED ORDER — TOPIRAMATE 25 MG PO TABS
ORAL_TABLET | ORAL | 1 refills | Status: DC
Start: 2020-12-04 — End: 2021-02-04

## 2020-12-04 NOTE — Patient Instructions (Addendum)
Goals    . Blood Pressure < 130/80    . HEMOGLOBIN A1C < 7.0    . LDL CALC < 70    . Weight (lb) < 240 lb (108.9 kg)       Please get the flu vaccine ASAP   Please check weight weekly and keep a log  Try high fiber diet   Eye doctors that you can call, they are all very close to our office   Dr. Delman Cheadle [phone] Dr. Bing Plume [phone] Dr. Herbert Deaner [phone]     High-Fiber Diet Fiber, also called dietary fiber, is a type of carbohydrate that is found in fruits, vegetables, whole grains, and beans. A high-fiber diet can have many health benefits. Your health care provider may recommend a high-fiber diet to help:  Prevent constipation. Fiber can make your bowel movements more regular.  Lower your cholesterol.  Relieve the following conditions: ? Swelling of veins in the anus (hemorrhoids). ? Swelling and irritation (inflammation) of specific areas of the digestive tract (uncomplicated diverticulosis). ? A problem of the large intestine (colon) that sometimes causes pain and diarrhea (irritable bowel syndrome, IBS).  Prevent overeating as part of a weight-loss plan.  Prevent heart disease, type 2 diabetes, and certain cancers. What is my plan? The recommended daily fiber intake in grams (g) includes:  38 g for men age 74 or younger.  30 g for men over age 10.  74 g for women age 54 or younger.  21 g for women over age 54. You can get the recommended daily intake of dietary fiber by:  Eating a variety of fruits, vegetables, grains, and beans.  Taking a fiber supplement, if it is not possible to get enough fiber through your diet. What do I need to know about a high-fiber diet?  It is better to get fiber through food sources rather than from fiber supplements. There is not a lot of research about how effective supplements are.  Always check the fiber content on the nutrition facts label of any prepackaged food. Look for foods that contain 5 g of fiber or more per  serving.  Talk with a diet and nutrition specialist (dietitian) if you have questions about specific foods that are recommended or not recommended for your medical condition, especially if those foods are not listed below.  Gradually increase how much fiber you consume. If you increase your intake of dietary fiber too quickly, you may have bloating, cramping, or gas.  Drink plenty of water. Water helps you to digest fiber. What are tips for following this plan?  Eat a wide variety of high-fiber foods.  Make sure that half of the grains that you eat each day are whole grains.  Eat breads and cereals that are made with whole-grain flour instead of refined flour or white flour.  Eat brown rice, bulgur wheat, or millet instead of white rice.  Start the day with a breakfast that is high in fiber, such as a cereal that contains 5 g of fiber or more per serving.  Use beans in place of meat in soups, salads, and pasta dishes.  Eat high-fiber snacks, such as berries, raw vegetables, nuts, and popcorn.  Choose whole fruits and vegetables instead of processed forms like juice or sauce. What foods can I eat?  Fruits Berries. Pears. Apples. Oranges. Avocado. Prunes and raisins. Dried figs. Vegetables Sweet potatoes. Spinach. Kale. Artichokes. Cabbage. Broccoli. Cauliflower. Green peas. Carrots. Squash. Grains Whole-grain breads.  Multigrain cereal. Oats and oatmeal. Brown rice. Barley. Bulgur wheat. Ballantine. Quinoa. Bran muffins. Popcorn. Rye wafer crackers. Meats and other proteins Navy, kidney, and pinto beans. Soybeans. Split peas. Lentils. Nuts and seeds. Dairy Fiber-fortified yogurt. Beverages Fiber-fortified soy milk. Fiber-fortified orange juice. Other foods Fiber bars. The items listed above may not be a complete list of recommended foods and beverages. Contact a dietitian for more options. What foods are not recommended? Fruits Fruit juice. Cooked, strained  fruit. Vegetables Fried potatoes. Canned vegetables. Well-cooked vegetables. Grains White bread. Pasta made with refined flour. White rice. Meats and other proteins Fatty cuts of meat. Fried chicken or fried fish. Dairy Milk. Yogurt. Cream cheese. Sour cream. Fats and oils Butters. Beverages Soft drinks. Other foods Cakes and pastries. The items listed above may not be a complete list of foods and beverages to avoid. Contact a dietitian for more information. Summary  Fiber is a type of carbohydrate. It is found in fruits, vegetables, whole grains, and beans.  There are many health benefits of eating a high-fiber diet, such as preventing constipation, lowering blood cholesterol, helping with weight loss, and reducing your risk of heart disease, diabetes, and certain cancers.  Gradually increase your intake of fiber. Increasing too fast can result in cramping, bloating, and gas. Drink plenty of water while you increase your fiber.  The best sources of fiber include whole fruits and vegetables, whole grains, nuts, seeds, and beans. This information is not intended to replace advice given to you by your health care provider. Make sure you discuss any questions you have with your health care provider. Document Revised: 10/06/2017 Document Reviewed: 10/06/2017 Elsevier Patient Education  2020 [employer].

## 2020-12-05 ENCOUNTER — Other Ambulatory Visit: Payer: Self-pay | Admitting: Adult Health

## 2020-12-05 LAB — CBC WITH DIFFERENTIAL/PLATELET
Absolute Monocytes: 681 {cells}/uL (ref 200–950)
Basophils Absolute: 59 {cells}/uL (ref 0–200)
Basophils Relative: 0.8 %
Eosinophils Absolute: 111 {cells}/uL (ref 15–500)
Eosinophils Relative: 1.5 %
HCT: 44.2 % (ref 38.5–50.0)
Hemoglobin: 14.2 g/dL (ref 13.2–17.1)
Lymphs Abs: 1872 {cells}/uL (ref 850–3900)
MCH: 29.8 pg (ref 27.0–33.0)
MCHC: 32.1 g/dL (ref 32.0–36.0)
MCV: 92.9 fL (ref 80.0–100.0)
MPV: 10.6 fL (ref 7.5–12.5)
Monocytes Relative: 9.2 %
Neutro Abs: 4677 {cells}/uL (ref 1500–7800)
Neutrophils Relative %: 63.2 %
Platelets: 232 Thousand/uL (ref 140–400)
RBC: 4.76 Million/uL (ref 4.20–5.80)
RDW: 12.6 % (ref 11.0–15.0)
Total Lymphocyte: 25.3 %
WBC: 7.4 Thousand/uL (ref 3.8–10.8)

## 2020-12-05 LAB — HEMOGLOBIN A1C
Hgb A1c MFr Bld: 7.6 % of total Hgb — ABNORMAL HIGH (ref <5.7)
Mean Plasma Glucose: 171 mg/dL
eAG (mmol/L): 9.5 mmol/L

## 2020-12-05 LAB — COMPLETE METABOLIC PANEL WITH GFR
AG Ratio: 1.9 (calc) (ref 1.0–2.5)
ALT: 30 U/L (ref 9–46)
AST: 16 U/L (ref 10–35)
Albumin: 4.5 g/dL (ref 3.6–5.1)
Alkaline phosphatase (APISO): 51 U/L (ref 35–144)
BUN: 22 mg/dL (ref 7–25)
CO2: 29 mmol/L (ref 20–32)
Calcium: 10.6 mg/dL — ABNORMAL HIGH (ref 8.6–10.3)
Chloride: 104 mmol/L (ref 98–110)
Creat: 1.08 mg/dL (ref 0.70–1.25)
GFR, Est African American: 83 mL/min/{1.73_m2} (ref >=60)
GFR, Est Non African American: 72 mL/min/{1.73_m2} (ref >=60)
Globulin: 2.4 g/dL (calc) (ref 1.9–3.7)
Glucose, Bld: 280 mg/dL — ABNORMAL HIGH (ref 65–99)
Potassium: 4.9 mmol/L (ref 3.5–5.3)
Sodium: 141 mmol/L (ref 135–146)
Total Bilirubin: 0.2 mg/dL (ref 0.2–1.2)
Total Protein: 6.9 g/dL (ref 6.1–8.1)

## 2020-12-05 LAB — VITAMIN D 25 HYDROXY (VIT D DEFICIENCY, FRACTURES): Vit D, 25-Hydroxy: 76 ng/mL (ref 30–100)

## 2020-12-05 LAB — LIPID PANEL
Cholesterol: 143 mg/dL (ref <200)
HDL: 34 mg/dL — ABNORMAL LOW (ref >=40)
LDL Cholesterol (Calc): 68 mg/dL (calc)
Non-HDL Cholesterol (Calc): 109 mg/dL (calc) (ref <130)
Total CHOL/HDL Ratio: 4.2 (calc) (ref <5.0)
Triglycerides: 340 mg/dL — ABNORMAL HIGH (ref <150)

## 2020-12-05 LAB — TSH: TSH: 4.28 mIU/L (ref 0.40–4.50)

## 2020-12-05 LAB — PARATHYROID HORMONE, INTACT (NO CA): PTH: 18 pg/mL (ref 14–64)

## 2020-12-05 LAB — MAGNESIUM: Magnesium: 1.6 mg/dL (ref 1.5–2.5)

## 2020-12-30 ENCOUNTER — Other Ambulatory Visit: Payer: Self-pay | Admitting: Internal Medicine

## 2020-12-30 DIAGNOSIS — E1121 Type 2 diabetes mellitus with diabetic nephropathy: Secondary | ICD-10-CM

## 2020-12-30 DIAGNOSIS — E1122 Type 2 diabetes mellitus with diabetic chronic kidney disease: Secondary | ICD-10-CM

## 2020-12-30 DIAGNOSIS — N181 Chronic kidney disease, stage 1: Secondary | ICD-10-CM

## 2020-12-30 DIAGNOSIS — I10 Essential (primary) hypertension: Secondary | ICD-10-CM

## 2020-12-30 MED ORDER — LOSARTAN POTASSIUM-HCTZ 100-25 MG PO TABS: ORAL_TABLET | ORAL | 0 refills | Status: DC

## 2021-01-02 ENCOUNTER — Other Ambulatory Visit: Payer: Self-pay | Admitting: Internal Medicine

## 2021-01-02 MED ORDER — GLIPIZIDE 5 MG PO TABS: ORAL_TABLET | ORAL | 0 refills | Status: DC

## 2021-01-07 ENCOUNTER — Other Ambulatory Visit: Payer: Self-pay | Admitting: Internal Medicine

## 2021-01-07 DIAGNOSIS — E119 Type 2 diabetes mellitus without complications: Secondary | ICD-10-CM

## 2021-02-04 ENCOUNTER — Other Ambulatory Visit: Payer: Self-pay | Admitting: Adult Health

## 2021-03-29 NOTE — Progress Notes (Signed)
3 MONTH FOLLOW UP Assessment:    Essential hypertension Monitor blood pressure at home; call if consistently over 130/80 Continue DASH diet.   Reminder to go to the ER if any CP, SOB, nausea, dizziness, severe HA, changes vision/speech, left arm numbness and tingling and jaw pain. -     CBC with Differential/Platelet -     COMPLETE METABOLIC PANEL WITH GFR -     Lipid panel  Leg DVT (deep venous thromboembolism), chronic, left (HCC) Chronic; vascular evaluated and advised stop elequis; monitor  Type 2 diabetes mellitus with stage 1 chronic kidney disease, without long-term current use of insulin [facility]) Education: Reviewed 'ABCs' of diabetes management (respective goals in parentheses):  A1C (<7), blood pressure (<130/80), and cholesterol (LDL <70) Eye Exam yearly and Dental Exam every 6 months.- overdue eye exam, reminded to schedule, will try to set up for [facility] exam day Dietary recommendations Physical Activity recommendations -     Hemoglobin A1c  Hyperlipidemia associated with type 2 diabetes mellitus ([place]) Titrate for LDL goal <70 Continue low cholesterol diet and exercise.  Check lipid panel.  -     Lipid panel -     TSH  CKD stage 1 due to type 2 diabetes mellitus (HCC) Increase fluids, avoid NSAIDS, monitor sugars, will monitor -     COMPLETE METABOLIC PANEL WITH GFR  Microalbuminuric diabetic nephropathy (HCC) Increase fluids, avoid NSAIDS, monitor sugars, will monitor; on ACE/ARB  Gastroesophageal reflux disease, unspecified whether esophagitis present Well managed on current medications Discussed diet, avoiding triggers and other lifestyle changes  Fatty liver Weight loss advised, avoid alcohol/tylenol, will monitor LFTs Dr. Paulita Fujita following -     COMPLETE METABOLIC PANEL WITH GFR  Hepatomegaly Dr. 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  Discussed risks of screening including but not limited to false positives on xray, further testing or consultation with specialist, and possible false negative CT as well. Understanding expressed and wishes to defer at this time.   Vitamin D deficiency At goal at recent check; continue to recommend supplementation for goal of 60-100 Defer vitamin D level  Morbid obesity ([place]) Long discussion about weight loss, diet, and exercise Discussed final goal weight  Patient on topamax with benefit; discussed can also take phentermine in AM. Refilled - Increase fiber, healthy fats/protein with each meal   Follow up in 3 months  Medication management -     CBC with Differential/Platelet -     COMPLETE METABOLIC PANEL WITH GFR -     Magnesium  Hypercalcemia - check 1, 25 dihidroxy, PTHrp  Over 30 minutes of exam, counseling, chart review, and critical decision making was performed  Future Appointments  Date Time Provider [place]  07/04/2021  9:00 AM [name], NP [facility] None  10/04/2021  9:30 AM [name], NP [facility] None     Subjective:  Aaron Burns is a 65 y.o. male who presents for 3 month follow up for HTN, hyperlipidemia, T2DM with diabetic nephropathy, and vitamin D Def.   He has lumbar fusion in 09/2019 by Dr. Rolena Infante, does have some residual pain limited to lower back without radicular sx. Takes 2 tab regular tylenol but limited benefit. Was recommended weight loss.   Had DVT complication after surgery - Notably saw Dr. Sherren Mocha Early in Juy 2021 for left chronic DVT persistent despite xarelto  x 6+ months, was advised to taper off, no further follow up unless new sx.   Hx of elevated LFTs; [place] 04/2020 showed hepatomegaly and diffuse hepatic steatosis, several small hyperecchoic masses within spleen, possible hemangiomas - he was referred to GI Dr. 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   He is former smoker, quit in 2020, 40 pack year history. Have discussed low dose CT for lung cancer screening but declines at this time.   BMI is Body mass index is 38.84 kg/m., he has been working on diet and exercise. Prescribed phentermine with benefit when takes, no SE but reports hasn't been taking recently, down 4 lb since last visit, has been taking topamax only.   Wt Readings from Last 3 Encounters:  04/02/21 263 lb (119.3 kg)  12/04/20 267 lb 9.6 oz (121.4 kg)  10/02/20 263 lb (119.3 kg)   His blood pressure has been controlled at home, today their BP is BP: 130/74 He does not workout. He denies chest pain, shortness of breath, dizziness.    He is on cholesterol medication (rosuvastatin 5 mg and and fenofibrate 145 mg daily) denies myalgias. His cholesterol is at goal. Reports rare alcohol. The cholesterol last visit was:   Lab Results  Component Value Date   CHOL 143 12/04/2020   HDL 34 (L) 12/04/2020   LDLCALC 68 12/04/2020   TRIG 340 (H) 12/04/2020   CHOLHDL 4.2 12/04/2020   He has been working on diet and exercise for T2 diabetes, on metformin 2000 mg daily, glipizide 5 mg BID, and denies hyperglycemia, hypoglycemia , increased appetite, nausea, polydipsia, polyuria and visual disturbances.  He reports fasting ranges 130-140s.  He does have mild tingling in bil toes Last A1C in the office was:  Lab Results  Component Value Date   HGBA1C 7.6 (H) 12/04/2020   He has CKD 1 associated with htn and T2DM monitored at this office. He is on ARB. Last GFR Lab Results  Component Value Date   GFRNONAA 72 12/04/2020   Patient is on Vitamin D supplement.   Lab Results  Component Value Date   VD25OH 76 12/04/2020     He denies taking any calcium supplement, does report occasional tums use:  Lab Results  Component Value Date   PTH 18 12/04/2020   CALCIUM 10.6 (H) 12/04/2020   CAION 1.16 11/25/2011     Medication Review:  Current Outpatient  Medications (Endocrine & Metabolic):  .  glipiZIDE (GLUCOTROL) 5 MG tablet, Take   1/2 to 1 tablet   2 x /day    with Meals    for Diabetes .  metFORMIN (GLUCOPHAGE-XR) 500 MG 24 hr tablet, Take   2 tablets   2 x /day    with Meals    for Diabetes  Current Outpatient Medications (Cardiovascular):  .  fenofibrate (TRICOR) 145 MG tablet, TAKE ONE TABLET BY MOUTH DAILY .  losartan-hydrochlorothiazide (HYZAAR) 100-25 MG tablet, Take     1 tablet    Daily     for BP .  rosuvastatin (CRESTOR) 5 MG tablet, TAKE 1 TABLET BY MOUTH ONCE A WEEK IN THE EVENINGS ON SUNDAYS   Current Outpatient Medications (Analgesics):  .  acetaminophen (TYLENOL) 325 MG tablet, Take 2 tablets (650 mg total) by mouth every 4 (four) hours as needed for mild pain ((score 1 to 3) or temp > 100.5). [name]  aspirin EC 81 MG tablet, Take 81 mg by mouth daily. Swallow whole.  Current Outpatient Medications (Other):  .  Blood Glucose Monitoring Suppl DEVI, Use to check blood sugar once daily .  Cholecalciferol 125 MCG (5000 UT) capsule, Take 10,000 Units by mouth daily.  [name]  CINNAMON PO, Take 1,000 mg by mouth daily. .  famotidine (PEPCID) 20 MG tablet, Take 1 tablet (20 mg total) by mouth 2 (two) times daily as needed for heartburn or indigestion. [name]  glucose blood (FREESTYLE LITE) test strip, Test blood sugar once daily .  Lancets MISC, Test blood sugar once daily. .  Magnesium 250 MG TABS, Take 250 mg by mouth at bedtime.  .  Multiple Vitamin (MULTIVITAMIN WITH MINERALS) TABS, Take 1 tablet by mouth daily. [name]  topiramate (TOPAMAX) 25 MG tablet, Take  1 to 2 tablets  at Night  for Dieting & Weight Loss .  vitamin C (ASCORBIC ACID) 500 MG tablet, Take 1,000 mg by mouth daily. [name]  zinc gluconate 50 MG tablet, Take 50 mg by mouth daily. .  phentermine (ADIPEX-P) 37.5 MG tablet, Take      1 tablet      Daily       for Dieting & Weight Loss (Patient not taking: Reported on 04/02/2021)  Allergies: Allergies  Allergen Reactions  . 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  Surgical: He  has a past surgical history that includes Hernia repair; Cystoscopy/retrograde/ureteroscopy/stone extraction with basket (5/11); Wisdom tooth extraction (yrs ago); Knee surgery (Right, 11/25/11 and 2015); Cholecystectomy (07/27/2012); Total knee arthroplasty (Right, 06/06/2015); Colonoscopy; Anterior lumbar fusion (N/A, 10/07/2019); and Abdominal exposure (N/A, 10/07/2019). Family His family history includes Diabetes in his brother, father, mother, and sister; Hypertension in his mother; Kidney failure in his mother. Social history  He reports that he quit smoking about 18 months ago. His smoking use included cigarettes. He started smoking about 43 years ago. He has a 41.00 pack-year smoking history. He quit smokeless tobacco use about 6 years ago. He reports current alcohol use. He reports that he does not use drugs.   Review of Systems  Constitutional: Negative for malaise/fatigue and weight loss.  HENT: Negative for hearing loss and tinnitus.   Eyes: Negative for blurred vision and double vision.  Respiratory: Negative for cough, shortness of breath and wheezing.   Cardiovascular: Negative for chest pain, palpitations, orthopnea, claudication and leg swelling.   Gastrointestinal: Negative for abdominal pain, blood in stool, constipation, diarrhea, heartburn, melena, nausea and vomiting.  Genitourinary: Negative.   Musculoskeletal: Positive for back pain (lumbar with extended walking). Negative for joint pain and myalgias.  Skin: Negative for rash.  Neurological: Negative for dizziness, tingling, sensory change, weakness and headaches.  Endo/Heme/Allergies: Negative for polydipsia.  Psychiatric/Behavioral: Negative.   All other systems reviewed and are negative.    Objective:   Today's Vitals   04/02/21 0927  BP: 130/74  Pulse: 76  Temp: (!) 97.3 F (36.3 C)  SpO2: 97%  Weight: 263 lb (119.3 kg)  Height: 5\' 9"  (1.753 m)   Body mass index is 38.84 kg/m.  General appearance: alert, no distress, WD/WN, morbidly obese male HEENT: normocephalic, sclerae anicteric, TMs pearly, nares patent, no discharge or erythema, pharynx normal Oral cavity: MMM, no lesions Neck: supple, no lymphadenopathy, no thyromegaly, no masses Heart: RRR, normal S1, S2, no murmurs Lungs: CTA bilaterally, no wheezes, rhonchi, or rales Abdomen: +bs, soft obses, non tender, non distended, no masses, no hepatomegaly, no splenomegaly Musculoskeletal: nontender, no swelling, no obvious deformity Extremities: no edema, no cyanosis, no clubbing Pulses: 2+ symmetric, upper and lower extremities, normal cap refill Neurological: alert, oriented x 3, CN2-12 intact, strength normal upper extremities and lower extremities, sensation normal throughout, DTRs 2+ throughout, no cerebellar signs, gait normal Psychiatric: normal affect, behavior normal, pleasant    Izora Ribas, NP   04/02/2021

## 2021-04-02 ENCOUNTER — Other Ambulatory Visit: Payer: Self-pay

## 2021-04-02 ENCOUNTER — Ambulatory Visit (INDEPENDENT_AMBULATORY_CARE_PROVIDER_SITE_OTHER): Payer: PPO | Admitting: Adult Health

## 2021-04-02 ENCOUNTER — Encounter: Payer: Self-pay | Admitting: Adult Health

## 2021-04-02 VITALS — BP 130/74 | HR 76 | Temp 97.3°F | Ht 69.0 in | Wt 263.0 lb

## 2021-04-02 DIAGNOSIS — E1169 Type 2 diabetes mellitus with other specified complication: Secondary | ICD-10-CM | POA: Diagnosis not present

## 2021-04-02 DIAGNOSIS — Z79899 Other long term (current) drug therapy: Secondary | ICD-10-CM | POA: Diagnosis not present

## 2021-04-02 DIAGNOSIS — E1122 Type 2 diabetes mellitus with diabetic chronic kidney disease: Secondary | ICD-10-CM

## 2021-04-02 DIAGNOSIS — I82502 Chronic embolism and thrombosis of unspecified deep veins of left lower extremity: Secondary | ICD-10-CM

## 2021-04-02 DIAGNOSIS — E559 Vitamin D deficiency, unspecified: Secondary | ICD-10-CM | POA: Diagnosis not present

## 2021-04-02 DIAGNOSIS — K760 Fatty (change of) liver, not elsewhere classified: Secondary | ICD-10-CM | POA: Diagnosis not present

## 2021-04-02 DIAGNOSIS — E785 Hyperlipidemia, unspecified: Secondary | ICD-10-CM

## 2021-04-02 DIAGNOSIS — N181 Chronic kidney disease, stage 1: Secondary | ICD-10-CM | POA: Diagnosis not present

## 2021-04-02 DIAGNOSIS — I10 Essential (primary) hypertension: Secondary | ICD-10-CM

## 2021-04-02 MED ORDER — PHENTERMINE HCL 37.5 MG PO TABS: ORAL_TABLET | ORAL | 5 refills | Status: DC

## 2021-04-02 NOTE — Patient Instructions (Addendum)
Goals    . Blood Pressure < 130/80    . HEMOGLOBIN A1C < 7.0    . LDL CALC < 70    . Weight (lb) < 240 lb (108.9 kg)         Try adding nuts/seeds instead of croutons- healthy fats and protein - aim for 1-2 ounces of nuts/seeds per day  Increase beans/lentils -   Work up to 1/2-1 cup of beans most days - can add to salads, or do bean soup, beans on brown rice or baked sweet potato, lentil soup, bean and [place] chili  Can try hummus (garbanzo bean spread) on sandwhiches instead of mayo, or as a healthy dip for veggies      High-Fiber Eating Plan Fiber, also called dietary fiber, is a type of carbohydrate. It is found foods such as fruits, vegetables, whole grains, and beans. A high-fiber diet can have many health benefits. Your health care provider may recommend a high-fiber diet to help:  Prevent constipation. Fiber can make your bowel movements more regular.  Lower your cholesterol.  Relieve the following conditions: ? Inflammation of veins in the anus (hemorrhoids). ? Inflammation of specific areas of the digestive tract (uncomplicated diverticulosis). ? A problem of the large intestine, also called the colon, that sometimes causes pain and diarrhea (irritable bowel syndrome, or IBS).  Prevent overeating as part of a weight-loss plan.  Prevent heart disease, type 2 diabetes, and certain cancers. What are tips for following this plan? Reading food labels  Check the nutrition facts label on food products for the amount of dietary fiber. Choose foods that have 5 grams of fiber or more per serving.  The goals for recommended daily fiber intake include: ? Men (age 55 or younger): 34-38 g. ? Men (over age 23): 28-34 g. ? Women (age 29 or younger): 25-28 g. ? Women (over age 68): 22-25 g. Your daily fiber goal is _____________ g.   Shopping  Choose whole fruits and vegetables instead of processed forms, such as apple juice or applesauce.  Choose a wide variety of  high-fiber foods such as avocados, lentils, oats, and kidney beans.  Read the nutrition facts label of the foods you choose. Be aware of foods with added fiber. These foods often have high sugar and sodium amounts per serving. Cooking  Use whole-grain flour for baking and cooking.  Cook with brown rice instead of white rice. Meal planning  Start the day with a breakfast that is high in fiber, such as a cereal that contains 5 g of fiber or more per serving.  Eat breads and cereals that are made with whole-grain flour instead of refined flour or white flour.  Eat brown rice, bulgur wheat, or millet instead of white rice.  Use beans in place of meat in soups, salads, and pasta dishes.  Be sure that half of the grains you eat each day are whole grains. General information  You can get the recommended daily intake of dietary fiber by: ? Eating a variety of fruits, vegetables, grains, nuts, and beans. ? Taking a fiber supplement if you are not able to take in enough fiber in your diet. It is better to get fiber through food than from a supplement.  Gradually increase how much fiber you consume. If you increase your intake of dietary fiber too quickly, you may have bloating, cramping, or gas.  Drink plenty of water to help you digest fiber.  Choose high-fiber snacks, such as berries, raw vegetables,  nuts, and popcorn. What foods should I eat? Fruits Berries. Pears. Apples. Oranges. Avocado. Prunes and raisins. Dried figs. Vegetables Sweet potatoes. Spinach. Kale. Artichokes. Cabbage. Broccoli. Cauliflower. Green peas. Carrots. Squash. Grains Whole-grain breads. Multigrain cereal. Oats and oatmeal. Brown rice. Barley. Bulgur wheat. [place]. Quinoa. Bran muffins. Popcorn. Rye wafer crackers. Meats and other proteins Navy beans, kidney beans, and pinto beans. Soybeans. Split peas. Lentils. Nuts and seeds. Dairy Fiber-fortified yogurt. Beverages Fiber-fortified soy milk. Fiber-fortified  orange juice. Other foods Fiber bars. The items listed above may not be a complete list of recommended foods and beverages. Contact a dietitian for more information. What foods should I avoid? Fruits Fruit juice. Cooked, strained fruit. Vegetables Fried potatoes. Canned vegetables. Well-cooked vegetables. Grains White bread. Pasta made with refined flour. White rice. Meats and other proteins Fatty cuts of meat. Fried chicken or fried fish. Dairy Milk. Yogurt. Cream cheese. Sour cream. Fats and oils Butters. Beverages Soft drinks. Other foods Cakes and pastries. The items listed above may not be a complete list of foods and beverages to avoid. Talk with your dietitian about what choices are best for you. Summary  Fiber is a type of carbohydrate. It is found in foods such as fruits, vegetables, whole grains, and beans.  A high-fiber diet has many benefits. It can help to prevent constipation, lower blood cholesterol, aid weight loss, and reduce your risk of heart disease, diabetes, and certain cancers.  Increase your intake of fiber gradually. Increasing fiber too quickly may cause cramping, bloating, and gas. Drink plenty of water while you increase the amount of fiber you consume.  The best sources of fiber include whole fruits and vegetables, whole grains, nuts, seeds, and beans. This information is not intended to replace advice given to you by your health care provider. Make sure you discuss any questions you have with your health care provider. Document Revised: 04/06/2020 Document Reviewed: 04/06/2020 Elsevier Patient Education  2021 [employer].

## 2021-04-03 ENCOUNTER — Other Ambulatory Visit: Payer: Self-pay | Admitting: Adult Health

## 2021-04-03 DIAGNOSIS — N289 Disorder of kidney and ureter, unspecified: Secondary | ICD-10-CM

## 2021-04-04 ENCOUNTER — Other Ambulatory Visit: Payer: Self-pay | Admitting: Internal Medicine

## 2021-04-04 DIAGNOSIS — I10 Essential (primary) hypertension: Secondary | ICD-10-CM

## 2021-04-04 DIAGNOSIS — E1121 Type 2 diabetes mellitus with diabetic nephropathy: Secondary | ICD-10-CM

## 2021-04-04 DIAGNOSIS — N181 Chronic kidney disease, stage 1: Secondary | ICD-10-CM

## 2021-04-09 ENCOUNTER — Other Ambulatory Visit: Payer: Self-pay | Admitting: Internal Medicine

## 2021-04-09 MED ORDER — TOPIRAMATE 25 MG PO TABS: ORAL_TABLET | ORAL | 3 refills | Status: DC

## 2021-04-10 LAB — CBC WITH DIFFERENTIAL/PLATELET
Absolute Monocytes: 761 cells/uL (ref 200–950)
Basophils Absolute: 73 cells/uL (ref 0–200)
Basophils Relative: 0.9 %
Eosinophils Absolute: 138 cells/uL (ref 15–500)
Eosinophils Relative: 1.7 %
HCT: 40.5 % (ref 38.5–50.0)
Hemoglobin: 13.5 g/dL (ref 13.2–17.1)
Lymphs Abs: 1985 cells/uL (ref 850–3900)
MCH: 30.5 pg (ref 27.0–33.0)
MCHC: 33.3 g/dL (ref 32.0–36.0)
MCV: 91.6 fL (ref 80.0–100.0)
MPV: 10.7 fL (ref 7.5–12.5)
Monocytes Relative: 9.4 %
Neutro Abs: 5144 cells/uL (ref 1500–7800)
Neutrophils Relative %: 63.5 %
Platelets: 232 10*3/uL (ref 140–400)
RBC: 4.42 10*6/uL (ref 4.20–5.80)
RDW: 13.2 % (ref 11.0–15.0)
Total Lymphocyte: 24.5 %
WBC: 8.1 10*3/uL (ref 3.8–10.8)

## 2021-04-10 LAB — COMPLETE METABOLIC PANEL WITH GFR
AG Ratio: 1.7 (calc) (ref 1.0–2.5)
ALT: 22 U/L (ref 9–46)
AST: 14 U/L (ref 10–35)
Albumin: 4.4 g/dL (ref 3.6–5.1)
Alkaline phosphatase (APISO): 45 U/L (ref 35–144)
BUN/Creatinine Ratio: 27 (calc) — ABNORMAL HIGH (ref 6–22)
BUN: 39 mg/dL — ABNORMAL HIGH (ref 7–25)
CO2: 26 mmol/L (ref 20–32)
Calcium: 10.3 mg/dL (ref 8.6–10.3)
Chloride: 105 mmol/L (ref 98–110)
Creat: 1.46 mg/dL — ABNORMAL HIGH (ref 0.70–1.25)
GFR, Est African American: 57 mL/min/{1.73_m2} — ABNORMAL LOW (ref >=60)
GFR, Est Non African American: 49 mL/min/{1.73_m2} — ABNORMAL LOW (ref >=60)
Globulin: 2.6 g/dL (calc) (ref 1.9–3.7)
Glucose, Bld: 151 mg/dL — ABNORMAL HIGH (ref 65–99)
Potassium: 4.6 mmol/L (ref 3.5–5.3)
Sodium: 143 mmol/L (ref 135–146)
Total Bilirubin: 0.4 mg/dL (ref 0.2–1.2)
Total Protein: 7 g/dL (ref 6.1–8.1)

## 2021-04-10 LAB — HEMOGLOBIN A1C
Hgb A1c MFr Bld: 7.5 % of total Hgb — ABNORMAL HIGH (ref <5.7)
Mean Plasma Glucose: 169 mg/dL
eAG (mmol/L): 9.3 mmol/L

## 2021-04-10 LAB — LIPID PANEL
Cholesterol: 122 mg/dL (ref <200)
HDL: 31 mg/dL — ABNORMAL LOW (ref >=40)
LDL Cholesterol (Calc): 58 mg/dL (calc)
Non-HDL Cholesterol (Calc): 91 mg/dL (calc) (ref <130)
Total CHOL/HDL Ratio: 3.9 (calc) (ref <5.0)
Triglycerides: 281 mg/dL — ABNORMAL HIGH (ref <150)

## 2021-04-10 LAB — VITAMIN D 1,25 DIHYDROXY
Vitamin D 1, 25 (OH)2 Total: 32 pg/mL (ref 18–72)
Vitamin D2 1, 25 (OH)2: <8 pg/mL
Vitamin D3 1, 25 (OH)2: 32 pg/mL

## 2021-04-10 LAB — PTH-RELATED PEPTIDE: PTH-Related Protein (PTH-RP): 15 pg/mL (ref 11–20)

## 2021-04-10 LAB — TSH: TSH: 3.28 mIU/L (ref 0.40–4.50)

## 2021-04-10 LAB — MAGNESIUM: Magnesium: 1.6 mg/dL (ref 1.5–2.5)

## 2021-05-07 ENCOUNTER — Encounter: Payer: PPO | Admitting: Adult Health

## 2021-05-07 ENCOUNTER — Ambulatory Visit (INDEPENDENT_AMBULATORY_CARE_PROVIDER_SITE_OTHER): Payer: PPO | Admitting: *Deleted

## 2021-05-07 ENCOUNTER — Other Ambulatory Visit: Payer: Self-pay

## 2021-05-07 DIAGNOSIS — N289 Disorder of kidney and ureter, unspecified: Secondary | ICD-10-CM | POA: Diagnosis not present

## 2021-05-07 NOTE — Progress Notes (Signed)
Patient here for a NV to recheck BMET with GFR, due to reduced kidney functions. Patient states he does not take NSAIDS, only Tylenol. States he drinks a 32 ounce cup of water x about 5 cups daily.

## 2021-05-08 LAB — BASIC METABOLIC PANEL WITH GFR
BUN/Creatinine Ratio: 20 (calc) (ref 6–22)
BUN: 25 mg/dL (ref 7–25)
CO2: 27 mmol/L (ref 20–32)
Calcium: 10.5 mg/dL — ABNORMAL HIGH (ref 8.6–10.3)
Chloride: 105 mmol/L (ref 98–110)
Creat: 1.26 mg/dL — ABNORMAL HIGH (ref 0.70–1.25)
GFR, Est African American: 68 mL/min/{1.73_m2} (ref >=60)
GFR, Est Non African American: 59 mL/min/{1.73_m2} — ABNORMAL LOW (ref >=60)
Glucose, Bld: 178 mg/dL — ABNORMAL HIGH (ref 65–99)
Potassium: 5.1 mmol/L (ref 3.5–5.3)
Sodium: 142 mmol/L (ref 135–146)

## 2021-06-08 ENCOUNTER — Other Ambulatory Visit: Payer: Self-pay | Admitting: Internal Medicine

## 2021-06-08 DIAGNOSIS — E1122 Type 2 diabetes mellitus with diabetic chronic kidney disease: Secondary | ICD-10-CM

## 2021-06-08 MED ORDER — GLIPIZIDE 5 MG PO TABS: ORAL_TABLET | ORAL | 3 refills | Status: DC

## 2021-07-03 ENCOUNTER — Other Ambulatory Visit: Payer: Self-pay | Admitting: Adult Health

## 2021-07-03 NOTE — Progress Notes (Signed)
CPE AND FOLLOW UP Assessment:   Diagnoses and all orders for this visit:  Encounter for Annual Physical Exam with abnormal findings Due annually  Health Maintenance reviewed Healthy lifestyle reviewed and goals set  Essential hypertension Monitor blood pressure at home; call if consistently over 130/80 Continue DASH diet.   Reminder to go to the ER if any CP, SOB, nausea, dizziness, severe HA, changes vision/speech, left arm numbness and tingling and jaw pain.  Leg DVT (deep venous thromboembolism), chronic, left (HCC) Chronic; vascular evaluated and advised stop elequis; monitor  Type 2 diabetes mellitus with stage 1 chronic kidney disease, without long-term current use of insulin [facility]) Education: Reviewed 'ABCs' of diabetes management (respective goals in parentheses):  A1C (<7), blood pressure (<130/80), and cholesterol (LDL <70) Eye Exam yearly and Dental Exam every 6 months.- overdue eye exam, reminded to schedule, numbers given Dietary recommendations Physical Activity recommendations Discussed possible benefit from ozempic/trulicity instead of glipizide, would get financial assistance, would consider but declines this visit.  Hemoglobin A1c q58m  Hyperlipidemia associated with type 2 diabetes mellitus (HCC) Titrate for LDL goal <70 Continue low cholesterol diet and exercise.  Check lipid panel.   CKD stage 1 due to type 2 diabetes mellitus (HCC) Increase fluids, avoid NSAIDS, monitor sugars, will monitor  Microalbuminuric diabetic nephropathy (HCC) Increase fluids, avoid NSAIDS, monitor sugars, will monitor; on ACE/ARB  Gastroesophageal reflux disease, unspecified whether esophagitis present Well managed on current medications Discussed diet, avoiding triggers and other lifestyle changes  Fatty liver Weight loss advised, avoid alcohol/tylenol, low carb diet, will monitor LFTs Dr. Paulita Fujita following  Hepatomegaly Dr. Paulita Fujita following, avoid tylenol, alcohol, weight  loss encouraged  Former heavy tobacco smoker (41 pack year, quit 2020) -lung cancer screening with low dose CT discussed as recommended by guidelines based on age, number of pack year history.  Discussed risks of screening including but not limited to false positives on xray, further testing or consultation with specialist, and possible false negative CT as well. Understanding expressed and wishes to proceed if well covered by insurance.   Vitamin D deficiency At goal at recent check; continue to recommend supplementation for goal of 60-100 Check vitamin D annually and PRN  Morbid obesity ([place]) Long discussion about weight loss, diet, and exercise Discussed final goal weight  Patient on phentermine/topamax with benefit and no SE, taking drug breaks; continue close follow up. Return in 3 months  Medication management Due at each visit   Orders Placed This Encounter  Procedures   CT CHEST LUNG CA SCREEN LOW DOSE W/O CM   CBC with Differential/Platelet   COMPLETE METABOLIC PANEL WITH GFR   Magnesium   Lipid panel   TSH   Hemoglobin A1c   VITAMIN D 25 Hydroxy (Vit-D Deficiency, Fractures)   Microalbumin / creatinine urine ratio   Urinalysis, Routine w reflex microscopic   PSA   EKG 12-Lead   HM DIABETES FOOT EXAM      Over 30 minutes of exam, counseling, chart review, and critical decision making was performed  Future Appointments  Date Time Provider [place]  10/04/2021  9:30 AM [name], NP [facility] None  07/04/2022  9:00 AM [name], NP [facility] None     Plan:   During the course of the visit the patient was educated and counseled about appropriate screening and preventive services including:   Pneumococcal vaccine  Influenza vaccine Prevnar 13 Td vaccine Screening electrocardiogram Colorectal cancer screening Diabetes screening Glaucoma screening Nutrition counseling  Subjective:  Aaron Burns is a 65 y.o. male who presents for  CPE and 3 month follow up for HTN, hyperlipidemia, T2DM with diabetic nephropathy, and vitamin D Def.   He is married, 2 kids, no grandchildren. 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   He has lumbar fusion in 09/2019 by Dr. Rolena Infante, does have some residual pain limited to lower back without radicular sx. Takes 2 tab regular tylenol but limited benefit. Was recommended weight loss. Had DVT complication after surgery - Notably saw Dr. Sherren Mocha Early in Juy 2021 for left chronic DVT persistent despite xarelto x 6+ months, was advised to taper off, no further follow up unless new sx.   Hx of elevated LFTs; [place] 04/2020 showed hepatomegaly and diffuse hepatic steatosis, several small hyperecchoic masses within spleen, possible hemangiomas - he was referred to GI Dr. 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   He is former smoker, quit in 2020, 40 pack year history. Have discussed low dose CT for lung cancer screening but has been declining, today is agreeable after extended discucssion of benefits of early detection.   BMI is Body mass index is 37.41 kg/m., he has been working on diet and exercise. Prescribed phentermine/topamax with benefit when takes, no SE., takes regular breaks. He joined a Pharmacist, community. He has jointed silver sneakers but doesn't go too frequently, trying to increase, trying to get wife to join him.  Wt Readings from Last 3 Encounters:  07/04/21 255 lb 3.2 oz (115.8 kg)  05/07/21 258 lb 12.8 oz (117.4 kg)  04/02/21 263 lb (119.3 kg)   His blood pressure has been controlled at home, today their BP is BP: 122/72 He does workout. He denies chest pain, shortness of breath, dizziness.    He is on cholesterol medication (rosuvastatin 5 mg and and fenofibrate 145 mg daily) denies myalgias. His cholesterol is not at goal. Reports rare alcohol. The cholesterol last visit was:   Lab Results  Component Value  Date   CHOL 122 04/02/2021   HDL 31 (L) 04/02/2021   LDLCALC 58 04/02/2021   TRIG 281 (H) 04/02/2021   CHOLHDL 3.9 04/02/2021   He has been working on diet and exercise for T2 diabetes, on metformin 2000 mg daily, glipizide 5 mg BID, and denies hyperglycemia, hypoglycemia , increased appetite, nausea, polydipsia, polyuria and visual disturbances.  He reports fasting running higher - 150-160 He does have mild tingling in bil toes. Last A1C in the office was:  Lab Results  Component Value Date   HGBA1C 7.5 (H) 04/02/2021   He has CKD 1 associated with htn and T2DM monitored at this office. He is on ARB. Last GFR Lab Results  Component Value Date   GFRNONAA 98 (L) 05/07/2021   Patient is on Vitamin D supplement.   Lab Results  Component Value Date   VD25OH 52 12/04/2020    Denies LUTs. Last PSA was: Lab Results  Component Value Date   PSA 0.6 05/04/2020      Medication Review:  Current Outpatient Medications (Endocrine & Metabolic):    glipiZIDE (GLUCOTROL) 5 MG tablet, Take   1/2 to 1 tablet   2 x /day    with Meals    for Diabetes   metFORMIN (GLUCOPHAGE-XR) 500 MG 24 hr tablet, Take   2 tablets   2 x /day    with Meals    for Diabetes  Current Outpatient Medications (Cardiovascular):  fenofibrate (TRICOR) 145 MG tablet, TAKE ONE TABLET BY MOUTH DAILY   losartan-hydrochlorothiazide (HYZAAR) 100-25 MG tablet, TAKE ONE TABLET BY MOUTH DAILY FOR BLOOD PRESSURE   rosuvastatin (CRESTOR) 5 MG tablet, TAKE ONE TABLET BY MOUTH ONCE WEEKLY IN THE EVENINGS ON SUNDAYS   Current Outpatient Medications (Analgesics):    acetaminophen (TYLENOL) 325 MG tablet, Take 2 tablets (650 mg total) by mouth every 4 (four) hours as needed for mild pain ((score 1 to 3) or temp > 100.5).   aspirin EC 81 MG tablet, Take 81 mg by mouth daily. Swallow whole.   Current Outpatient Medications (Other):    Blood Glucose Monitoring Suppl DEVI, Use to check blood sugar once daily   Cholecalciferol  125 MCG (5000 UT) capsule, Take 10,000 Units by mouth daily.    CINNAMON PO, Take 1,000 mg by mouth daily.   famotidine (PEPCID) 20 MG tablet, Take 1 tablet (20 mg total) by mouth 2 (two) times daily as needed for heartburn or indigestion.   glucose blood (FREESTYLE LITE) test strip, Test blood sugar once daily   Lancets MISC, Test blood sugar once daily.   Magnesium 250 MG TABS, Take 250 mg by mouth at bedtime.    Multiple Vitamin (MULTIVITAMIN WITH MINERALS) TABS, Take 1 tablet by mouth daily.   phentermine (ADIPEX-P) 37.5 MG tablet, Take 1 tablet Daily for Dieting & Weight Loss   topiramate (TOPAMAX) 25 MG tablet, Take  1 to 2 tablets  at Night  for Dieting & Weight Loss   vitamin C (ASCORBIC ACID) 500 MG tablet, Take 1,000 mg by mouth daily.   zinc gluconate 50 MG tablet, Take 50 mg by mouth daily.  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]     Pt states he is not allergic to this.Marland Kitchenpositive tb test 2014, pt took tb treatment 2014    Current Problems (verified) has Hypertension; Type 2 diabetes mellitus ([place]); Vitamin D deficiency; Hyperlipidemia associated with type 2 diabetes mellitus ([place]); Medication management; Morbid obesity ([place]); FH: hypertension; Former heavy tobacco smoker (41 pack year, quit 2020); Fusion of lumbosacral spine; CKD stage 1 due to type 2 diabetes mellitus ([place]); LFT elevation; Gastroesophageal reflux disease; Microalbuminuric diabetic nephropathy ([place]); Fatty liver; Hepatomegaly; Lesion of spleen; Leg DVT (deep venous thromboembolism), chronic, left ([place]); and Hypercalcemia on their problem list.  Screening Tests Immunization History  Administered Date(s) Administered   Influenza Inj Mdck Quad With Preservative 10/10/2017   Influenza Split 12/01/2013   Influenza, Quadrivalent, Recombinant, Inj, Pf 08/14/2019   Influenza,inj,quad, With Preservative 08/23/2019   Influenza-Unspecified 10/02/2018, 12/11/2020    Pneumococcal Polysaccharide-23 10/10/2017   Td 12/17/2003   Tdap 02/14/2015    Preventative care: Last colonoscopy: 12/2018, Dr. Oletta Lamas, 2 benign polyps, 10 year follow up  Former smoker: has been declining CT, today agreeable to set up if low copay/insurance covers  Prior vaccinations: TD or Tdap: 2016  Influenza: 11/2020  Pneumococcal: 2018 Prevnar13: defer - plan on prevnar 20 once available  Shingrix: discuss with insurance  Covid 19: declines   Names of Other Physician/Practitioners you currently use: 1. [facility] here for primary care 2. Dr. ?, eye doctor, last visit ? 2016, overdue diabetic eye - advised to schedule, phone  3. Dr. Joelyn Oms, [facility], dentist, last visit 2022, goes q33m, got implants    Patient Care Team: Unk Pinto, MD as PCP - General (Internal Medicine)  Surgical: He  has a past surgical history that  includes Hernia repair; Cystoscopy/retrograde/ureteroscopy/stone extraction with basket (5/11); Wisdom tooth extraction (yrs ago); Knee surgery (Right, 11/25/11 and 2015); Cholecystectomy (07/27/2012); Total knee arthroplasty (Right, 06/06/2015); Colonoscopy; Anterior lumbar fusion (N/A, 10/07/2019); and Abdominal exposure (N/A, 10/07/2019). Family His family history includes Diabetes in his brother, daughter, father, mother, and sister; Hypertension in his mother; Kidney failure in his mother. Social history  He reports that he quit smoking about 21 months ago. His smoking use included cigarettes. He started smoking about 43 years ago. He has a 41.00 pack-year smoking history. He has never used smokeless tobacco. He reports current alcohol use. He reports that he does not use drugs.  Review of Systems  Constitutional:  Negative for malaise/fatigue and weight loss.  HENT:  Negative for hearing loss and tinnitus.   Eyes:  Negative for blurred vision and double vision.  Respiratory:  Negative for cough, sputum  production, shortness of breath and wheezing.   Cardiovascular:  Negative for chest pain, palpitations, orthopnea, claudication, leg swelling and PND.  Gastrointestinal:  Negative for abdominal pain, blood in stool, constipation, diarrhea, heartburn, melena, nausea and vomiting.  Genitourinary: Negative.   Musculoskeletal:  Positive for back pain (chronic, lumbar). Negative for falls, joint pain and myalgias.  Skin:  Negative for rash.  Neurological:  Positive for tingling (bil feet, mild, unchanged). Negative for dizziness, sensory change, weakness and headaches.  Endo/Heme/Allergies:  Negative for polydipsia.  Psychiatric/Behavioral: Negative.  Negative for depression, memory loss, substance abuse and suicidal ideas. The patient is not nervous/anxious and does not have insomnia.   All other systems reviewed and are negative.    Objective:   Today's Vitals   07/04/21 0848  BP: 122/72  Pulse: 82  Temp: (!) 97.5 F (36.4 C)  SpO2: 99%  Weight: 255 lb 3.2 oz (115.8 kg)  Height: 5' 9.25" (1.759 m)    Body mass index is 37.41 kg/m.  General appearance: alert, no distress, WD/WN, morbidly obese male HEENT: normocephalic, sclerae anicteric, TMs pearly, nares patent, no discharge or erythema, pharynx normal Oral cavity: MMM, no lesions Neck: supple, no lymphadenopathy, no thyromegaly, no masses Heart: RRR, normal S1, S2, no murmurs Lungs: CTA bilaterally, no wheezes, rhonchi, or rales Abdomen: +bs, soft obses, non tender, non distended, no masses, no hepatomegaly, no splenomegaly Musculoskeletal: nontender, no swelling, no obvious deformity Extremities: no edema, no cyanosis, no clubbing Pulses: 2+ symmetric, upper and lower extremities, normal cap refill Neurological: alert, oriented x 3, CN2-12 intact, strength normal upper extremities and lower extremities, sensation normal with monofilament throughout, DTRs 2+ throughout, no cerebellar signs, gait normal Psychiatric: normal  affect, behavior normal, pleasant  GU: declines, no concerns  EKG: NSR, IRBBB  Medicare Attestation I have personally reviewed: The patient's medical and social history Their use of alcohol, tobacco or illicit drugs Their current medications and supplements The patient's functional ability including ADLs,fall risks, home safety risks, cognitive, and hearing and visual impairment Diet and physical activities Evidence for depression or mood disorders  The patient's weight, height, BMI, and visual acuity have been recorded in the chart.  I have made referrals, counseling, and provided education to the patient based on review of the above and I have provided the patient with a written personalized care plan for preventive services.     Izora Ribas, NP   07/04/2021

## 2021-07-04 ENCOUNTER — Other Ambulatory Visit: Payer: Self-pay

## 2021-07-04 ENCOUNTER — Encounter: Payer: Self-pay | Admitting: Adult Health

## 2021-07-04 ENCOUNTER — Ambulatory Visit (INDEPENDENT_AMBULATORY_CARE_PROVIDER_SITE_OTHER): Payer: PPO | Admitting: Adult Health

## 2021-07-04 VITALS — BP 122/72 | HR 82 | Temp 97.5°F | Ht 69.25 in | Wt 255.2 lb

## 2021-07-04 DIAGNOSIS — Z8249 Family history of ischemic heart disease and other diseases of the circulatory system: Secondary | ICD-10-CM | POA: Diagnosis not present

## 2021-07-04 DIAGNOSIS — E1169 Type 2 diabetes mellitus with other specified complication: Secondary | ICD-10-CM | POA: Diagnosis not present

## 2021-07-04 DIAGNOSIS — Z0000 Encounter for general adult medical examination without abnormal findings: Secondary | ICD-10-CM | POA: Diagnosis not present

## 2021-07-04 DIAGNOSIS — Z136 Encounter for screening for cardiovascular disorders: Secondary | ICD-10-CM

## 2021-07-04 DIAGNOSIS — R160 Hepatomegaly, not elsewhere classified: Secondary | ICD-10-CM | POA: Diagnosis not present

## 2021-07-04 DIAGNOSIS — E1121 Type 2 diabetes mellitus with diabetic nephropathy: Secondary | ICD-10-CM | POA: Diagnosis not present

## 2021-07-04 DIAGNOSIS — E559 Vitamin D deficiency, unspecified: Secondary | ICD-10-CM | POA: Diagnosis not present

## 2021-07-04 DIAGNOSIS — Z125 Encounter for screening for malignant neoplasm of prostate: Secondary | ICD-10-CM

## 2021-07-04 DIAGNOSIS — E1122 Type 2 diabetes mellitus with diabetic chronic kidney disease: Secondary | ICD-10-CM | POA: Diagnosis not present

## 2021-07-04 DIAGNOSIS — I10 Essential (primary) hypertension: Secondary | ICD-10-CM

## 2021-07-04 DIAGNOSIS — Z87891 Personal history of nicotine dependence: Secondary | ICD-10-CM | POA: Diagnosis not present

## 2021-07-04 DIAGNOSIS — K760 Fatty (change of) liver, not elsewhere classified: Secondary | ICD-10-CM

## 2021-07-04 DIAGNOSIS — E785 Hyperlipidemia, unspecified: Secondary | ICD-10-CM | POA: Diagnosis not present

## 2021-07-04 DIAGNOSIS — K219 Gastro-esophageal reflux disease without esophagitis: Secondary | ICD-10-CM

## 2021-07-04 DIAGNOSIS — Z0001 Encounter for general adult medical examination with abnormal findings: Secondary | ICD-10-CM

## 2021-07-04 DIAGNOSIS — N181 Chronic kidney disease, stage 1: Secondary | ICD-10-CM

## 2021-07-04 DIAGNOSIS — I82502 Chronic embolism and thrombosis of unspecified deep veins of left lower extremity: Secondary | ICD-10-CM

## 2021-07-04 NOTE — Patient Instructions (Addendum)
Mr. Nickson , Thank you for taking time to come for your Annual Wellness Visit. I appreciate your ongoing commitment to your health goals. Please review the following plan we discussed and let me know if I can assist you in the future.   These are the goals we discussed:  Goals      Blood Pressure < 130/80     HEMOGLOBIN A1C < 7.0     LDL CALC < 70     Weight (lb) < 240 lb (108.9 kg)        This is a list of the screening recommended for you and due dates:  Health Maintenance  Topic Date Due   Eye exam for diabetics  Never done   COVID-19 Vaccine (1) 07/20/2021*   Pneumonia vaccines (1 of 2 - PCV13) 08/31/2021*   Zoster (Shingles) Vaccine (1 of 2) 07/04/2022*   Flu Shot  07/16/2021   Hemoglobin A1C  10/02/2021   Complete foot exam   07/04/2022   Tetanus Vaccine  02/13/2025   Colon Cancer Screening  12/18/2028   Hepatitis C Screening: USPSTF Recommendation to screen - Ages 18-79 yo.  Completed   HPV Vaccine  Aged Out  *Topic was postponed. The date shown is not the original due date.    Try to eat more "nutrient dense" foods - beans, greens, bright colored plants  Ask insurance about shingrix vaccine if they cover - can get at pharmacy   Eye doctors that you can call, they are all very close to our office   Dr. Herbert Deaner [phone]   Zoster Vaccine, Recombinant injection What is this medication? ZOSTER VACCINE (ZOS ter vak SEEN) is a vaccine used to reduce the risk of getting shingles. This vaccine is not used to treat shingles or nerve pain fromshingles. This medicine may be used for other purposes; ask your health care provider orpharmacist if you have questions. COMMON BRAND NAME(S): [facility] What should I tell my care team before I take this medication? They need to know if you have any of these conditions: cancer immune system problems an unusual or allergic reaction to Zoster vaccine, other medications, foods, dyes, or preservatives pregnant or trying to get  pregnant breast-feeding How should I use this medication? This vaccine is injected into a muscle. It is given by a health care provider. A copy of Vaccine Information Statements will be given before each vaccination. Be sure to read this information carefully each time. This sheet may changeoften. Talk to your health care provider about the use of this vaccine in children.This vaccine is not approved for use in children. Overdosage: If you think you have taken too much of this medicine contact apoison control center or emergency room at once. NOTE: This medicine is only for you. Do not share this medicine with others. What if I miss a dose? Keep appointments for follow-up (booster) doses. It is important not to miss your dose. Call your health care provider if you are unable to keep anappointment. What may interact with this medication? medicines that suppress your immune system medicines to treat cancer steroid medicines like prednisone or cortisone This list may not describe all possible interactions. Give your health care provider a list of all the medicines, herbs, non-prescription drugs, or dietary supplements you use. Also tell them if you smoke, drink alcohol, or use illegaldrugs. Some items may interact with your medicine. What should I watch for while using this medication? Visit your health care provider regularly. This vaccine,  like all vaccines, may not fully protect everyone. What side effects may I notice from receiving this medication? Side effects that you should report to your doctor or health care professionalas soon as possible: allergic reactions (skin rash, itching or hives; swelling of the face, lips, or tongue) trouble breathing Side effects that usually do not require medical attention (report these toyour doctor or health care professional if they continue or are bothersome): chills headache fever nausea pain, redness, or irritation at site where  injected tiredness vomiting This list may not describe all possible side effects. Call your doctor for medical advice about side effects. You may report side effects to FDA at1-800-FDA-1088. Where should I keep my medication? This vaccine is only given by a health care provider. It will not be stored athome. NOTE: This sheet is a summary. It may not cover all possible information. If you have questions about this medicine, talk to your doctor, pharmacist, orhealth care provider.  2022 Elsevier/Gold Standard (2020-01-07 16:23:07)

## 2021-07-05 ENCOUNTER — Other Ambulatory Visit: Payer: Self-pay | Admitting: Adult Health

## 2021-07-05 DIAGNOSIS — N289 Disorder of kidney and ureter, unspecified: Secondary | ICD-10-CM

## 2021-07-05 LAB — URINALYSIS, ROUTINE W REFLEX MICROSCOPIC
Bacteria, UA: NONE SEEN /HPF
Bilirubin Urine: NEGATIVE
Hgb urine dipstick: NEGATIVE
Ketones, ur: NEGATIVE
Nitrite: NEGATIVE
RBC / HPF: NONE SEEN /HPF (ref 0–2)
Specific Gravity, Urine: 1.02 (ref 1.001–1.035)
Squamous Epithelial / HPF: NONE SEEN /HPF (ref <=5)
pH: 5.5 (ref 5.0–8.0)

## 2021-07-05 LAB — CBC WITH DIFFERENTIAL/PLATELET
Absolute Monocytes: 757 cells/uL (ref 200–950)
Basophils Absolute: 60 cells/uL (ref 0–200)
Basophils Relative: 0.7 %
Eosinophils Absolute: 138 cells/uL (ref 15–500)
Eosinophils Relative: 1.6 %
HCT: 43.5 % (ref 38.5–50.0)
Hemoglobin: 14.6 g/dL (ref 13.2–17.1)
Lymphs Abs: 2176 cells/uL (ref 850–3900)
MCH: 30.6 pg (ref 27.0–33.0)
MCHC: 33.6 g/dL (ref 32.0–36.0)
MCV: 91.2 fL (ref 80.0–100.0)
MPV: 11.2 fL (ref 7.5–12.5)
Monocytes Relative: 8.8 %
Neutro Abs: 5470 cells/uL (ref 1500–7800)
Neutrophils Relative %: 63.6 %
Platelets: 241 10*3/uL (ref 140–400)
RBC: 4.77 10*6/uL (ref 4.20–5.80)
RDW: 12.7 % (ref 11.0–15.0)
Total Lymphocyte: 25.3 %
WBC: 8.6 10*3/uL (ref 3.8–10.8)

## 2021-07-05 LAB — COMPLETE METABOLIC PANEL WITH GFR
AG Ratio: 1.6 (calc) (ref 1.0–2.5)
ALT: 35 U/L (ref 9–46)
AST: 22 U/L (ref 10–35)
Albumin: 4.6 g/dL (ref 3.6–5.1)
Alkaline phosphatase (APISO): 45 U/L (ref 35–144)
BUN/Creatinine Ratio: 23 (calc) — ABNORMAL HIGH (ref 6–22)
BUN: 39 mg/dL — ABNORMAL HIGH (ref 7–25)
CO2: 30 mmol/L (ref 20–32)
Calcium: 10.8 mg/dL — ABNORMAL HIGH (ref 8.6–10.3)
Chloride: 104 mmol/L (ref 98–110)
Creat: 1.7 mg/dL — ABNORMAL HIGH (ref 0.70–1.35)
Globulin: 2.9 g/dL (calc) (ref 1.9–3.7)
Glucose, Bld: 168 mg/dL — ABNORMAL HIGH (ref 65–99)
Potassium: 5.3 mmol/L (ref 3.5–5.3)
Sodium: 141 mmol/L (ref 135–146)
Total Bilirubin: 0.5 mg/dL (ref 0.2–1.2)
Total Protein: 7.5 g/dL (ref 6.1–8.1)
eGFR: 44 mL/min/{1.73_m2} — ABNORMAL LOW (ref >=60)

## 2021-07-05 LAB — VITAMIN D 25 HYDROXY (VIT D DEFICIENCY, FRACTURES): Vit D, 25-Hydroxy: 88 ng/mL (ref 30–100)

## 2021-07-05 LAB — LIPID PANEL
Cholesterol: 137 mg/dL (ref <200)
HDL: 34 mg/dL — ABNORMAL LOW (ref >=40)
LDL Cholesterol (Calc): 58 mg/dL (calc)
Non-HDL Cholesterol (Calc): 103 mg/dL (calc) (ref <130)
Total CHOL/HDL Ratio: 4 (calc) (ref <5.0)
Triglycerides: 376 mg/dL — ABNORMAL HIGH (ref <150)

## 2021-07-05 LAB — MICROALBUMIN / CREATININE URINE RATIO
Creatinine, Urine: 124 mg/dL (ref 20–320)
Microalb Creat Ratio: 31 mcg/mg creat — ABNORMAL HIGH (ref <30)
Microalb, Ur: 3.8 mg/dL

## 2021-07-05 LAB — PSA: PSA: 0.55 ng/mL (ref <=4.00)

## 2021-07-05 LAB — MAGNESIUM: Magnesium: 1.5 mg/dL (ref 1.5–2.5)

## 2021-07-05 LAB — HEMOGLOBIN A1C
Hgb A1c MFr Bld: 7.5 % of total Hgb — ABNORMAL HIGH (ref <5.7)
Mean Plasma Glucose: 169 mg/dL
eAG (mmol/L): 9.3 mmol/L

## 2021-07-05 LAB — TSH: TSH: 4.58 mIU/L — ABNORMAL HIGH (ref 0.40–4.50)

## 2021-07-05 LAB — MICROSCOPIC MESSAGE

## 2021-07-05 MED ORDER — MAGNESIUM 250 MG PO TABS: 250.0000 mg | ORAL_TABLET | Freq: Two times a day (BID) | ORAL | Status: AC

## 2021-07-23 ENCOUNTER — Ambulatory Visit
Admission: RE | Admit: 2021-07-23 | Discharge: 2021-07-23 | Disposition: A | Discharge status: Home / Self Care | Payer: PPO | Source: Ambulatory Visit | Attending: Adult Health | Admitting: Adult Health

## 2021-07-23 DIAGNOSIS — Z87891 Personal history of nicotine dependence: Secondary | ICD-10-CM | POA: Diagnosis not present

## 2021-07-23 DIAGNOSIS — J432 Centrilobular emphysema: Secondary | ICD-10-CM | POA: Diagnosis not present

## 2021-07-23 DIAGNOSIS — M47816 Spondylosis without myelopathy or radiculopathy, lumbar region: Secondary | ICD-10-CM | POA: Diagnosis not present

## 2021-07-23 DIAGNOSIS — I2510 Atherosclerotic heart disease of native coronary artery without angina pectoris: Secondary | ICD-10-CM | POA: Diagnosis not present

## 2021-07-24 ENCOUNTER — Other Ambulatory Visit: Payer: Self-pay | Admitting: Adult Health

## 2021-07-24 DIAGNOSIS — Z87891 Personal history of nicotine dependence: Secondary | ICD-10-CM

## 2021-07-24 DIAGNOSIS — R918 Other nonspecific abnormal finding of lung field: Secondary | ICD-10-CM

## 2021-08-01 ENCOUNTER — Other Ambulatory Visit: Payer: Self-pay | Admitting: Adult Health

## 2021-08-01 ENCOUNTER — Other Ambulatory Visit: Payer: Self-pay | Admitting: *Deleted

## 2021-08-01 DIAGNOSIS — E785 Hyperlipidemia, unspecified: Secondary | ICD-10-CM

## 2021-08-01 DIAGNOSIS — E1169 Type 2 diabetes mellitus with other specified complication: Secondary | ICD-10-CM

## 2021-08-01 MED ORDER — FENOFIBRATE 145 MG PO TABS: 145.0000 mg | ORAL_TABLET | Freq: Every day | ORAL | 1 refills | Status: DC

## 2021-08-09 ENCOUNTER — Telehealth: Payer: Self-pay

## 2021-08-09 NOTE — Telephone Encounter (Signed)
States that he has passed two kidney stones a few days ago but is requesting a referral to a kidney specialist. He thinks he has more. Please advise.

## 2021-08-10 ENCOUNTER — Other Ambulatory Visit: Payer: Self-pay | Admitting: Adult Health

## 2021-08-10 DIAGNOSIS — Z87442 Personal history of urinary calculi: Secondary | ICD-10-CM | POA: Insufficient documentation

## 2021-08-10 DIAGNOSIS — R109 Unspecified abdominal pain: Secondary | ICD-10-CM

## 2021-08-10 MED ORDER — TAMSULOSIN HCL 0.4 MG PO CAPS: ORAL_CAPSULE | ORAL | 0 refills | Status: DC

## 2021-08-10 NOTE — Telephone Encounter (Signed)
Patient two stones on Tuesday. Has has a history of stones back in 2011 and was treated by Dr. Jeffie Pollock. Having left flank pain, not sharp right now, just throbbing. Please order the CT and meds.

## 2021-08-13 ENCOUNTER — Other Ambulatory Visit: Payer: Self-pay | Admitting: Adult Health

## 2021-08-13 ENCOUNTER — Ambulatory Visit (HOSPITAL_BASED_OUTPATIENT_CLINIC_OR_DEPARTMENT_OTHER)
Admission: RE | Admit: 2021-08-13 | Discharge: 2021-08-13 | Disposition: A | Discharge status: Home / Self Care | Payer: PPO | Source: Ambulatory Visit | Attending: Adult Health | Admitting: Adult Health

## 2021-08-13 ENCOUNTER — Other Ambulatory Visit: Payer: Self-pay

## 2021-08-13 DIAGNOSIS — K5730 Diverticulosis of large intestine without perforation or abscess without bleeding: Secondary | ICD-10-CM | POA: Diagnosis not present

## 2021-08-13 DIAGNOSIS — Z87442 Personal history of urinary calculi: Secondary | ICD-10-CM | POA: Diagnosis not present

## 2021-08-13 DIAGNOSIS — R109 Unspecified abdominal pain: Secondary | ICD-10-CM | POA: Insufficient documentation

## 2021-08-13 DIAGNOSIS — N200 Calculus of kidney: Secondary | ICD-10-CM | POA: Diagnosis not present

## 2021-08-14 ENCOUNTER — Other Ambulatory Visit: Payer: Self-pay | Admitting: Adult Health

## 2021-08-14 ENCOUNTER — Encounter: Payer: Self-pay | Admitting: Adult Health

## 2021-08-14 DIAGNOSIS — N200 Calculus of kidney: Secondary | ICD-10-CM | POA: Insufficient documentation

## 2021-08-30 ENCOUNTER — Other Ambulatory Visit: Payer: Self-pay

## 2021-08-30 ENCOUNTER — Ambulatory Visit: Payer: PPO

## 2021-08-30 DIAGNOSIS — N289 Disorder of kidney and ureter, unspecified: Secondary | ICD-10-CM | POA: Diagnosis not present

## 2021-08-30 NOTE — Progress Notes (Signed)
Patient came in for labs only today. The patient reports that he is drinking a 32 ounce cup of water five times daily, plus a couple of bottles of water. He reports that he is not using any NSAIDS and has cut back on Tylenol. He reports he has passed two kidney stones to date. He is requesting a refill of Tamulosin 0.4 mg as he only has two capsules remaining.

## 2021-09-03 ENCOUNTER — Other Ambulatory Visit: Payer: Self-pay | Admitting: Adult Health

## 2021-09-06 ENCOUNTER — Ambulatory Visit: Payer: PPO | Admitting: Adult Health

## 2021-09-06 LAB — BASIC METABOLIC PANEL WITH GFR
BUN/Creatinine Ratio: 22 (calc) (ref 6–22)
BUN: 32 mg/dL — ABNORMAL HIGH (ref 7–25)
CO2: 29 mmol/L (ref 20–32)
Calcium: 10.7 mg/dL — ABNORMAL HIGH (ref 8.6–10.3)
Chloride: 105 mmol/L (ref 98–110)
Creat: 1.44 mg/dL — ABNORMAL HIGH (ref 0.70–1.35)
Glucose, Bld: 172 mg/dL — ABNORMAL HIGH (ref 65–99)
Potassium: 4.8 mmol/L (ref 3.5–5.3)
Sodium: 142 mmol/L (ref 135–146)
eGFR: 54 mL/min/{1.73_m2} — ABNORMAL LOW (ref >=60)

## 2021-09-06 LAB — VITAMIN D 1,25 DIHYDROXY
Vitamin D 1, 25 (OH)2 Total: 36 pg/mL (ref 18–72)
Vitamin D2 1, 25 (OH)2: <8 pg/mL
Vitamin D3 1, 25 (OH)2: 36 pg/mL

## 2021-09-06 LAB — PTH-RELATED PEPTIDE: PTH-Related Protein (PTH-RP): 13 pg/mL (ref 11–20)

## 2021-10-03 ENCOUNTER — Other Ambulatory Visit: Payer: Self-pay

## 2021-10-03 DIAGNOSIS — N200 Calculus of kidney: Secondary | ICD-10-CM | POA: Diagnosis not present

## 2021-10-03 DIAGNOSIS — I10 Essential (primary) hypertension: Secondary | ICD-10-CM

## 2021-10-03 DIAGNOSIS — E1121 Type 2 diabetes mellitus with diabetic nephropathy: Secondary | ICD-10-CM

## 2021-10-03 DIAGNOSIS — N181 Chronic kidney disease, stage 1: Secondary | ICD-10-CM

## 2021-10-03 DIAGNOSIS — I700 Atherosclerosis of aorta: Secondary | ICD-10-CM | POA: Insufficient documentation

## 2021-10-03 DIAGNOSIS — R3121 Asymptomatic microscopic hematuria: Secondary | ICD-10-CM | POA: Diagnosis not present

## 2021-10-03 MED ORDER — TAMSULOSIN HCL 0.4 MG PO CAPS: ORAL_CAPSULE | ORAL | 0 refills | Status: DC

## 2021-10-03 MED ORDER — LOSARTAN POTASSIUM-HCTZ 100-25 MG PO TABS: ORAL_TABLET | ORAL | 1 refills | Status: DC

## 2021-10-03 NOTE — Progress Notes (Signed)
AWV AND FOLLOW UP Assessment:   Diagnoses and all orders for this visit:  Annual Medicare Wellness Visit Due annually  Health maintenance reviewed SCHEDULE OVERDUE DIABETES EYE  Essential hypertension Monitor blood pressure at home; call if consistently over 130/80 Continue DASH diet.   Reminder to go to the ER if any CP, SOB, nausea, dizziness, severe HA, changes vision/speech, left arm numbness and tingling and jaw pain.  Leg DVT (deep venous thromboembolism), chronic, left (HCC) Chronic; vascular evaluated and advised stop elequis; monitor  Type 2 diabetes mellitus with stage 1 chronic kidney disease, without long-term current use of insulin [facility]) Education: Reviewed 'ABCs' of diabetes management (respective goals in parentheses):  A1C (<7), blood pressure (<130/80), and cholesterol (LDL <70) Eye Exam yearly and Dental Exam every 6 months.- overdue eye exam, reminded to schedule, numbers given Dietary recommendations Physical Activity recommendations Discussed possible benefit from ozempic/trulicity instead of glipizide, would get financial assistance, proceed with trulicity application paperwork Hemoglobin A1c q55m  Hyperlipidemia associated with type 2 diabetes mellitus ([place]) Titrate for LDL goal <70 Continue low cholesterol diet and exercise.  Check lipid panel.   CKD stage 1 due to type 2 diabetes mellitus (HCC) Increase fluids, avoid NSAIDS, monitor sugars, will monitor  Microalbuminuric diabetic nephropathy (HCC) Increase fluids, avoid NSAIDS, monitor sugars, will monitor; on ACE/ARB   Gastroesophageal reflux disease, unspecified whether esophagitis present Well managed on current medications Discussed diet, avoiding triggers and other lifestyle changes  Fatty liver Weight loss advised, avoid alcohol/tylenol, low carb diet, will monitor LFTs Dr. Paulita Fujita following  Hepatomegaly Dr. Paulita Fujita following, avoid tylenol, alcohol, weight loss encouraged  Former heavy  tobacco smoker (41 pack year, quit 2020) Continue annual screening; see nodule for follow up CT plans  Lung nodule Follow up CT due 10/23/2021, order placed. Some cost concerns, check with insurance prior to scheduling   Vitamin D deficiency At goal at recent check; continue to recommend supplementation for goal of 60-100 Check vitamin D annually and PRN  Morbid obesity ([place]) Long discussion about weight loss, diet, and exercise Discussed final goal weight  Patient on phentermine/topamax with limited recent progress - will add [id number] - trulicity due to cost concerns/assistance program - Aaron Burns prefers to continue topamax, d/c phentermine once on trulicity  continue close follow up. Return in 3 months  Medication management Due at each visit   Need for influenza vaccine High dose quadrivalent administered without complication today    Orders Placed This Encounter  Procedures   CT CHEST LCS NODULE F/U W/O CONTRAST   Flu vaccine HIGH DOSE PF   CBC with Differential/Platelet   COMPLETE METABOLIC PANEL WITH GFR   Magnesium   Lipid panel   TSH   Hemoglobin A1c      Over 30 minutes of exam, counseling, chart review, and critical decision making was performed  Future Appointments  Date Time Provider [place]  01/09/2022  9:30 AM [name], NP [facility] None  07/04/2022  9:00 AM [name], NP [facility] None  10/01/2022 11:00 AM [name], NP [facility] None     Plan:   During the course of the visit the patient was educated and counseled about appropriate screening and preventive services including:   Pneumococcal vaccine  Influenza vaccine Prevnar 13 Td vaccine Screening electrocardiogram Colorectal cancer screening Diabetes screening Glaucoma screening Nutrition counseling    Subjective:  Aaron Burns is a 65 y.o. male who presents for CPE and 3 month follow up for HTN, hyperlipidemia, T2DM with diabetic nephropathy,  and vitamin D Def.    Aaron Burns is married, 2 kids, no grandchildren. 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   Aaron Burns has lumbar fusion in 09/2019 by Dr. Rolena Infante, does have some residual pain limited to lower back without radicular sx. Takes 2 tab regular tylenol but limited benefit. Was recommended weight loss. Had DVT complication after surgery - Notably saw Dr. Sherren Mocha Early in Juy 2021 for left chronic DVT persistent despite xarelto x 6+ months, was advised to taper off, no further follow up unless new sx.   Hx of elevated LFTs; [place] 04/2020 showed hepatomegaly and diffuse hepatic steatosis, several small hyperecchoic masses within spleen, possible hemangiomas - Aaron Burns was referred to GI Dr. 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 Note recent CT 08/12/2021 shows progression of fatty liver and interval enlargement of liver.   Aaron Burns is former smoker, quit in 2020, 40 pack year history. Aaron Burns had screening CT scan 07/23/2021 that showed concerning 9.2 mm nodule of RML recommended for 3 month follow up  BMI is Body mass index is 37.83 kg/m., Aaron Burns has been working on diet and exercise. Prescribed phentermine/topamax with benefit when takes, no SE., takes regular breaks. Aaron Burns has jointed silver sneakers but doesn't go too frequently, trying to increase, trying to get wife to join him. Aaron Burns reports Aaron Burns did cut down on beer, minimal now.  Wt Readings from Last 3 Encounters:  10/04/21 258 lb (117 kg)  08/30/21 259 lb (117.5 kg)  07/04/21 255 lb 3.2 oz (115.8 kg)   His blood pressure has been controlled at home, today their BP is BP: 126/70 Aaron Burns does workout. Aaron Burns denies chest pain, shortness of breath, dizziness.   Aaron Burns has aortic atherosclerosis and coronary calcifications per CT 07/23/2021.    Aaron Burns is on cholesterol medication (rosuvastatin 5 mg and and fenofibrate 145 mg daily) denies myalgias. His cholesterol is not at goal. Reports rare alcohol. The cholesterol last  visit was:   Lab Results  Component Value Date   CHOL 137 07/04/2021   HDL 34 (L) 07/04/2021   LDLCALC 58 07/04/2021   TRIG 376 (H) 07/04/2021   CHOLHDL 4.0 07/04/2021   Aaron Burns has been working on diet and exercise for T2 diabetes, on metformin 2000 mg daily, glipizide 5 mg BID, and denies hyperglycemia, hypoglycemia , increased appetite, nausea, polydipsia, polyuria and visual disturbances. Aaron Burns reports fasting running around 130-145 Aaron Burns does have mild tingling in bil toes. Last A1C in the office was:  Lab Results  Component Value Date   HGBA1C 7.5 (H) 07/04/2021   Aaron Burns has CKD II/III associated with htn and T2DM monitored at this office. Aaron Burns is on ARB. Last GFR Lab Results  Component Value Date   GFRNONAA 59 (L) 05/07/2021   GFRNONAA 49 (L) 04/02/2021   GFRNONAA 72 12/04/2020   Patient is on Vitamin D supplement.   Lab Results  Component Value Date   VD25OH 88 07/04/2021       Medication Review:  Current Outpatient Medications (Endocrine & Metabolic):    Dulaglutide (TRULICITY) 1.74 BS/4.9QP SOPN, Inject 0.75 mg into the skin once a week. Contact office for next dose up once completed.   metFORMIN (GLUCOPHAGE-XR) 500 MG 24 hr tablet, Take   2 tablets   2 x /day    with Meals    for Diabetes  Current Outpatient Medications (Cardiovascular):    fenofibrate (TRICOR) 145 MG tablet, Take 1 tablet (145 mg total) by mouth daily.  losartan-hydrochlorothiazide (HYZAAR) 100-25 MG tablet, TAKE ONE TABLET BY MOUTH DAILY FOR BLOOD PRESSURE   rosuvastatin (CRESTOR) 5 MG tablet, TAKE ONE TABLET BY MOUTH ONCE WEEKLY IN THE EVENINGS ON SUNDAYS   Current Outpatient Medications (Analgesics):    acetaminophen (TYLENOL) 325 MG tablet, Take 2 tablets (650 mg total) by mouth every 4 (four) hours as needed for mild pain ((score 1 to 3) or temp > 100.5).   aspirin EC 81 MG tablet, Take 81 mg by mouth daily. Swallow whole.   Current Outpatient Medications (Other):    Blood Glucose Monitoring Suppl  DEVI, Use to check blood sugar once daily   Cholecalciferol 125 MCG (5000 UT) capsule, Take 10,000 Units by mouth daily.    CINNAMON PO, Take 1,000 mg by mouth daily.   glucose blood (FREESTYLE LITE) test strip, Test blood sugar once daily   Lancets MISC, Test blood sugar once daily.   Magnesium 250 MG TABS, Take 1 tablet (250 mg total) by mouth 2 (two) times daily with a meal.   Multiple Vitamin (MULTIVITAMIN WITH MINERALS) TABS, Take 1 tablet by mouth daily.   tamsulosin (FLOMAX) 0.4 MG CAPS capsule, TAKE ONE CAPSULE BY MOUTH DAILY AS NEEDED FOR KIDNEY STONE   topiramate (TOPAMAX) 25 MG tablet, Take  1 to 2 tablets  at Night  for Dieting & Weight Loss   vitamin C (ASCORBIC ACID) 500 MG tablet, Take 1,000 mg by mouth daily.   zinc gluconate 50 MG tablet, Take 50 mg by mouth daily.   famotidine (PEPCID) 20 MG tablet, Take 1 tablet (20 mg total) by mouth 2 (two) times daily as needed for heartburn or indigestion.  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]     Pt states Aaron Burns is not allergic to this.Marland Kitchenpositive tb test 2014, pt took tb treatment 2014    Current Problems (verified) has Hypertension; Type 2 diabetes mellitus ([place]); Vitamin D deficiency; Hyperlipidemia associated with type 2 diabetes mellitus ([place]); Medication management; Morbid obesity ([place]); FH: hypertension; Former heavy tobacco smoker (41 pack year, quit 2020); Fusion of lumbosacral spine; CKD stage 1 due to type 2 diabetes mellitus ([place]); LFT elevation; Gastroesophageal reflux disease; Microalbuminuric diabetic nephropathy ([place]); Fatty liver; Hepatomegaly; Lesion of spleen; Leg DVT (deep venous thromboembolism), chronic, left ([place]); Hypercalcemia; Lung nodules; History of renal calculi; Kidney stones; and Aortic atherosclerosis ([place]) -CT 07/2021 on their problem list.  Screening Tests Immunization History  Administered Date(s) Administered   Influenza Inj Mdck Quad With Preservative  10/10/2017   Influenza Split 12/01/2013   Influenza, High Dose Seasonal PF 10/04/2021   Influenza, Quadrivalent, Recombinant, Inj, Pf 08/14/2019   Influenza,inj,quad, With Preservative 08/23/2019   Influenza-Unspecified 10/02/2018, 12/11/2020   Pneumococcal Polysaccharide-23 10/10/2017   Td 12/17/2003   Tdap 02/14/2015    Preventative care: Last colonoscopy: 12/2018, Dr. Oletta Lamas, 2 benign polyps, 10 year follow up  Former smoker: 07/2021, nodule, schedule 3 month follow up  Prior vaccinations: TD or Tdap: 2016  Influenza: 11/2020, TODAY  Pneumococcal: 2018 Prevnar13: defer - plan on prevnar 20 next year Shingrix: discuss with insurance  Covid 19: declines   Names of Other Physician/Practitioners you currently use: 1. [facility] here for primary care 2. Dr. ?, eye doctor, last visit ? 2016, overdue diabetic eye - advised to schedule 3. Dr. Joelyn Oms, [facility], dentist, last visit 2022, goes q13m, got implants    Patient Care Team: Unk Pinto, MD as PCP - General (Internal Medicine)  Surgical: Aaron Burns  has a past surgical history that includes Hernia repair; Cystoscopy/retrograde/ureteroscopy/stone extraction with basket (5/11); Wisdom tooth extraction (yrs ago); Knee surgery (Right, 11/25/11 and 2015); Cholecystectomy (07/27/2012); Total knee arthroplasty (Right, 06/06/2015); Colonoscopy; Anterior lumbar fusion (N/A, 10/07/2019); and Abdominal exposure (N/A, 10/07/2019). Family His family history includes Diabetes in his brother, daughter, father, mother, and sister; Hypertension in his mother; Kidney failure in his mother. Social history  Aaron Burns reports that Aaron Burns quit smoking about 2 years ago. His smoking use included cigarettes. Aaron Burns started smoking about 43 years ago. Aaron Burns has a 41.00 pack-year smoking history. Aaron Burns has never used smokeless tobacco. Aaron Burns reports current alcohol use. Aaron Burns reports that Aaron Burns does not use drugs.  Review of Systems   Constitutional:  Negative for malaise/fatigue and weight loss.  HENT:  Negative for hearing loss and tinnitus.   Eyes:  Negative for blurred vision and double vision.  Respiratory:  Negative for cough, sputum production, shortness of breath and wheezing.   Cardiovascular:  Negative for chest pain, palpitations, orthopnea, claudication, leg swelling and PND.  Gastrointestinal:  Negative for abdominal pain, blood in stool, constipation, diarrhea, heartburn, melena, nausea and vomiting.  Genitourinary: Negative.   Musculoskeletal:  Positive for back pain (chronic, lumbar). Negative for falls, joint pain and myalgias.  Skin:  Negative for rash.  Neurological:  Positive for tingling (bil feet, mild, unchanged). Negative for dizziness, sensory change, weakness and headaches.  Endo/Heme/Allergies:  Negative for polydipsia.  Psychiatric/Behavioral: Negative.  Negative for depression, memory loss, substance abuse and suicidal ideas. The patient is not nervous/anxious and does not have insomnia.   All other systems reviewed and are negative.    Objective:   Today's Vitals   10/04/21 0926  BP: 126/70  Pulse: 68  Temp: (!) 97.5 F (36.4 C)  SpO2: 90%  Weight: 258 lb (117 kg)    Body mass index is 37.83 kg/m.  General appearance: alert, no distress, WD/WN, morbidly obese male HEENT: normocephalic, sclerae anicteric, TMs pearly, nares patent, no discharge or erythema, pharynx normal Oral cavity: MMM, no lesions Neck: supple, no lymphadenopathy, no thyromegaly, no masses Heart: RRR, normal S1, S2, no murmurs Lungs: CTA bilaterally, no wheezes, rhonchi, or rales Abdomen: +bs, soft obses, non tender, non distended, no masses, no hepatomegaly, no splenomegaly Musculoskeletal: nontender, no swelling, no obvious deformity Extremities: no edema, no cyanosis, no clubbing Pulses: 2+ symmetric, upper and lower extremities, normal cap refill Neurological: alert, oriented x 3, CN2-12 intact,  strength normal upper extremities and lower extremities, sensation normal with monofilament throughout, DTRs 2+ throughout, no cerebellar signs, gait normal Psychiatric: normal affect, behavior normal, pleasant   Medicare Attestation I have personally reviewed: The patient's medical and social history Their use of alcohol, tobacco or illicit drugs Their current medications and supplements The patient's functional ability including ADLs,fall risks, home safety risks, cognitive, and hearing and visual impairment Diet and physical activities Evidence for depression or mood disorders  The patient's weight, height, BMI, and visual acuity have been recorded in the chart.  I have made referrals, counseling, and provided education to the patient based on review of the above and I have provided the patient with a written personalized care plan for preventive services.     Izora Ribas, NP   10/04/2021

## 2021-10-04 ENCOUNTER — Other Ambulatory Visit: Payer: Self-pay

## 2021-10-04 ENCOUNTER — Encounter: Payer: Self-pay | Admitting: Adult Health

## 2021-10-04 ENCOUNTER — Ambulatory Visit (INDEPENDENT_AMBULATORY_CARE_PROVIDER_SITE_OTHER): Payer: PPO | Admitting: Adult Health

## 2021-10-04 VITALS — BP 126/70 | HR 68 | Temp 97.5°F | Wt 258.0 lb

## 2021-10-04 DIAGNOSIS — Z0000 Encounter for general adult medical examination without abnormal findings: Secondary | ICD-10-CM

## 2021-10-04 DIAGNOSIS — R6889 Other general symptoms and signs: Secondary | ICD-10-CM | POA: Diagnosis not present

## 2021-10-04 DIAGNOSIS — K219 Gastro-esophageal reflux disease without esophagitis: Secondary | ICD-10-CM

## 2021-10-04 DIAGNOSIS — Z79899 Other long term (current) drug therapy: Secondary | ICD-10-CM

## 2021-10-04 DIAGNOSIS — I82502 Chronic embolism and thrombosis of unspecified deep veins of left lower extremity: Secondary | ICD-10-CM

## 2021-10-04 DIAGNOSIS — Z0001 Encounter for general adult medical examination with abnormal findings: Secondary | ICD-10-CM

## 2021-10-04 DIAGNOSIS — N181 Chronic kidney disease, stage 1: Secondary | ICD-10-CM

## 2021-10-04 DIAGNOSIS — I10 Essential (primary) hypertension: Secondary | ICD-10-CM

## 2021-10-04 DIAGNOSIS — K760 Fatty (change of) liver, not elsewhere classified: Secondary | ICD-10-CM | POA: Diagnosis not present

## 2021-10-04 DIAGNOSIS — E1169 Type 2 diabetes mellitus with other specified complication: Secondary | ICD-10-CM | POA: Diagnosis not present

## 2021-10-04 DIAGNOSIS — E1122 Type 2 diabetes mellitus with diabetic chronic kidney disease: Secondary | ICD-10-CM | POA: Diagnosis not present

## 2021-10-04 DIAGNOSIS — Z87891 Personal history of nicotine dependence: Secondary | ICD-10-CM

## 2021-10-04 DIAGNOSIS — Z23 Encounter for immunization: Secondary | ICD-10-CM

## 2021-10-04 DIAGNOSIS — I700 Atherosclerosis of aorta: Secondary | ICD-10-CM

## 2021-10-04 DIAGNOSIS — N200 Calculus of kidney: Secondary | ICD-10-CM

## 2021-10-04 DIAGNOSIS — R160 Hepatomegaly, not elsewhere classified: Secondary | ICD-10-CM | POA: Diagnosis not present

## 2021-10-04 DIAGNOSIS — E559 Vitamin D deficiency, unspecified: Secondary | ICD-10-CM

## 2021-10-04 DIAGNOSIS — E1121 Type 2 diabetes mellitus with diabetic nephropathy: Secondary | ICD-10-CM | POA: Diagnosis not present

## 2021-10-04 DIAGNOSIS — R918 Other nonspecific abnormal finding of lung field: Secondary | ICD-10-CM

## 2021-10-04 DIAGNOSIS — E785 Hyperlipidemia, unspecified: Secondary | ICD-10-CM | POA: Diagnosis not present

## 2021-10-04 MED ORDER — TRULICITY 0.75 MG/0.5ML ~~LOC~~ SOAJ: 0.7500 mg | SUBCUTANEOUS | 0 refills | Status: DC

## 2021-10-04 NOTE — Patient Instructions (Signed)
Aaron Burns , Thank you for taking time to come for your Annual Wellness Visit. I appreciate your ongoing commitment to your health goals. Please review the following plan we discussed and let me know if I can assist you in the future.   These are the goals we discussed:  Goals      Blood Pressure < 130/80     HEMOGLOBIN A1C < 7.0     LDL CALC < 70     Weight (lb) < 240 lb (108.9 kg)        This is a list of the screening recommended for you and due dates:  Health Maintenance  Topic Date Due   COVID-19 Vaccine (1) Never done   Eye exam for diabetics  Never done   Pneumonia Vaccine (2 - PCV) 10/10/2018   Flu Shot  07/16/2021   Zoster (Shingles) Vaccine (1 of 2) 07/04/2022*   Hemoglobin A1C  01/04/2022   Complete foot exam   07/04/2022   Tetanus Vaccine  02/13/2025   Colon Cancer Screening  12/18/2028   Hepatitis C Screening: USPSTF Recommendation to screen - Ages 18-79 yo.  Completed   HPV Vaccine  Aged Out  *Topic was postponed. The date shown is not the original due date.    Once you get trulicity - stop glipizide and phentermine (also topamax if you'd like)   Can come to office for [place] to show you how to do your first injection if you'd like  Store med in refrigerator  Set phone alarm reminder to take once weekly    Dulaglutide Injection What is this medication? DULAGLUTIDE (DOO la GLOO tide) treats type 2 diabetes. It works by increasing insulin levels in your body, which decreases your blood sugar (glucose). It also reduces the amount of sugar released into your blood and slows down your digestion. It can also be used to lower the risk of heart attack and stroke in people with type 2 diabetes. Changes to diet and exercise are often combined with this medication. This medicine may be used for other purposes; ask your health care provider or pharmacist if you have questions. COMMON BRAND NAME(S): Trulicity What should I tell my care team before I take this  medication? They need to know if you have any of these conditions: Endocrine tumors (MEN 2) or if someone in your family had these tumors Eye disease, vision problems History of pancreatitis Kidney disease Liver disease Stomach or intestine problems Thyroid cancer or if someone in your family had thyroid cancer An unusual or allergic reaction to dulaglutide, other medications, foods, dyes, or preservatives Pregnant or trying to get pregnant Breast-feeding How should I use this medication? This medication is injected under the skin. You will be taught how to prepare and give it. Take it as directed on the prescription label on the same day of each week. Do NOT prime the pen. Keep taking it unless your care team tells you to stop. If you use this medication with insulin, you should inject this medication and the insulin separately. Do not mix them together. Do not give the injections right next to each other. Change (rotate) injection sites with each injection. This medication comes with INSTRUCTIONS FOR USE. Ask your pharmacist for directions on how to use this medication. Read the information carefully. Talk to your pharmacist or care team if you have questions. It is important that you put your used needles and syringes in a special sharps container. Do not put them in  a trash can. If you do not have a sharps container, call your pharmacist or care team to get one. A special MedGuide will be given to you by the pharmacist with each prescription and refill. Be sure to read this information carefully each time. Talk to your care team about the use of this medication in children. Special care may be needed. Overdosage: If you think you have taken too much of this medicine contact a poison control center or emergency room at once. NOTE: This medicine is only for you. Do not share this medicine with others. What if I miss a dose? If you miss a dose, take it as soon as you can unless it is more than  3 days late. If it is more than 3 days late, skip the missed dose. Take the next dose at the normal time. What may interact with this medication? Other medications for diabetes Many medications may cause changes in blood sugar, these include: Alcohol containing beverages Antiviral medications for HIV or AIDS Aspirin and aspirin-like medications Certain medications for blood pressure, heart disease, irregular heart beat Chromium Diuretics Male hormones, such as estrogens or progestins, birth control pills Fenofibrate Gemfibrozil Isoniazid Lanreotide Male hormones or anabolic steroids MAOIs like Carbex, Eldepryl, Marplan, Nardil, and Parnate Medications for allergies, asthma, cold, or cough Medications for depression, anxiety, or psychotic disturbances Medications for weight loss Niacin Nicotine NSAIDs, medications for pain and inflammation, like ibuprofen or naproxen Octreotide Pasireotide Pentamidine Phenytoin Probenecid Quinolone antibiotics such as ciprofloxacin, levofloxacin, ofloxacin Some herbal dietary supplements Steroid medications such as prednisone or cortisone Sulfamethoxazole; trimethoprim Thyroid hormones Some medications can hide the warning symptoms of low blood sugar (hypoglycemia). You may need to monitor your blood sugar more closely if you are taking one of these medications. These include: Beta-blockers, often used for high blood pressure or heart problems (examples include atenolol, metoprolol, propranolol) Clonidine Guanethidine Reserpine This list may not describe all possible interactions. Give your health care provider a list of all the medicines, herbs, non-prescription drugs, or dietary supplements you use. Also tell them if you smoke, drink alcohol, or use illegal drugs. Some items may interact with your medicine. What should I watch for while using this medication? Visit your care team for regular checks on your progress. Check with your care  team if you have severe diarrhea, nausea, and vomiting, or if you sweat a lot. The loss of too much body fluid may make it dangerous for you to take this medication. A test called the HbA1C (A1C) will be monitored. This is a simple blood test. It measures your blood sugar control over the last 2 to 3 months. You will receive this test every 3 to 6 months. Learn how to check your blood sugar. Learn the symptoms of low and high blood sugar and how to manage them. Always carry a quick-source of sugar with you in case you have symptoms of low blood sugar. Examples include hard sugar candy or glucose tablets. Make sure others know that you can choke if you eat or drink when you develop serious symptoms of low blood sugar, such as seizures or unconsciousness. Get medical help at once. Tell your care team if you have high blood sugar. You might need to change the dose of your medication. If you are sick or exercising more than usual, you may need to change the dose of your medication. Do not skip meals. Ask your care team if you should avoid alcohol. Many nonprescription cough and cold  products contain sugar or alcohol. These can affect blood sugar. Pens should never be shared. Even if the needle is changed, sharing may result in passing of viruses like hepatitis or HIV. Wear a medical ID bracelet or chain. Carry a card that describes your condition. List the medications and doses you take on the card. What side effects may I notice from receiving this medication? Side effects that you should report to your care team as soon as possible: Allergic reactions-skin rash, itching, hives, swelling of the face, lips, tongue, or throat Change in vision Dehydration-increased thirst, dry mouth, feeling faint or lightheaded, headache, dark yellow or brown urine Kidney injury-decrease in the amount of urine, swelling of the ankles, hands, or feet Pancreatitis-severe stomach pain that spreads to your back or gets worse  after eating or when touched, fever, nausea, vomiting Thyroid cancer-new mass or lump in the neck, pain or trouble swallowing, trouble breathing, hoarseness Side effects that usually do not require medical attention (report to your care team if they continue or are bothersome): Diarrhea Loss of appetite Nausea Stomach pain Vomiting This list may not describe all possible side effects. Call your doctor for medical advice about side effects. You may report side effects to FDA at 1-800-FDA-1088. Where should I keep my medication? Keep out of the reach of children and pets. Refrigeration (preferred): Store unopened pens in a refrigerator between 2 and 8 degrees C (36 and 46 degrees F). Keep it in the original carton until you are ready to take it. Do not freeze or use if the medication has been frozen. Protect from light. Get rid of any unused medication after the expiration date on the label. Room Temperature: The pen may be stored at room temperature below 30 degrees C (86 degrees F) for up to a total of 14 days if needed. Protect from light. Avoid exposure to extreme heat. If it is stored at room temperature, throw away any unused medication after 14 days or after it expires, whichever is first. To get rid of medications that are no longer needed or have expired: Take the medication to a medication take-back program. Check with your pharmacy or law enforcement to find a location. If you cannot return the medication, ask your pharmacist or care team how to get rid of this medication safely. NOTE: This sheet is a summary. It may not cover all possible information. If you have questions about this medicine, talk to your doctor, pharmacist, or health care provider.  2022 Elsevier/Gold Standard (2021-01-29 14:13:14)

## 2021-10-05 LAB — LIPID PANEL
Cholesterol: 150 mg/dL (ref <200)
HDL: 35 mg/dL — ABNORMAL LOW (ref >=40)
LDL Cholesterol (Calc): 72 mg/dL (calc)
Non-HDL Cholesterol (Calc): 115 mg/dL (calc) (ref <130)
Total CHOL/HDL Ratio: 4.3 (calc) (ref <5.0)
Triglycerides: 358 mg/dL — ABNORMAL HIGH (ref <150)

## 2021-10-05 LAB — COMPLETE METABOLIC PANEL WITH GFR
AG Ratio: 1.8 (calc) (ref 1.0–2.5)
ALT: 39 U/L (ref 9–46)
AST: 23 U/L (ref 10–35)
Albumin: 4.6 g/dL (ref 3.6–5.1)
Alkaline phosphatase (APISO): 47 U/L (ref 35–144)
BUN/Creatinine Ratio: 17 (calc) (ref 6–22)
BUN: 28 mg/dL — ABNORMAL HIGH (ref 7–25)
CO2: 30 mmol/L (ref 20–32)
Calcium: 10.9 mg/dL — ABNORMAL HIGH (ref 8.6–10.3)
Chloride: 103 mmol/L (ref 98–110)
Creat: 1.68 mg/dL — ABNORMAL HIGH (ref 0.70–1.35)
Globulin: 2.6 g/dL (calc) (ref 1.9–3.7)
Glucose, Bld: 158 mg/dL — ABNORMAL HIGH (ref 65–99)
Potassium: 5.1 mmol/L (ref 3.5–5.3)
Sodium: 141 mmol/L (ref 135–146)
Total Bilirubin: 0.4 mg/dL (ref 0.2–1.2)
Total Protein: 7.2 g/dL (ref 6.1–8.1)
eGFR: 45 mL/min/{1.73_m2} — ABNORMAL LOW (ref >=60)

## 2021-10-05 LAB — CBC WITH DIFFERENTIAL/PLATELET
Absolute Monocytes: 678 cells/uL (ref 200–950)
Basophils Absolute: 77 cells/uL (ref 0–200)
Basophils Relative: 1 %
Eosinophils Absolute: 100 cells/uL (ref 15–500)
Eosinophils Relative: 1.3 %
HCT: 43.9 % (ref 38.5–50.0)
Hemoglobin: 14.6 g/dL (ref 13.2–17.1)
Lymphs Abs: 1879 cells/uL (ref 850–3900)
MCH: 30.4 pg (ref 27.0–33.0)
MCHC: 33.3 g/dL (ref 32.0–36.0)
MCV: 91.3 fL (ref 80.0–100.0)
MPV: 10.9 fL (ref 7.5–12.5)
Monocytes Relative: 8.8 %
Neutro Abs: 4967 cells/uL (ref 1500–7800)
Neutrophils Relative %: 64.5 %
Platelets: 250 10*3/uL (ref 140–400)
RBC: 4.81 10*6/uL (ref 4.20–5.80)
RDW: 12.9 % (ref 11.0–15.0)
Total Lymphocyte: 24.4 %
WBC: 7.7 10*3/uL (ref 3.8–10.8)

## 2021-10-05 LAB — TSH: TSH: 3.18 mIU/L (ref 0.40–4.50)

## 2021-10-05 LAB — MAGNESIUM: Magnesium: 1.7 mg/dL (ref 1.5–2.5)

## 2021-10-05 LAB — HEMOGLOBIN A1C
Hgb A1c MFr Bld: 7.5 % of total Hgb — ABNORMAL HIGH (ref <5.7)
Mean Plasma Glucose: 169 mg/dL
eAG (mmol/L): 9.3 mmol/L

## 2021-10-25 ENCOUNTER — Encounter: Payer: Self-pay | Admitting: Internal Medicine

## 2021-11-01 ENCOUNTER — Other Ambulatory Visit: Payer: Self-pay | Admitting: Nurse Practitioner

## 2021-11-01 ENCOUNTER — Other Ambulatory Visit: Payer: Self-pay | Admitting: Adult Health

## 2021-12-05 ENCOUNTER — Other Ambulatory Visit: Payer: Self-pay | Admitting: Adult Health

## 2021-12-05 ENCOUNTER — Other Ambulatory Visit: Payer: Self-pay | Admitting: Nurse Practitioner

## 2022-01-08 NOTE — Progress Notes (Signed)
3 MONTH FOLLOW UP Assessment:    Essential hypertension Monitor blood pressure at home; call if consistently over 130/80 Continue DASH diet.   Reminder to go to the ER if any CP, SOB, nausea, dizziness, severe HA, changes vision/speech, left arm numbness and tingling and jaw pain.  Leg DVT (deep venous thromboembolism), chronic, left (HCC) Chronic; vascular evaluated and advised stop elequis; monitor  Type 2 diabetes mellitus with stage 1 chronic kidney disease, without long-term current use of insulin ([place]) Education: Reviewed ABCs of diabetes management (respective goals in parentheses):  A1C (<7), blood pressure (<130/80), and cholesterol (LDL <70) Eye Exam yearly and Dental Exam every 6 months.-OVERDUE, will refer to ophth Dietary recommendations Physical Activity recommendations Doing well with trulicity - will increase dose to 1.5 mg after  Hemoglobin A1c q70m  Hyperlipidemia associated with type 2 diabetes mellitus (HCC) Titrate statin for LDL goal <70 Continue low cholesterol diet and exercise.  Check lipid panel.   CKD stage 1 due to type 2 diabetes mellitus (HCC) Increase fluids, avoid NSAIDS, monitor sugars, will monitor  Microalbuminuric diabetic nephropathy (HCC) Increase fluids, avoid NSAIDS, monitor sugars, will monitor; on ACE/ARB  - recheck microalbumin   Gastroesophageal reflux disease, unspecified whether esophagitis present Well managed on current medications Discussed diet, avoiding triggers and other lifestyle changes  Fatty liver Weight loss advised, avoid alcohol/tylenol, low carb diet, will monitor LFTs Dr. Paulita Burns following  Hepatomegaly Dr. 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 Return in 3 months  Medication management Due at each visit   Orders Placed This Encounter  Procedures   CBC with Differential/Platelet   COMPLETE METABOLIC PANEL WITH GFR   Magnesium   Lipid panel   TSH   Hemoglobin A1c   Microalbumin / creatinine urine ratio   Ambulatory referral to Ophthalmology    Over 30 minutes of exam, counseling, chart review, and critical decision making was performed  Future Appointments  Date Time Provider [place]  07/04/2022  9:00 AM Aaron Comber, NP [facility] None  10/01/2022 11:00 AM Aaron Comber, NP [facility] None     Subjective:  Aaron Burns is a 66 y.o. male who presents for 3 month follow up for HTN, hyperlipidemia, T2DM with diabetic nephropathy, and vitamin D Def.   He has lumbar fusion in 09/2019 by Dr. Rolena Burns, does have some residual pain limited to lower back without radicular sx. Takes 2 tab regular tylenol but limited benefit. Was recommended weight loss. Had DVT complication after surgery - Notably saw Dr. Sherren Burns Early in Juy 2021 for left chronic DVT persistent despite xarelto x 6+ months, was advised to taper off, no further follow up was advised unless new sx.   Hx of elevated LFTs; [place] 04/2020 showed hepatomegaly and diffuse hepatic steatosis, several small hyperecchoic masses within spleen, possible hemangiomas - he was referred to GI Dr. 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 Note recent CT 08/12/2021 shows progression of fatty liver and interval enlargement of liver.   He is  former smoker, quit in 2020, 40 pack year history. He had screening CT scan 07/23/2021 that showed concerning 9.2 mm nodule of RML recommended for 3 month follow up, CT order was placed but never scheduled, some cost concerns.  BMI is Body mass index is 36.8 kg/m., he has been working on diet and exercise, Prescribed phentermine/topamax with benefit when takes, no SE., takes regular breaks. He has jointed silver sneakers but doesn't go too frequently, trying to increase, trying to get wife to join him. He reports he did cut down on beer, minimal now. He is also newly on trulicity, down 7 lb. At this point he states he would like to continue with phentermine/topamax. Wt Readings from Last 3 Encounters:  01/09/22 251 lb (113.9 kg)  10/04/21 258 lb (117 kg)  08/30/21 259 lb (117.5 kg)   His blood pressure has been controlled at home, today their BP is BP: 124/74 He does workout. He denies chest pain, shortness of breath, dizziness.   He has aortic atherosclerosis and coronary calcifications per CT 07/23/2021.    He is on cholesterol medication (rosuvastatin 5 mg and and fenofibrate 145 mg daily) denies myalgias. His cholesterol is not at goal. Reports rare alcohol. The cholesterol last visit was:   Lab Results  Component Value Date   CHOL 150 10/04/2021   HDL 35 (L) 10/04/2021   LDLCALC 72 10/04/2021   TRIG 358 (H) 10/04/2021   CHOLHDL 4.3 10/04/2021   He has been working on diet and exercise for T2 diabetes, on metformin 5284 mg daily, trulicity was given last visit and tolerating 0.75 mg and denies hyperglycemia, hypoglycemia , increased appetite, nausea, polydipsia, polyuria and visual disturbances. He reports fasting running around 120-130.  He does have mild tingling in bil toes. Overdue for diabetes eye exam -  Last A1C in the office was:  Lab Results  Component Value Date   HGBA1C 7.5 (H) 10/04/2021   He has CKD II/III associated with htn and T2DM monitored at this office. He is on  ARB (losartan 100 mg daily). Last GFR Lab Results  Component Value Date   GFRNONAA 59 (L) 05/07/2021   GFRNONAA 49 (L) 04/02/2021   GFRNONAA 72 12/04/2020   Patient is on Vitamin D supplement.   Lab Results  Component Value Date   VD25OH 88 07/04/2021       Medication Review:  Current Outpatient Medications (Endocrine & Metabolic):    Dulaglutide (TRULICITY) 1.5 XL/2.4MW SOPN, Inject 1.5 mg into the skin once a week.   metFORMIN (GLUCOPHAGE-XR) 500 MG 24 hr tablet, Take   2 tablets   2 x /day    with Meals    for Diabetes  Current Outpatient Medications (Cardiovascular):    fenofibrate (TRICOR) 145 MG tablet, Take 1 tablet (145 mg total) by mouth daily.   losartan-hydrochlorothiazide (HYZAAR) 100-25 MG tablet, TAKE ONE TABLET BY MOUTH DAILY FOR BLOOD PRESSURE   rosuvastatin (CRESTOR) 5 MG tablet, TAKE ONE TABLET BY MOUTH ONCE WEEKLY IN THE EVENINGS ON SUNDAYS   Current Outpatient Medications (Analgesics):    acetaminophen (TYLENOL) 325 MG tablet, Take 2 tablets (650 mg total) by mouth every 4 (four) hours as needed for mild pain ((score 1 to 3) or temp > 100.5).   aspirin EC 81 MG tablet, Take 81 mg by mouth daily. Swallow whole.   Current  Outpatient Medications (Other):    Blood Glucose Monitoring Suppl DEVI, Use to check blood sugar once daily   Cholecalciferol 125 MCG (5000 UT) capsule, Take 10,000 Units by mouth daily.    CINNAMON PO, Take 1,000 mg by mouth daily.   glucose blood (FREESTYLE LITE) test strip, Test blood sugar once daily   Lancets MISC, Test blood sugar once daily.   Magnesium 250 MG TABS, Take 1 tablet (250 mg total) by mouth 2 (two) times daily with a meal.   Multiple Vitamin (MULTIVITAMIN WITH MINERALS) TABS, Take 1 tablet by mouth daily.   phentermine (ADIPEX-P) 37.5 MG tablet, TAKE ONE TABLET BY MOUTH DAILY FOR DIETING AND WEIGHT LOSS   tamsulosin (FLOMAX) 0.4 MG CAPS capsule, TAKE ONE CAPSULE BY MOUTH DAILY AS NEEDED FOR KIDNEY STONE   topiramate  (TOPAMAX) 25 MG tablet, Take  1 to 2 tablets  at Night  for Dieting & Weight Loss   vitamin C (ASCORBIC ACID) 500 MG tablet, Take 1,000 mg by mouth daily.   zinc gluconate 50 MG tablet, Take 50 mg by mouth daily.   famotidine (PEPCID) 20 MG tablet, Take 1 tablet (20 mg total) by mouth 2 (two) times daily as needed for heartburn or indigestion.  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   Surgical: He  has a past surgical history that includes Hernia repair; Cystoscopy/retrograde/ureteroscopy/stone extraction with basket (5/11); Wisdom tooth extraction (yrs ago); Knee surgery (Right, 11/25/11 and 2015); Cholecystectomy (07/27/2012); Total knee arthroplasty (Right, 06/06/2015); Colonoscopy; Anterior lumbar fusion (N/A, 10/07/2019); and Abdominal exposure (N/A, 10/07/2019). Family His family history includes Diabetes in his brother, daughter, father, mother, and sister; Hypertension in his mother; Kidney failure in his mother. Social history  He reports that he quit smoking about 2 years ago. His smoking use included cigarettes. He started smoking about 44 years ago. He has a  41.00 pack-year smoking history. He has never used smokeless tobacco. He reports current alcohol use. He reports that he does not use drugs.  Review of Systems  Constitutional:  Negative for malaise/fatigue and weight loss.  HENT:  Negative for hearing loss and tinnitus.   Eyes:  Negative for blurred vision and double vision.  Respiratory:  Negative for cough, sputum production, shortness of breath and wheezing.   Cardiovascular:  Negative for chest pain, palpitations, orthopnea, claudication, leg swelling and PND.  Gastrointestinal:  Negative for abdominal pain, blood in stool, constipation, diarrhea, heartburn, melena, nausea and vomiting.  Genitourinary: Negative.   Musculoskeletal:  Positive for back pain (chronic, lumbar). Negative for falls, joint pain and myalgias.  Skin:  Negative for rash.  Neurological:  Positive for tingling (bil feet, mild, unchanged). Negative for dizziness, sensory change, weakness and headaches.  Endo/Heme/Allergies:  Negative for polydipsia.  Psychiatric/Behavioral: Negative.  Negative for depression,  memory loss, substance abuse and suicidal ideas. The patient is not nervous/anxious and does not have insomnia.   All other systems reviewed and are negative.    Objective:   Today's Vitals   01/09/22 0921  BP: 124/74  Pulse: 80  Temp: 97.7 F (36.5 C)  SpO2: 96%  Weight: 251 lb (113.9 kg)    Body mass index is 36.8 kg/m.  General appearance: alert, no distress, WD/WN, morbidly obese male HEENT: normocephalic, sclerae anicteric, TMs pearly, nares patent, no discharge or erythema, pharynx normal Oral cavity: MMM, no lesions Neck: supple, no lymphadenopathy, no thyromegaly, no masses Heart: RRR, normal S1, S2, no murmurs Lungs: CTA bilaterally, no wheezes, rhonchi, or rales Abdomen: +bs, soft obses, non tender, non distended, no masses, no hepatomegaly, no splenomegaly Musculoskeletal: nontender, no swelling, no obvious deformity Extremities: no  edema, no cyanosis, no clubbing Pulses: 2+ symmetric, upper and lower extremities, normal cap refill Neurological: alert, oriented x 3, CN2-12 intact, strength normal upper extremities and lower extremities, sensation normal with monofilament throughout, DTRs 2+ throughout, no cerebellar signs, gait normal Psychiatric: normal affect, behavior normal, pleasant   Izora Ribas, NP   01/09/2022

## 2022-01-09 ENCOUNTER — Encounter: Payer: Self-pay | Admitting: Adult Health

## 2022-01-09 ENCOUNTER — Ambulatory Visit (INDEPENDENT_AMBULATORY_CARE_PROVIDER_SITE_OTHER): Payer: PPO | Admitting: Adult Health

## 2022-01-09 ENCOUNTER — Other Ambulatory Visit: Payer: Self-pay

## 2022-01-09 VITALS — BP 124/74 | HR 80 | Temp 97.7°F | Wt 251.0 lb

## 2022-01-09 DIAGNOSIS — E1122 Type 2 diabetes mellitus with diabetic chronic kidney disease: Secondary | ICD-10-CM

## 2022-01-09 DIAGNOSIS — I82502 Chronic embolism and thrombosis of unspecified deep veins of left lower extremity: Secondary | ICD-10-CM | POA: Diagnosis not present

## 2022-01-09 DIAGNOSIS — Z135 Encounter for screening for eye and ear disorders: Secondary | ICD-10-CM | POA: Diagnosis not present

## 2022-01-09 DIAGNOSIS — Z79899 Other long term (current) drug therapy: Secondary | ICD-10-CM

## 2022-01-09 DIAGNOSIS — R160 Hepatomegaly, not elsewhere classified: Secondary | ICD-10-CM

## 2022-01-09 DIAGNOSIS — R918 Other nonspecific abnormal finding of lung field: Secondary | ICD-10-CM

## 2022-01-09 DIAGNOSIS — N181 Chronic kidney disease, stage 1: Secondary | ICD-10-CM

## 2022-01-09 DIAGNOSIS — E559 Vitamin D deficiency, unspecified: Secondary | ICD-10-CM

## 2022-01-09 DIAGNOSIS — I700 Atherosclerosis of aorta: Secondary | ICD-10-CM | POA: Diagnosis not present

## 2022-01-09 DIAGNOSIS — I10 Essential (primary) hypertension: Secondary | ICD-10-CM | POA: Diagnosis not present

## 2022-01-09 DIAGNOSIS — E1121 Type 2 diabetes mellitus with diabetic nephropathy: Secondary | ICD-10-CM | POA: Diagnosis not present

## 2022-01-09 DIAGNOSIS — E1169 Type 2 diabetes mellitus with other specified complication: Secondary | ICD-10-CM | POA: Diagnosis not present

## 2022-01-09 DIAGNOSIS — E785 Hyperlipidemia, unspecified: Secondary | ICD-10-CM | POA: Diagnosis not present

## 2022-01-09 MED ORDER — TRULICITY 1.5 MG/0.5ML ~~LOC~~ SOAJ: 1.5000 mg | SUBCUTANEOUS | 1 refills | Status: DC

## 2022-01-09 NOTE — Addendum Note (Signed)
Addended by: Izora Ribas on: 01/09/2022 05:08 PM   Modules accepted: Orders

## 2022-01-10 LAB — CBC WITH DIFFERENTIAL/PLATELET
Absolute Monocytes: 704 cells/uL (ref 200–950)
Basophils Absolute: 64 cells/uL (ref 0–200)
Basophils Relative: 0.8 %
Eosinophils Absolute: 120 cells/uL (ref 15–500)
Eosinophils Relative: 1.5 %
HCT: 43.6 % (ref 38.5–50.0)
Hemoglobin: 14.3 g/dL (ref 13.2–17.1)
Lymphs Abs: 1832 cells/uL (ref 850–3900)
MCH: 30 pg (ref 27.0–33.0)
MCHC: 32.8 g/dL (ref 32.0–36.0)
MCV: 91.6 fL (ref 80.0–100.0)
MPV: 10.9 fL (ref 7.5–12.5)
Monocytes Relative: 8.8 %
Neutro Abs: 5280 cells/uL (ref 1500–7800)
Neutrophils Relative %: 66 %
Platelets: 257 10*3/uL (ref 140–400)
RBC: 4.76 10*6/uL (ref 4.20–5.80)
RDW: 12.8 % (ref 11.0–15.0)
Total Lymphocyte: 22.9 %
WBC: 8 10*3/uL (ref 3.8–10.8)

## 2022-01-10 LAB — COMPLETE METABOLIC PANEL WITH GFR
AG Ratio: 1.7 (calc) (ref 1.0–2.5)
ALT: 34 U/L (ref 9–46)
AST: 23 U/L (ref 10–35)
Albumin: 4.7 g/dL (ref 3.6–5.1)
Alkaline phosphatase (APISO): 44 U/L (ref 35–144)
BUN/Creatinine Ratio: 19 (calc) (ref 6–22)
BUN: 31 mg/dL — ABNORMAL HIGH (ref 7–25)
CO2: 32 mmol/L (ref 20–32)
Calcium: 10.6 mg/dL — ABNORMAL HIGH (ref 8.6–10.3)
Chloride: 105 mmol/L (ref 98–110)
Creat: 1.59 mg/dL — ABNORMAL HIGH (ref 0.70–1.35)
Globulin: 2.7 g/dL (calc) (ref 1.9–3.7)
Glucose, Bld: 131 mg/dL — ABNORMAL HIGH (ref 65–99)
Potassium: 4.6 mmol/L (ref 3.5–5.3)
Sodium: 143 mmol/L (ref 135–146)
Total Bilirubin: 0.4 mg/dL (ref 0.2–1.2)
Total Protein: 7.4 g/dL (ref 6.1–8.1)
eGFR: 47 mL/min/{1.73_m2} — ABNORMAL LOW (ref >=60)

## 2022-01-10 LAB — LIPID PANEL
Cholesterol: 125 mg/dL (ref <200)
HDL: 32 mg/dL — ABNORMAL LOW (ref >=40)
LDL Cholesterol (Calc): 61 mg/dL (calc)
Non-HDL Cholesterol (Calc): 93 mg/dL (calc) (ref <130)
Total CHOL/HDL Ratio: 3.9 (calc) (ref <5.0)
Triglycerides: 306 mg/dL — ABNORMAL HIGH (ref <150)

## 2022-01-10 LAB — TSH: TSH: 2.79 mIU/L (ref 0.40–4.50)

## 2022-01-10 LAB — HEMOGLOBIN A1C
Hgb A1c MFr Bld: 7.1 % of total Hgb — ABNORMAL HIGH (ref <5.7)
Mean Plasma Glucose: 157 mg/dL
eAG (mmol/L): 8.7 mmol/L

## 2022-01-10 LAB — MICROALBUMIN / CREATININE URINE RATIO
Creatinine, Urine: 139 mg/dL (ref 20–320)
Microalb Creat Ratio: 19 mcg/mg creat (ref <30)
Microalb, Ur: 2.6 mg/dL

## 2022-01-10 LAB — MAGNESIUM: Magnesium: 2 mg/dL (ref 1.5–2.5)

## 2022-01-28 ENCOUNTER — Other Ambulatory Visit: Payer: Self-pay | Admitting: Internal Medicine

## 2022-01-28 DIAGNOSIS — E119 Type 2 diabetes mellitus without complications: Secondary | ICD-10-CM

## 2022-02-14 ENCOUNTER — Ambulatory Visit
Admission: RE | Admit: 2022-02-14 | Discharge: 2022-02-14 | Disposition: A | Discharge status: Home / Self Care | Payer: PPO | Source: Ambulatory Visit | Attending: Adult Health | Admitting: Adult Health

## 2022-02-14 DIAGNOSIS — J439 Emphysema, unspecified: Secondary | ICD-10-CM | POA: Diagnosis not present

## 2022-02-14 DIAGNOSIS — R911 Solitary pulmonary nodule: Secondary | ICD-10-CM | POA: Diagnosis not present

## 2022-02-14 DIAGNOSIS — I10 Essential (primary) hypertension: Secondary | ICD-10-CM

## 2022-02-14 DIAGNOSIS — R918 Other nonspecific abnormal finding of lung field: Secondary | ICD-10-CM

## 2022-02-14 DIAGNOSIS — Z87891 Personal history of nicotine dependence: Secondary | ICD-10-CM

## 2022-02-16 ENCOUNTER — Encounter: Payer: Self-pay | Admitting: Adult Health

## 2022-02-16 DIAGNOSIS — I2510 Atherosclerotic heart disease of native coronary artery without angina pectoris: Secondary | ICD-10-CM | POA: Insufficient documentation

## 2022-02-20 DIAGNOSIS — E119 Type 2 diabetes mellitus without complications: Secondary | ICD-10-CM | POA: Diagnosis not present

## 2022-02-20 LAB — HM DIABETES EYE EXAM

## 2022-03-03 ENCOUNTER — Other Ambulatory Visit: Payer: Self-pay | Admitting: Nurse Practitioner

## 2022-03-03 ENCOUNTER — Other Ambulatory Visit: Payer: Self-pay | Admitting: Adult Health

## 2022-03-03 DIAGNOSIS — E1169 Type 2 diabetes mellitus with other specified complication: Secondary | ICD-10-CM

## 2022-04-10 NOTE — Progress Notes (Signed)
3 MONTH FOLLOW UP ?Assessment:  ? ? ?Essential hypertension ?Monitor blood pressure at home; call if consistently over 130/80 ?Continue DASH diet.   ?Reminder to go to the ER if any CP, SOB, nausea, dizziness, severe HA, changes vision/speech, left arm numbness and tingling and jaw pain. ? ?Leg DVT (deep venous thromboembolism), chronic, left ([place]) ?Chronic; vascular evaluated and advised stop elequis; monitor ? ?Type 2 diabetes mellitus with stage 1 chronic kidney disease, without long-term current use of insulin ([place]) ?Education: Reviewed ?ABCs? of diabetes management (respective goals in parentheses):  A1C (<7), blood pressure (<130/80), and cholesterol (LDL <70) ?Eye Exam yearly and Dental Exam every 6 months.- ?Dietary recommendations ?Physical Activity recommendations ?Doing well with trulicity - continue 1.5 mg/week, metformin  ?Hemoglobin A1c q22m? ?Hyperlipidemia associated with type 2 diabetes mellitus ([place] ?Titrate statin for LDL goal <70 ?Continue low cholesterol diet and exercise.  ?Check lipid panel.  ? ?CKD stage 3 due to type 2 diabetes mellitus ([place] ?Increase fluids, avoid NSAIDS, monitor sugars, will monitor ? ?Microalbuminuric diabetic nephropathy ([place] ?Increase fluids, avoid NSAIDS, monitor sugars, will monitor;  ?Improved on ACE/ARB  ? ?Gastroesophageal reflux disease, unspecified whether esophagitis present ?Well managed on current medications ?Discussed diet, avoiding triggers and other lifestyle changes ? ?Fatty liver ?Weight loss advised, avoid alcohol/tylenol, low carb diet, will monitor LFTs ?Dr. OPaulita Fujitafollowing ? ?Hepatomegaly ?Dr. OPaulita Fujitafollowing, avoid tylenol, alcohol, weight loss encouraged ? 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 ?Lung nodule ?Follow up CT 02/14/2022 was benign ?Resume 12 month follow up ? ?Vitamin Aaron deficiency ?At goal at recent check; continue to recommend supplementation for goal of  60-100 ?Check vitamin Aaron annually and PRN ? ?Morbid obesity (HCC) - BMI 36 ?Doing well with slow steady progress ?Long discussion about weight loss, diet, and exercise ?Encouraged to add some resistance exercises  ?Patient on phentermine/topamax but states would like to continue for now  ?- doing well with trulicity 1.5 mg  ?continue close follow up. Return in 3 months ? ?Medication management ?Due at each visit  ? ?Environmental allergies ?Add daily antihistamine, avoid triggers, wear mask when mowing, rinse face/saline irrigations, flonase and mucinex PRN ?Follow up in office if progressive worsening  ? ?Orders Placed This Encounter  ?Procedures  ? CBC with Differential/Platelet  ? COMPLETE METABOLIC PANEL WITH GFR  ? Magnesium  ? Lipid panel  ? TSH  ? Hemoglobin A1c  ? ? ?Over 30 minutes of exam, counseling, chart review, and critical decision making was performed ? ?Future Appointments  ?Date Time Provider [place] ?07/10/2022  9:00 AM CLiane Comber NP [facility] None  ?10/09/2022  9:00 AM CLiane Comber NP [facility] None  ? ? ? ?Subjective:  ?[name] a 66y.o. male who presents for 3 month follow up for HTN, hyperlipidemia, T2DM with diabetic nephropathy, and vitamin Aaron Def.  ? ?He has lumbar fusion in 09/2019 by Dr. BRolena Infante does have some residual pain limited to lower back without radicular sx. Takes 2 tab regular tylenol but limited benefit. Was recommended weight loss. Had DVT complication after surgery - Notably saw Dr. TSherren MochaEarly in Juy 2021 for left chronic DVT persistent despite xarelto x 6+ months, was advised to taper off, no further follow up was advised unless new sx.  ? ?Hx of elevated LFTs; [place] showed hepatomegaly and diffuse hepatic steatosis, several small hyperecchoic masses within spleen, possible hemangiomas - he was  referred to GI Dr. 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 Note recent CT 08/12/2021  shows progression of fatty liver and interval enlargement of liver. He has reduced alcohol, working on weight loss.  ? ?He is former smoker, quit in 2020, 40 pack year history. He had screening CT scan 07/23/2021 that showed concerning 9.2 mm nodule of RML recommended for 3 month follow up, CT was repeated 02/14/2022 with lung-RADS 2, 12 month follow up with low dose chest CT w/o contrast was recommended.  ? ?BMI is Body mass index is 36.36 kg/m?., he has been working on diet and exercise, Prescribed phentermine/topamax with benefit when takes, no SE., takes regular breaks. He has jointed silver sneakers but doesn't go too frequently, trying to increase, trying to get wife to join him. He has free weights at home and receptive to start.  ?He reports he did cut down on beer, minimal now. He is also newly on trulicity, down another 3 lb, has noted less sugar/carb cravings. At this point he states he would like to continue with phentermine/topamax. Doing chicken/sweet potato/ veggie at home ?Wt Readings from Last 3 Encounters:  ?04/11/22 248 lb (112.5 kg)  ?01/09/22 251 lb (113.9 kg)  ?10/04/21 258 lb (117 kg)  ? ?He has aortic atherosclerosis and coronary calcifications per CT 07/23/2021.  ? ?His blood pressure has been controlled at home, today their BP is BP: 120/68 ?He does workout. He denies chest pain, shortness of breath, dizziness.  ? ?He is on cholesterol medication (rosuvastatin 5 mg and and fenofibrate 145 mg daily) denies myalgias. His cholesterol is not at goal. Reports rare alcohol. The cholesterol last visit was:   ?Lab Results  ?Component Value Date  ? CHOL 125 01/09/2022  ? HDL 32 (L) 01/09/2022  ? [place] 61 01/09/2022  ? TRIG 306 (H) 01/09/2022  ? CHOLHDL 3.9 01/09/2022  ? ?He has been working on diet and exercise for T2 diabetes, on metformin 2000 mg daily, up to trulicity 1.5 mg weekly since last visit ?and denies hyperglycemia, hypoglycemia , increased appetite, nausea, polydipsia, polyuria and visual  disturbances. ?He reports fasting running low 100s-124, has had several <100 ?He does have mild tingling in bil toes. ?Reports had diabetes eye exam Spring 2023, [facility] on [address], report requested ?Last A1C in the office was:  ?Lab Results  ?Component Value Date  ? HGBA1C 7.1 (H) 01/09/2022  ? ?He has CKD II/III associated with htn and T2DM monitored at this office. He is on ARB (losartan 100 mg daily). Drinks at minimum 65+ fluid ounces. Denies NSAIDs. Last GFR ?Lab Results  ?Component Value Date  ? EGFR 47 (L) 01/09/2022  ? EGFR 45 (L) 10/04/2021  ? EGFR 54 (L) 08/30/2021  ? ?Patient is on Vitamin Aaron supplement.   ?Lab Results  ?Component Value Date  ? VD25OH 88 07/04/2021  ?  ? ? ? ?Medication Review: ? ?Current Outpatient Medications (Endocrine & Metabolic):  ?  Dulaglutide (TRULICITY) 1.5 FO/2.7XA SOPN, Inject 1.5 mg into the skin once a week. ?  metFORMIN (GLUCOPHAGE-XR) 500 MG 24 hr tablet, TAKE TWO TABLETS BY MOUTH TWICE A DAY WITH A MEAL FOR DIABETES ? ?Current Outpatient Medications (Cardiovascular):  ?  fenofibrate (TRICOR) 145 MG tablet, TAKE ONE TABLET BY MOUTH DAILY ?  losartan-hydrochlorothiazide (HYZAAR) 100-25 MG tablet, TAKE ONE TABLET BY MOUTH DAILY FOR BLOOD PRESSURE ?  rosuvastatin (CRESTOR) 5 MG tablet, TAKE ONE TABLET BY MOUTH ONCE WEEKLY IN THE  EVENINGS ON SUNDAYS ? ? ?Current Outpatient Medications (Analgesics):  ?  acetaminophen (TYLENOL) 325 MG tablet, Take 2 tablets (650 mg total) by mouth every 4 (four) hours as needed for mild pain ((score 1 to 3) or temp > 100.5). ?  aspirin EC 81 MG tablet, Take 81 mg by mouth daily. Swallow whole. ? ? ?Current Outpatient Medications (Other):  ?  Blood Glucose Monitoring Suppl DEVI, Use to check blood sugar once daily ?  Cholecalciferol 125 MCG (5000 UT) capsule, Take 10,000 Units by mouth daily.  ?  CINNAMON PO, Take 1,000 mg by mouth daily. ?  glucose blood (FREESTYLE LITE) test strip, Test blood sugar once daily ?  Lancets MISC, Test blood  sugar once daily. ?  Magnesium 250 MG TABS, Take 1 tablet (250 mg total) by mouth 2 (two) times daily with a meal. (Patient taking differently: Take 400 mg by mouth every other day.) ?  Multiple Vitamin (M

## 2022-04-11 ENCOUNTER — Ambulatory Visit (INDEPENDENT_AMBULATORY_CARE_PROVIDER_SITE_OTHER): Payer: PPO | Admitting: Adult Health

## 2022-04-11 ENCOUNTER — Encounter: Payer: Self-pay | Admitting: Adult Health

## 2022-04-11 VITALS — BP 120/68 | HR 82 | Temp 97.5°F | Wt 248.0 lb

## 2022-04-11 DIAGNOSIS — Z9109 Other allergy status, other than to drugs and biological substances: Secondary | ICD-10-CM

## 2022-04-11 DIAGNOSIS — N1831 Chronic kidney disease, stage 3a: Secondary | ICD-10-CM

## 2022-04-11 DIAGNOSIS — I700 Atherosclerosis of aorta: Secondary | ICD-10-CM

## 2022-04-11 DIAGNOSIS — E1169 Type 2 diabetes mellitus with other specified complication: Secondary | ICD-10-CM

## 2022-04-11 DIAGNOSIS — R918 Other nonspecific abnormal finding of lung field: Secondary | ICD-10-CM

## 2022-04-11 DIAGNOSIS — Z79899 Other long term (current) drug therapy: Secondary | ICD-10-CM | POA: Diagnosis not present

## 2022-04-11 DIAGNOSIS — E1121 Type 2 diabetes mellitus with diabetic nephropathy: Secondary | ICD-10-CM | POA: Diagnosis not present

## 2022-04-11 DIAGNOSIS — E785 Hyperlipidemia, unspecified: Secondary | ICD-10-CM

## 2022-04-11 DIAGNOSIS — Z87891 Personal history of nicotine dependence: Secondary | ICD-10-CM | POA: Diagnosis not present

## 2022-04-11 DIAGNOSIS — N181 Chronic kidney disease, stage 1: Secondary | ICD-10-CM | POA: Diagnosis not present

## 2022-04-11 DIAGNOSIS — N183 Chronic kidney disease, stage 3 unspecified: Secondary | ICD-10-CM

## 2022-04-11 DIAGNOSIS — E559 Vitamin D deficiency, unspecified: Secondary | ICD-10-CM

## 2022-04-11 DIAGNOSIS — I10 Essential (primary) hypertension: Secondary | ICD-10-CM | POA: Diagnosis not present

## 2022-04-11 DIAGNOSIS — E1122 Type 2 diabetes mellitus with diabetic chronic kidney disease: Secondary | ICD-10-CM | POA: Diagnosis not present

## 2022-04-11 NOTE — Patient Instructions (Addendum)
?Allergies, Adult ?An allergy is a condition in which the body's defense system (immune system) comes in contact with an allergen and reacts to it. An allergen is anything that causes an allergic reaction. Allergens cause the immune system to make proteins for fighting infections (antibodies). These antibodies cause cells to release chemicals called histamines that set off the symptoms of an allergic reaction. ?Allergies often affect the nasal passages (allergic rhinitis), eyes (allergic conjunctivitis), skin (atopic dermatitis), and stomach. Allergies can be mild, moderate, or severe. They cannot spread from person to person. Allergies can develop at any age and may be outgrown. ?What are the causes? ?This condition is caused by allergens. Common allergens include: ?Outdoor allergens, such as pollen, car fumes, and mold. ?Indoor allergens, such as dust, smoke, mold, and pet dander. ?Other allergens, such as foods, medicines, scents, insect bites or stings, and other skin irritants. ?What increases the risk? ?You are more likely to develop this condition if you have: ?Family members with allergies. ?Family members who have any condition that may be caused by allergens, such as asthma. This may make you more likely to have other allergies. ?What are the signs or symptoms? ?Symptoms of this condition depend on the severity of the allergy. ?Mild to moderate symptoms ?Runny nose, stuffy nose (nasal congestion), or sneezing. ?Itchy mouth, ears, or throat. ?A feeling of mucus dripping down the back of your throat (postnasal drip). ?Sore throat. ?Itchy, red, watery, or puffy eyes. ?Skin rash, or itchy, red, swollen areas of skin (hives). ?Stomach cramps or bloating. ?Severe symptoms ?Severe allergies to food, medicine, or insect bites may cause anaphylaxis, which can be life-threatening. Symptoms include: ?A red (flushed) face. ?Wheezing or coughing. ?Swollen lips, tongue, or mouth. ?Tight or swollen throat. ?Chest pain  or tightness, or rapid heartbeat. ?Trouble breathing or shortness of breath. ?Pain in the abdomen, vomiting, or diarrhea. ?Dizziness or fainting. ?How is this diagnosed? ?This condition is diagnosed based on your symptoms, your family and medical history, and a physical exam. You may also have tests, including: ?Skin tests to see how your skin reacts to allergens that may be causing your symptoms. Tests include: ?Skin prick test. For this test, an allergen is introduced to your body through a small opening in the skin. ?Intradermal skin test. For this test, a small amount of allergen is injected under the first layer of your skin. ?Patch test. For this test, a small amount of allergen is placed on your skin. The area is covered and then checked after a few days. ?Blood tests. ?A challenge test. For this test, you will eat or breathe in a small amount of allergen to see if you have an allergic reaction. ?You may also be asked to: ?Keep a food diary. This is a record of all the foods, drinks, and symptoms you have in a day. ?Try an elimination diet. To do this: ?Remove certain foods from your diet. ?Add those foods back one by one to find out if any foods cause an allergic reaction. ?How is this treated? ? ?  ? ?Treatment for allergies depends on your symptoms. Treatment may include: ?Cold, wet cloths (cold compresses) to soothe itching and swelling. ?Eye drops or nasal sprays. ?Nasal irrigation to help clear your mucus or keep the nasal passages moist. ?A humidifier to add moisture to the air. ?Skin creams to treat rashes or itching. ?Oral antihistamines or other medicines to block the reaction or to treat inflammation. ?Diet changes to remove foods that cause allergies. ?Being  exposed again and again to tiny amounts of allergens to help you build a defense against it (tolerance). This is called immunotherapy. Examples include: ?Allergy shot. You receive an injection that contains an allergen. ?Sublingual  immunotherapy. You take a small dose of allergen under your tongue. ?Emergency injection for anaphylaxis. You give yourself a shot using a syringe (auto-injector) that contains the amount of medicine you need. Your health care provider will teach you how to give yourself an injection. ?Follow these instructions at home: ?Medicines ? ?Take or apply over-the-counter and prescription medicines only as told by your health care provider. ?Always carry your auto-injector pen if you are at risk of anaphylaxis. Give yourself an injection as told by your health care provider. ?Eating and drinking ?Follow instructions from your health care provider about eating or drinking restrictions. ?Drink enough fluid to keep your urine pale yellow. ?General instructions ?Wear a medical alert bracelet or necklace to let others know that you have had anaphylaxis before. ?Avoid known allergens whenever possible. ?Keep all follow-up visits as told by your health care provider. This is important. ?Contact a health care provider if: ?Your symptoms do not get better with treatment. ?Get help right away if: ?You have symptoms of anaphylaxis. These include: ?Swollen mouth, tongue, or throat. ?Pain or tightness in your chest. ?Trouble breathing or shortness of breath. ?Dizziness or fainting. ?Severe abdominal pain, vomiting, or diarrhea. ?These symptoms may represent a serious problem that is an emergency. Do not wait to see if the symptoms will go away. Get medical help right away. Call your local emergency services (911 in the [place] Do not drive yourself to the hospital. ?Summary ?Take or apply over-the-counter and prescription medicines only as told by your health care provider. ?Avoid known allergens when possible. ?Always carry your auto-injector pen if you are at risk of anaphylaxis. Give yourself an injection as told by your health care provider. ?Wear a medical alert bracelet or necklace to let others know that you have had anaphylaxis  before. ?Anaphylaxis is a life-threatening emergency. Get help right away. ?This information is not intended to replace advice given to you by your health care provider. Make sure you discuss any questions you have with your health care provider. ?Document Revised: 07/31/2020 Document Reviewed: 10/13/2019 ?Elsevier Patient Education ? [place]. ? ?Make sure you are taking a daily antihistamine -  ?Fexofenadine 180 mg daily ?Ceterizine 10 mg daily  ?Or loratadine 10 mg daily (more gentle) ? ?Wears a mask when mowing ?Rinse out eyes/face or irrigate nose with neti pot after coming inside ? ?In the evening do 1-2 sprays of fluticasone (flonase in each nostril) -  ?If really bad (congestion) can to twice daily in the short term ? ?Can add mucinex (guaifenacin) twice daily if needed for very thick chest mucus- take with plenty of water  ? ? ? ? ? ? ? ? ?

## 2022-04-12 ENCOUNTER — Other Ambulatory Visit: Payer: Self-pay | Admitting: Adult Health

## 2022-04-12 ENCOUNTER — Ambulatory Visit: Payer: PPO | Admitting: Nurse Practitioner

## 2022-04-12 LAB — COMPLETE METABOLIC PANEL WITH GFR
AG Ratio: 1.7 (calc) (ref 1.0–2.5)
ALT: 32 U/L (ref 9–46)
AST: 17 U/L (ref 10–35)
Albumin: 4.7 g/dL (ref 3.6–5.1)
Alkaline phosphatase (APISO): 47 U/L (ref 35–144)
BUN/Creatinine Ratio: 24 (calc) — ABNORMAL HIGH (ref 6–22)
BUN: 44 mg/dL — ABNORMAL HIGH (ref 7–25)
CO2: 31 mmol/L (ref 20–32)
Calcium: 11.4 mg/dL — ABNORMAL HIGH (ref 8.6–10.3)
Chloride: 105 mmol/L (ref 98–110)
Creat: 1.83 mg/dL — ABNORMAL HIGH (ref 0.70–1.35)
Globulin: 2.7 g/dL (calc) (ref 1.9–3.7)
Glucose, Bld: 127 mg/dL — ABNORMAL HIGH (ref 65–99)
Potassium: 4.8 mmol/L (ref 3.5–5.3)
Sodium: 146 mmol/L (ref 135–146)
Total Bilirubin: 0.4 mg/dL (ref 0.2–1.2)
Total Protein: 7.4 g/dL (ref 6.1–8.1)
eGFR: 40 mL/min/{1.73_m2} — ABNORMAL LOW (ref >=60)

## 2022-04-12 LAB — LIPID PANEL
Cholesterol: 131 mg/dL (ref <200)
HDL: 32 mg/dL — ABNORMAL LOW (ref >=40)
LDL Cholesterol (Calc): 64 mg/dL (calc)
Non-HDL Cholesterol (Calc): 99 mg/dL (calc) (ref <130)
Total CHOL/HDL Ratio: 4.1 (calc) (ref <5.0)
Triglycerides: 330 mg/dL — ABNORMAL HIGH (ref <150)

## 2022-04-12 LAB — CBC WITH DIFFERENTIAL/PLATELET
Absolute Monocytes: 611 cells/uL (ref 200–950)
Basophils Absolute: 43 cells/uL (ref 0–200)
Basophils Relative: 0.6 %
Eosinophils Absolute: 128 cells/uL (ref 15–500)
Eosinophils Relative: 1.8 %
HCT: 44.9 % (ref 38.5–50.0)
Hemoglobin: 14.7 g/dL (ref 13.2–17.1)
Lymphs Abs: 2272 cells/uL (ref 850–3900)
MCH: 30.6 pg (ref 27.0–33.0)
MCHC: 32.7 g/dL (ref 32.0–36.0)
MCV: 93.3 fL (ref 80.0–100.0)
MPV: 11.4 fL (ref 7.5–12.5)
Monocytes Relative: 8.6 %
Neutro Abs: 4047 cells/uL (ref 1500–7800)
Neutrophils Relative %: 57 %
Platelets: 221 10*3/uL (ref 140–400)
RBC: 4.81 10*6/uL (ref 4.20–5.80)
RDW: 12.6 % (ref 11.0–15.0)
Total Lymphocyte: 32 %
WBC: 7.1 10*3/uL (ref 3.8–10.8)

## 2022-04-12 LAB — HEMOGLOBIN A1C
Hgb A1c MFr Bld: 6.8 % of total Hgb — ABNORMAL HIGH (ref <5.7)
Mean Plasma Glucose: 148 mg/dL
eAG (mmol/L): 8.2 mmol/L

## 2022-04-12 LAB — TSH: TSH: 2.28 mIU/L (ref 0.40–4.50)

## 2022-04-12 LAB — MAGNESIUM: Magnesium: 2 mg/dL (ref 1.5–2.5)

## 2022-04-16 DIAGNOSIS — J069 Acute upper respiratory infection, unspecified: Secondary | ICD-10-CM | POA: Diagnosis not present

## 2022-04-16 DIAGNOSIS — B9689 Other specified bacterial agents as the cause of diseases classified elsewhere: Secondary | ICD-10-CM | POA: Diagnosis not present

## 2022-04-24 ENCOUNTER — Ambulatory Visit (INDEPENDENT_AMBULATORY_CARE_PROVIDER_SITE_OTHER): Payer: PPO

## 2022-04-24 NOTE — Progress Notes (Signed)
Patient presents to the office to have labs done to check Calcium levels. No questions or concerns. Vitals taken and recorded.  ?

## 2022-04-28 ENCOUNTER — Other Ambulatory Visit: Payer: Self-pay | Admitting: Internal Medicine

## 2022-04-28 DIAGNOSIS — E1122 Type 2 diabetes mellitus with diabetic chronic kidney disease: Secondary | ICD-10-CM

## 2022-04-28 DIAGNOSIS — E1121 Type 2 diabetes mellitus with diabetic nephropathy: Secondary | ICD-10-CM

## 2022-04-28 DIAGNOSIS — I10 Essential (primary) hypertension: Secondary | ICD-10-CM

## 2022-04-28 MED ORDER — TOPIRAMATE 25 MG PO TABS: ORAL_TABLET | ORAL | 3 refills | Status: DC

## 2022-04-28 MED ORDER — LOSARTAN POTASSIUM-HCTZ 100-25 MG PO TABS: ORAL_TABLET | ORAL | 3 refills | Status: DC

## 2022-04-29 LAB — PROTEIN ELECTROPHORESIS, SERUM, WITH REFLEX
Albumin ELP: 3.8 g/dL (ref 3.8–4.8)
Alpha 1: 0.3 g/dL (ref 0.2–0.3)
Alpha 2: 0.9 g/dL (ref 0.5–0.9)
Beta 2: 0.4 g/dL (ref 0.2–0.5)
Beta Globulin: 0.5 g/dL (ref 0.4–0.6)
Gamma Globulin: 0.8 g/dL (ref 0.8–1.7)
Total Protein: 6.8 g/dL (ref 6.1–8.1)

## 2022-04-29 LAB — KAPPA/LAMBDA LIGHT CHAINS
Kappa free light chain: 35.6 mg/L — ABNORMAL HIGH (ref 3.3–19.4)
Kappa:Lambda Ratio: 1.32 (ref 0.26–1.65)
Lambda Free Lght Chn: 26.9 mg/L — ABNORMAL HIGH (ref 5.7–26.3)

## 2022-04-29 LAB — PROTEIN,TOTAL AND PROTEIN ELECTROPHORESIS, RANDOM URINE(REFL)
Albumin: 41 %
Alpha-1-Globulin, U: 9 %
Alpha-2-Globulin, U: 21 %
Beta Globulin, U: 15 %
Creatinine, Urine: 72 mg/dL (ref 20–320)
Gamma Globulin, U: 14 %
Protein/Creat Ratio: 181 mg/g creat — ABNORMAL HIGH (ref 25–148)
Protein/Creatinine Ratio: 0.181 mg/mg creat — ABNORMAL HIGH (ref 0.025–0.148)
Total Protein, Urine: 13 mg/dL (ref 5–25)

## 2022-04-30 ENCOUNTER — Other Ambulatory Visit: Payer: Self-pay | Admitting: Adult Health

## 2022-04-30 DIAGNOSIS — R768 Other specified abnormal immunological findings in serum: Secondary | ICD-10-CM | POA: Insufficient documentation

## 2022-05-27 ENCOUNTER — Other Ambulatory Visit: Payer: Self-pay | Admitting: Nurse Practitioner

## 2022-05-29 ENCOUNTER — Inpatient Hospital Stay: Payer: PPO | Admitting: Oncology

## 2022-05-29 ENCOUNTER — Inpatient Hospital Stay: Payer: PPO

## 2022-06-05 ENCOUNTER — Inpatient Hospital Stay: Payer: PPO | Attending: Oncology

## 2022-06-05 ENCOUNTER — Other Ambulatory Visit: Payer: Self-pay | Admitting: *Deleted

## 2022-06-05 ENCOUNTER — Inpatient Hospital Stay: Payer: PPO | Admitting: Oncology

## 2022-06-05 ENCOUNTER — Other Ambulatory Visit: Payer: Self-pay

## 2022-06-05 VITALS — BP 148/74 | HR 83 | Temp 98.1°F | Resp 18 | Ht 69.0 in | Wt 258.3 lb

## 2022-06-05 DIAGNOSIS — R768 Other specified abnormal immunological findings in serum: Secondary | ICD-10-CM | POA: Diagnosis not present

## 2022-06-05 DIAGNOSIS — F17210 Nicotine dependence, cigarettes, uncomplicated: Secondary | ICD-10-CM | POA: Diagnosis not present

## 2022-06-05 DIAGNOSIS — R918 Other nonspecific abnormal finding of lung field: Secondary | ICD-10-CM | POA: Diagnosis not present

## 2022-06-05 DIAGNOSIS — E119 Type 2 diabetes mellitus without complications: Secondary | ICD-10-CM | POA: Diagnosis not present

## 2022-06-05 DIAGNOSIS — I10 Essential (primary) hypertension: Secondary | ICD-10-CM | POA: Diagnosis not present

## 2022-06-05 DIAGNOSIS — E785 Hyperlipidemia, unspecified: Secondary | ICD-10-CM | POA: Insufficient documentation

## 2022-06-05 DIAGNOSIS — Z86718 Personal history of other venous thrombosis and embolism: Secondary | ICD-10-CM | POA: Insufficient documentation

## 2022-06-05 LAB — CBC WITH DIFFERENTIAL (CANCER CENTER ONLY)
Abs Immature Granulocytes: 0.02 10*3/uL (ref 0.00–0.07)
Basophils Absolute: 0.1 10*3/uL (ref 0.0–0.1)
Basophils Relative: 1 %
Eosinophils Absolute: 0.1 10*3/uL (ref 0.0–0.5)
Eosinophils Relative: 2 %
HCT: 43.3 % (ref 39.0–52.0)
Hemoglobin: 13.8 g/dL (ref 13.0–17.0)
Immature Granulocytes: 0 %
Lymphocytes Relative: 27 %
Lymphs Abs: 2.1 10*3/uL (ref 0.7–4.0)
MCH: 29.6 pg (ref 26.0–34.0)
MCHC: 31.9 g/dL (ref 30.0–36.0)
MCV: 92.7 fL (ref 80.0–100.0)
Monocytes Absolute: 0.7 10*3/uL (ref 0.1–1.0)
Monocytes Relative: 10 %
Neutro Abs: 4.7 10*3/uL (ref 1.7–7.7)
Neutrophils Relative %: 60 %
Platelet Count: 245 10*3/uL (ref 150–400)
RBC: 4.67 MIL/uL (ref 4.22–5.81)
RDW: 14.2 % (ref 11.5–15.5)
WBC Count: 7.6 10*3/uL (ref 4.0–10.5)
nRBC: 0 % (ref 0.0–0.2)

## 2022-06-05 LAB — CMP (CANCER CENTER ONLY)
ALT: 28 U/L (ref 0–44)
AST: 18 U/L (ref 15–41)
Albumin: 4.8 g/dL (ref 3.5–5.0)
Alkaline Phosphatase: 37 U/L — ABNORMAL LOW (ref 38–126)
Anion gap: 10 (ref 5–15)
BUN: 33 mg/dL — ABNORMAL HIGH (ref 8–23)
CO2: 28 mmol/L (ref 22–32)
Calcium: 11.4 mg/dL — ABNORMAL HIGH (ref 8.9–10.3)
Chloride: 105 mmol/L (ref 98–111)
Creatinine: 1.43 mg/dL — ABNORMAL HIGH (ref 0.61–1.24)
GFR, Estimated: 54 mL/min — ABNORMAL LOW (ref >60)
Glucose, Bld: 115 mg/dL — ABNORMAL HIGH (ref 70–99)
Potassium: 3.7 mmol/L (ref 3.5–5.1)
Sodium: 143 mmol/L (ref 135–145)
Total Bilirubin: 0.3 mg/dL (ref 0.3–1.2)
Total Protein: 7.7 g/dL (ref 6.5–8.1)

## 2022-06-05 LAB — VITAMIN D 25 HYDROXY (VIT D DEFICIENCY, FRACTURES): Vit D, 25-Hydroxy: 85.4 ng/mL (ref 30–100)

## 2022-06-05 NOTE — Progress Notes (Signed)
[facility] New Patient Consult   Requesting MD: [name], [address] [place],  [place] [address]   Aaron Burns 66 y.o.  1956/11/18    Reason for Consult: Hypercalcemia   HPI: Aaron Burns reports feeling well.  A chemistry panel on 04/11/2022 was remarkable for an elevated creatinine at 1.83 and calcium of 11.4.  Review of the medical record reveals a mildly elevated calcium level for up to 5 years and mild elevation of the creatinine dating to 2021.  A CBC 04/11/2022 found the hemoglobin of 14.7, platelets 221,000, white count 7.1, and ANC 4.047.  A PTH related peptide returned at 13 08/30/2021.  The parathyroid hormone level returned at 18 on 12/04/2020.  He reports taking daily vitamin D therapy for greater than 10 years.    Additional laboratory evaluation on 04/24/2022 found the kappa and lambda serum free light chains to be mildly elevated.  A serum protein electrophoresis was normal.  A urine electrophoresis revealed no abnormal protein bands.  Past Medical History:  Diagnosis Date   Diabetes mellitus, type 2 (HCC)    Elevated hemoglobin A1c    Heart murmur    at birth   History of kidney stones 2011   Hypertension    Leg DVT (deep venous thromboembolism), chronic, left ([place]) 05/16/2020   Dr. Sherren Mocha Early evaluated 06/27/2020, advised d/c xarelto, no further treatment recommended for this chronic DVT.    Presumed provoked bilateral DVT following lumbar surgery in Oct 2020, follow up study in 05/13/2020 showed:  LEFT: - Findings consistent with chronic deep vein thrombosis involving the left peroneal veins. - No cystic structure found in the popliteal fossa. - Echogenic structures noted   Low back pain    Radiates to left ankle.  Injured after falling on back.   Morbid obesity (BMI 36. 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  History of a lung nodule   .  Hyperlipidemia   .  Gastroesophageal reflux disease  Past Surgical History:  Procedure Laterality Date   ABDOMINAL EXPOSURE N/A 10/07/2019   Procedure: ABDOMINAL EXPOSURE;  Surgeon: Rosetta Posner, MD;  Location: [facility];  Service: Vascular;  Laterality: N/A;   ANTERIOR LUMBAR FUSION N/A 10/07/2019   Procedure: Oblique lateral interbody fusion LUMBAR FOUR-FIVE, anterior lateral interbody fusion LUMBAR THREE-FOUR;  Surgeon: Melina Schools, MD;  Location: [facility];  Service: Orthopedics;  Laterality: N/A;  TAB BLOCK WITH EXPAREL   CHOLECYSTECTOMY  07/27/2012   Procedure: LAPAROSCOPIC CHOLECYSTECTOMY WITH INTRAOPERATIVE CHOLANGIOGRAM;  Surgeon: Joyice Faster. Cornett, MD;  Location: [facility];  Service: General;  Laterality: N/A;   COLONOSCOPY     CYSTOSCOPY/RETROGRADE/URETEROSCOPY/STONE EXTRACTION WITH BASKET  5/11   HERNIA REPAIR     as a baby   KNEE SURGERY Right 11/25/11 and 2015   arthroscopic, cortisone injections done also   TOTAL KNEE ARTHROPLASTY Right 06/06/2015   Procedure: RIGHT TOTAL KNEE ARTHROPLASTY;  Surgeon: Paralee Cancel, MD;  Location: [facility];  Service: Orthopedics;  Laterality: Right;   WISDOM TOOTH EXTRACTION  yrs ago    Medications: Reviewed  Allergies:  Allergies  Allergen Reactions   Ppd [Tuberculin Purified Protein Derivative]     Pt states he is not allergic to this.Marland Kitchenpositive tb test 2014, pt took tb treatment 2014  Family history: His father had to "brain tumors ".  Social History:   He lives with his wife in [place].  He is retired from a [employer].  He smokes 6 to 7 cigarettes/day for many years.  He reports social alcohol use.  No transfusion history.  No risk factor for HIV or hepatitis.  He has not received COVID-19 vaccines.  ROS:   Positives include: Intermittent reflux symptoms  A complete ROS was otherwise negative.  Physical Exam:  Blood pressure (!) 148/74, pulse 83,  temperature 98.1 F (36.7 C), temperature source Oral, resp. rate 18, height _0  (1.753 m), weight 258 lb 4.8 oz (117.2 kg), SpO2 96 %.  HEENT: Oropharynx without visible mass, neck without mass Lungs: Distant breath sounds, no respiratory distress Cardiac: Regular rate and rhythm Abdomen: No hepatosplenomegaly, nontender, no mass  Vascular: No leg edema Lymph nodes: No cervical, supraclavicular, axillary, or inguinal nodes Neurologic: Alert and oriented, motor exam appears intact in the upper and lower extremities bilaterally Skin: No rash Musculoskeletal: No spine tenderness   LAB:  CBC  Lab Results  Component Value Date   WBC 7.6 06/05/2022   HGB 13.8 06/05/2022   HCT 43.3 06/05/2022   MCV 92.7 06/05/2022   PLT 245 06/05/2022   NEUTROABS 4.7 06/05/2022        CMP  Lab Results  Component Value Date   NA 143 06/05/2022   K 3.7 06/05/2022   CL 105 06/05/2022   CO2 28 06/05/2022   GLUCOSE 115 (H) 06/05/2022   BUN 33 (H) 06/05/2022   CREATININE 1.43 (H) 06/05/2022   CALCIUM 11.4 (H) 06/05/2022   PROT 7.7 06/05/2022   ALBUMIN 4.8 06/05/2022   AST 18 06/05/2022   ALT 28 06/05/2022   ALKPHOS 37 (L) 06/05/2022   BILITOT 0.3 06/05/2022   GFRNONAA 54 (L) 06/05/2022   GFRAA 68 05/07/2021        Assessment/Plan:   Hypercalcemia-mild, chronic Normal PTH and PTHr levels Renal insufficiency Mild elevation of the serum free kappa and lambda light chains Diabetes History of TB Ongoing tobacco use History of lung nodules History of DVTs following spine surgery in 2020 Hypertension   Disposition:   Aaron Burns is referred for evaluation of hypercalcemia and mild elevation of the serum free kappa lambda light chains.  The hypercalcemia is chronic.  I have a low clinical suspicion for malignancy including multiple myeloma.  He is not anemic.  We will check quantitative immunoglobulin levels and urine/serum immunofixation to rule out a monoclonal protein.  I  suspect the hypercalcemia is related to polypharmacy (vitamin D or HCTZ) or less likely hyperparathyroidism.  I have a low clinical suspicion for hypercalcemia of malignancy.  We checked a vitamin D level today.  I recommend a referral to nephrology to evaluate the renal insufficiency (likely related to hypertension and diabetes) and hypercalcemia.  He is not scheduled for a follow-up appointment in the oncology clinic.  I will be available to see him in the future as needed.  I recommend he continue follow-up of the lung nodules and lung cancer screening via his primary provider.  Betsy Coder, MD  06/05/2022, 2:02 PM

## 2022-06-06 LAB — PTH, INTACT AND CALCIUM
Calcium, Total (PTH): 11 mg/dL — ABNORMAL HIGH (ref 8.6–10.2)
PTH: 13 pg/mL — ABNORMAL LOW (ref 15–65)

## 2022-06-07 ENCOUNTER — Telehealth: Payer: Self-pay

## 2022-06-07 LAB — IFE AND PE, RANDOM URINE
% BETA, Urine: 26.5 %
ALPHA 1 URINE: 6.3 %
Albumin, U: 39.2 %
Alpha 2, Urine: 16.1 %
GAMMA GLOBULIN URINE: 12 %
Total Protein, Urine: 10 mg/dL

## 2022-06-08 ENCOUNTER — Other Ambulatory Visit: Payer: Self-pay | Admitting: Internal Medicine

## 2022-06-10 LAB — MULTIPLE MYELOMA PANEL, SERUM
Albumin SerPl Elph-Mcnc: 4 g/dL (ref 2.9–4.4)
Albumin/Glob SerPl: 1.4 (ref 0.7–1.7)
Alpha 1: 0.2 g/dL (ref 0.0–0.4)
Alpha2 Glob SerPl Elph-Mcnc: 0.8 g/dL (ref 0.4–1.0)
B-Globulin SerPl Elph-Mcnc: 1.2 g/dL (ref 0.7–1.3)
Gamma Glob SerPl Elph-Mcnc: 0.8 g/dL (ref 0.4–1.8)
Globulin, Total: 3 g/dL (ref 2.2–3.9)
IgA: 186 mg/dL (ref 61–437)
IgG (Immunoglobin G), Serum: 797 mg/dL (ref 603–1613)
IgM (Immunoglobulin M), Srm: 81 mg/dL (ref 20–172)
Total Protein ELP: 7 g/dL (ref 6.0–8.5)

## 2022-06-12 ENCOUNTER — Other Ambulatory Visit: Payer: Self-pay | Admitting: Adult Health

## 2022-06-12 ENCOUNTER — Encounter: Payer: Self-pay | Admitting: Adult Health

## 2022-06-12 DIAGNOSIS — I10 Essential (primary) hypertension: Secondary | ICD-10-CM

## 2022-06-12 MED ORDER — OLMESARTAN MEDOXOMIL 40 MG PO TABS: ORAL_TABLET | ORAL | 3 refills | Status: DC

## 2022-06-13 NOTE — Telephone Encounter (Signed)
-----   Message from [name], NP sent at 06/12/2022  7:44 AM EDT ----- Regarding: BP med Please call and make sure he saw my mychart message about stopping losartan/HCTZ and start olmesartan INSTEAD for BP until next visit - thanks!

## 2022-06-13 NOTE — Telephone Encounter (Signed)
Left detailed message about seeing the message on MyChart.

## 2022-07-04 ENCOUNTER — Encounter: Payer: PPO | Admitting: Adult Health

## 2022-07-10 ENCOUNTER — Encounter: Payer: PPO | Admitting: Adult Health

## 2022-07-15 ENCOUNTER — Ambulatory Visit (INDEPENDENT_AMBULATORY_CARE_PROVIDER_SITE_OTHER): Payer: PPO | Admitting: Nurse Practitioner

## 2022-07-15 ENCOUNTER — Telehealth: Payer: Self-pay | Admitting: Nurse Practitioner

## 2022-07-15 ENCOUNTER — Encounter: Payer: Self-pay | Admitting: Nurse Practitioner

## 2022-07-15 VITALS — BP 138/76 | HR 85 | Temp 97.9°F | Ht 70.0 in | Wt 258.4 lb

## 2022-07-15 DIAGNOSIS — R238 Other skin changes: Secondary | ICD-10-CM

## 2022-07-15 DIAGNOSIS — Z1389 Encounter for screening for other disorder: Secondary | ICD-10-CM

## 2022-07-15 DIAGNOSIS — N1831 Chronic kidney disease, stage 3a: Secondary | ICD-10-CM | POA: Diagnosis not present

## 2022-07-15 DIAGNOSIS — E1122 Type 2 diabetes mellitus with diabetic chronic kidney disease: Secondary | ICD-10-CM | POA: Diagnosis not present

## 2022-07-15 DIAGNOSIS — Z0000 Encounter for general adult medical examination without abnormal findings: Secondary | ICD-10-CM

## 2022-07-15 DIAGNOSIS — Z87891 Personal history of nicotine dependence: Secondary | ICD-10-CM

## 2022-07-15 DIAGNOSIS — Z79899 Other long term (current) drug therapy: Secondary | ICD-10-CM | POA: Diagnosis not present

## 2022-07-15 DIAGNOSIS — I10 Essential (primary) hypertension: Secondary | ICD-10-CM

## 2022-07-15 DIAGNOSIS — I82502 Chronic embolism and thrombosis of unspecified deep veins of left lower extremity: Secondary | ICD-10-CM

## 2022-07-15 DIAGNOSIS — Z136 Encounter for screening for cardiovascular disorders: Secondary | ICD-10-CM | POA: Diagnosis not present

## 2022-07-15 DIAGNOSIS — Z125 Encounter for screening for malignant neoplasm of prostate: Secondary | ICD-10-CM | POA: Diagnosis not present

## 2022-07-15 DIAGNOSIS — E1121 Type 2 diabetes mellitus with diabetic nephropathy: Secondary | ICD-10-CM

## 2022-07-15 DIAGNOSIS — K219 Gastro-esophageal reflux disease without esophagitis: Secondary | ICD-10-CM

## 2022-07-15 DIAGNOSIS — E559 Vitamin D deficiency, unspecified: Secondary | ICD-10-CM

## 2022-07-15 DIAGNOSIS — Z0001 Encounter for general adult medical examination with abnormal findings: Secondary | ICD-10-CM

## 2022-07-15 DIAGNOSIS — R918 Other nonspecific abnormal finding of lung field: Secondary | ICD-10-CM

## 2022-07-15 DIAGNOSIS — Z9109 Other allergy status, other than to drugs and biological substances: Secondary | ICD-10-CM

## 2022-07-15 DIAGNOSIS — R160 Hepatomegaly, not elsewhere classified: Secondary | ICD-10-CM

## 2022-07-15 DIAGNOSIS — E1169 Type 2 diabetes mellitus with other specified complication: Secondary | ICD-10-CM

## 2022-07-15 DIAGNOSIS — K760 Fatty (change of) liver, not elsewhere classified: Secondary | ICD-10-CM

## 2022-07-15 MED ORDER — TRULICITY 3 MG/0.5ML ~~LOC~~ SOAJ: 3.0000 mg | SUBCUTANEOUS | 2 refills | Status: DC

## 2022-07-15 NOTE — Addendum Note (Signed)
Addended by: Chancy Hurter on: 07/15/2022 11:30 AM   Modules accepted: Orders

## 2022-07-15 NOTE — Progress Notes (Addendum)
CPE AND FOLLOW UP Assessment:   Diagnoses and all orders for this visit:  Encounter for Annual Physical Exam with abnormal findings Due annually  Health Maintenance reviewed Healthy lifestyle reviewed and goals set   Essential hypertension Has discontinued HCTZ and started Olmesartan Controlled  Continue medications; Discussed DASH (Dietary Approaches to Stop Hypertension) DASH diet is lower in sodium than a typical American diet. Cut back on foods that are high in saturated fat, cholesterol, and trans fats. Eat more whole-grain foods, fish, poultry, and nuts Remain active and exercise as tolerated daily.  Monitor BP at home-Call if greater than 130/80.    Leg DVT (deep venous thromboembolism), chronic, left (HCC) Chronic; vascular evaluated and advised stop elequis; monitor  Type 2 diabetes mellitus with stage 1 chronic kidney disease, without long-term current use of insulin ([place]) Discussed how what you eat and drink can aide in kidney protection. Stay well hydrated. Avoid high salt foods. Avoid NSAIDS. Keep BP and BG well controlled.   Take medications as prescribed. Remain active and exercise as tolerated daily. Maintain weight.  Continue to monitor.   Hyperlipidemia associated with type 2 diabetes mellitus ([place]) with BMI >35 Controlled Continue medications; Rosuvastatin Discussed lifestyle modifications. Recommended diet heavy in fruits and veggies, omega 3's. Decrease consumption of animal meats, cheeses, and dairy products. Remain active and exercise as tolerated. Continue to monitor.   CKD stage 3 due to type 2 diabetes mellitus ([place]) Discussed how what you eat and drink can aide in kidney protection. Stay well hydrated. Avoid high salt foods. Avoid NSAIDS. Keep BP and BG well controlled.   Take medications as prescribed. Remain active and exercise as tolerated daily. Maintain weight.  Continue to monitor.   Microalbuminuric diabetic nephropathy  (HCC) Increase fluids, avoid NSAIDS, monitor sugars, will monitor;  Improved on ACE/ARB   Gastroesophageal reflux disease, unspecified whether esophagitis present No suspected reflux complications ([place]/stricture). Lifestyle modification:  wt loss, avoid meals 2-3h before bedtime. Consider eliminating food triggers:  chocolate, caffeine, EtOH, acid/spicy food.   Fatty liver Weight loss advised, avoid alcohol/tylenol, low carb diet, will monitor LFTs Dr. Paulita Fujita following Check LFTs/CMP  Hepatomegaly Dr. Paulita Fujita following, avoid tylenol, alcohol, weight loss encouraged  Former heavy tobacco smoker (41 pack year, quit 2020) Continue annual screening; see nodule for follow up CT plans  Lung nodule Follow up CT 02/14/2022 was benign Resume 12 month follow up  Vitamin D deficiency At goal at recent check; continue to recommend supplementation for goal of 60-100 Check vitamin D annually and PRN  Class 2 Severe Obesity due to excess calories with serious comorbidity. Doing well with slow steady progress Long discussion about weight loss, diet, and exercise Encouraged to add some resistance exercises  Patient continues on topamax  Doing well with trulicity 1.5 mg will increase 3 mg Continue to monitor   Medication management All medications discussed and reviewed in full. All questions and concerns regarding medications addressed.     Environmental allergies No recent flare Add daily antihistamine PRN, avoid triggers, wear mask when mowing, rinse face/saline irrigations, flonase and mucinex PRN Follow up in office if progressive worsening   Hypercalcemia Check CMP/GFR  Screening for Hematuria or Proteinuria Check UA  Screening for prostate cancer Monitor PSA  Screening for ischemic heart disease. EKG performed  NSR Continue to monitor  Other skin changes Suggested referral to dermatology Education provided on benefits of skin cancer screening. Patient declines  at this time. Discussed importance of wearing sunscreen/sun hat Continue to monitor  Orders Placed This Encounter  Procedures   COMPLETE METABOLIC PANEL WITH GFR   Hemoglobin A1c   Urinalysis, Routine w reflex microscopic   PSA   EKG 12-Lead   Meds ordered this encounter  Medications   Dulaglutide (TRULICITY) 3 OL/0.7EM SOPN    Sig: Inject 3 mg as directed once a week.    Dispense:  2 mL    Refill:  2    Order Specific Question:   Supervising Provider    Answer:   Unk Pinto [[id number]]    Over 30 minutes of exam, counseling, chart review, and critical decision making was performed  Future Appointments  Date Time Provider [place]  10/15/2022  9:30 AM Darrol Jump, NP [facility] None  07/16/2023  9:00 AM Darrol Jump, NP [facility] None     Plan:   During the course of the visit the patient was educated and counseled about appropriate screening and preventive services including:   Pneumococcal vaccine  Influenza vaccine Prevnar 13 Td vaccine Screening electrocardiogram Colorectal cancer screening Diabetes screening Glaucoma screening Nutrition counseling    Subjective:  Aaron Burns is a 66 y.o. male who presents for CPE and 3 month follow up for HTN, hyperlipidemia, T2DM with diabetic nephropathy, and vitamin D Def.   He is married, 2 kids, no grandchildren. 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   Had diabetic retinopathy work up with Optometry Dr. Desma Mcgregor, [facility], and was negative.  Had elevated calcium level.  Consulted with Dr. Inda Merlin who though HCTZ to be a contributor.  This medication was discontinued and switched to Olmesartan.  Patient is tolerating well  and reports BP controlled average <130/90.  Workup for myeloma was negative. He did suggest that the very high calcium levels could be an unusual side effect from the hydrochlorothiazide.  Otherwise he suggested we refer you on to nephrology  (kidney specialist) for their input if levels remain elevated.  He has lumbar fusion in 09/2019 by Dr. Rolena Infante, does have some residual pain limited to lower back without radicular sx. Takes 2 tab regular tylenol but limited benefit. Was recommended weight loss. Had DVT complication after surgery -  Notably saw Dr. Sherren Mocha Early in Juy 2021 for left chronic DVT persistent despite xarelto x 6+ months, was advised to taper off, no further follow up was advised unless new sx.   I am concerned for an area on his bottom lip, left corner that is dark black in color.  Patient reports that "it has always been there, it is not skin cancer."  He denies any yearly skin checks with dermatology.  He does not wear daily sunscreen.  Hx of elevated LFTs; [place] 04/2020 showed hepatomegaly and diffuse hepatic steatosis, several small hyperecchoic masses within spleen, possible hemangiomas - he was referred to GI Dr. 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 Note recent CT 08/12/2021 shows progression of fatty liver and interval enlargement of liver. He has reduced alcohol, working on weight loss.   He is former smoker, quit in 2020, 40 pack year history. He had screening CT scan 07/23/2021 that showed concerning 9.2 mm nodule of RML recommended for 3 month follow up, CT was repeated 02/14/2022 with lung-RADS 2, 12 month follow up with low dose chest CT w/o contrast was recommended.     BMI is Body mass index is 37.08 kg/m., he has been working on diet and exercise. Prescribed phentermine/topamax with benefit when takes, no SE.  However, he is no longer taking Phentermine.  Continues with Topamax. He joined a Pharmacist, community. He has jointed silver sneakers but doesn't go too frequently, trying to increase, trying to get wife to join him. He does play golf.  Currently on Trulicity 1.5 mg.  He has not lost any weight over the last month. Wt Readings from Last 3 Encounters:   07/15/22 258 lb 6.4 oz (117.2 kg)  06/05/22 258 lb 4.8 oz (117.2 kg)  04/24/22 254 lb (115.2 kg)   His blood pressure has been controlled at home, today their BP is BP: 138/76  He does workout. He denies chest pain, shortness of breath, dizziness.    He is on cholesterol medication (rosuvastatin 5 mg and and fenofibrate 145 mg daily) denies myalgias. His cholesterol is not at goal. Reports rare alcohol. The cholesterol last visit was:   Lab Results  Component Value Date   CHOL 131 04/11/2022   HDL 32 (L) 04/11/2022   LDLCALC 64 04/11/2022   TRIG 330 (H) 04/11/2022   CHOLHDL 4.1 04/11/2022   He has been working on diet and exercise for T2 diabetes, on metformin 2000 mg daily, glipizide 5 mg BID, and denies hyperglycemia, hypoglycemia , increased appetite, nausea, polydipsia, polyuria and visual disturbances.  He reports fasting running higher - 150-160 He does have mild tingling in bil toes. Last A1C in the office was:  Lab Results  Component Value Date   HGBA1C 6.8 (H) 04/11/2022   He has CKD 1 associated with htn and T2DM monitored at this office. He is on ARB. Last GFR Lab Results  Component Value Date   GFRNONAA 54 (L) 06/05/2022   Patient is on Vitamin D supplement.   Lab Results  Component Value Date   VD25OH 85.40 06/05/2022    Denies LUTs. Last PSA was: Lab Results  Component Value Date   PSA 0.55 07/04/2021      Medication Review:  Current Outpatient Medications (Endocrine & Metabolic):    Dulaglutide (TRULICITY) 1.5 WN/4.6EV SOPN, Inject 1.5 mg into the skin once a week.   metFORMIN (GLUCOPHAGE-XR) 500 MG 24 hr tablet, TAKE TWO TABLETS BY MOUTH TWICE A DAY WITH A MEAL FOR DIABETES  Current Outpatient Medications (Cardiovascular):    fenofibrate (TRICOR) 145 MG tablet, TAKE ONE TABLET BY MOUTH DAILY   olmesartan (BENICAR) 40 MG tablet, Take 1 tab daily for blood pressure goal <130/80.   rosuvastatin (CRESTOR) 5 MG tablet, TAKE ONE TABLET BY MOUTH ONCE  WEEKLY IN THE EVENINGS ON SUNDAYS   Current Outpatient Medications (Analgesics):    acetaminophen (TYLENOL) 325 MG tablet, Take 2 tablets (650 mg total) by mouth every 4 (four) hours as needed for mild pain ((score 1 to 3) or temp > 100.5).   aspirin EC 81 MG tablet, Take 81 mg by mouth daily. Swallow whole.   Current Outpatient Medications (Other):    Blood Glucose Monitoring Suppl DEVI, Use to check blood sugar once daily   Cholecalciferol 125 MCG (5000 UT) capsule, Take 10,000 Units by mouth daily.    CINNAMON PO, Take 1,000 mg by mouth daily.   glucose blood (FREESTYLE LITE) test strip, Test blood sugar once daily   Lancets MISC, Test blood sugar once daily.   Magnesium 250 MG TABS, Take 1 tablet (250 mg total) by mouth 2 (two) times daily with a meal. (Patient taking differently: Take 400 mg by mouth every other day.)   Multiple Vitamin (MULTIVITAMIN WITH MINERALS) TABS, Take 1 tablet by mouth  daily.   tamsulosin (FLOMAX) 0.4 MG CAPS capsule, TAKE ONE CAPSULE BY MOUTH DAILY AS NEEDED FOR KIDNEY STONE   topiramate (TOPAMAX) 25 MG tablet, Take  1 to 2 tablets  at Night  for Dieting & Weight Loss   vitamin C (ASCORBIC ACID) 500 MG tablet, Take 1,000 mg by mouth daily.   zinc gluconate 50 MG tablet, Take 50 mg by mouth daily.  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]     Pt states he is not allergic to this.Marland Kitchenpositive tb test 2014, pt took tb treatment 2014    Current Problems (verified) has Hypertension; Type 2 diabetes mellitus ([place]); Vitamin D deficiency; Hyperlipidemia associated with type 2 diabetes mellitus ([place]); Medication management; Morbid obesity ([place]); FH: hypertension; Former heavy tobacco smoker (41 pack year, quit 2020); Fusion of lumbosacral spine; CKD stage 3 due to type 2 diabetes mellitus (HCC); LFT elevation; Gastroesophageal reflux disease; Microalbuminuric diabetic nephropathy ([place]); Fatty liver; Hepatomegaly; Lesion of  spleen; Leg DVT (deep venous thromboembolism), chronic, left ([place]); Hypercalcemia; Lung nodules; History of renal calculi; Kidney stones; Aortic atherosclerosis ([place]) -CT 07/2021; 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  Screening Tests Immunization History  Administered Date(s) Administered   Influenza Inj Mdck Quad With Preservative 10/10/2017   Influenza Split 12/01/2013   Influenza, High Dose Seasonal PF 10/04/2021   Influenza, Quadrivalent, Recombinant, Inj, Pf 08/14/2019   Influenza,inj,quad, With Preservative 08/23/2019   Influenza-Unspecified 10/02/2018, 12/11/2020   Pneumococcal Polysaccharide-23 10/10/2017   Td 12/17/2003   Tdap 02/14/2015    Preventative care: Last colonoscopy: 12/2018, Dr. Oletta Lamas, 2 benign polyps, 10 year follow up  Former smoker: has been declining CT, today agreeable to set up if low copay/insurance covers  Prior vaccinations: TD or Tdap: 2016  Influenza: 11/2021  Pneumococcal: 2018 Prevnar13: defer - plan on prevnar 20 once available  Shingrix: discuss with insurance  Covid 19: declines   Names of Other Physician/Practitioners you currently use: 1. [facility] here for primary care 2. Dr. Desma Mcgregor, eye doctor, last visit 02/2022 3. Dr. Joelyn Oms, [facility], dentist, last visit 2022, goes q59m got implants    Patient Care Team: MUnk Pinto MD as PCP - General (Internal Medicine)  Surgical: He  has a past surgical history that includes Hernia repair; Cystoscopy/retrograde/ureteroscopy/stone extraction with basket (5/11); Wisdom tooth extraction (yrs ago); Knee surgery (Right, 11/25/11 and 2015); Cholecystectomy (07/27/2012); Total knee arthroplasty (Right, 06/06/2015); Colonoscopy; Anterior lumbar fusion (N/A, 10/07/2019); and Abdominal exposure (N/A, 10/07/2019). Family His family history includes Diabetes in  his brother, daughter, father, mother, and sister; Hypertension in his mother; Kidney failure in his mother. Social history  He reports that he quit smoking about 2 years ago. His smoking use included cigarettes. He started smoking about 44 years ago. He has a 41.00 pack-year smoking history. He has never used smokeless tobacco. He reports current alcohol use. He reports that he does not use drugs.  Review of Systems  Constitutional:  Negative for malaise/fatigue and weight loss.  HENT:  Negative for hearing loss and tinnitus.   Eyes:  Negative for blurred vision and double vision.  Respiratory:  Negative for cough, sputum production, shortness of breath and wheezing.   Cardiovascular:  Negative for chest pain, palpitations, orthopnea, claudication, leg swelling and PND.  Gastrointestinal:  Negative for abdominal pain, blood in stool, constipation, diarrhea, heartburn, melena, nausea and vomiting.  Genitourinary:  Negative.   Musculoskeletal:  Positive for back pain (chronic, lumbar). Negative for falls, joint pain and myalgias.  Skin:  Negative for rash.  Neurological:  Positive for tingling (bil feet, mild, unchanged). Negative for dizziness, sensory change, weakness and headaches.  Endo/Heme/Allergies:  Negative for polydipsia.  Psychiatric/Behavioral: Negative.  Negative for depression, memory loss, substance abuse and suicidal ideas. The patient is not nervous/anxious and does not have insomnia.   All other systems reviewed and are negative.     Objective:   Today's Vitals   07/15/22 0902  BP: 138/76  Pulse: 85  Temp: 97.9 F (36.6 C)  SpO2: 97%  Weight: 258 lb 6.4 oz (117.2 kg)  Height: '5\' 10"'$  (1.778 m)    Body mass index is 37.08 kg/m.  General appearance: alert, no distress, WD/WN, morbidly obese male HEENT: normocephalic, sclerae anicteric, TMs pearly, nares patent, no discharge or erythema, pharynx normal Oral cavity: MMM, no lesions Neck: supple, no lymphadenopathy,  no thyromegaly, no masses Heart: RRR, normal S1, S2, no murmurs Lungs: CTA bilaterally, no wheezes, rhonchi, or rales Abdomen: +bs, soft obses, non tender, non distended, no masses, no hepatomegaly, no splenomegaly Musculoskeletal: nontender, no swelling, no obvious deformity Extremities: no edema, no cyanosis, no clubbing Pulses: 2+ symmetric, upper and lower extremities, normal cap refill Neurological: alert, oriented x 3, CN2-12 intact, strength normal upper extremities and lower extremities, sensation normal with monofilament throughout, DTRs 2+ throughout, no cerebellar signs, gait normal Psychiatric: normal affect, behavior normal, pleasant  GU: declines, no concerns Skin:  Lower lip, left corder with round flat dark brown/black colored macule.     EKG: NSR, IRBBB  Medicare Attestation I have personally reviewed: The patient's medical and social history Their use of alcohol, tobacco or illicit drugs Their current medications and supplements The patient's functional ability including ADLs,fall risks, home safety risks, cognitive, and hearing and visual impairment Diet and physical activities Evidence for depression or mood disorders  The patient's weight, height, BMI, and visual acuity have been recorded in the chart.  I have made referrals, counseling, and provided education to the patient based on review of the above and I have provided the patient with a written personalized care plan for preventive services.     Darrol Jump, NP   07/15/2022

## 2022-07-15 NOTE — Telephone Encounter (Signed)
Patient said that his Trulicity was sent to [facility] and was $45.00. He says he is supposed to get them mailed to him (like he did last time) and it was free.Marland KitchenMarland Kitchen

## 2022-07-15 NOTE — Patient Instructions (Signed)
Preventing Skin Cancer, Adult Skin cancer is the most common type of cancer. There are three main types: Squamous cell. Basal cell. Melanoma. Squamous cell and basal cell skin cancer are the most common types. Melanoma is the most dangerous type. Most skin cancers are caused by skin damage from exposure to ultraviolet (UV) light. UV light comes from the sun and from artificial tanning beds. Suntans and sunburns result from exposure to UV light. Skin cancer occurs in people of all skin colors. Skin cancer occurs most often in older people, but it is usually the result of damage done earlier in life. The tans and sunburns you get at any age can lead to skin cancer in the future. To help prevent this, you can take steps to protect yourself. What actions can I take to protect myself from skin cancer? Many people like to get a tan, especially in the summer or when on vacation. However, tan or burned skin is a sign of skin damage and increases your risk for skin cancer. To lower your risk: Avoid exposure to UV light  Try to stay out of the sun between 10 a.m. and 4 p.m. whenever possible. This is when the sun is at its strongest. Seek the shade during this time. Remember that you can also be exposed to UV rays on cloudy or hazy days. Nancy Fetter exposure can be risky year-round, not just in the summer. Do not use a sunlamp, tanning bed, or tanning booth to get a tan. If you really want a tan, use an artificial tanning lotion. Avoid getting sunburned. Sunburns are more common on bright sunny days, especially when you are in areas where the sun is reflected off water or snow. Use sunscreen and protective clothing  Always use sunscreen--either a cream, lotion, or spray--when you are out in the sun. Keep sunscreen handy, such as in your gym bag or in your car, so that you will have it when you need it. Use sunscreen with a sun protection factor (SPF) of 30 or higher. Make sure your sunscreen protects you from UVA  and UVB light. It should also be water-resistant. Use enough sunscreen to cover all exposed areas of your skin. Put it on 15-30 minutes before you go out. Reapply sunscreen every 2 hours or anytime you come out of the water. When you are out in the sun, wear a broad-brimmed hat, clothing that covers your arms and legs, and wraparound sunglasses. Protect your lips by wearing a lip balm or lip stick with an SPF of at least 30. Check your skin for changes Check your skin often from head to toe to look for any changes in the size, color, or shape of any moles or freckles. Check for any new moles or moles that bleed or become itchy. See your health care provider if you notice any changes. Ask your health care provider about a total skin check. Ask if it should be part of your yearly physical or if you need to see a skin specialist (dermatologist). Take other preventive measures  Avoid exposure to harmful chemicals, such as arsenic. You can do this by: Having your home's water tested for arsenic and other chemicals. Taking protective measures to avoid exposure to chemicals at work. Do not use any products that contain nicotine or tobacco. These products include cigarettes, chewing tobacco, and vaping devices, such as e-cigarettes. If you need help quitting, ask your health care provider. Keep your immune system healthy. Take steps, such as: Staying up to  date on all vaccines, including the human papillomavirus (HPV) vaccine. Eating at least 5 servings of fruits and vegetables every day. Why are these changes important? About 1 of every 5 people will get skin cancer. The best way to reduce your risk is to avoid skin damage from UV light. If you have teenagers in your house, they should know that just five bad sunburns as a teen could double their risk of skin cancer in the future. If you have younger children, always make sure to protect their skin from the sun. These changes can help reduce your risk of  skin cancer. They will also provide other health benefits, such as: Protecting your skin from the sun can help prevent painful sunburns, sun poisoning, and other skin damage and blemishes. This is especially important if: You have pale white skin, freckles, and red hair. You burn easily. Avoiding exposure to harmful chemicals can help prevent damage to other tissues in your body, such as your lungs, and prevent other types of cancer. Avoiding smoking tobacco can reduce your risk for other types of cancer and other health problems. Eating a healthy diet is good for your overall health. What can happen if changes are not made? If you do not make these changes, you will be at higher risk for skin cancer. If you develop skin cancer, the treatments could result in lost time from work and changes in your appearance from scars. The most dangerous type of skin cancer, melanoma, can be deadly if not found early. Where to find support For more support, talk to your health care provider or dermatologist. Where to find more information Learn more about skin cancer from: The Central: www.skincancer.org/prevention The Centers for Disease Control and Prevention: [employer]se The American Academy of Dermatology: http://jones-macias.info/ Summary Skin cancer is the most common type of cancer. Melanoma skin cancer can be deadly if not found early. Sunburns and tanning increase your risk for skin cancer. Protecting your skin from UV light is the best way to prevent skin cancer. This information is not intended to replace advice given to you by your health care provider. Make sure you discuss any questions you have with your health care provider. Document Revised: 05/29/2021 Document Reviewed: 05/29/2021 Elsevier Patient Education  [place].

## 2022-07-16 LAB — URINALYSIS, ROUTINE W REFLEX MICROSCOPIC
Bacteria, UA: NONE SEEN /HPF
Bilirubin Urine: NEGATIVE
Hyaline Cast: NONE SEEN /LPF
Ketones, ur: NEGATIVE
Leukocytes,Ua: NEGATIVE
Nitrite: NEGATIVE
Specific Gravity, Urine: 1.019 (ref 1.001–1.035)
Squamous Epithelial / HPF: NONE SEEN /HPF (ref <=5)
pH: 7 (ref 5.0–8.0)

## 2022-07-16 LAB — COMPLETE METABOLIC PANEL WITH GFR
AG Ratio: 1.7 (calc) (ref 1.0–2.5)
ALT: 35 U/L (ref 9–46)
AST: 19 U/L (ref 10–35)
Albumin: 4.5 g/dL (ref 3.6–5.1)
Alkaline phosphatase (APISO): 46 U/L (ref 35–144)
BUN/Creatinine Ratio: 16 (calc) (ref 6–22)
BUN: 25 mg/dL (ref 7–25)
CO2: 31 mmol/L (ref 20–32)
Calcium: 10.2 mg/dL (ref 8.6–10.3)
Chloride: 104 mmol/L (ref 98–110)
Creat: 1.57 mg/dL — ABNORMAL HIGH (ref 0.70–1.35)
Globulin: 2.6 g/dL (calc) (ref 1.9–3.7)
Glucose, Bld: 238 mg/dL — ABNORMAL HIGH (ref 65–99)
Potassium: 4.6 mmol/L (ref 3.5–5.3)
Sodium: 143 mmol/L (ref 135–146)
Total Bilirubin: 0.4 mg/dL (ref 0.2–1.2)
Total Protein: 7.1 g/dL (ref 6.1–8.1)
eGFR: 48 mL/min/{1.73_m2} — ABNORMAL LOW (ref >=60)

## 2022-07-16 LAB — MICROSCOPIC MESSAGE

## 2022-07-16 LAB — HEMOGLOBIN A1C
Hgb A1c MFr Bld: 6.7 % of total Hgb — ABNORMAL HIGH (ref <5.7)
Mean Plasma Glucose: 146 mg/dL
eAG (mmol/L): 8.1 mmol/L

## 2022-07-16 LAB — PSA: PSA: 0.46 ng/mL (ref <=4.00)

## 2022-07-17 ENCOUNTER — Encounter: Payer: Self-pay | Admitting: *Deleted

## 2022-07-17 NOTE — Progress Notes (Signed)
Received request for medical records from [place]. This was forwarded to [place] via email.

## 2022-07-18 ENCOUNTER — Other Ambulatory Visit: Payer: Self-pay

## 2022-07-18 MED ORDER — TAMSULOSIN HCL 0.4 MG PO CAPS: ORAL_CAPSULE | ORAL | 0 refills | Status: DC

## 2022-07-26 ENCOUNTER — Telehealth: Payer: Self-pay | Admitting: Nurse Practitioner

## 2022-07-26 NOTE — Telephone Encounter (Signed)
Patient states that he has 3 trulicity injections left and hasn't heard from his patient assitance through [facility]. He says he has tried to contact them but is unable to get anyone on the phone. Please advise.

## 2022-08-24 ENCOUNTER — Other Ambulatory Visit: Payer: Self-pay | Admitting: Nurse Practitioner

## 2022-08-26 ENCOUNTER — Other Ambulatory Visit: Payer: Self-pay

## 2022-08-26 MED ORDER — ROSUVASTATIN CALCIUM 5 MG PO TABS: ORAL_TABLET | ORAL | 3 refills | Status: DC

## 2022-08-27 ENCOUNTER — Other Ambulatory Visit: Payer: Self-pay | Admitting: Internal Medicine

## 2022-08-27 DIAGNOSIS — E1169 Type 2 diabetes mellitus with other specified complication: Secondary | ICD-10-CM

## 2022-08-27 MED ORDER — FENOFIBRATE 145 MG PO TABS
ORAL_TABLET | ORAL | 3 refills | Status: DC
Start: 2022-08-27 — End: 2023-08-06

## 2022-10-01 ENCOUNTER — Ambulatory Visit: Payer: PPO | Admitting: Adult Health

## 2022-10-09 ENCOUNTER — Ambulatory Visit: Payer: PPO | Admitting: Adult Health

## 2022-10-15 ENCOUNTER — Encounter: Payer: Self-pay | Admitting: Nurse Practitioner

## 2022-10-15 ENCOUNTER — Encounter: Payer: Self-pay | Admitting: Internal Medicine

## 2022-10-15 ENCOUNTER — Ambulatory Visit (INDEPENDENT_AMBULATORY_CARE_PROVIDER_SITE_OTHER): Payer: PPO | Admitting: Nurse Practitioner

## 2022-10-15 VITALS — BP 116/68 | HR 86 | Temp 97.7°F | Ht 70.0 in | Wt 248.2 lb

## 2022-10-15 DIAGNOSIS — K760 Fatty (change of) liver, not elsewhere classified: Secondary | ICD-10-CM | POA: Diagnosis not present

## 2022-10-15 DIAGNOSIS — R6889 Other general symptoms and signs: Secondary | ICD-10-CM

## 2022-10-15 DIAGNOSIS — Z87891 Personal history of nicotine dependence: Secondary | ICD-10-CM | POA: Diagnosis not present

## 2022-10-15 DIAGNOSIS — E559 Vitamin D deficiency, unspecified: Secondary | ICD-10-CM

## 2022-10-15 DIAGNOSIS — E785 Hyperlipidemia, unspecified: Secondary | ICD-10-CM | POA: Diagnosis not present

## 2022-10-15 DIAGNOSIS — I82502 Chronic embolism and thrombosis of unspecified deep veins of left lower extremity: Secondary | ICD-10-CM | POA: Diagnosis not present

## 2022-10-15 DIAGNOSIS — E1169 Type 2 diabetes mellitus with other specified complication: Secondary | ICD-10-CM | POA: Diagnosis not present

## 2022-10-15 DIAGNOSIS — Z0001 Encounter for general adult medical examination with abnormal findings: Secondary | ICD-10-CM

## 2022-10-15 DIAGNOSIS — N1831 Chronic kidney disease, stage 3a: Secondary | ICD-10-CM | POA: Diagnosis not present

## 2022-10-15 DIAGNOSIS — K219 Gastro-esophageal reflux disease without esophagitis: Secondary | ICD-10-CM | POA: Diagnosis not present

## 2022-10-15 DIAGNOSIS — N183 Chronic kidney disease, stage 3 unspecified: Secondary | ICD-10-CM | POA: Diagnosis not present

## 2022-10-15 DIAGNOSIS — Z79899 Other long term (current) drug therapy: Secondary | ICD-10-CM

## 2022-10-15 DIAGNOSIS — R918 Other nonspecific abnormal finding of lung field: Secondary | ICD-10-CM

## 2022-10-15 DIAGNOSIS — R160 Hepatomegaly, not elsewhere classified: Secondary | ICD-10-CM

## 2022-10-15 DIAGNOSIS — Z23 Encounter for immunization: Secondary | ICD-10-CM

## 2022-10-15 DIAGNOSIS — Z0000 Encounter for general adult medical examination without abnormal findings: Secondary | ICD-10-CM

## 2022-10-15 DIAGNOSIS — E1122 Type 2 diabetes mellitus with diabetic chronic kidney disease: Secondary | ICD-10-CM

## 2022-10-15 DIAGNOSIS — I10 Essential (primary) hypertension: Secondary | ICD-10-CM

## 2022-10-15 NOTE — Patient Instructions (Signed)

## 2022-10-15 NOTE — Progress Notes (Signed)
AWV AND FOLLOW UP Assessment:   Diagnoses and all orders for this visit:  Annual Medicare Wellness Visit Due annually  Health maintenance reviewed  Essential hypertension Discussed DASH (Dietary Approaches to Stop Hypertension) DASH diet is lower in sodium than a typical American diet. Cut back on foods that are high in saturated fat, cholesterol, and trans fats. Eat more whole-grain foods, fish, poultry, and nuts Remain active and exercise as tolerated daily.  Monitor BP at home-Call if greater than 130/80.  Check CMP/CBC   Leg DVT (deep venous thromboembolism), chronic, left (HCC) Chronic; vascular evaluated and advised stop elequis; monitor  Type 2 diabetes mellitus with stage 1 chronic kidney disease, without long-term current use of insulin [name] Education: Reviewed 'ABCs' of diabetes management  Discussed goals to be met and/or maintained include A1C (<7) Blood pressure (<130/80) Cholesterol (LDL <70) Continue Eye Exam yearly - completed 2023 Continue Dental Exam Q6 mo Discussed dietary recommendations Discussed Physical Activity recommendations Foot exam UTD Check A1C   Hyperlipidemia associated with type 2 diabetes mellitus ([place]) Discussed lifestyle modifications. Recommended diet heavy in fruits and veggies, omega 3's. Decrease consumption of animal meats, cheeses, and dairy products. Remain active and exercise as tolerated. Continue to monitor. Check lipids/TSH   CKD stage 1 due to type 2 diabetes mellitus (HCC) Continue ACE, bASA, statin Discussed how what you eat and drink can aide in kidney protection. Stay well hydrated. Avoid high salt foods. Avoid NSAIDS. Keep BP and BG well controlled.   Take medications as prescribed. Remain active and exercise as tolerated daily. Maintain weight.  Continue to monitor. Check CMP/GFR/Microablumin  Microalbuminuric diabetic nephropathy (HCC) Increase fluids, avoid NSAIDS, monitor sugars, will monitor; on  ACE/ARB   Gastroesophageal reflux disease, unspecified whether esophagitis present No suspected reflux complications ([place]/stricture). Lifestyle modification:  wt loss, avoid meals 2-3h before bedtime. Consider eliminating food triggers:  chocolate, caffeine, EtOH, acid/spicy food.  Fatty liver Weight loss advised, avoid alcohol/tylenol, low carb diet, will monitor LFTs Dr. Paulita Burns following  Hepatomegaly Dr. Paulita Burns following, avoid tylenol, alcohol, weight loss encouraged  Former heavy tobacco smoker (41 pack year, quit 2020) Continue annual screening; see nodule for follow up CT plans  Lung nodule Follow up CT  07/23/2021, Due - Defers today Some cost concerns, check with insurance prior to scheduling   Vitamin D deficiency At goal at recent check; continue to recommend supplementation for goal of 60-100 Check vitamin D annually and PRN  Morbid obesity ([place]) Discussed appropriate BMI Goal of losing 1 lb per month. Diet modification. Physical activity. Encouraged/praised to build confidence.   Medication management All medications discussed and reviewed in full. All questions and concerns regarding medications addressed.    Need for influenza vaccine High dose quadrivalent administered without complication today   Orders Placed This Encounter  Procedures   CBC with Differential/Platelet   COMPLETE METABOLIC PANEL WITH GFR   Hemoglobin A1c   Lipid panel     Notify office for further evaluation and treatment, questions or concerns if any reported s/s fail to improve.   The patient was advised to call back or seek an in-person evaluation if any symptoms worsen or if the condition fails to improve as anticipated.   Further disposition pending results of labs. Discussed med's effects and SE's.    I discussed the assessment and treatment plan with the patient. The patient was provided an opportunity to ask questions and all were answered. The patient agreed with the  plan and demonstrated an understanding of  the instructions.  Discussed med's effects and SE's. Screening labs and tests as requested with regular follow-up as recommended.  I provided 35 minutes of face-to-face time during this encounter including counseling, chart review, and critical decision making was preformed.   Future Appointments  Date Time Provider [place]  04/15/2023  9:00 AM Darrol Jump, NP [facility] None  07/16/2023  9:00 AM Darrol Jump, NP [facility] None     Plan:   During the course of the visit the patient was educated and counseled about appropriate screening and preventive services including:   Pneumococcal vaccine  Influenza vaccine Prevnar 13 Td vaccine Screening electrocardiogram Colorectal cancer screening Diabetes screening Glaucoma screening Nutrition counseling    Subjective:  Aaron Burns is a 66 y.o. male who presents for CPE and 3 month follow up for HTN, hyperlipidemia, T2DM with diabetic nephropathy, and vitamin D Def.   He is married, 2 kids, no grandchildren. 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 Overall he reports feeling well today.  He has lumbar fusion in 09/2019 by Dr. Rolena Burns, does have some residual pain limited to lower back without radicular sx. Takes 2 tab regular tylenol but limited benefit. Was recommended weight loss. Had DVT complication after surgery - Notably saw Dr. Sherren Burns Early in Juy 2021 for left chronic DVT persistent despite xarelto x 6+ months, was advised to taper off, no further follow up unless new sx.   Hx of elevated LFTs; [place] 04/2020 showed hepatomegaly and diffuse hepatic steatosis, several small hyperecchoic masses within spleen, possible hemangiomas - he was referred to GI Dr. 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 Note recent CT 08/12/2021 shows progression of fatty liver and interval enlargement of liver. He has  not recently followed up  He is former smoker, quit in 2020, 40 pack year history. He had screening CT scan 07/23/2021 that showed concerning 9.2 mm nodule of RML recommended for 3 month follow up.  Cost concerns.  He is checking with insurance.  BMI is Body mass index is 35.61 kg/m., he has been working on diet and exercise. He is down 10 lb in the last 3 mo. Wt Readings from Last 3 Encounters:  10/15/22 248 lb 3.2 oz (112.6 kg)  07/15/22 258 lb 6.4 oz (117.2 kg)  06/05/22 258 lb 4.8 oz (117.2 kg)   His blood pressure has been controlled at home, today their BP is BP: 116/68 He does workout. He denies chest pain, shortness of breath, dizziness.   He has aortic atherosclerosis and coronary calcifications per CT 07/23/2021.    He is on cholesterol medication (rosuvastatin 5 mg and and fenofibrate 145 mg daily) denies myalgias. His cholesterol is not at goal with elevated triglycerides. Reports rare alcohol. The cholesterol last visit was:   Lab Results  Component Value Date   CHOL 131 04/11/2022   HDL 32 (L) 04/11/2022   LDLCALC 64 04/11/2022   TRIG 330 (H) 04/11/2022   CHOLHDL 4.1 04/11/2022   He has been working on diet and exercise for T2 diabetes, on metformin 2000 mg daily, glipizide 5 mg BID, and denies hyperglycemia, hypoglycemia , increased appetite, nausea, polydipsia, polyuria and visual disturbances. He reports fasting running around 130-145 He does have mild tingling in bil toes but no recent worsening. Last A1C in the office was:  Lab Results  Component Value Date   HGBA1C 6.7 (H) 07/15/2022   He has CKD II/III associated with htn and T2DM monitored at this  office. He is on ARB. Last GFR Lab Results  Component Value Date   GFRNONAA 54 (L) 06/05/2022   GFRNONAA 59 (L) 05/07/2021   GFRNONAA 49 (L) 04/02/2021   Patient is on Vitamin D supplement.   Lab Results  Component Value Date   VD25OH 85.40 06/05/2022       Medication Review:  Current Outpatient  Medications (Endocrine & Metabolic):    Dulaglutide (TRULICITY) 3 YK/5.9DJ SOPN, Inject 3 mg as directed once a week.   metFORMIN (GLUCOPHAGE-XR) 500 MG 24 hr tablet, TAKE TWO TABLETS BY MOUTH TWICE A DAY WITH A MEAL FOR DIABETES  Current Outpatient Medications (Cardiovascular):    fenofibrate (TRICOR) 145 MG tablet, Take  1 tablet  Daily  for Triglycerides (Blood Fats)                                              /                         TAKE                           BY                          MOUTH   olmesartan (BENICAR) 40 MG tablet, Take 1 tab daily for blood pressure goal <130/80.   rosuvastatin (CRESTOR) 5 MG tablet, TAKE ONE TABLET BY MOUTH ONCE WEEKLY IN THE EVENINGS ON SUNDAYS   Current Outpatient Medications (Analgesics):    acetaminophen (TYLENOL) 325 MG tablet, Take 2 tablets (650 mg total) by mouth every 4 (four) hours as needed for mild pain ((score 1 to 3) or temp > 100.5).   aspirin EC 81 MG tablet, Take 81 mg by mouth daily. Swallow whole.   Current Outpatient Medications (Other):    Blood Glucose Monitoring Suppl DEVI, Use to check blood sugar once daily   Cholecalciferol 125 MCG (5000 UT) capsule, Take 10,000 Units by mouth daily.    CINNAMON PO, Take 1,000 mg by mouth daily.   glucose blood (FREESTYLE LITE) test strip, Test blood sugar once daily   Lancets MISC, Test blood sugar once daily.   Magnesium 250 MG TABS, Take 1 tablet (250 mg total) by mouth 2 (two) times daily with a meal. (Patient taking differently: Take 400 mg by mouth every other day.)   Multiple Vitamin (MULTIVITAMIN WITH MINERALS) TABS, Take 1 tablet by mouth daily.   tamsulosin (FLOMAX) 0.4 MG CAPS capsule, Take  1 capsule  Daily  if Needed for Kidney Stone                                             /                                        TAKE                            BY  MOUTH   topiramate (TOPAMAX) 25 MG tablet, Take  1 to 2 tablets  at Night  for Dieting & Weight Loss    vitamin C (ASCORBIC ACID) 500 MG tablet, Take 1,000 mg by mouth daily.   zinc gluconate 50 MG tablet, Take 50 mg by mouth daily.  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]     Pt states he is not allergic to this.Marland Kitchenpositive tb test 2014, pt took tb treatment 2014    Current Problems (verified) has Hypertension; Type 2 diabetes mellitus ([place]); Vitamin D deficiency; Hyperlipidemia associated with type 2 diabetes mellitus ([place]); Medication management; Morbid obesity ([place]); FH: hypertension; Former heavy tobacco smoker (41 pack year, quit 2020); Fusion of lumbosacral spine; CKD stage 3 due to type 2 diabetes mellitus (HCC); LFT elevation; Gastroesophageal reflux disease; Microalbuminuric diabetic nephropathy ([place]); Fatty liver; Hepatomegaly; Lesion of spleen; Leg DVT (deep venous thromboembolism), chronic, left ([place]); Hypercalcemia; Lung nodules; History of renal calculi; Kidney stones; Aortic atherosclerosis ([place]) -CT 07/2021; 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  Screening Tests Immunization History  Administered Date(s) Administered   Influenza Inj Mdck Quad With Preservative 10/10/2017   Influenza Split 12/01/2013   Influenza, High Dose Seasonal PF 10/04/2021   Influenza, Quadrivalent, Recombinant, Inj, Pf 08/14/2019   Influenza,inj,quad, With Preservative 08/23/2019   Influenza-Unspecified 10/02/2018, 12/11/2020   Pneumococcal Polysaccharide-23 10/10/2017   Td 12/17/2003   Tdap 02/14/2015    Preventative care: Last colonoscopy: 12/2018, Dr. Oletta Lamas, 2 benign polyps, 10 year follow up  Former smoker: 07/2021, nodule, schedule 3 month follow up - Lung CT due  Prior vaccinations: TD or Tdap: 2016  Influenza: 10/15/22 Pneumococcal: 2018 Prevnar13: defer - plan on prevnar 20 next year Shingrix: discuss with insurance  Covid 19: declines    Names of Other Physician/Practitioners you currently use: 1. [facility] here for primary care 2. Dr. Laban Emperor, eye doctor, last visit 02/2022 3. Dr. Joelyn Oms, [facility], dentist, last visit 2023, goes q43m got implants    Patient Care Team: MUnk Pinto MD as PCP - General (Internal Medicine)  Surgical: He  has a past surgical history that includes Hernia repair; Cystoscopy/retrograde/ureteroscopy/stone extraction with basket (5/11); Wisdom tooth extraction (yrs ago); Knee surgery (Right, 11/25/11 and 2015); Cholecystectomy (07/27/2012); Total knee arthroplasty (Right, 06/06/2015); Colonoscopy; Anterior lumbar fusion (N/A, 10/07/2019); and Abdominal exposure (N/A, 10/07/2019). Family His family history includes Diabetes in his brother, daughter, father, mother, and sister; Hypertension in his mother; Kidney failure in his mother. Social history  He reports that he quit smoking about 3 years ago. His smoking use included cigarettes. He started smoking about 44 years ago. He has a 41.00 pack-year smoking history. He has never used smokeless tobacco. He reports current alcohol use. He reports that he does not use drugs.  Review of Systems  Constitutional:  Negative for malaise/fatigue and weight loss.  HENT:  Negative for hearing loss and tinnitus.   Eyes:  Negative for blurred vision and double vision.  Respiratory:  Negative for cough, sputum production, shortness of breath and wheezing.   Cardiovascular:  Negative for chest pain, palpitations, orthopnea, claudication, leg swelling and PND.  Gastrointestinal:  Negative for abdominal pain, blood in stool, constipation, diarrhea, heartburn, melena, nausea and vomiting.  Genitourinary: Negative.   Musculoskeletal:  Positive for back pain (chronic, lumbar). Negative for falls, joint pain and myalgias.  Skin:  Negative for  rash.  Neurological:  Positive for tingling (bil feet, mild, unchanged). Negative  for dizziness, sensory change, weakness and headaches.  Endo/Heme/Allergies:  Negative for polydipsia.  Psychiatric/Behavioral: Negative.  Negative for depression, memory loss, substance abuse and suicidal ideas. The patient is not nervous/anxious and does not have insomnia.   All other systems reviewed and are negative.     Objective:   Today's Vitals   10/15/22 0934  BP: 116/68  Pulse: 86  Temp: 97.7 F (36.5 C)  SpO2: 95%  Weight: 248 lb 3.2 oz (112.6 kg)  Height: _0  (1.778 m)    Body mass index is 35.61 kg/m.  General appearance: alert, no distress, WD/WN, morbidly obese male HEENT: normocephalic, sclerae anicteric, TMs pearly, nares patent, no discharge or erythema, pharynx normal Oral cavity: MMM, no lesions Neck: supple, no lymphadenopathy, no thyromegaly, no masses Heart: RRR, normal S1, S2, no murmurs Lungs: CTA bilaterally, no wheezes, rhonchi, or rales Abdomen: +bs, soft obses, non tender, non distended, no masses, no hepatomegaly, no splenomegaly Musculoskeletal: nontender, no swelling, no obvious deformity Extremities: no edema, no cyanosis, no clubbing Pulses: 2+ symmetric, upper and lower extremities, normal cap refill Neurological: alert, oriented x 3, CN2-12 intact, strength normal upper extremities and lower extremities, sensation normal with monofilament throughout, DTRs 2+ throughout, no cerebellar signs, gait normal Psychiatric: normal affect, behavior normal, pleasant   Medicare Attestation I have personally reviewed: The patient's medical and social history Their use of alcohol, tobacco or illicit drugs Their current medications and supplements The patient's functional ability including ADLs,fall risks, home safety risks, cognitive, and hearing and visual impairment Diet and physical activities Evidence for depression or mood disorders  The patient's weight, height, BMI, and visual acuity have been recorded in the chart.  I have made referrals,  counseling, and provided education to the patient based on review of the above and I have provided the patient with a written personalized care plan for preventive services.     Darrol Jump, NP   10/15/2022

## 2022-10-16 LAB — COMPLETE METABOLIC PANEL WITH GFR
AG Ratio: 1.6 (calc) (ref 1.0–2.5)
ALT: 46 U/L (ref 9–46)
AST: 29 U/L (ref 10–35)
Albumin: 4.5 g/dL (ref 3.6–5.1)
Alkaline phosphatase (APISO): 45 U/L (ref 35–144)
BUN/Creatinine Ratio: 20 (calc) (ref 6–22)
BUN: 28 mg/dL — ABNORMAL HIGH (ref 7–25)
CO2: 29 mmol/L (ref 20–32)
Calcium: 11 mg/dL — ABNORMAL HIGH (ref 8.6–10.3)
Chloride: 104 mmol/L (ref 98–110)
Creat: 1.4 mg/dL — ABNORMAL HIGH (ref 0.70–1.35)
Globulin: 2.9 g/dL (calc) (ref 1.9–3.7)
Glucose, Bld: 242 mg/dL — ABNORMAL HIGH (ref 65–99)
Potassium: 5.1 mmol/L (ref 3.5–5.3)
Sodium: 142 mmol/L (ref 135–146)
Total Bilirubin: 0.4 mg/dL (ref 0.2–1.2)
Total Protein: 7.4 g/dL (ref 6.1–8.1)
eGFR: 55 mL/min/{1.73_m2} — ABNORMAL LOW (ref >=60)

## 2022-10-16 LAB — CBC WITH DIFFERENTIAL/PLATELET
Absolute Monocytes: 530 cells/uL (ref 200–950)
Basophils Absolute: 41 cells/uL (ref 0–200)
Basophils Relative: 0.6 %
Eosinophils Absolute: 122 cells/uL (ref 15–500)
Eosinophils Relative: 1.8 %
HCT: 43.9 % (ref 38.5–50.0)
Hemoglobin: 14.4 g/dL (ref 13.2–17.1)
Lymphs Abs: 1408 cells/uL (ref 850–3900)
MCH: 29.8 pg (ref 27.0–33.0)
MCHC: 32.8 g/dL (ref 32.0–36.0)
MCV: 90.7 fL (ref 80.0–100.0)
MPV: 11 fL (ref 7.5–12.5)
Monocytes Relative: 7.8 %
Neutro Abs: 4699 cells/uL (ref 1500–7800)
Neutrophils Relative %: 69.1 %
Platelets: 221 10*3/uL (ref 140–400)
RBC: 4.84 10*6/uL (ref 4.20–5.80)
RDW: 12.9 % (ref 11.0–15.0)
Total Lymphocyte: 20.7 %
WBC: 6.8 10*3/uL (ref 3.8–10.8)

## 2022-10-16 LAB — LIPID PANEL
Cholesterol: 132 mg/dL (ref <200)
HDL: 34 mg/dL — ABNORMAL LOW (ref >=40)
LDL Cholesterol (Calc): 66 mg/dL (calc)
Non-HDL Cholesterol (Calc): 98 mg/dL (calc) (ref <130)
Total CHOL/HDL Ratio: 3.9 (calc) (ref <5.0)
Triglycerides: 301 mg/dL — ABNORMAL HIGH (ref <150)

## 2022-10-16 LAB — HEMOGLOBIN A1C
Hgb A1c MFr Bld: 8.6 % of total Hgb — ABNORMAL HIGH (ref <5.7)
Mean Plasma Glucose: 200 mg/dL
eAG (mmol/L): 11.1 mmol/L

## 2022-10-23 ENCOUNTER — Other Ambulatory Visit: Payer: Self-pay | Admitting: Nurse Practitioner

## 2022-10-23 DIAGNOSIS — N1831 Chronic kidney disease, stage 3a: Secondary | ICD-10-CM

## 2022-11-21 ENCOUNTER — Other Ambulatory Visit: Payer: Self-pay

## 2022-11-21 MED ORDER — TAMSULOSIN HCL 0.4 MG PO CAPS: ORAL_CAPSULE | ORAL | 0 refills | Status: DC

## 2023-01-15 ENCOUNTER — Encounter: Payer: Self-pay | Admitting: Internal Medicine

## 2023-01-15 NOTE — Progress Notes (Signed)
Future Appointments  Date Time Provider Department  01/16/2023  9:30 AM Unk Pinto, MD [facility]  07/16/2023  9:00 AM Darrol Jump, NP [facility]    History of Present Illness:       This very nice 67 y.o. MWM presents for 3 month follow up with HTN, HLD, Pre-Diabetes and Vitamin D Deficiency. Chest CT scan in 2022 showed Coronary  calcifications & Aortis Atherosclerosis        Patient is treated for HTN  since  2008 & BP has been controlled at home. Today's BP is at goal -  134/72.  In 2008 , he had a negative Stress Myoview. Patient has had no complaints of any cardiac type chest pain, palpitations, dyspnea / orthopnea / PND, dizziness, claudication, or dependent edema.        Hyperlipidemia is controlled with diet & meds. Patient denies myalgias or other med SE's. Last Lipids were at goal except elevated Trig's  Lab Results  Component Value Date   CHOL 132 10/15/2022   HDL 34 (L) 10/15/2022   LDLCALC 66 10/15/2022   TRIG 301 (H) 10/15/2022   CHOLHDL 3.9 10/15/2022     Also, the patient has history of  Morbid Obesity ( BMI 36A+) and PreDiabetes in 2009 and T2_NIDDM (A1c 6.7%  /Dec 2016) with CKD3a (GFR 55)  . He  has had no symptoms of reactive hypoglycemia, diabetic polys, paresthesias or visual blurring.  Last A1c was not at goal :  Lab Results  Component Value Date   HGBA1C 8.6 (H) 10/15/2022                                                             Further, the patient also has history of Vitamin D Deficiency ("35" /2008) and supplements vitamin D . Last vitamin D was at goal :  Lab Results  Component Value Date   VD25OH 85.40 06/05/2022     Current Outpatient Medications on File Prior to Visit  Medication Sig   acetaminophen (TYLENOL) 325 MG tablet Take 2 tablets every 4 hours as needed for mild pain    aspirin EC 81 MG tablet Take  daily   Cholecalciferol 125 MCG (5000 UT) capsule Take 10,000 Units daily.    CINNAMON PO Take 1,000 mg daily.    fenofibrate (TRICOR) 145 MG tablet Take  1 tablet  Daily  for Triglycerides    Magnesium 250 MG TABS Take 400 mg by mouth every other day.   metFORMIN -XR  500 MG  TAKE TWO TABLETS TWICE A DAY   Multiple Vitamin  Take 1 tablet daily.   olmesartan (BENICAR) 40 MG tablet Take 1 tab daily    rosuvastatin (CRESTOR) 5 MG tablet TAKE ONE TABLET  WEEKLY ON SUNDAYS   tamsulosin (FLOMAX) 0.4 MG CAPS capsule Take one capsule daily as needed for kidney stones   topiramate (TOPAMAX) 25 MG tablet Take  1 to 2 tablets  at Night  for Dieting & Weight Loss   TRULICITY 3 JJ/8.8CZ SOPN INJECT 3 MG ONCE A WEEK   vitamin C (ASCORBIC ACID) 500 MG tablet Take 1,000 mg by mouth daily.   zinc =50 MG tablet Take 50 mg by mouth daily.     Allergies  Allergen Reactions   Ppd [Tuberculin  Purified Protein Derivative]     Pt states he is not allergic to this.Marland Kitchenpositive tb test 2014, pt took tb treatment 2014     PMHx:   Past Medical History:  Diagnosis Date   Diabetes mellitus, type 2 ([place])    Elevated hemoglobin A1c    Heart murmur    at birth   History of kidney stones 2011   Hypertension    Leg DVT (deep venous thromboembolism), chronic, left ([place]) 05/16/2020   Dr. Sherren Mocha Early evaluated 06/27/2020, advised d/c xarelto, no further treatment recommended for this chronic DVT.    Presumed provoked bilateral DVT following lumbar surgery in Oct 2020, follow up study in 05/13/2020 showed:  LEFT: - Findings consistent with chronic deep vein thrombosis involving the left peroneal veins. - No cystic structure found in the popliteal fossa. - Echogenic structures noted   Low back pain    Radiates to left ankle.  Injured after falling on back.   Morbid obesity (BMI 36. 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   took treatment for thru [facility]   Renal disorder    Tuberculosis    2014 dx, was treated with medications for about 4 months     Immunization History  Administered Date(s)  Administered   Influenza Inj Mdck Quad  10/10/2017   Influenza Split 12/01/2013   Influenza, High Dose S 10/04/2021, 10/15/2022   Influenza, Quadrivalent, Recombinant, 08/14/2019   Influenza,inj,quad 08/23/2019   Influenza 10/02/2018, 12/11/2020   Pneumococcal -23 10/10/2017   Td 12/17/2003   Tdap 02/14/2015     Past Surgical History:  Procedure Laterality Date   ABDOMINAL EXPOSURE N/A 10/07/2019   Procedure: ABDOMINAL EXPOSURE;  Surgeon: Rosetta Posner, MD;  Location: [facility];  Service: Vascular;  Laterality: N/A;   ANTERIOR LUMBAR FUSION N/A 10/07/2019   Procedure: Oblique lateral interbody fusion LUMBAR FOUR-FIVE, anterior lateral interbody fusion LUMBAR THREE-FOUR;  Surgeon: Melina Schools, MD;  Location: [facility];  Service: Orthopedics;  Laterality: N/A;  TAB BLOCK WITH EXPAREL   CHOLECYSTECTOMY  07/27/2012   Procedure: LAPAROSCOPIC CHOLECYSTECTOMY WITH INTRAOPERATIVE CHOLANGIOGRAM;  Surgeon: Joyice Faster. Cornett, MD;  Location: [facility];  Service: General;  Laterality: N/A;   COLONOSCOPY     CYSTOSCOPY/RETROGRADE/URETEROSCOPY/STONE EXTRACTION WITH BASKET  5/11   HERNIA REPAIR     as a baby   KNEE SURGERY Right 11/25/11 and 2015   arthroscopic, cortisone injections done also   TOTAL KNEE ARTHROPLASTY Right 06/06/2015   Procedure: RIGHT TOTAL KNEE ARTHROPLASTY;  Surgeon: Paralee Cancel, MD;  Location: [facility];  Service: Orthopedics;  Laterality: Right;   WISDOM TOOTH EXTRACTION  yrs ago     FHx:    Reviewed / unchanged   SHx:    Reviewed / unchanged    Systems Review:  Constitutional: Denies fever, chills, wt changes, headaches, insomnia, fatigue, night sweats, change in appetite. Eyes: Denies redness, blurred vision, diplopia, discharge, itchy, watery eyes.  ENT: Denies discharge, congestion, post nasal drip, epistaxis, sore throat, earache, hearing loss, dental pain, tinnitus, vertigo, sinus pain, snoring.  CV: Denies chest pain, palpitations, irregular heartbeat, syncope, dyspnea,  diaphoresis, orthopnea, PND, claudication or edema. Respiratory: denies cough, dyspnea, DOE, pleurisy, hoarseness, laryngitis, wheezing.  Gastrointestinal: Denies dysphagia, odynophagia, heartburn, reflux, water brash, abdominal pain or cramps, nausea, vomiting, bloating, diarrhea, constipation, hematemesis, melena, hematochezia  or hemorrhoids. Genitourinary: Denies dysuria, frequency, urgency, nocturia, hesitancy, discharge, hematuria or flank pain. Musculoskeletal: Denies arthralgias, myalgias, stiffness, jt. swelling, pain, limping or strain/sprain.  Skin: Denies  pruritus, rash, hives, warts, acne, eczema or change in skin lesion(s). Neuro: No weakness, tremor, incoordination, spasms, paresthesia or pain. Psychiatric: Denies confusion, memory loss or sensory loss. Endo: Denies change in weight, skin or hair change.  Heme/Lymph: No excessive bleeding, bruising or enlarged lymph nodes.   Physical Exam  BP 134/72   Pulse 77   Temp 98.1 F (36.7 C)   Resp 16   Ht '5\' 10"'$  (1.778 m)   Wt 243 lb 6.4 oz (110.4 kg)   SpO2 96%   BMI 34.92 kg/m   Appears  well nourished, well groomed  and in no distress.  Eyes: PERRLA, EOMs, conjunctiva no swelling or erythema. Sinuses: No frontal/maxillary tenderness ENT/Mouth: EAC's clear, TM's nl w/o erythema, bulging. Nares clear w/o erythema, swelling, exudates. Oropharynx clear without erythema or exudates. Oral hygiene is good. Tongue normal, non obstructing. Hearing intact.  Neck: Supple. Thyroid not palpable. Car 2+/2+ without bruits, nodes or JVD. Chest: Respirations nl with BS clear & equal w/o rales, rhonchi, wheezing or stridor.  Cor: Heart sounds normal w/ regular rate and rhythm without sig. murmurs, gallops, clicks or rubs. Peripheral pulses normal and equal  without edema.  Abdomen: Soft & bowel sounds normal. Non-tender w/o guarding, rebound, hernias, masses or organomegaly.  Lymphatics: Unremarkable.  Musculoskeletal: Full ROM all  peripheral extremities, joint stability, 5/5 strength and normal gait.  Skin: Warm, dry without exposed rashes, lesions or ecchymosis apparent.  Neuro: Cranial nerves intact, reflexes equal bilaterally. Sensory-motor testing grossly intact. Tendon reflexes grossly intact.  Pysch: Alert & oriented x 3.  Insight and judgement nl & appropriate. No ideations.   Assessment and Plan:  1. Essential hypertension  - Continue medication, monitor blood pressure at home.  - Continue DASH diet.  Reminder to go to the ER if any CP,  SOB, nausea, dizziness, severe HA, changes vision/speech.    - CBC with Differential/Platelet - COMPLETE METABOLIC PANEL WITH GFR - Magnesium - TSH  2. Hyperlipidemia associated with type 2 diabetes mellitus ([place])  - Continue diet/meds, exercise,& lifestyle modifications.  - Continue monitor periodic cholesterol/liver & renal functions     - Lipid panel - TSH  3. Type 2 diabetes mellitus with stage 3a chronic kidney  disease, without long-term current use of insulin ([facility])  - Continue diet, exercise  - Lifestyle modifications.  - Monitor appropriate labs    - Hemoglobin A1c - Insulin, random  4. Vitamin D deficiency  - Continue supplementation    - VITAMIN D 25 Hydroxy   5. Aortic atherosclerosis ([place]) -CT 07/2021  - Lipid panel  6. Medication management  - CBC with Differential/Platelet - COMPLETE METABOLIC PANEL WITH GFR - Magnesium - Lipid panel - TSH - Hemoglobin A1c - Insulin, random - VITAMIN D 25 Hydroxy           Discussed  regular exercise, BP monitoring, weight control to achieve/maintain BMI less than 25 and discussed med and SE's. Recommended labs to assess /monitor clinical status .  I discussed the assessment and treatment plan with the patient. The patient was provided an opportunity to ask questions and all were answered. The patient agreed with the plan and demonstrated an understanding of the instructions.  I provided over 30  minutes of exam, counseling, chart review and  complex critical decision making.        The patient was advised to call back or seek an in-person evaluation if the symptoms worsen or if the condition fails to improve as anticipated.  Kirtland Bouchard, MD

## 2023-01-15 NOTE — Patient Instructions (Signed)

## 2023-01-16 ENCOUNTER — Encounter: Payer: Self-pay | Admitting: Internal Medicine

## 2023-01-16 ENCOUNTER — Ambulatory Visit (INDEPENDENT_AMBULATORY_CARE_PROVIDER_SITE_OTHER): Payer: PPO | Admitting: Internal Medicine

## 2023-01-16 VITALS — BP 134/72 | HR 77 | Temp 98.1°F | Resp 16 | Ht 70.0 in | Wt 243.4 lb

## 2023-01-16 DIAGNOSIS — E1122 Type 2 diabetes mellitus with diabetic chronic kidney disease: Secondary | ICD-10-CM

## 2023-01-16 DIAGNOSIS — E1169 Type 2 diabetes mellitus with other specified complication: Secondary | ICD-10-CM | POA: Diagnosis not present

## 2023-01-16 DIAGNOSIS — E559 Vitamin D deficiency, unspecified: Secondary | ICD-10-CM | POA: Diagnosis not present

## 2023-01-16 DIAGNOSIS — I700 Atherosclerosis of aorta: Secondary | ICD-10-CM | POA: Diagnosis not present

## 2023-01-16 DIAGNOSIS — N1831 Chronic kidney disease, stage 3a: Secondary | ICD-10-CM

## 2023-01-16 DIAGNOSIS — I10 Essential (primary) hypertension: Secondary | ICD-10-CM | POA: Diagnosis not present

## 2023-01-16 DIAGNOSIS — E785 Hyperlipidemia, unspecified: Secondary | ICD-10-CM | POA: Diagnosis not present

## 2023-01-16 DIAGNOSIS — Z79899 Other long term (current) drug therapy: Secondary | ICD-10-CM | POA: Diagnosis not present

## 2023-01-17 LAB — COMPLETE METABOLIC PANEL WITH GFR
AG Ratio: 1.5 (calc) (ref 1.0–2.5)
ALT: 67 U/L — ABNORMAL HIGH (ref 9–46)
AST: 61 U/L — ABNORMAL HIGH (ref 10–35)
Albumin: 4.6 g/dL (ref 3.6–5.1)
Alkaline phosphatase (APISO): 43 U/L (ref 35–144)
BUN/Creatinine Ratio: 15 (calc) (ref 6–22)
BUN: 22 mg/dL (ref 7–25)
CO2: 29 mmol/L (ref 20–32)
Calcium: 11 mg/dL — ABNORMAL HIGH (ref 8.6–10.3)
Chloride: 105 mmol/L (ref 98–110)
Creat: 1.49 mg/dL — ABNORMAL HIGH (ref 0.70–1.35)
Globulin: 3 g/dL (calc) (ref 1.9–3.7)
Glucose, Bld: 177 mg/dL — ABNORMAL HIGH (ref 65–99)
Potassium: 4.8 mmol/L (ref 3.5–5.3)
Sodium: 143 mmol/L (ref 135–146)
Total Bilirubin: 0.4 mg/dL (ref 0.2–1.2)
Total Protein: 7.6 g/dL (ref 6.1–8.1)
eGFR: 51 mL/min/{1.73_m2} — ABNORMAL LOW (ref >=60)

## 2023-01-17 LAB — LIPID PANEL
Cholesterol: 143 mg/dL (ref <200)
HDL: 38 mg/dL — ABNORMAL LOW (ref >=40)
LDL Cholesterol (Calc): 66 mg/dL (calc)
Non-HDL Cholesterol (Calc): 105 mg/dL (calc) (ref <130)
Total CHOL/HDL Ratio: 3.8 (calc) (ref <5.0)
Triglycerides: 323 mg/dL — ABNORMAL HIGH (ref <150)

## 2023-01-17 LAB — CBC WITH DIFFERENTIAL/PLATELET
Absolute Monocytes: 629 cells/uL (ref 200–950)
Basophils Absolute: 59 cells/uL (ref 0–200)
Basophils Relative: 0.8 %
Eosinophils Absolute: 118 cells/uL (ref 15–500)
Eosinophils Relative: 1.6 %
HCT: 44.2 % (ref 38.5–50.0)
Hemoglobin: 14.5 g/dL (ref 13.2–17.1)
Lymphs Abs: 1894 cells/uL (ref 850–3900)
MCH: 29.8 pg (ref 27.0–33.0)
MCHC: 32.8 g/dL (ref 32.0–36.0)
MCV: 90.8 fL (ref 80.0–100.0)
MPV: 10.7 fL (ref 7.5–12.5)
Monocytes Relative: 8.5 %
Neutro Abs: 4699 cells/uL (ref 1500–7800)
Neutrophils Relative %: 63.5 %
Platelets: 238 10*3/uL (ref 140–400)
RBC: 4.87 10*6/uL (ref 4.20–5.80)
RDW: 12.6 % (ref 11.0–15.0)
Total Lymphocyte: 25.6 %
WBC: 7.4 10*3/uL (ref 3.8–10.8)

## 2023-01-17 LAB — HEMOGLOBIN A1C
Hgb A1c MFr Bld: 9.1 % of total Hgb — ABNORMAL HIGH (ref <5.7)
Mean Plasma Glucose: 214 mg/dL
eAG (mmol/L): 11.9 mmol/L

## 2023-01-17 LAB — INSULIN, RANDOM: Insulin: 26 u[IU]/mL — ABNORMAL HIGH

## 2023-01-17 LAB — VITAMIN D 25 HYDROXY (VIT D DEFICIENCY, FRACTURES): Vit D, 25-Hydroxy: 86 ng/mL (ref 30–100)

## 2023-01-17 LAB — MAGNESIUM: Magnesium: 1.6 mg/dL (ref 1.5–2.5)

## 2023-01-17 LAB — TSH: TSH: 3.17 mIU/L (ref 0.40–4.50)

## 2023-01-19 ENCOUNTER — Other Ambulatory Visit: Payer: Self-pay | Admitting: Internal Medicine

## 2023-01-19 DIAGNOSIS — E1129 Type 2 diabetes mellitus with other diabetic kidney complication: Secondary | ICD-10-CM

## 2023-01-19 MED ORDER — TRULICITY 4.5 MG/0.5ML ~~LOC~~ SOAJ: SUBCUTANEOUS | 3 refills | Status: DC

## 2023-01-19 NOTE — Progress Notes (Signed)
<><><><><><><><><><><><><><><><><><><><><><><><><><><><><><><><><> <><><><><><><><><><><><><><><><><><><><><><><><><><><><><><><><><> - Test results slightly outside the reference range are not unusual.  If there is anything important, I will review this with you,  otherwise it is considered normal test values.  If you have further questions,  please do not hesitate to contact me at the office or via My Chart.  <><><><><><><><><><><><><><><><><><><><><><><><><><><><><><><><><> <><><><><><><><><><><><><><><><><><><><><><><><><><><><><><><><><>  -  Glucose = 177 mg% - is too high   -  A1c also worse  - A1c up to 9.1%  - Way to high   - will  send Rx  to increase Trulicity to 4.5 Mg    - Please be sure taking your Metformin 2 tabs  2 x/day with meals      ( Or can take 1 tab 2 x/day with Bkfst & Lunch & 2 tabs w /Supper )  <><><><><><><><><><><><><><><><><><><><><><><><><><><><><><><><><> <><><><><><><><><><><><><><><><><><><><><><><><><><><><><><><><><>  - Diet is still EXTREMELY IMPORTANT  ! ! !  - You must make a better effort to control your overeating & Gluttony   All of your health problems ultimately trace back to   the Sins of Gluttony (the 2sd of the 7 Deadly Sins - Overeating)   - You must get on a better diet   - Please get Dr. Fara Olden Fuhrman's books "The End of Dieting" &                                                                       "The End of Diabetes" to add years to your life   - Being diabetic has a  300% increased risk for heart attack,                                                  stroke, cancer, and alzheimer- type vascular dementia.    It is very important that you work harder with diet by                                 avoiding all foods that are white except chicken,  fish & calliflower.  - Avoid white rice  (brown & wild rice is OK),   - Avoid white potatoes  (sweet potatoes in moderation is OK),   White bread or wheat bread or anything  made out of   white flour like bagels, donuts, rolls, buns, biscuits, cakes,  - pastries, cookies, pizza crust, and pasta (made from white flour & egg whites)   - vegetarian pasta or spinach or wheat pasta is OK.  - Multigrain breads like Arnold's, Pepperidge Farm or   multigrain sandwich thins or high fiber breads like                                                         [place] bread or "Dave's Killer" breads that are  4 to 5 grams fiber per slice !  are best.    Diet, exercise and weight loss can reverse and cure                                                                                   diabetes in the early stages.    - Diet, exercise and weight loss is very important in the   control and prevention of complications of diabetes which                                                                        affects every system in your body, ie.   - Brain - dementia/stroke,  - eyes - glaucoma/blindness,  - heart - heart attack/heart failure,  - kidneys - dialysis,  - stomach - gastric paralysis,  - intestines - malabsorption,  - nerves - severe painful neuritis,  - circulation - gangrene & loss of a leg(s)  - and finally  . . . . . . . . . . . . . . . . . .    - cancer and Alzheimers.  <><><><><><><><><><><><><><><><><><><><><><><><><><><><><><><><><> <><><><><><><><><><><><><><><><><><><><><><><><><><><><><><><><><>  - Kidney functions are still in Stage 3 a Diabetic Kidney Failure - And   if you don't do a better job with your eating ,   you'll end up on Dialysis like some many other Diabetics that                                                                       couldn't control their eating !  <><><><><><><><><><><><><><><><><><><><><><><><><><><><><><><><><> <><><><><><><><><><><><><><><><><><><><><><><><><><><><><><><><><>  -  Total Chol = 143   &   LDL = 66 - Both are Great , BUT    Triglycerides (  323    ) or fats in blood are too high                                                        ( which is common when sugars are high due to "Bad Diet "                (   Ideal or  Goal is less than 150  !  )    - Recommend avoid fried & greasy foods,  sweets / candy,   - Avoid white rice  (brown or wild rice or Quinoa is OK),   - Avoid white potatoes  (sweet potatoes are OK)   - Avoid anything made from white  flour  - bagels, doughnuts, rolls, buns, biscuits, white and   wheat breads, pizza crust and traditional  pasta made of white flour & egg white  - (vegetarian pasta or spinach or wheat pasta is OK).    - Multi-grain bread is OK - like multi-grain flat bread or  sandwich thins.   - Avoid alcohol in excess.   - Exercise is also important.  <><><><><><><><><><><><><><><><><><><><><><><><><><><><><><><><><> <><><><><><><><><><><><><><><><><><><><><><><><><><><><><><><><><>  -  Magnesium = 1.6  is very very Low- goal is betw 2.0 - 2.5,   - So.....  Recommend that you take  Magnesium 500 mg tablet 3 x/day with Meals  !  - also important to eat lots of  leafy green vegetables   - spinach - Kale - collards - greens - okra - asparagus  - broccoli - quinoa - squash - almonds   - black, red, white beans  -  peas - green beans <><><><><><><><><><><><><><><><><><><><><><><><><><><><><><><><><> <><><><><><><><><><><><><><><><><><><><><><><><><><><><><><><><><>  - Vit D = 86 - Excellent - Please keep dosage same  <><><><><><><><><><><><><><><><><><><><><><><><><><><><><><><><><> <><><><><><><><><><><><><><><><><><><><><><><><><><><><><><><><><>  - All Else - CBC - Electrolytes - Liver & Thyroid    - all  Normal / OK <><><><><><><><><><><><><><><><><><><><><><><><><><><><><><><><><> <><><><><><><><><><><><><><><><><><><><><><><><><><><><><><><><><

## 2023-02-24 ENCOUNTER — Other Ambulatory Visit: Payer: Self-pay | Admitting: Nurse Practitioner

## 2023-02-27 ENCOUNTER — Other Ambulatory Visit: Payer: Self-pay | Admitting: Internal Medicine

## 2023-02-27 DIAGNOSIS — E119 Type 2 diabetes mellitus without complications: Secondary | ICD-10-CM

## 2023-02-27 MED ORDER — METFORMIN HCL ER 500 MG PO TB24: ORAL_TABLET | ORAL | 3 refills | Status: DC

## 2023-04-15 ENCOUNTER — Ambulatory Visit: Payer: PPO | Admitting: Nurse Practitioner

## 2023-05-01 ENCOUNTER — Encounter: Payer: Self-pay | Admitting: Nurse Practitioner

## 2023-05-01 ENCOUNTER — Ambulatory Visit (INDEPENDENT_AMBULATORY_CARE_PROVIDER_SITE_OTHER): Payer: PPO | Admitting: Nurse Practitioner

## 2023-05-01 VITALS — BP 112/64 | HR 69 | Temp 97.8°F | Ht 70.0 in | Wt 240.0 lb

## 2023-05-01 DIAGNOSIS — I10 Essential (primary) hypertension: Secondary | ICD-10-CM

## 2023-05-01 DIAGNOSIS — E559 Vitamin D deficiency, unspecified: Secondary | ICD-10-CM | POA: Diagnosis not present

## 2023-05-01 DIAGNOSIS — E1121 Type 2 diabetes mellitus with diabetic nephropathy: Secondary | ICD-10-CM | POA: Diagnosis not present

## 2023-05-01 DIAGNOSIS — I82502 Chronic embolism and thrombosis of unspecified deep veins of left lower extremity: Secondary | ICD-10-CM | POA: Diagnosis not present

## 2023-05-01 DIAGNOSIS — Z79899 Other long term (current) drug therapy: Secondary | ICD-10-CM

## 2023-05-01 DIAGNOSIS — E785 Hyperlipidemia, unspecified: Secondary | ICD-10-CM

## 2023-05-01 DIAGNOSIS — R6889 Other general symptoms and signs: Secondary | ICD-10-CM

## 2023-05-01 DIAGNOSIS — Z0000 Encounter for general adult medical examination without abnormal findings: Secondary | ICD-10-CM

## 2023-05-01 DIAGNOSIS — Z720 Tobacco use: Secondary | ICD-10-CM | POA: Diagnosis not present

## 2023-05-01 DIAGNOSIS — E1122 Type 2 diabetes mellitus with diabetic chronic kidney disease: Secondary | ICD-10-CM | POA: Diagnosis not present

## 2023-05-01 DIAGNOSIS — R918 Other nonspecific abnormal finding of lung field: Secondary | ICD-10-CM

## 2023-05-01 DIAGNOSIS — K219 Gastro-esophageal reflux disease without esophagitis: Secondary | ICD-10-CM

## 2023-05-01 DIAGNOSIS — N1831 Chronic kidney disease, stage 3a: Secondary | ICD-10-CM | POA: Diagnosis not present

## 2023-05-01 DIAGNOSIS — K760 Fatty (change of) liver, not elsewhere classified: Secondary | ICD-10-CM | POA: Diagnosis not present

## 2023-05-01 DIAGNOSIS — N183 Chronic kidney disease, stage 3 unspecified: Secondary | ICD-10-CM | POA: Diagnosis not present

## 2023-05-01 DIAGNOSIS — E1169 Type 2 diabetes mellitus with other specified complication: Secondary | ICD-10-CM | POA: Diagnosis not present

## 2023-05-01 DIAGNOSIS — Z87891 Personal history of nicotine dependence: Secondary | ICD-10-CM

## 2023-05-01 DIAGNOSIS — Z0001 Encounter for general adult medical examination with abnormal findings: Secondary | ICD-10-CM | POA: Diagnosis not present

## 2023-05-01 DIAGNOSIS — R160 Hepatomegaly, not elsewhere classified: Secondary | ICD-10-CM

## 2023-05-01 MED ORDER — TAMSULOSIN HCL 0.4 MG PO CAPS: ORAL_CAPSULE | ORAL | 0 refills | Status: DC

## 2023-05-01 NOTE — Patient Instructions (Signed)

## 2023-05-01 NOTE — Progress Notes (Signed)
AWV AND FOLLOW UP Assessment:   Diagnoses and all orders for this visit:  Annual Medicare Wellness Visit Due annually  Health maintenance reviewed  Essential hypertension Discussed DASH (Dietary Approaches to Stop Hypertension) DASH diet is lower in sodium than a typical American diet. Cut back on foods that are high in saturated fat, cholesterol, and trans fats. Eat more whole-grain foods, fish, poultry, and nuts Remain active and exercise as tolerated daily.  Monitor BP at home-Call if greater than 130/80.  Check CMP/CBC  Leg DVT (deep venous thromboembolism), chronic, left (HCC) Chronic; vascular evaluated and advised stop elequis; monitor  Type 2 diabetes mellitus with stage 1 chronic kidney disease, without long-term current use of insulin [facility]) Education: Reviewed 'ABCs' of diabetes management  Discussed goals to be met and/or maintained include A1C (<7) Blood pressure (<130/80) Cholesterol (LDL <70) Continue Eye Exam yearly - completed 2023 Continue Dental Exam Q6 mo Discussed dietary recommendations Discussed Physical Activity recommendations Foot exam UTD Check A1C  Hyperlipidemia associated with type 2 diabetes mellitus (HCC) Discussed lifestyle modifications. Recommended diet heavy in fruits and veggies, omega 3's. Decrease consumption of animal meats, cheeses, and dairy products. Remain active and exercise as tolerated. Continue to monitor. Check lipids/TSH  CKD stage 1 due to type 2 diabetes mellitus (HCC) Discussed how what you eat and drink can aide in kidney protection. Stay well hydrated. Avoid high salt foods. Avoid NSAIDS. Keep BP and BG well controlled.   Take medications as prescribed. Remain active and exercise as tolerated daily. Maintain weight.  Continue to monitor. Check CMP/GFR/Microablumin  Microalbuminuric diabetic nephropathy (HCC) Increase fluids, avoid NSAIDS, monitor sugars, will monitor; on ACE/ARB   Gastroesophageal reflux  disease, unspecified whether esophagitis present No suspected reflux complications (Barret/stricture). Lifestyle modification:  wt loss, avoid meals 2-3h before bedtime. Consider eliminating food triggers:  chocolate, caffeine, EtOH, acid/spicy food.  Fatty liver Weight loss advised, avoid alcohol/tylenol, low carb diet, will monitor LFTs Dr. Dulce Sellar following  Hepatomegaly Dr. Dulce Sellar following, avoid tylenol, alcohol, weight loss encouraged  Former heavy tobacco smoker (41 pack year, quit 2020)/tobacco use Continue annual screening; see nodule for follow up CT plans  Lung nodule Follow up CT  07/23/2021, Due   Vitamin D deficiency At goal at recent check; continue to recommend supplementation for goal of 60-100  Morbid obesity (HCC) Discussed appropriate BMI Diet modification. Physical activity. Encouraged/praised to build confidence.  Medication management All medications discussed and reviewed in full. All questions and concerns regarding medications addressed.    Orders Placed This Encounter  Procedures   CT CHEST LUNG CA SCREEN LOW DOSE W/O CM    Standing Status:   Future    Standing Expiration Date:   04/30/2024    Order Specific Question:   Preferred Imaging Location?    Answer:   [id number]   CBC with Differential/Platelet   COMPLETE METABOLIC PANEL WITH GFR   Lipid panel   Hemoglobin A1c   Insulin, random   VITAMIN D 25 Hydroxy (Vit-D Deficiency, Fractures)    Notify office for further evaluation and treatment, questions or concerns if any reported s/s fail to improve.   The patient was advised to call back or seek an in-person evaluation if any symptoms worsen or if the condition fails to improve as anticipated.   Further disposition pending results of labs. Discussed med's effects and SE's.    I discussed the assessment and treatment plan with the patient. The patient was provided an opportunity to ask questions and  all were answered. The patient  agreed with the plan and demonstrated an understanding of the instructions.  Discussed med's effects and SE's. Screening labs and tests as requested with regular follow-up as recommended.  I provided 35 minutes of face-to-face time during this encounter including counseling, chart review, and critical decision making was preformed.  Today's Plan of Care is based on a patient-centered health care approach known as shared decision making - the decisions, tests and treatments allow for patient preferences and values to be balanced with clinical evidence.     Future Appointments  Date Time Provider Department Center  08/04/2023  9:30 AM Lucky Cowboy, MD [facility] None  11/04/2023  9:00 AM Adela Glimpse, NP [facility] None  02/06/2024  9:00 AM Adela Glimpse, NP [facility] None     Plan:   During the course of the visit the patient was educated and counseled about appropriate screening and preventive services including:   Pneumococcal vaccine  Influenza vaccine Prevnar 13 Td vaccine Screening electrocardiogram Colorectal cancer screening Diabetes screening Glaucoma screening Nutrition counseling    Subjective:  Aaron Burns is a 67 y.o. male who presents for CPE and 3 month follow up for HTN, hyperlipidemia, T2DM with diabetic nephropathy, and vitamin D Def.   He is married, 2 kids, no grandchildren. He is retired, formerly worked in [employer]. Overall he reports feeling well today.  He has lumbar fusion in 09/2019 by Dr. Shon Baton, does have some residual pain limited to lower back without radicular sx. Takes 2 tab regular tylenol but limited benefit. Was recommended weight loss. Had DVT complication after surgery - Notably saw Dr. Tawanna Cooler Early in Juy 2021 for left chronic DVT persistent despite xarelto x 6+ months, was advised to taper off, no further follow up unless new sx.   Hx of elevated LFTs; [place] 04/2020 showed hepatomegaly and diffuse hepatic steatosis,  several small hyperecchoic masses within spleen, possible hemangiomas - he was referred to GI Dr. 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 Note recent CT 08/12/2021 shows progression of fatty liver and interval enlargement of liver. He has not recently followed up  He is former smoker, quit in 2020, 40 pack year history. He had screening CT scan 07/23/2021 that showed concerning 9.2 mm nodule of RML recommended for 3 month follow up.  Cost concerns.  Has not followed up.  Shares today that he smokes on the weekends.    BMI is Body mass index is 34.44 kg/m., he has been working on diet and exercise.  Wt Readings from Last 3 Encounters:  05/01/23 240 lb (108.9 kg)  01/16/23 243 lb 6.4 oz (110.4 kg)  10/15/22 248 lb 3.2 oz (112.6 kg)   His blood pressure has been controlled at home, today their BP is BP: 112/64 He does workout. He denies chest pain, shortness of breath, dizziness.   He has aortic atherosclerosis and coronary calcifications per CT 07/23/2021.    He is on cholesterol medication (rosuvastatin 5 mg and and fenofibrate 145 mg daily) denies myalgias. His cholesterol is not at goal with elevated triglycerides. Reports rare alcohol. The cholesterol last visit was:   Lab Results  Component Value Date   CHOL 143 01/16/2023   HDL 38 (L) 01/16/2023   LDLCALC 66 01/16/2023   TRIG 323 (H) 01/16/2023   CHOLHDL 3.8 01/16/2023   He has been working on diet and exercise for T2 diabetes, on metformin 2000 mg daily, glipizide 5 mg BID, and denies  hyperglycemia, hypoglycemia , increased appetite, nausea, polydipsia, polyuria and visual disturbances. He reports fasting running around 130-145 He does have mild tingling in bil toes but no recent worsening. Last A1C in the office was:  Lab Results  Component Value Date   HGBA1C 9.1 (H) 01/16/2023   He has CKD II/III associated with htn and T2DM monitored at this office. He is on ARB.  Last GFR Lab Results  Component Value Date   GFRNONAA 54 (L) 06/05/2022   GFRNONAA 59 (L) 05/07/2021   GFRNONAA 49 (L) 04/02/2021   Patient is on Vitamin D supplement.   Lab Results  Component Value Date   VD25OH 86 01/16/2023       Medication Review:  Current Outpatient Medications (Endocrine & Metabolic):    Dulaglutide (TRULICITY) 4.5 MG/0.5ML SOPN, Inject 1 pen (4.5 mg) into Skin every 7 days for Diabetes  (Dx: e11.29 )   metFORMIN (GLUCOPHAGE-XR) 500 MG 24 hr tablet, Take 2 tablets 2 x /day with Meals for Diabetes                                                                  /                                                         TAKE                                       BY                                         MOUTH  Current Outpatient Medications (Cardiovascular):    fenofibrate (TRICOR) 145 MG tablet, Take  1 tablet  Daily  for Triglycerides (Blood Fats)                                              /                         TAKE                           BY                          MOUTH   olmesartan (BENICAR) 40 MG tablet, Take 1 tab daily for blood pressure goal <130/80.   rosuvastatin (CRESTOR) 5 MG tablet, TAKE ONE TABLET BY MOUTH ONCE WEEKLY IN THE EVENINGS ON SUNDAYS   Current Outpatient Medications (Analgesics):    acetaminophen (TYLENOL) 325 MG tablet, Take 2 tablets (650 mg total) by mouth every 4 (four) hours as needed for mild pain ((score 1 to 3) or temp > 100.5).  aspirin EC 81 MG tablet, Take 81 mg by mouth daily. Swallow whole.   Current Outpatient Medications (Other):    Blood Glucose Monitoring Suppl DEVI, Use to check blood sugar once daily   Cholecalciferol 125 MCG (5000 UT) capsule, Take 10,000 Units by mouth daily.    CINNAMON PO, Take 1,000 mg by mouth daily.   glucose blood (FREESTYLE LITE) test strip, Test blood sugar once daily   Lancets MISC, Test blood sugar once daily.   Magnesium 250 MG TABS, Take 1 tablet (250 mg total) by mouth 2  (two) times daily with a meal. (Patient taking differently: Take 400 mg by mouth every other day.)   Multiple Vitamin (MULTIVITAMIN WITH MINERALS) TABS, Take 1 tablet by mouth daily.   topiramate (TOPAMAX) 25 MG tablet, Take  1 to 2 tablets  at Night  for Dieting & Weight Loss   vitamin C (ASCORBIC ACID) 500 MG tablet, Take 1,000 mg by mouth daily.   zinc gluconate 50 MG tablet, Take 50 mg by mouth daily.   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]     Pt states he is not allergic to this.Marland Kitchenpositive tb test 2014, pt took tb treatment 2014    Current Problems (verified) has Hypertension; Type 2 diabetes mellitus (HCC); Vitamin D deficiency; Hyperlipidemia associated with type 2 diabetes mellitus (HCC); Medication management; Morbid obesity (HCC); FH: hypertension; Former heavy tobacco smoker (41 pack year, quit 2020); Fusion of lumbosacral spine; CKD stage 3 due to type 2 diabetes mellitus (HCC); LFT elevation; Gastroesophageal reflux disease; Microalbuminuric diabetic nephropathy (HCC); Fatty liver; Hepatomegaly; Lesion of spleen; Leg DVT (deep venous thromboembolism), chronic, left (HCC); Hypercalcemia; Lung nodules; History of renal calculi; Kidney stones; Aortic atherosclerosis (HCC) -CT 07/2021; 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  Screening Tests Immunization History  Administered Date(s) Administered   Influenza Inj Mdck Quad With Preservative 10/10/2017   Influenza Split 12/01/2013   Influenza, High Dose Seasonal PF 10/04/2021, 10/15/2022   Influenza, Quadrivalent, Recombinant, Inj, Pf 08/14/2019   Influenza,inj,quad, With Preservative 08/23/2019   Influenza-Unspecified 10/02/2018, 12/11/2020   Pneumococcal Polysaccharide-23 10/10/2017   Td  12/17/2003   Tdap 02/14/2015    Preventative care: Last colonoscopy: 12/2018, Dr. Randa Evens, 2 benign polyps, 10 year follow up  Former and re-current smoker: 07/2021, nodule, schedule 3 month follow up - Lung CT due - order placed   Prior vaccinations: TD or Tdap: 2016  Influenza: 10/15/22 Pneumococcal: 2018 Prevnar13: defer - plan on prevnar 20 next year Shingrix: discuss with insurance  Covid 19: declines   Names of Other Physician/Practitioners you currently use: 1. [facility] here for primary care 2. Dr. Martha Clan, eye doctor, last visit 02/2022 3. Dr. Marisue Humble, [facility], dentist, last visit 2023, goes q25m, got implants    Patient Care Team: Lucky Cowboy, MD as PCP - General (Internal Medicine)  Surgical: He  has a past surgical history that includes Hernia repair; Cystoscopy/retrograde/ureteroscopy/stone extraction with basket (5/11); Wisdom tooth extraction (yrs ago); Knee surgery (Right, 11/25/11 and 2015); Cholecystectomy (07/27/2012); Total knee arthroplasty (Right, 06/06/2015); Colonoscopy; Anterior lumbar fusion (N/A, 10/07/2019); and Abdominal exposure (N/A, 10/07/2019). Family His family history includes Diabetes in his brother, daughter, father, mother, and sister; Hypertension in his mother; Kidney failure in his mother. Social history  He reports that he quit smoking about 3 years ago. His smoking use included cigarettes. He started smoking about 45 years ago. He has a 41.00 pack-year smoking history. He has never used smokeless tobacco. He reports current alcohol use. He reports that he does not use drugs.  Review of Systems  Constitutional:  Negative for malaise/fatigue and weight loss.  HENT:  Negative for hearing loss and tinnitus.   Eyes:  Negative for blurred vision and double vision.  Respiratory:  Negative for cough, sputum production, shortness of breath and wheezing.   Cardiovascular:  Negative for chest pain,  palpitations, orthopnea, claudication, leg swelling and PND.  Gastrointestinal:  Negative for abdominal pain, blood in stool, constipation, diarrhea, heartburn, melena, nausea and vomiting.  Genitourinary: Negative.   Musculoskeletal:  Positive for back pain (chronic, lumbar). Negative for falls, joint pain and myalgias.  Skin:  Negative for rash.  Neurological:  Positive for tingling (bil feet, mild, unchanged). Negative for dizziness, sensory change, weakness and headaches.  Endo/Heme/Allergies:  Negative for polydipsia.  Psychiatric/Behavioral: Negative.  Negative for depression, memory loss, substance abuse and suicidal ideas. The patient is not nervous/anxious and does not have insomnia.   All other systems reviewed and are negative.     Objective:   Today's Vitals   05/01/23 0928  BP: 112/64  Pulse: 69  Temp: 97.8 F (36.6 C)  SpO2: 93%  Weight: 240 lb (108.9 kg)  Height: 5\' 10"  (1.778 m)    Body mass index is 34.44 kg/m.  General appearance: alert, no distress, WD/WN, morbidly obese male HEENT: normocephalic, sclerae anicteric, TMs pearly, nares patent, no discharge or erythema, pharynx normal Oral cavity: MMM, no lesions Neck: supple, no lymphadenopathy, no thyromegaly, no masses Heart: RRR, normal S1, S2, no murmurs Lungs: CTA bilaterally, no wheezes, rhonchi, or rales Abdomen: +bs, soft obses, non tender, non distended, no masses, no hepatomegaly, no splenomegaly Musculoskeletal: nontender, no swelling, no obvious deformity Extremities: no edema, no cyanosis, no clubbing Pulses: 2+ symmetric, upper and lower extremities, normal cap refill Neurological: alert, oriented x 3, CN2-12 intact, strength normal upper extremities and lower extremities, sensation normal with monofilament throughout, DTRs 2+ throughout, no cerebellar signs, gait normal Psychiatric: normal affect, behavior normal, pleasant   Medicare Attestation I have personally reviewed: The patient's  medical and social history Their use of alcohol, tobacco or illicit drugs Their current medications and supplements The patient's functional ability including ADLs,fall risks, home safety risks, cognitive, and hearing and visual impairment Diet and physical activities Evidence for depression or mood disorders  The patient's weight, height, BMI, and visual acuity have been recorded in the chart.  I have made referrals, counseling, and provided education to the patient based on review of the above and I have provided the patient with a written personalized care plan for preventive services.     Adela Glimpse, NP   05/01/2023

## 2023-05-02 LAB — VITAMIN D 25 HYDROXY (VIT D DEFICIENCY, FRACTURES): Vit D, 25-Hydroxy: 91 ng/mL (ref 30–100)

## 2023-05-02 LAB — COMPLETE METABOLIC PANEL WITH GFR
AG Ratio: 1.8 (calc) (ref 1.0–2.5)
ALT: 65 U/L — ABNORMAL HIGH (ref 9–46)
AST: 50 U/L — ABNORMAL HIGH (ref 10–35)
Albumin: 4.8 g/dL (ref 3.6–5.1)
Alkaline phosphatase (APISO): 47 U/L (ref 35–144)
BUN/Creatinine Ratio: 20 (calc) (ref 6–22)
BUN: 28 mg/dL — ABNORMAL HIGH (ref 7–25)
CO2: 28 mmol/L (ref 20–32)
Calcium: 11.1 mg/dL — ABNORMAL HIGH (ref 8.6–10.3)
Chloride: 105 mmol/L (ref 98–110)
Creat: 1.39 mg/dL — ABNORMAL HIGH (ref 0.70–1.35)
Globulin: 2.7 g/dL (calc) (ref 1.9–3.7)
Glucose, Bld: 146 mg/dL — ABNORMAL HIGH (ref 65–99)
Potassium: 5 mmol/L (ref 3.5–5.3)
Sodium: 143 mmol/L (ref 135–146)
Total Bilirubin: 0.4 mg/dL (ref 0.2–1.2)
Total Protein: 7.5 g/dL (ref 6.1–8.1)
eGFR: 55 mL/min/{1.73_m2} — ABNORMAL LOW (ref >=60)

## 2023-05-02 LAB — CBC WITH DIFFERENTIAL/PLATELET
Absolute Monocytes: 657 cells/uL (ref 200–950)
Basophils Absolute: 58 cells/uL (ref 0–200)
Basophils Relative: 0.8 %
Eosinophils Absolute: 88 cells/uL (ref 15–500)
Eosinophils Relative: 1.2 %
HCT: 45.3 % (ref 38.5–50.0)
Hemoglobin: 14.7 g/dL (ref 13.2–17.1)
Lymphs Abs: 1737 cells/uL (ref 850–3900)
MCH: 30 pg (ref 27.0–33.0)
MCHC: 32.5 g/dL (ref 32.0–36.0)
MCV: 92.4 fL (ref 80.0–100.0)
MPV: 10.6 fL (ref 7.5–12.5)
Monocytes Relative: 9 %
Neutro Abs: 4760 cells/uL (ref 1500–7800)
Neutrophils Relative %: 65.2 %
Platelets: 263 10*3/uL (ref 140–400)
RBC: 4.9 10*6/uL (ref 4.20–5.80)
RDW: 12.6 % (ref 11.0–15.0)
Total Lymphocyte: 23.8 %
WBC: 7.3 10*3/uL (ref 3.8–10.8)

## 2023-05-02 LAB — HEMOGLOBIN A1C
Hgb A1c MFr Bld: 8.2 % of total Hgb — ABNORMAL HIGH (ref <5.7)
Mean Plasma Glucose: 189 mg/dL
eAG (mmol/L): 10.4 mmol/L

## 2023-05-02 LAB — INSULIN, RANDOM: Insulin: 22.6 u[IU]/mL — ABNORMAL HIGH

## 2023-05-02 LAB — LIPID PANEL
Cholesterol: 132 mg/dL (ref <200)
HDL: 36 mg/dL — ABNORMAL LOW (ref >=40)
LDL Cholesterol (Calc): 57 mg/dL (calc)
Non-HDL Cholesterol (Calc): 96 mg/dL (calc) (ref <130)
Total CHOL/HDL Ratio: 3.7 (calc) (ref <5.0)
Triglycerides: 375 mg/dL — ABNORMAL HIGH (ref <150)

## 2023-05-07 DIAGNOSIS — H43812 Vitreous degeneration, left eye: Secondary | ICD-10-CM | POA: Diagnosis not present

## 2023-05-07 DIAGNOSIS — E119 Type 2 diabetes mellitus without complications: Secondary | ICD-10-CM | POA: Diagnosis not present

## 2023-05-07 DIAGNOSIS — D3131 Benign neoplasm of right choroid: Secondary | ICD-10-CM | POA: Diagnosis not present

## 2023-05-07 LAB — HM DIABETES EYE EXAM

## 2023-05-31 ENCOUNTER — Other Ambulatory Visit: Payer: Self-pay | Admitting: Nurse Practitioner

## 2023-05-31 DIAGNOSIS — Z79899 Other long term (current) drug therapy: Secondary | ICD-10-CM

## 2023-06-02 ENCOUNTER — Other Ambulatory Visit: Payer: Self-pay

## 2023-06-02 DIAGNOSIS — I10 Essential (primary) hypertension: Secondary | ICD-10-CM

## 2023-06-02 MED ORDER — OLMESARTAN MEDOXOMIL 40 MG PO TABS
ORAL_TABLET | ORAL | 3 refills | Status: DC
Start: 2023-06-02 — End: 2024-06-03

## 2023-06-05 ENCOUNTER — Ambulatory Visit
Admission: RE | Admit: 2023-06-05 | Discharge: 2023-06-05 | Disposition: A | Discharge status: Home / Self Care | Payer: PPO | Source: Ambulatory Visit | Attending: Nurse Practitioner | Admitting: Nurse Practitioner

## 2023-06-05 DIAGNOSIS — Z87891 Personal history of nicotine dependence: Secondary | ICD-10-CM | POA: Diagnosis not present

## 2023-06-05 DIAGNOSIS — Z720 Tobacco use: Secondary | ICD-10-CM

## 2023-06-15 ENCOUNTER — Other Ambulatory Visit: Payer: Self-pay

## 2023-06-15 ENCOUNTER — Emergency Department (HOSPITAL_BASED_OUTPATIENT_CLINIC_OR_DEPARTMENT_OTHER): Payer: PPO

## 2023-06-15 ENCOUNTER — Encounter (HOSPITAL_BASED_OUTPATIENT_CLINIC_OR_DEPARTMENT_OTHER): Payer: Self-pay

## 2023-06-15 ENCOUNTER — Emergency Department (HOSPITAL_BASED_OUTPATIENT_CLINIC_OR_DEPARTMENT_OTHER)
Admission: EM | Admit: 2023-06-15 | Discharge: 2023-06-15 | Disposition: A | Discharge status: Home / Self Care | Payer: PPO | Attending: Emergency Medicine | Admitting: Emergency Medicine

## 2023-06-15 DIAGNOSIS — Z79899 Other long term (current) drug therapy: Secondary | ICD-10-CM | POA: Insufficient documentation

## 2023-06-15 DIAGNOSIS — I10 Essential (primary) hypertension: Secondary | ICD-10-CM | POA: Insufficient documentation

## 2023-06-15 DIAGNOSIS — R109 Unspecified abdominal pain: Secondary | ICD-10-CM | POA: Diagnosis not present

## 2023-06-15 DIAGNOSIS — Z7984 Long term (current) use of oral hypoglycemic drugs: Secondary | ICD-10-CM | POA: Insufficient documentation

## 2023-06-15 DIAGNOSIS — E119 Type 2 diabetes mellitus without complications: Secondary | ICD-10-CM | POA: Diagnosis not present

## 2023-06-15 DIAGNOSIS — Z7982 Long term (current) use of aspirin: Secondary | ICD-10-CM | POA: Insufficient documentation

## 2023-06-15 DIAGNOSIS — N200 Calculus of kidney: Secondary | ICD-10-CM | POA: Diagnosis not present

## 2023-06-15 LAB — URINALYSIS, ROUTINE W REFLEX MICROSCOPIC
Bilirubin Urine: NEGATIVE
Glucose, UA: NEGATIVE mg/dL
Hgb urine dipstick: NEGATIVE
Ketones, ur: NEGATIVE mg/dL
Nitrite: NEGATIVE
Specific Gravity, Urine: 1.022 (ref 1.005–1.030)
pH: 5.5 (ref 5.0–8.0)

## 2023-06-15 LAB — CBC
HCT: 42.8 % (ref 39.0–52.0)
Hemoglobin: 14.1 g/dL (ref 13.0–17.0)
MCH: 30.3 pg (ref 26.0–34.0)
MCHC: 32.9 g/dL (ref 30.0–36.0)
MCV: 92 fL (ref 80.0–100.0)
Platelets: 216 10*3/uL (ref 150–400)
RBC: 4.65 MIL/uL (ref 4.22–5.81)
RDW: 13.6 % (ref 11.5–15.5)
WBC: 8.4 10*3/uL (ref 4.0–10.5)
nRBC: 0 % (ref 0.0–0.2)

## 2023-06-15 LAB — COMPREHENSIVE METABOLIC PANEL
ALT: 48 U/L — ABNORMAL HIGH (ref 0–44)
AST: 29 U/L (ref 15–41)
Albumin: 4.4 g/dL (ref 3.5–5.0)
Alkaline Phosphatase: 38 U/L (ref 38–126)
Anion gap: 9 (ref 5–15)
BUN: 22 mg/dL (ref 8–23)
CO2: 24 mmol/L (ref 22–32)
Calcium: 11 mg/dL — ABNORMAL HIGH (ref 8.9–10.3)
Chloride: 108 mmol/L (ref 98–111)
Creatinine, Ser: 1.41 mg/dL — ABNORMAL HIGH (ref 0.61–1.24)
GFR, Estimated: 54 mL/min — ABNORMAL LOW (ref >60)
Glucose, Bld: 116 mg/dL — ABNORMAL HIGH (ref 70–99)
Potassium: 4.5 mmol/L (ref 3.5–5.1)
Sodium: 141 mmol/L (ref 135–145)
Total Bilirubin: 0.3 mg/dL (ref 0.3–1.2)
Total Protein: 7.2 g/dL (ref 6.5–8.1)

## 2023-06-15 IMAGING — CT CT CHEST LUNG CANCER SCREENING LOW DOSE W/O CM
1 series · 15 of 31 positions shown, 19 images · non-contrast
Comparison: None

CLINICAL DATA: Lung cancer screening. Forty pack-year history.
Former asymptomatic smoker.

EXAM:
CT CHEST WITHOUT CONTRAST LOW-DOSE FOR LUNG CANCER SCREENING
TECHNIQUE: Multidetector CT imaging of the chest was performed following the
standard protocol without IV contrast.

[Series 2: ldct screen -soft · axial · 0.88mm/px · z∈[-303,-23]mm · 15 of 62 slices shown, 19 images]
[im 3/62  mediastinal]
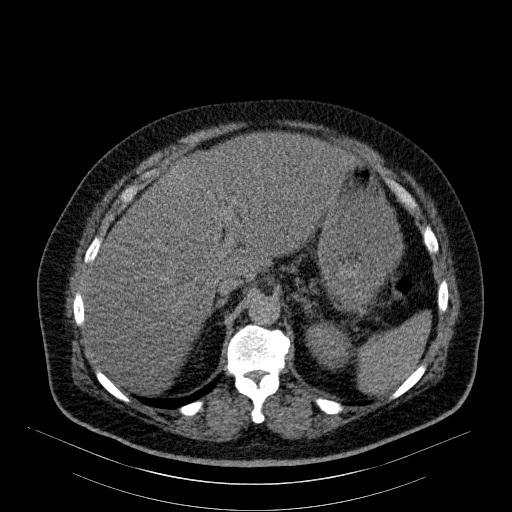
[im 3/62  lung]
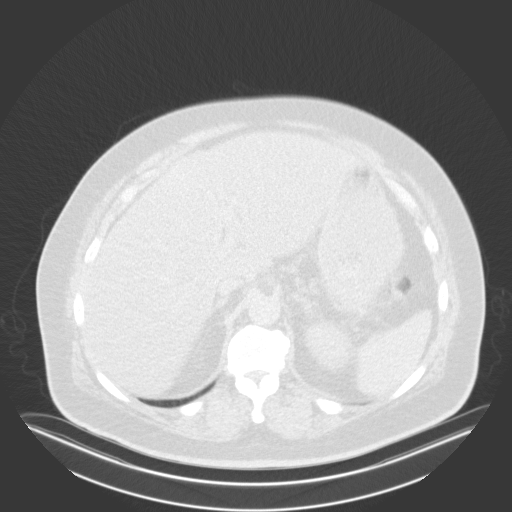
[im 7/62  lung]
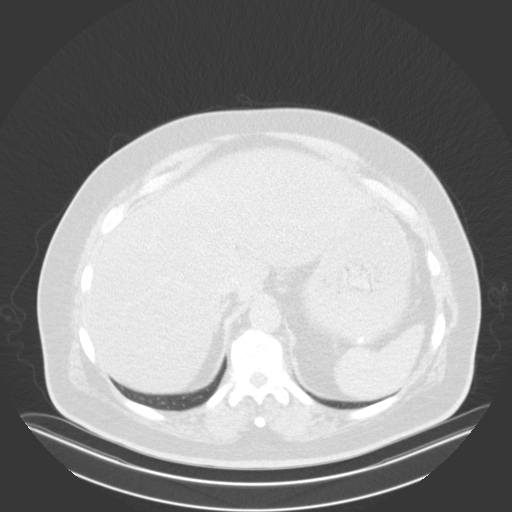
[im 12/62  lung]
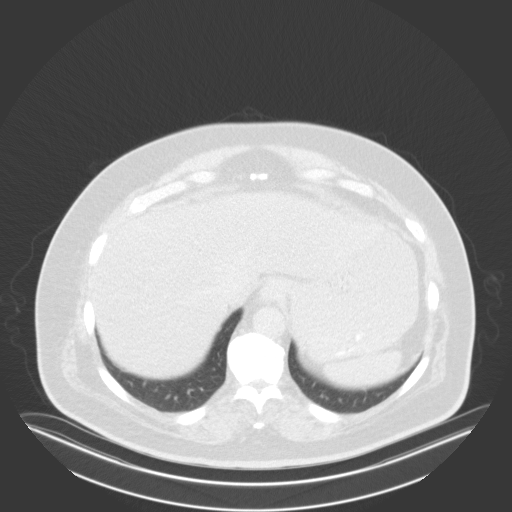
[im 14/62  lung]
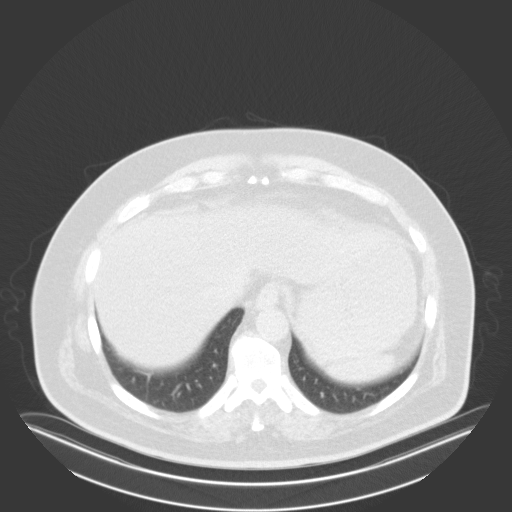
[im 19/62  mediastinal]
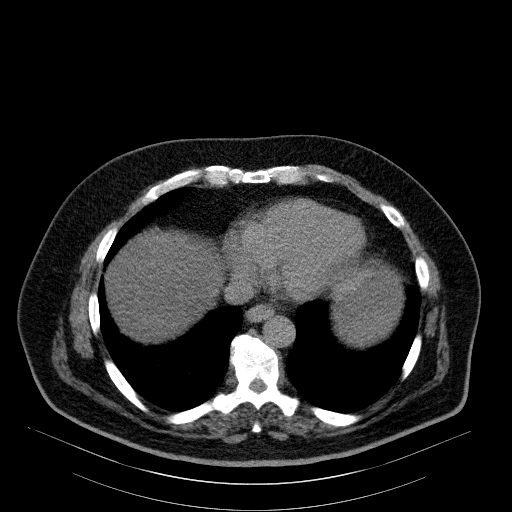
[im 19/62  lung]
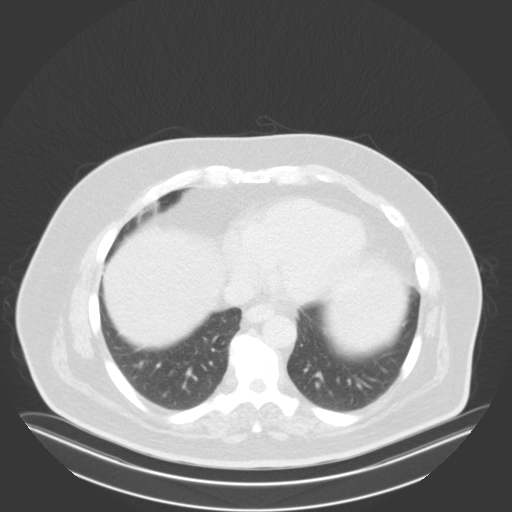
[im 23/62  lung]
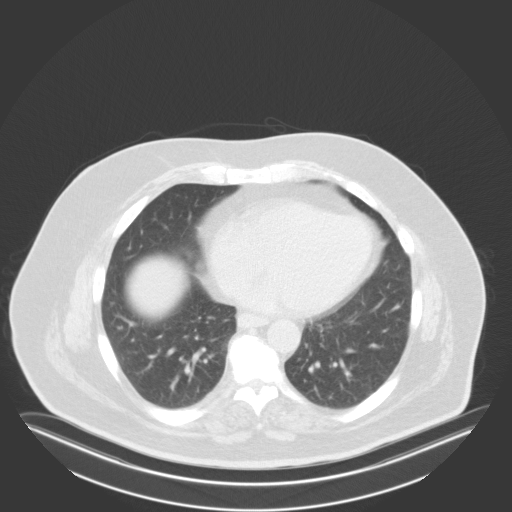
[im 28/62  lung]
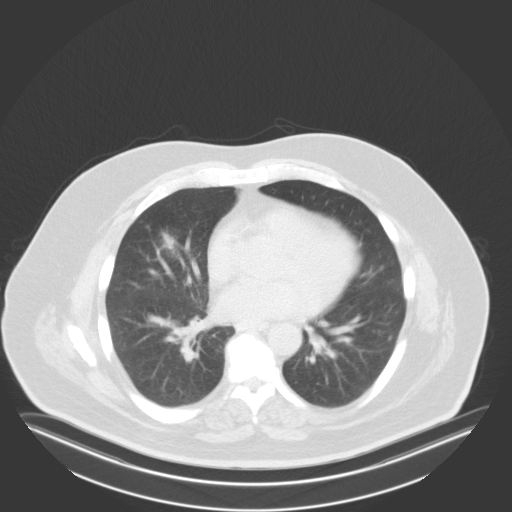
[im 32/62  lung]
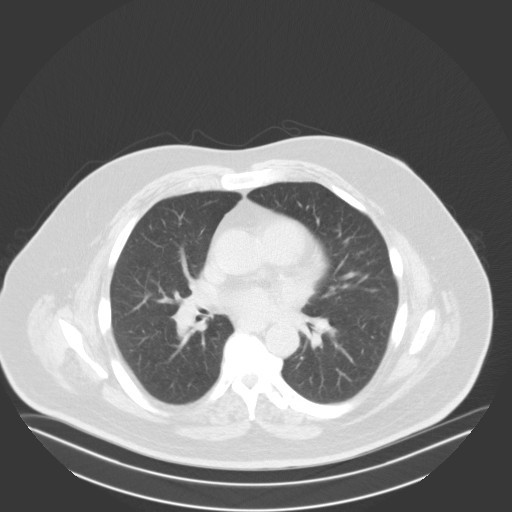
[im 34/62  mediastinal]
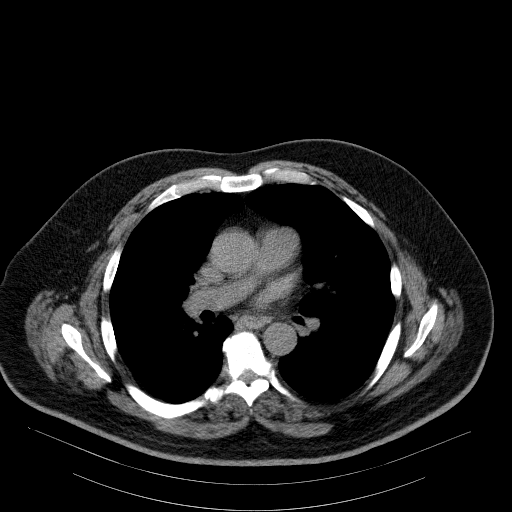
[im 34/62  lung]
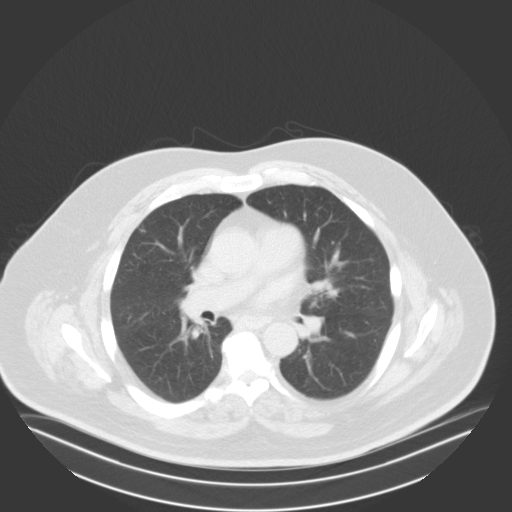
[im 39/62  lung]
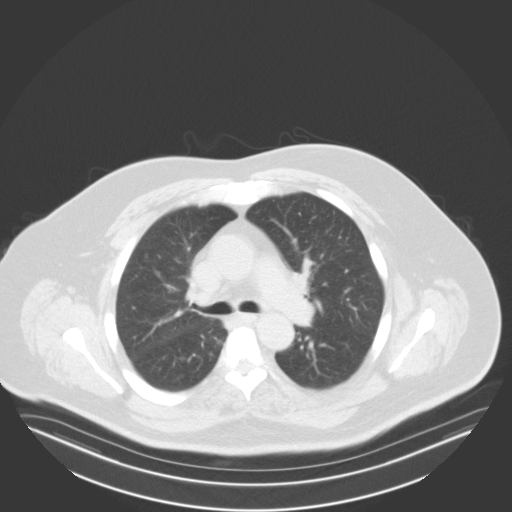
[im 43/62  lung]
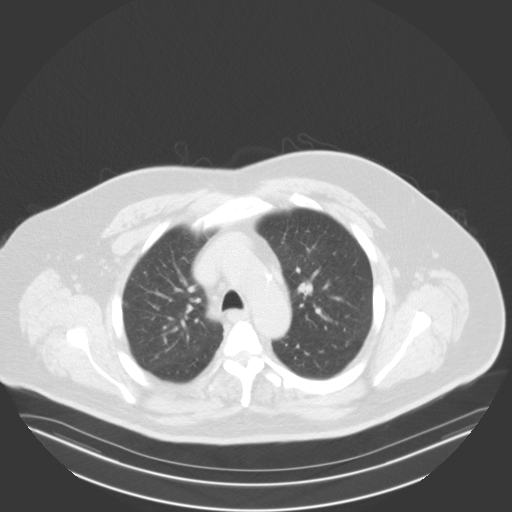
[im 48/62  lung]
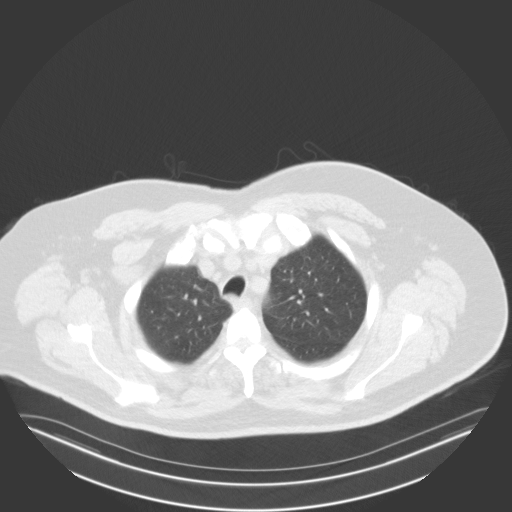
[im 50/62  mediastinal]
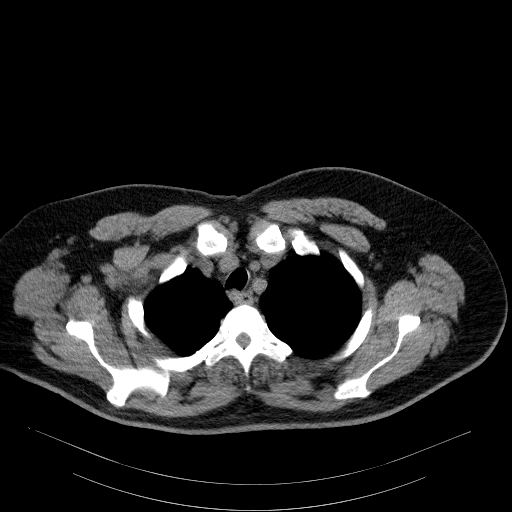
[im 50/62  lung]
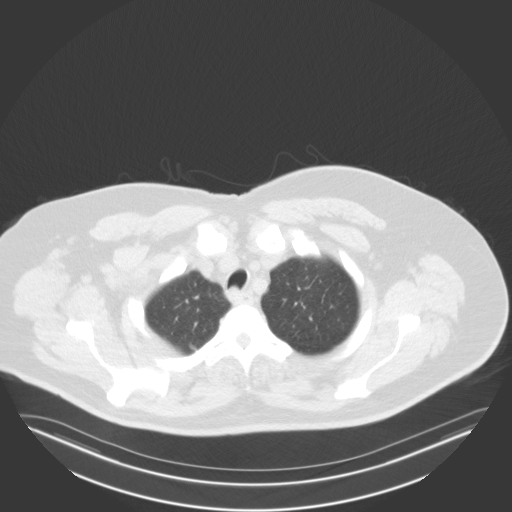
[im 55/62  lung]
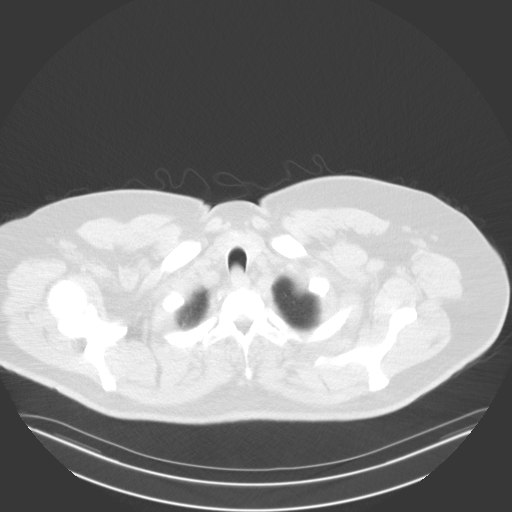
[im 59/62  lung]
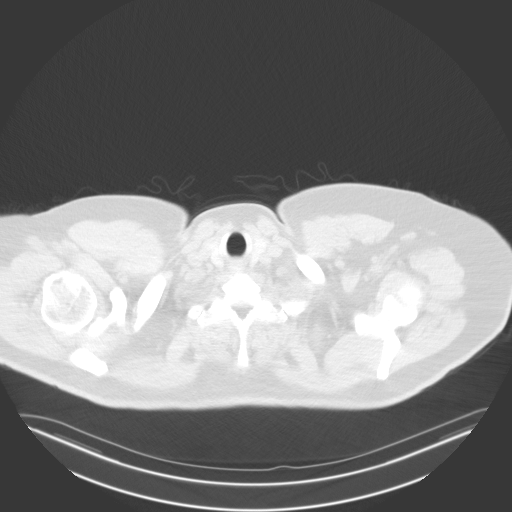

[15 of 31 positions shown; findings below may reference images not displayed]

FINDINGS: Cardiovascular: Normal heart size. No pericardial effusion. Aortic
atherosclerosis. Coronary artery calcifications.

Mediastinum/Nodes: Normal appearance of the thyroid gland. The
trachea appears patent and is midline. Normal appearance of the
esophagus. No enlarged lymph nodes.

Lungs/Pleura: No pleural effusion, airspace consolidation, or
atelectasis. Mild centrilobular emphysema. Scattered lung nodules
are identified. The largest nodule is in the peribronchovascular
right middle lobe with a mean derived diameter of 9.2 mm, image
170/3.

Upper Abdomen: Hepatic steatosis identified.  No acute abnormality.

Musculoskeletal: Lumbar spondylosis identified. No acute or
suspicious osseous findings.
IMPRESSION: 1. Lung-RADS 4A, suspicious. Follow up low-dose chest CT without
contrast in 3 months (please use the following order, "CT CHEST LCS
NODULE FOLLOW-UP W/O CM") is recommended. Alternatively, PET may be
considered when there is a solid component 8mm or larger.
2. Coronary artery calcifications.
3. Hepatic steatosis.
4. Emphysema and aortic atherosclerosis.

Aortic Atherosclerosis (9DE4J-X21.1) and Emphysema (9DE4J-FRN.9).

## 2023-06-15 MED ORDER — KETOROLAC TROMETHAMINE 15 MG/ML IJ SOLN
15.0000 mg | Freq: Once | INTRAMUSCULAR | Status: AC
  Administered 2023-06-15: 15 mg via INTRAVENOUS
  Filled 2023-06-15: qty 1

## 2023-06-15 MED ORDER — ONDANSETRON HCL 4 MG/2ML IJ SOLN
4.0000 mg | Freq: Once | INTRAMUSCULAR | Status: AC
  Administered 2023-06-15: 4 mg via INTRAVENOUS
  Filled 2023-06-15: qty 2

## 2023-06-15 MED ORDER — SODIUM CHLORIDE 0.9 % IV BOLUS
500.0000 mL | Freq: Once | INTRAVENOUS | Status: AC
  Administered 2023-06-15: 500 mL via INTRAVENOUS

## 2023-06-15 NOTE — Discharge Instructions (Signed)
As discussed, your symptoms are most likely secondary to passing a kidney stone based on your labs and imaging.  You can take Tylenol 500 mg as needed every 4-6 hours for pain.  The pain should get better over the next couple days.  Follow-up with your urologist for continued care for your unobstructed bilateral kidney stones.  Continue Flomax as prescribed as this can also help with your symptoms.  Get help right away if: You have a fever or chills. You get very bad pain. You get new pain in your belly. You faint. You cannot pee.

## 2023-06-15 NOTE — ED Provider Notes (Signed)
[place] EMERGENCY DEPARTMENT AT [facility] Provider Note   CSN: [id number] Arrival date & time: 06/15/23  1654     History  Chief Complaint  Patient presents with   Flank Pain    Aaron Burns is a 67 y.o. male with a history of hypertension, diabetes mellitus, and GERD who presents to the ED today for left flank pain that radiates to the abdomen.  Patient reports he first started having "achy" type back pain about a week ago.  Patient reports that yesterday the pain became more persistent and sharp.  He reports that he feels better when he sits still and does not move.  Additionally, patient reports nausea without vomiting.  No pain or difficulty with urination or hematuria.  No fever, chills, changes to bowel habits.  He has not taken any medication to help with the pain.  He reports a history of kidney stones in the past.  No other complaints or concerns at this time.    Home Medications Prior to Admission medications   Medication Sig Start Date End Date Taking? Authorizing Provider  acetaminophen (TYLENOL) 325 MG tablet Take 2 tablets (650 mg total) by mouth every 4 (four) hours as needed for mild pain ((score 1 to 3) or temp > 100.5). 10/18/19   Angiulli, Mcarthur Rossetti, PA-C  aspirin EC 81 MG tablet Take 81 mg by mouth daily. Swallow whole.    [provider]  Blood Glucose Monitoring Suppl DEVI Use to check blood sugar once daily 02/11/20   Judd Gaudier, NP  Cholecalciferol 125 MCG (5000 UT) capsule Take 10,000 Units by mouth daily.     [provider]  CINNAMON PO Take 1,000 mg by mouth daily.    [provider]  Dulaglutide (TRULICITY) 4.5 MG/0.5ML SOPN Inject 1 pen (4.5 mg) into Skin every 7 days for Diabetes  (Dx: e11.29 ) 01/19/23   Lucky Cowboy, MD  fenofibrate (TRICOR) 145 MG tablet Take  1 tablet  Daily  for Triglycerides (Blood Fats)                                              /                         TAKE                           BY                           MOUTH 08/27/22   Lucky Cowboy, MD  glucose blood (FREESTYLE LITE) test strip Test blood sugar once daily 02/11/20   Judd Gaudier, NP  Lancets MISC Test blood sugar once daily. 02/11/20   Judd Gaudier, NP  Magnesium 250 MG TABS Take 1 tablet (250 mg total) by mouth 2 (two) times daily with a meal. Patient taking differently: Take 400 mg by mouth every other day. 07/05/21   Judd Gaudier, NP  metFORMIN (GLUCOPHAGE-XR) 500 MG 24 hr tablet Take 2 tablets 2 x /day with Meals for Diabetes                                                                  /  TAKE                                       BY                                         MOUTH 02/27/23   Lucky Cowboy, MD  Multiple Vitamin (MULTIVITAMIN WITH MINERALS) TABS Take 1 tablet by mouth daily.    [provider]  olmesartan (BENICAR) 40 MG tablet Take 1 tab daily for blood pressure goal <130/80. 06/02/23   Adela Glimpse, NP  rosuvastatin (CRESTOR) 5 MG tablet TAKE ONE TABLET BY MOUTH ONCE WEEKLY IN THE EVENINGS ON SUNDAYS 08/26/22   Raynelle Dick, NP  tamsulosin (FLOMAX) 0.4 MG CAPS capsule TAKE 1 CAPSULE BY MOUTH DAILY AS NEEDED FOR KIDNEY STONES 06/01/23   Raynelle Dick, NP  topiramate (TOPAMAX) 25 MG tablet Take  1 to 2 tablets  at Night  for Dieting & Weight Loss 04/28/22   Lucky Cowboy, MD  vitamin C (ASCORBIC ACID) 500 MG tablet Take 1,000 mg by mouth daily.    [provider]  zinc gluconate 50 MG tablet Take 50 mg by mouth daily.    [provider]      Allergies    Ppd [tuberculin purified protein derivative]    Review of Systems   Review of Systems  Genitourinary:  Positive for flank pain.    Physical Exam Updated Vital Signs BP (!) 141/77 (BP Location: Right Arm)   Pulse 72   Temp 98.4 F (36.9 C) (Oral)   Resp 18   Ht 5\' 9"  (1.753 m)   Wt 108 kg   SpO2 97%   BMI 35.15 kg/m  Physical  Exam Vitals and nursing note reviewed.  Constitutional:      Appearance: Normal appearance.  HENT:     Head: Normocephalic and atraumatic.     Mouth/Throat:     Mouth: Mucous membranes are moist.  Eyes:     Conjunctiva/sclera: Conjunctivae normal.     Pupils: Pupils are equal, round, and reactive to light.  Cardiovascular:     Rate and Rhythm: Normal rate and regular rhythm.     Pulses: Normal pulses.  Pulmonary:     Effort: Pulmonary effort is normal.     Breath sounds: Normal breath sounds.  Abdominal:     Palpations: Abdomen is soft.     Tenderness: There is abdominal tenderness.     Comments: Left lower quadrant tenderness  Musculoskeletal:        General: Tenderness present. Normal range of motion.     Comments: Left flank tenderness  Skin:    General: Skin is warm and dry.     Findings: No erythema, lesion or rash.  Neurological:     General: No focal deficit present.     Mental Status: He is alert.  Psychiatric:        Mood and Affect: Mood normal.        Behavior: Behavior normal.     ED Results / Procedures / Treatments   Labs (all labs ordered are listed, but only abnormal results are displayed) Labs Reviewed  URINALYSIS, ROUTINE W REFLEX MICROSCOPIC - Abnormal; Notable for the following components:      Result Value   Protein, ur TRACE (*)  Leukocytes,Ua TRACE (*)    Bacteria, UA RARE (*)    All other components within normal limits  COMPREHENSIVE METABOLIC PANEL - Abnormal; Notable for the following components:   Glucose, Bld 116 (*)    Creatinine, Ser 1.41 (*)    Calcium 11.0 (*)    ALT 48 (*)    GFR, Estimated 54 (*)    All other components within normal limits  CBC    EKG None  Radiology CT Renal Stone Study  Result Date: 06/15/2023 CLINICAL DATA:  Left flank pain, history of renal stones EXAM: CT ABDOMEN AND PELVIS WITHOUT CONTRAST TECHNIQUE: Multidetector CT imaging of the abdomen and pelvis was performed following the standard protocol  without IV contrast. RADIATION DOSE REDUCTION: This exam was performed according to the departmental dose-optimization program which includes automated exposure control, adjustment of the mA and/or kV according to patient size and/or use of iterative reconstruction technique. COMPARISON:  CT renal stone protocol August 13, 2021, MRI abdomen with and without contrast September 25, 2020 FINDINGS: Lower chest: The visualized bibasilar lungs are clear. Normal heart size and no pericardial effusion. Hepatobiliary: No focal liver abnormality is seen. Status post cholecystectomy. No biliary dilatation. Pancreas: Mild fatty atrophy and scattered parenchymal calcifications. No pancreatic ductal dilatation or surrounding inflammatory changes. Spleen: Normal in size without focal abnormality. Adrenals/Urinary Tract: Bilateral adrenal glands are within normal limits. Bilateral nonobstructive nephrolithiasis, measuring up to 10 mm on the right and 2 mm on the left. No hydronephrosis or ureteral stones. The urinary bladder is decompressed. Stomach/Bowel: Stomach is within normal limits. Appendix appears normal. No evidence of bowel wall thickening, distention, or inflammatory changes. Vascular/Lymphatic: Aortic atherosclerosis. No enlarged abdominal or pelvic lymph nodes. Reproductive: Prostate is unremarkable. Other: No abdominal wall hernia or abnormality. No abdominopelvic ascites. Musculoskeletal: No acute osseous abnormalities. Status post L3-L5 posterior fusion with disc spacers, with intact hardware. Moderate multilevel degenerative disc disease of the lumbar spine. IMPRESSION: Bilateral nonobstructive nephrolithiasis. No hydronephrosis or evidence of ureteral stones. Electronically Signed   By: Jacob Moores M.D.   On: 06/15/2023 17:43    Procedures Procedures: Not indicated.   Medications Ordered in ED Medications  ketorolac (TORADOL) 15 MG/ML injection 15 mg (15 mg Intravenous Given 06/15/23 1801)  ondansetron  (ZOFRAN) injection 4 mg (4 mg Intravenous Given 06/15/23 1800)  sodium chloride 0.9 % bolus 500 mL (0 mLs Intravenous Stopped 06/15/23 1925)    ED Course/ Medical Decision Making/ A&P                             Medical Decision Making Amount and/or Complexity of Data Reviewed Labs: ordered. Radiology: ordered.  Risk Prescription drug management.   Patient is a 67 year old male with history of kidney stones who presents the ED today with left flank pain that radiates to his left lower quadrant.  He reports that he first noticed an achy pain about a week ago but the pain has become more constant and sharp since yesterday.  He reports associated nausea without vomiting.  No fever, chills, dysuria or hematuria. My differentials include: UTI versus pyelonephritis versus nephrolithiasis versus muscle strain, etc. Social terminates at the health include: housing, access to healthcare.  On physical exam, he has tenderness to his left flank and left lower quadrant.  No rash or erythema to the skin overlying the area.  Evaluation was otherwise unremarkable.  I ordered and interpreted labs and imaging. CMP was within patient's normal  limits - no AKI or electrolyte abnormality.  CBC was unremarkable - no acute infection or anemia.  UA shows calcium oxalate crystals present - indicating patient had a stone.  Since patient is not having pain or difficulty with urination I will not treat him for asymptomatic cystitis.  CT renal stone study showed bilateral nonobstructive nephrolithiasis.  No hydronephrosis or evidence of ureteral stones at this time.  I gave patient IV Toradol and Zofran for symptoms. On reevaluation patient's blood pressure was 86/54.  Patient was feeling fine but I ordered 500 mL normal saline to help raise the pressure.   I discussed results with the patient.  Symptoms are most likely secondary to a stone passing.  I informed him that I will not be treating him for asymptomatic  cystitis.  Patient understood and is agreeable.  During this time, patient's BP is 127/84. He is still receiving fluids.  I told him to take extra strength Tylenol as needed for left flank pain and that the pain should subside since the stone has passed.  Advised patient to follow-up with his urologist due to CT results.  Patient stable and safe for discharge home after completion of fluids.  Return precautions given.       Final Clinical Impression(s) / ED Diagnoses Final diagnoses:  Kidney stone    Rx / DC Orders ED Discharge Orders     None         Maxwell Marion, PA-C 06/15/23 1945    Tegeler, Canary Brim, MD 06/15/23 2001

## 2023-06-15 NOTE — ED Notes (Signed)
Discharge paperwork given and verbally understood. 

## 2023-06-15 NOTE — ED Triage Notes (Signed)
Pt reports left flank pain that radiates to his side and into his abdomen, onset a week ago. Hx of kidney stones and states this feels similar. Denies hematuria.

## 2023-07-16 ENCOUNTER — Encounter: Payer: PPO | Admitting: Nurse Practitioner

## 2023-07-25 ENCOUNTER — Emergency Department (HOSPITAL_BASED_OUTPATIENT_CLINIC_OR_DEPARTMENT_OTHER)
Admission: EM | Admit: 2023-07-25 | Discharge: 2023-07-26 | Disposition: A | Discharge status: Home / Self Care | Payer: PPO | Attending: Emergency Medicine | Admitting: Emergency Medicine

## 2023-07-25 ENCOUNTER — Emergency Department (HOSPITAL_BASED_OUTPATIENT_CLINIC_OR_DEPARTMENT_OTHER): Payer: PPO

## 2023-07-25 ENCOUNTER — Encounter (HOSPITAL_BASED_OUTPATIENT_CLINIC_OR_DEPARTMENT_OTHER): Payer: Self-pay | Admitting: Emergency Medicine

## 2023-07-25 ENCOUNTER — Emergency Department (HOSPITAL_BASED_OUTPATIENT_CLINIC_OR_DEPARTMENT_OTHER): Payer: PPO | Admitting: Radiology

## 2023-07-25 ENCOUNTER — Other Ambulatory Visit: Payer: Self-pay

## 2023-07-25 DIAGNOSIS — Y9389 Activity, other specified: Secondary | ICD-10-CM | POA: Insufficient documentation

## 2023-07-25 DIAGNOSIS — Z7984 Long term (current) use of oral hypoglycemic drugs: Secondary | ICD-10-CM | POA: Insufficient documentation

## 2023-07-25 DIAGNOSIS — Z794 Long term (current) use of insulin: Secondary | ICD-10-CM | POA: Diagnosis not present

## 2023-07-25 DIAGNOSIS — S300XXA Contusion of lower back and pelvis, initial encounter: Secondary | ICD-10-CM | POA: Insufficient documentation

## 2023-07-25 DIAGNOSIS — M533 Sacrococcygeal disorders, not elsewhere classified: Secondary | ICD-10-CM | POA: Diagnosis not present

## 2023-07-25 DIAGNOSIS — S199XXA Unspecified injury of neck, initial encounter: Secondary | ICD-10-CM | POA: Diagnosis not present

## 2023-07-25 DIAGNOSIS — W01198A Fall on same level from slipping, tripping and stumbling with subsequent striking against other object, initial encounter: Secondary | ICD-10-CM | POA: Insufficient documentation

## 2023-07-25 DIAGNOSIS — S0990XA Unspecified injury of head, initial encounter: Secondary | ICD-10-CM | POA: Insufficient documentation

## 2023-07-25 DIAGNOSIS — W19XXXA Unspecified fall, initial encounter: Secondary | ICD-10-CM

## 2023-07-25 DIAGNOSIS — Z7982 Long term (current) use of aspirin: Secondary | ICD-10-CM | POA: Insufficient documentation

## 2023-07-25 DIAGNOSIS — R42 Dizziness and giddiness: Secondary | ICD-10-CM

## 2023-07-25 DIAGNOSIS — E119 Type 2 diabetes mellitus without complications: Secondary | ICD-10-CM | POA: Insufficient documentation

## 2023-07-25 DIAGNOSIS — S3992XA Unspecified injury of lower back, initial encounter: Secondary | ICD-10-CM | POA: Diagnosis present

## 2023-07-25 NOTE — ED Triage Notes (Signed)
Patient c/o tailbone pain and head pain after fall.  Patient reports he fell backwards onto his tailbone and then fell back and hit his head while playing darts.

## 2023-07-26 LAB — CBG MONITORING, ED: Glucose-Capillary: 220 mg/dL — ABNORMAL HIGH (ref 70–99)

## 2023-07-26 MED ORDER — ACETAMINOPHEN 500 MG PO TABS
1000.0000 mg | ORAL_TABLET | Freq: Once | ORAL | Status: AC
  Administered 2023-07-26: 1000 mg via ORAL
  Filled 2023-07-26: qty 2

## 2023-07-26 NOTE — ED Notes (Signed)
RN reviewed discharge instructions with pt. Pt verbalized understanding and had no further questions

## 2023-07-26 NOTE — ED Provider Notes (Signed)
[place] EMERGENCY DEPARTMENT AT [facility]  Provider Note  CSN: [id number] Arrival date & time: 07/25/23 2149  History Chief Complaint  Patient presents with   Aaron Burns is a 67 y.o. male with history of DM reports he was playing darts earlier this evening when he bent over to pick one up and began feeling dizzy. He fell backwards, landing on his tailbone and hitting his head on a chair. Denies any LOC. Bystanders gave him some orange juice and he came to the ED for evaluation. He is complaining of tailbone pain and headache. Reports he has had some issues with getting dizzy in recent weeks. Wife attributes this to 'not eating enough'. He denies any CP, SOB, palpitations. Has not checked sugar since he fell.    Home Medications Prior to Admission medications   Medication Sig Start Date End Date Taking? Authorizing Provider  acetaminophen (TYLENOL) 325 MG tablet Take 2 tablets (650 mg total) by mouth every 4 (four) hours as needed for mild pain ((score 1 to 3) or temp > 100.5). 10/18/19   Angiulli, Mcarthur Rossetti, PA-C  aspirin EC 81 MG tablet Take 81 mg by mouth daily. Swallow whole.    [provider]  Blood Glucose Monitoring Suppl DEVI Use to check blood sugar once daily 02/11/20   Judd Gaudier, NP  Cholecalciferol 125 MCG (5000 UT) capsule Take 10,000 Units by mouth daily.     [provider]  CINNAMON PO Take 1,000 mg by mouth daily.    [provider]  Dulaglutide (TRULICITY) 4.5 MG/0.5ML SOPN Inject 1 pen (4.5 mg) into Skin every 7 days for Diabetes  (Dx: e11.29 ) 01/19/23   Lucky Cowboy, MD  fenofibrate (TRICOR) 145 MG tablet Take  1 tablet  Daily  for Triglycerides (Blood Fats)                                              /                         TAKE                           BY                          MOUTH 08/27/22   Lucky Cowboy, MD  glucose blood (FREESTYLE LITE) test strip Test blood sugar once daily 02/11/20   Judd Gaudier,  NP  Lancets MISC Test blood sugar once daily. 02/11/20   Judd Gaudier, NP  Magnesium 250 MG TABS Take 1 tablet (250 mg total) by mouth 2 (two) times daily with a meal. Patient taking differently: Take 400 mg by mouth every other day. 07/05/21   Judd Gaudier, NP  metFORMIN (GLUCOPHAGE-XR) 500 MG 24 hr tablet Take 2 tablets 2 x /day with Meals for Diabetes                                                                  /  TAKE                                       BY                                         MOUTH 02/27/23   Lucky Cowboy, MD  Multiple Vitamin (MULTIVITAMIN WITH MINERALS) TABS Take 1 tablet by mouth daily.    [provider]  olmesartan (BENICAR) 40 MG tablet Take 1 tab daily for blood pressure goal <130/80. 06/02/23   Adela Glimpse, NP  rosuvastatin (CRESTOR) 5 MG tablet TAKE ONE TABLET BY MOUTH ONCE WEEKLY IN THE EVENINGS ON SUNDAYS 08/26/22   Raynelle Dick, NP  tamsulosin (FLOMAX) 0.4 MG CAPS capsule TAKE 1 CAPSULE BY MOUTH DAILY AS NEEDED FOR KIDNEY STONES 06/01/23   Raynelle Dick, NP  topiramate (TOPAMAX) 25 MG tablet Take  1 to 2 tablets  at Night  for Dieting & Weight Loss 04/28/22   Lucky Cowboy, MD  vitamin C (ASCORBIC ACID) 500 MG tablet Take 1,000 mg by mouth daily.    [provider]  zinc gluconate 50 MG tablet Take 50 mg by mouth daily.    [provider]     Allergies    Ppd [tuberculin purified protein derivative]   Review of Systems   Review of Systems Please see HPI for pertinent positives and negatives  Physical Exam BP 136/69 (BP Location: Right Arm)   Pulse (!) 120   Temp 98.7 F (37.1 C) (Oral)   Resp 18   Wt 108 kg   SpO2 99%   BMI 35.15 kg/m   Physical Exam Vitals and nursing note reviewed.  Constitutional:      Appearance: Normal appearance.  HENT:     Head: Normocephalic and atraumatic.     Nose: Nose normal.     Mouth/Throat:     Mouth:  Mucous membranes are moist.  Eyes:     Extraocular Movements: Extraocular movements intact.     Conjunctiva/sclera: Conjunctivae normal.  Cardiovascular:     Rate and Rhythm: Normal rate.  Pulmonary:     Effort: Pulmonary effort is normal.     Breath sounds: Normal breath sounds.  Abdominal:     General: Abdomen is flat.     Palpations: Abdomen is soft.     Tenderness: There is no abdominal tenderness.  Musculoskeletal:        General: Tenderness (coccyx) present. No swelling. Normal range of motion.     Cervical back: Neck supple.  Skin:    General: Skin is warm and dry.  Neurological:     General: No focal deficit present.     Mental Status: He is alert.  Psychiatric:        Mood and Affect: Mood normal.     ED Results / Procedures / Treatments   EKG None  Procedures Procedures  Medications Ordered in the ED Medications  acetaminophen (TYLENOL) tablet 1,000 mg (has no administration in time range)    Initial Impression and Plan  Patient here after a fall, got dizzy while bending over. I personally viewed the images from radiology studies and agree with radiologist interpretation:  Xray and CT are neg for fracture or other signs of acute injury. CBG is mildly elevated. Offered additional  evaluation for his dizziness but he declines and states he will see his doctor about it. Recommend ice, APAP and a donut pillow for coccyx contusion. PCP follow up, RTED for any other concerns.    ED Course       MDM Rules/Calculators/A&P Medical Decision Making Problems Addressed: Coccyx contusion, initial encounter: acute illness or injury Dizziness: acute illness or injury Fall, initial encounter: acute illness or injury Injury of head, initial encounter: acute illness or injury  Amount and/or Complexity of Data Reviewed Labs: ordered. Decision-making details documented in ED Course. Radiology: ordered and independent interpretation performed. Decision-making details  documented in ED Course.  Risk OTC drugs.     Final Clinical Impression(s) / ED Diagnoses Final diagnoses:  Fall, initial encounter  Coccyx contusion, initial encounter  Injury of head, initial encounter  Dizziness    Rx / DC Orders ED Discharge Orders     None        Pollyann Savoy, MD 07/26/23 [phone]

## 2023-08-03 ENCOUNTER — Encounter: Payer: Self-pay | Admitting: Internal Medicine

## 2023-08-03 NOTE — Progress Notes (Unsigned)
Future Appointments  Date Time Provider Department  08/04/2023  9:30 AM Lucky Cowboy, MD [facility]  11/04/2023                         cpe   9:00 AM Adela Glimpse, NP [facility]  02/06/2024                          wellness  9:00 AM Adela Glimpse, NP [facility]    History of Present Illness:      This very nice 67 y.o. MWM presents for 9 month follow up with HTN, HLD, Pre-Diabetes and Vitamin D Deficiency. Chest CT scan in 2022 showed Coronary  calcifications & Aortic  Atherosclerosis.         Patient is treated for HTN  since  2008 & BP has been controlled at home. Today's BP is at goal - 120/70 .  In 2008 , he had a negative Stress Myoview. Patient has had no complaints of any cardiac type chest pain, palpitations, dyspnea Pollyann Kennedy /PND, dizziness, claudication or dependent edema.        Hyperlipidemia is controlled with diet & meds. Patient denies myalgias or other med SE's. Last Lipids were at goal except elevated Trig's  Lab Results  Component Value Date   CHOL 132 05/01/2023   HDL 36 (L) 05/01/2023   LDLCALC 57 05/01/2023   TRIG 375 (H) 05/01/2023   CHOLHDL 3.7 05/01/2023     Also, the patient has history of  Morbid Obesity ( BMI 36A+) and PreDiabetes in 2009 and T2_NIDDM (A1c 6.7%  /Dec 2016) with CKD3a (GFR 55) . He is currently taking Metformin & Trulicity 4.5.  He  has had no symptoms of reactive hypoglycemia, diabetic polys, paresthesias or visual blurring.  Last A1c was not at goal :  Lab Results  Component Value Date   HGBA1C 8.2 (H) 05/01/2023                                                             Further, the patient also has history of Vitamin D Deficiency ("35" /2008) and supplements vitamin D . Last vitamin D was at goal :  Lab Results  Component Value Date   VD25OH 91 05/01/2023       Current Outpatient Medications  Medication Instructions   acetaminophen 650 mg   Every 4 hours PRN   VITAMIN C   1,000 mg Daily   aspirin EC   81  mg Daily   Cholecalciferol   10,000 Unit  Daily   CINNAMON    1,000 mg Daily   TRULICITY 4.5 MG/0.5ML  Inject 1 pen (4.5 mg) into Skin every 7 days f   Fenofibrate  145 MG tablet Take  1 tablet  Daily    Magnesium   250 mg 2 times daily with meals   metFORMIN -XR 500 MG Take 2 tablets 2 x /day with Meals   Multiple Vitamin 1 tablet, Daily   olmesartan 40 MG tablet Take 1 tab daily for blood pressure   rosuvastatin  5 MG tablet TAKE ONE TABLET  ONCE WEEKLY    tamsulosin 0.4 MG CAPS  TAKE 1 CAPSULE  DAILY AS NEEDED  topiramate  25 MG tablet Take  1 to 2 tablets  at Night     zinc  50 mg Daily     Allergies  Allergen Reactions   Ppd [Tuberculin Purified Protein Derivative]     Pt states he is not allergic to this.Marland Kitchenpositive tb test 2014, pt took tb treatment 2014     PMHx:   Past Medical History:  Diagnosis Date   Diabetes mellitus, type 2 (HCC)    Elevated hemoglobin A1c    Heart murmur    at birth   History of kidney stones 2011   Hypertension    Leg DVT (deep venous thromboembolism), chronic, left (HCC) 05/16/2020   Dr. Tawanna Cooler Early evaluated 06/27/2020, advised d/c xarelto, no further treatment recommended for this chronic DVT.    Presumed provoked bilateral DVT following lumbar surgery in Oct 2020, follow up study in 05/13/2020 showed:  LEFT: - Findings consistent with chronic deep vein thrombosis involving the left peroneal veins. - No cystic structure found in the popliteal fossa. - Echogenic structures noted   Low back pain    Radiates to left ankle.  Injured after falling on back.   Morbid obesity (BMI 36. 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   took treatment for [facility]   Renal disorder    Tuberculosis    2014 dx, was treated with medications for about 4 months     Immunization History  Administered Date(s) Administered   Influenza Inj Mdck Quad  10/10/2017   Influenza Split 12/01/2013   Influenza, High Dose S 10/04/2021, 10/15/2022    Influenza, Quadrivalent, Recombinant, 08/14/2019   Influenza,inj,quad 08/23/2019   Influenza 10/02/2018, 12/11/2020   Pneumococcal -23 10/10/2017   Td 12/17/2003   Tdap 02/14/2015     Past Surgical History:  Procedure Laterality Date   ABDOMINAL EXPOSURE N/A 10/07/2019   Procedure: ABDOMINAL EXPOSURE;  Surgeon: Larina Earthly, MD;  Location: [facility] OR;  Service: Vascular;  Laterality: N/A;   ANTERIOR LUMBAR FUSION N/A 10/07/2019   Procedure: Oblique lateral interbody fusion LUMBAR FOUR-FIVE, anterior lateral interbody fusion LUMBAR THREE-FOUR;  Surgeon: Venita Lick, MD;  Location: [facility] OR;  Service: Orthopedics;  Laterality: N/A;  TAB BLOCK WITH EXPAREL   CHOLECYSTECTOMY  07/27/2012   Procedure: LAPAROSCOPIC CHOLECYSTECTOMY WITH INTRAOPERATIVE CHOLANGIOGRAM;  Surgeon: Clovis Pu. 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   Procedure: RIGHT TOTAL KNEE ARTHROPLASTY;  Surgeon: Durene Romans, MD;  Location: [facility];  Service: Orthopedics;  Laterality: Right;   WISDOM TOOTH EXTRACTION  yrs ago     FHx:    Reviewed / unchanged   SHx:    Reviewed / unchanged    Systems Review:  Constitutional: Denies fever, chills, wt changes, headaches, insomnia, fatigue, night sweats, change in appetite. Eyes: Denies redness, blurred vision, diplopia, discharge, itchy, watery eyes.  ENT: Denies discharge, congestion, post nasal drip, epistaxis, sore throat, earache, hearing loss, dental pain, tinnitus, vertigo, sinus pain, snoring.  CV: Denies chest pain, palpitations, irregular heartbeat, syncope, dyspnea, diaphoresis, orthopnea, PND, claudication or edema. Respiratory: denies cough, dyspnea, DOE, pleurisy, hoarseness, laryngitis,  wheezing.  Gastrointestinal: Denies dysphagia, odynophagia, heartburn, reflux, water brash, abdominal pain or cramps, nausea, vomiting,  bloating, diarrhea, constipation, hematemesis, melena, hematochezia  or hemorrhoids. Genitourinary: Denies dysuria, frequency, urgency, nocturia, hesitancy, discharge, hematuria or flank pain. Musculoskeletal: Denies arthralgias, myalgias, stiffness, jt. swelling, pain, limping or strain/sprain.  Skin: Denies pruritus, rash, hives, warts, acne, eczema or change in skin lesion(s). Neuro: No weakness, tremor, incoordination, spasms, paresthesia or pain. Psychiatric: Denies confusion, memory loss or sensory loss. Endo: Denies change in weight, skin or hair change.  Heme/Lymph: No excessive bleeding, bruising or enlarged lymph nodes.   Physical Exam  BP 120/70   Pulse 88   Temp 97.9 F (36.6 C)   Resp 16   Ht 5\' 10"  (1.778 m)   Wt 234 lb (106.1 kg)   SpO2 96%   BMI 33.58 kg/m   Appears  well nourished, well groomed  and in no distress.  Eyes: PERRLA, EOMs, conjunctiva no swelling or erythema. Sinuses: No frontal/maxillary tenderness ENT/Mouth: EAC's clear, TM's nl w/o erythema, bulging. Nares clear w/o erythema, swelling, exudates. Oropharynx clear without erythema or exudates. Oral hygiene is good. Tongue normal, non obstructing. Hearing intact.  Neck: Supple. Thyroid not palpable. Car 2+/2+ without bruits, nodes or JVD. Chest: Respirations nl with BS clear & equal w/o rales, rhonchi, wheezing or stridor.  Cor: Heart sounds normal w/ regular rate and rhythm without sig. murmurs, gallops, clicks or rubs. Peripheral pulses normal and equal  without edema.  Abdomen: Soft & bowel sounds normal. Non-tender w/o guarding, rebound, hernias, masses or organomegaly.  Lymphatics: Unremarkable.  Musculoskeletal: Full ROM all peripheral extremities, joint stability, 5/5 strength and normal gait.  Skin: Warm, dry without exposed rashes, lesions or ecchymosis  apparent.  Neuro: Cranial nerves intact, reflexes equal bilaterally. Sensory-motor testing grossly intact. Tendon reflexes grossly intact.  Pysch: Alert & oriented x 3.  Insight and judgement nl & appropriate. No ideations.   Assessment and Plan:  1. Essential hypertension  - Continue medication, monitor blood pressure at home.  - Continue DASH diet.  Reminder to go to the ER if any CP,  SOB, nausea, dizziness, severe HA, changes vision/speech.    - CBC with Differential/Platelet - COMPLETE METABOLIC PANEL WITH GFR - Magnesium - TSH  2. Hyperlipidemia associated with type 2 diabetes mellitus (HCC)  - Continue diet/meds, exercise,& lifestyle modifications.  - Continue monitor periodic cholesterol/liver & renal functions     - Lipid panel - TSH  3. Type 2 diabetes mellitus with stage 3a chronic kidney  disease, without long-term current use of insulin (HCC)  - Continue diet, exercise  - Lifestyle modifications.  - Monitor appropriate labs    - Hemoglobin A1c - Insulin, random  4. Vitamin D deficiency  - Continue supplementation    - VITAMIN D 25 Hydroxy   5. Aortic atherosclerosis (HCC) -CT 07/2021  - Lipid panel  6. Medication management  - CBC with Differential/Platelet - COMPLETE METABOLIC PANEL WITH GFR - Magnesium - Lipid panel - TSH - Hemoglobin A1c - Insulin, random - VITAMIN D 25 Hydroxy           Discussed  regular exercise, BP monitoring, weight control to achieve/maintain BMI less than 25 and discussed med and SE's. Recommended labs to assess /monitor clinical status .  I discussed the assessment and treatment plan with the patient. The patient was provided an opportunity to ask questions and all were answered. The patient agreed with the plan and demonstrated an understanding of the instructions.  I provided over 30 minutes of exam, counseling, chart review and  complex critical decision making.  The patient was advised to call back or seek  an in-person evaluation if the symptoms worsen or if the condition fails to improve as anticipated.   Marinus Maw, MD

## 2023-08-03 NOTE — Patient Instructions (Signed)

## 2023-08-04 ENCOUNTER — Telehealth: Payer: Self-pay

## 2023-08-04 ENCOUNTER — Ambulatory Visit (INDEPENDENT_AMBULATORY_CARE_PROVIDER_SITE_OTHER): Payer: PPO | Admitting: Internal Medicine

## 2023-08-04 ENCOUNTER — Encounter: Payer: Self-pay | Admitting: Internal Medicine

## 2023-08-04 VITALS — BP 120/70 | HR 88 | Temp 97.9°F | Resp 16 | Ht 70.0 in | Wt 234.0 lb

## 2023-08-04 DIAGNOSIS — E785 Hyperlipidemia, unspecified: Secondary | ICD-10-CM

## 2023-08-04 DIAGNOSIS — E559 Vitamin D deficiency, unspecified: Secondary | ICD-10-CM | POA: Diagnosis not present

## 2023-08-04 DIAGNOSIS — E1169 Type 2 diabetes mellitus with other specified complication: Secondary | ICD-10-CM | POA: Diagnosis not present

## 2023-08-04 DIAGNOSIS — N1831 Chronic kidney disease, stage 3a: Secondary | ICD-10-CM | POA: Diagnosis not present

## 2023-08-04 DIAGNOSIS — E1122 Type 2 diabetes mellitus with diabetic chronic kidney disease: Secondary | ICD-10-CM

## 2023-08-04 DIAGNOSIS — I10 Essential (primary) hypertension: Secondary | ICD-10-CM | POA: Diagnosis not present

## 2023-08-04 DIAGNOSIS — I700 Atherosclerosis of aorta: Secondary | ICD-10-CM | POA: Diagnosis not present

## 2023-08-04 DIAGNOSIS — Z79899 Other long term (current) drug therapy: Secondary | ICD-10-CM | POA: Diagnosis not present

## 2023-08-04 NOTE — Telephone Encounter (Signed)
Transition Care Management Unsuccessful Follow-up Telephone Call  Date of discharge and from where:  [facility] 8/10  Attempts:  2nd  Reason for unsuccessful TCM follow-up call:  No answer/busy   Lenard Forth [facility] Guide, [facility] [phone] [address] [place], [place], [place] [address] Phone: [phone] Email: [email or web address]

## 2023-08-04 NOTE — Telephone Encounter (Signed)
Transition Care Management Unsuccessful Follow-up Telephone Call  Date of discharge and from where:  [facility] 8/10  Attempts:  1st Attempt  Reason for unsuccessful TCM follow-up call:  No answer/busy   Lenard Forth [facility] Guide, [facility] [phone] [address] [place], [place], [place] [address] Phone: [phone] Email: [email or web address]

## 2023-08-05 LAB — CBC WITH DIFFERENTIAL/PLATELET
Absolute Monocytes: 646 {cells}/uL (ref 200–950)
Basophils Absolute: 51 {cells}/uL (ref 0–200)
Basophils Relative: 0.6 %
Eosinophils Absolute: 94 {cells}/uL (ref 15–500)
Eosinophils Relative: 1.1 %
HCT: 43.9 % (ref 38.5–50.0)
Hemoglobin: 14.2 g/dL (ref 13.2–17.1)
Lymphs Abs: 1777 cells/uL (ref 850–3900)
MCH: 29.6 pg (ref 27.0–33.0)
MCHC: 32.3 g/dL (ref 32.0–36.0)
MCV: 91.6 fL (ref 80.0–100.0)
MPV: 10.6 fL (ref 7.5–12.5)
Monocytes Relative: 7.6 %
Neutro Abs: 5933 {cells}/uL (ref 1500–7800)
Neutrophils Relative %: 69.8 %
Platelets: 280 10*3/uL (ref 140–400)
RBC: 4.79 10*6/uL (ref 4.20–5.80)
RDW: 12.9 % (ref 11.0–15.0)
Total Lymphocyte: 20.9 %
WBC: 8.5 10*3/uL (ref 3.8–10.8)

## 2023-08-05 LAB — COMPLETE METABOLIC PANEL WITH GFR
AG Ratio: 1.6 (calc) (ref 1.0–2.5)
ALT: 38 U/L (ref 9–46)
AST: 22 U/L (ref 10–35)
Albumin: 4.6 g/dL (ref 3.6–5.1)
Alkaline phosphatase (APISO): 49 U/L (ref 35–144)
BUN/Creatinine Ratio: 18 (calc) (ref 6–22)
BUN: 25 mg/dL (ref 7–25)
CO2: 28 mmol/L (ref 20–32)
Calcium: 11.4 mg/dL — ABNORMAL HIGH (ref 8.6–10.3)
Chloride: 105 mmol/L (ref 98–110)
Creat: 1.39 mg/dL — ABNORMAL HIGH (ref 0.70–1.35)
Globulin: 2.8 g/dL (ref 1.9–3.7)
Glucose, Bld: 259 mg/dL — ABNORMAL HIGH (ref 65–99)
Potassium: 4.6 mmol/L (ref 3.5–5.3)
Sodium: 141 mmol/L (ref 135–146)
Total Bilirubin: 0.3 mg/dL (ref 0.2–1.2)
Total Protein: 7.4 g/dL (ref 6.1–8.1)
eGFR: 55 mL/min/{1.73_m2} — ABNORMAL LOW (ref >=60)

## 2023-08-05 LAB — HEMOGLOBIN A1C
Hgb A1c MFr Bld: 7.6 %{Hb} — ABNORMAL HIGH (ref <5.7)
Mean Plasma Glucose: 171 mg/dL
eAG (mmol/L): 9.5 mmol/L

## 2023-08-05 LAB — MAGNESIUM: Magnesium: 1.4 mg/dL — ABNORMAL LOW (ref 1.5–2.5)

## 2023-08-05 LAB — LIPID PANEL
Cholesterol: 133 mg/dL (ref <200)
HDL: 35 mg/dL — ABNORMAL LOW (ref >=40)
Non-HDL Cholesterol (Calc): 98 mg/dL (ref <130)
Total CHOL/HDL Ratio: 3.8 (calc) (ref <5.0)
Triglycerides: 442 mg/dL — ABNORMAL HIGH (ref <150)

## 2023-08-05 LAB — VITAMIN D 25 HYDROXY (VIT D DEFICIENCY, FRACTURES): Vit D, 25-Hydroxy: 97 ng/mL (ref 30–100)

## 2023-08-05 LAB — TSH: TSH: 2.06 m[IU]/L (ref 0.40–4.50)

## 2023-08-05 LAB — INSULIN, RANDOM: Insulin: 78.8 u[IU]/mL — ABNORMAL HIGH

## 2023-08-05 NOTE — Progress Notes (Signed)
^<^<^<^<^<^<^<^<^<^<^<^<^<^<^<^<^<^<^<^<^<^<^<^<^<^<^<^<^<^<^<^<^<^<^<^<^  ^>^>^>^>^>^>^>^>^>^>^>>^>^>^>^>^>^>^>^>^>^>^>^>^>^>^>^>^>^>^>^>^>^>^>^>^>  -Test results slightly outside the reference range are not unusual.  If there is anything important, I will review this with you,  otherwise it is considered normal test values.  If you have further questions,  please do not hesitate to contact me at the office or via My Chart.   ^<^<^<^<^<^<^<^<^<^<^<^<^<^<^<^<^<^<^<^<^<^<^<^<^<^<^<^<^<^<^<^<^<^<^<^<^  ^>^>^>^>^>^>^>^>^>^>^>^>^>^>^>^>^>^>^>^>^>^>^>^>^>^>^>^>^>^>^>^>^>^>^>^>^   - Glucose = 259 mg% is WAY WAY TOO HIGH   ( Goal  is less than 120 mg%)   & as expected A1c = 7.6 % is also Way too High  ( Goal is less than 5.7% )   Your blood sugar and A1c are elevated.    Being Diabetic has a  300% increased risk for heart attack,                                                   stroke, cancer, and alzheimer- type vascular dementia.   It is very important that you work harder with diet by                                    avoiding all foods that are white except chicken,fish & calliflower.   - Avoid white rice  (brown & wild rice is OK),   - Avoid white potatoes  (sweet potatoes in moderation is OK),   White bread or wheat bread or anything made out of                                 white flour like bagels, donuts, rolls, buns, biscuits, cakes,    - pastries, cookies, pizza crust, and pasta (made from white flour & egg whites)   - vegetarian pasta or spinach or wheat pasta is OK.   - Multigrain breads like Arnold's, Pepperidge Farm or                                            multigrain sandwich thins or high fiber breads like                                                 Eureka bread or "Dave's Killer" breads that are                                                                                4 to 5 grams fiber per slice !  are best.     - Diet, exercise and weight  loss is very important in the  control and prevention of complications of diabetes which                                                                                  affects every system in your body, ie.   -Brain - dementia/stroke,  - eyes - glaucoma/blindness,  - heart - heart attack/heart failure,  - kidneys - dialysis,  - stomach - gastric paralysis,  - intestines - malabsorption,  - nerves - severe painful neuritis,  - circulation - gangrene & loss of a leg(s)  - and finally  . . . . . . . . . . . . . . . . . .    - cancer and Alzheimers.   ^<^<^<^<^<^<^<^<^<^<^<^<^<^<^<^<^<^<^<^<^<^<^<^<^<^<^<^<^<^<^<^<^<^<^<^<^  ^>^>^>^>^>^>^>^>^>^>^>^>^>^>^>^>^>^>^>^>^>^>^>^>^>^>^>^>^>^>^>^>^>^>^>^>^   -   PTH & Calcium are both elevated which is called   "  HyperParathyroidism "  which  is a consequence  of the Damage to your Kidneys   from your Diabetes  & the only treatment is improving                                                  your diabetic control by a better diet & losing weight  !   ^>^<^<^<^<^<^<^<^<^<^<^<^<^<^<^<^<^<^<^<^<^<^<^<^<^<^<^<^<^<^<^<^<^<^<^<^  ^>^>^>^>^>^>^>^>^>^>^>^>^>^>^>^>^>^>^>^>^>^>^>^>^>^>^>^>^>^>^>^>^>^>^>^>^   -    Magnesium   =  1.4   is Very Very  low  - goal is betw 2.0 - 2.5,   - So..............[name]  Recommend that you take  Magnesium 500 mg tablet 3 / day with Meals   - also important to eat lots of  leafy green vegetables   - spinach - Kale - collards - greens - okra - asparagus - broccoli - quinoa   - squash - almonds - black, red, white beans  -  peas - green beans  ^<^<^<^<^<^<^<^<^<^<^<^<^<^<^<^<^<^<^<^<^<^<^<^<^<^<^<^<^<^<^<^<^<^<^<^<^  ^>^>^>^>^>^>^>^>^>^>^>^>^>^>^>^>^>^>^>^>^>^>^>^>^>^>^>^>^>^>^>^>^>^>^>^>^   -   Chol= 133 is    Excellent !  - Very low risk for Heart Attack  /  Stroke ^>^>^>^>^>^>^>^>^>^>^>^>^>^>^>^>^>^>^>^>^>^>^>^>^>^>^>^>^>^>^>^>^>^>^>^>^ ^>^>^>^>^>^>^>^>^>^>^>^>^>^>^>^>^>^>^>^>^>^>^>^>^>^>^>^>^>^>^>^>^>^>^>^>^  -  But Triglycerides (  =  442  ) or fats in blood are too high                 (   Ideal or  Goal is less than 150  !  )    - Recommend avoid fried & greasy foods,  sweets / candy,   - Avoid white rice  (brown or wild rice or Quinoa is OK),   - Avoid white potatoes  (sweet potatoes are OK)   - Avoid anything made from white flour                                 - bagels, doughnuts, rolls, buns, biscuits, white and   wheat breads, pizza crust and traditional  pasta made of white flour & egg white  - vegetarian pasta or spinach or wheat pasta is OK   - Multi-grain bread is OK - like multi-grain flat bread or  sandwich thins.   - Avoid alcohol in excess.   - Exercise is also important.  ^<^<^<^<^<^<^<^<^<^<^<^<^<^<^<^<^<^<^<^<^<^<^<^<^<^<^<^<^<^<^<^<^<^<^<^<^  ^>^>^>^>^>^>^>^>^>^>^>^>^>^>^>^>^>^>^>^>^>^>^>^>^>^>^>^>^>^>^>^>^>^>^>^>^   -   Vitamin D = 97 - Excellent  - Please keep dosage  same   ^<^<^<^<^<^<^<^<^<^<^<^<^<^<^<^<^<^<^<^<^<^<^<^<^<^<^<^<^<^<^<^<^<^<^<^<^  ^>^>^>^>^>^>^>^>^>^>^>^>^>^>^>^>^>^>^>^>^>^>^>^>^>^>^>^>^>^>^>^>^>^>^>^>^   -   All Else - CBC,  Electrolytes - Liver  & Thyroid    - all  Normal / OK  ^<^<^<^<^<^<^<^<^<^<^<^<^<^<^<^<^<^<^<^<^<^<^<^<^<^<^<^<^<^<^<^<^<^<^<^<^  ^>^>^>^>^>^>^>^>^>^>^>^>^>^>^>^>^>^>^>^>^>^>^>^>^>^>^>^>^>^>^>^>^>^>^>^>^

## 2023-08-06 ENCOUNTER — Other Ambulatory Visit: Payer: Self-pay | Admitting: Nurse Practitioner

## 2023-08-06 ENCOUNTER — Other Ambulatory Visit: Payer: Self-pay | Admitting: Internal Medicine

## 2023-08-06 DIAGNOSIS — E1169 Type 2 diabetes mellitus with other specified complication: Secondary | ICD-10-CM

## 2023-08-06 MED ORDER — FENOFIBRATE 145 MG PO TABS: ORAL_TABLET | ORAL | 3 refills | Status: DC

## 2023-08-11 ENCOUNTER — Other Ambulatory Visit: Payer: Self-pay

## 2023-08-11 DIAGNOSIS — Z79899 Other long term (current) drug therapy: Secondary | ICD-10-CM

## 2023-08-11 MED ORDER — TAMSULOSIN HCL 0.4 MG PO CAPS
ORAL_CAPSULE | ORAL | 0 refills | Status: DC
Start: 2023-08-11 — End: 2023-10-13

## 2023-08-16 ENCOUNTER — Other Ambulatory Visit: Payer: Self-pay | Admitting: Internal Medicine

## 2023-08-16 MED ORDER — TOPIRAMATE 25 MG PO TABS: ORAL_TABLET | ORAL | 3 refills | Status: DC

## 2023-08-27 ENCOUNTER — Encounter: Payer: Self-pay | Admitting: Internal Medicine

## 2023-10-12 ENCOUNTER — Other Ambulatory Visit: Payer: Self-pay | Admitting: Nurse Practitioner

## 2023-10-12 DIAGNOSIS — Z79899 Other long term (current) drug therapy: Secondary | ICD-10-CM

## 2023-10-28 ENCOUNTER — Other Ambulatory Visit: Payer: Self-pay | Admitting: Internal Medicine

## 2023-10-28 DIAGNOSIS — E1165 Type 2 diabetes mellitus with hyperglycemia: Secondary | ICD-10-CM

## 2023-11-04 ENCOUNTER — Encounter: Payer: Self-pay | Admitting: Nurse Practitioner

## 2023-11-04 ENCOUNTER — Ambulatory Visit (INDEPENDENT_AMBULATORY_CARE_PROVIDER_SITE_OTHER): Payer: PPO | Admitting: Nurse Practitioner

## 2023-11-04 VITALS — BP 110/66 | HR 80 | Temp 97.8°F | Ht 69.0 in | Wt 237.6 lb

## 2023-11-04 DIAGNOSIS — Z0000 Encounter for general adult medical examination without abnormal findings: Secondary | ICD-10-CM

## 2023-11-04 DIAGNOSIS — E559 Vitamin D deficiency, unspecified: Secondary | ICD-10-CM

## 2023-11-04 DIAGNOSIS — Z1389 Encounter for screening for other disorder: Secondary | ICD-10-CM

## 2023-11-04 DIAGNOSIS — Z136 Encounter for screening for cardiovascular disorders: Secondary | ICD-10-CM | POA: Diagnosis not present

## 2023-11-04 DIAGNOSIS — E1169 Type 2 diabetes mellitus with other specified complication: Secondary | ICD-10-CM | POA: Diagnosis not present

## 2023-11-04 DIAGNOSIS — E1121 Type 2 diabetes mellitus with diabetic nephropathy: Secondary | ICD-10-CM

## 2023-11-04 DIAGNOSIS — I10 Essential (primary) hypertension: Secondary | ICD-10-CM | POA: Diagnosis not present

## 2023-11-04 DIAGNOSIS — Z23 Encounter for immunization: Secondary | ICD-10-CM | POA: Diagnosis not present

## 2023-11-04 DIAGNOSIS — Z79899 Other long term (current) drug therapy: Secondary | ICD-10-CM

## 2023-11-04 DIAGNOSIS — I82502 Chronic embolism and thrombosis of unspecified deep veins of left lower extremity: Secondary | ICD-10-CM

## 2023-11-04 DIAGNOSIS — Z0001 Encounter for general adult medical examination with abnormal findings: Secondary | ICD-10-CM

## 2023-11-04 DIAGNOSIS — N183 Chronic kidney disease, stage 3 unspecified: Secondary | ICD-10-CM

## 2023-11-04 DIAGNOSIS — R160 Hepatomegaly, not elsewhere classified: Secondary | ICD-10-CM

## 2023-11-04 DIAGNOSIS — N181 Chronic kidney disease, stage 1: Secondary | ICD-10-CM | POA: Diagnosis not present

## 2023-11-04 DIAGNOSIS — R918 Other nonspecific abnormal finding of lung field: Secondary | ICD-10-CM

## 2023-11-04 DIAGNOSIS — E538 Deficiency of other specified B group vitamins: Secondary | ICD-10-CM

## 2023-11-04 DIAGNOSIS — Z87891 Personal history of nicotine dependence: Secondary | ICD-10-CM

## 2023-11-04 DIAGNOSIS — K760 Fatty (change of) liver, not elsewhere classified: Secondary | ICD-10-CM

## 2023-11-04 DIAGNOSIS — K219 Gastro-esophageal reflux disease without esophagitis: Secondary | ICD-10-CM

## 2023-11-04 DIAGNOSIS — E1122 Type 2 diabetes mellitus with diabetic chronic kidney disease: Secondary | ICD-10-CM

## 2023-11-04 DIAGNOSIS — Z125 Encounter for screening for malignant neoplasm of prostate: Secondary | ICD-10-CM | POA: Diagnosis not present

## 2023-11-04 DIAGNOSIS — E785 Hyperlipidemia, unspecified: Secondary | ICD-10-CM | POA: Diagnosis not present

## 2023-11-04 MED ORDER — TAMSULOSIN HCL 0.4 MG PO CAPS: ORAL_CAPSULE | ORAL | 2 refills | Status: DC

## 2023-11-04 NOTE — Patient Instructions (Signed)

## 2023-11-04 NOTE — Progress Notes (Signed)
CPE Assessment:   Diagnoses and all orders for this visit:  CPE Due annually  Health maintenance reviewed Healthily lifestyle goals set  Essential hypertension Discussed DASH (Dietary Approaches to Stop Hypertension) DASH diet is lower in sodium than a typical American diet. Cut back on foods that are high in saturated fat, cholesterol, and trans fats. Eat more whole-grain foods, fish, poultry, and nuts Remain active and exercise as tolerated daily.  Monitor BP at home-Call if greater than 130/80.  Check CMP/CBC  Leg DVT (deep venous thromboembolism), chronic, left (HCC) Chronic; vascular evaluated and advised stop elequis; monitor  Type 2 diabetes mellitus with stage 1 chronic kidney disease, without long-term current use of insulin [facility]) Education: Reviewed 'ABCs' of diabetes management  Discussed goals to be met and/or maintained include A1C (<7) Blood pressure (<130/80) Cholesterol (LDL <70) Continue Eye Exam yearly - completed 2023 Continue Dental Exam Q6 mo Discussed dietary recommendations Discussed Physical Activity recommendations Foot exam UTD Check A1C  Hyperlipidemia associated with type 2 diabetes mellitus (HCC) Discussed lifestyle modifications. Recommended diet heavy in fruits and veggies, omega 3's. Decrease consumption of animal meats, cheeses, and dairy products. Remain active and exercise as tolerated. Continue to monitor. Check lipids/TSH  CKD stage 1 due to type 2 diabetes mellitus (HCC) Discussed how what you eat and drink can aide in kidney protection. Stay well hydrated. Avoid high salt foods. Avoid NSAIDS. Keep BP and BG well controlled.   Take medications as prescribed. Remain active and exercise as tolerated daily. Maintain weight.  Continue to monitor. Check CMP/GFR/Microablumin  Microalbuminuric diabetic nephropathy (HCC) Increase fluids, avoid NSAIDS, monitor sugars, will monitor; on ACE/ARB   Gastroesophageal reflux disease,  unspecified whether esophagitis present No suspected reflux complications (Barret/stricture). Lifestyle modification:  wt loss, avoid meals 2-3h before bedtime. Consider eliminating food triggers:  chocolate, caffeine, EtOH, acid/spicy food.  Fatty liver Weight loss advised, avoid alcohol/tylenol, low carb diet, will monitor LFTs Dr. Dulce Burns following  Hepatomegaly Dr. Dulce Burns following, avoid tylenol, alcohol, weight loss encouraged  Former heavy tobacco smoker (41 pack year, quit 2020)/tobacco use Continue annual screening; see nodule for follow up CT plans  Lung nodule Follow up CT  05/2023 - Benign Continue annual screening.    Vitamin D deficiency At goal at recent check; continue to recommend supplementation for goal of 60-100  Morbid obesity (HCC) Discussed appropriate BMI Diet modification. Physical activity. Encouraged/praised to build confidence.  Medication management All medications discussed and reviewed in full. All questions and concerns regarding medications addressed.    Screening for prostate cancer Monitor PSA   Screening for hematuria or proteinuria Check and monitor Microalbumin / creatinine urine ratio Check and monitor Urinalysis, Routine w reflex microscopic  Notify office for further evaluation and treatment, questions or concerns if any reported s/s fail to improve.   Screening for cardiovascular disease Monitor EKG  Flu vaccine need Administered - tolerated well  B12 Deficiency Monitor levels  Elevated Calcium Check Intact PTH  Orders Placed This Encounter  Procedures   Flu vaccine HIGH DOSE PF(Fluzone Trivalent)   CBC with Differential/Platelet   COMPLETE METABOLIC PANEL WITH GFR   Lipid panel   TSH   Hemoglobin A1c   Insulin, random   VITAMIN D 25 Hydroxy (Vit-D Deficiency, Fractures)   Urinalysis, Routine w reflex microscopic   Microalbumin / creatinine urine ratio   Vitamin B12   PSA   Magnesium   Parathyroid Hormone,  Intact w/Ca   EKG 12-Lead   The patient was advised to  call back or seek an in-person evaluation if any symptoms worsen or if the condition fails to improve as anticipated.   Further disposition pending results of labs. Discussed med's effects and SE's.    I discussed the assessment and treatment plan with the patient. The patient was provided an opportunity to ask questions and all were answered. The patient agreed with the plan and demonstrated an understanding of the instructions.  Discussed med's effects and SE's. Screening labs and tests as requested with regular follow-up as recommended.  I provided 35 minutes of face-to-face time during this encounter including counseling, chart review, and critical decision making was preformed.  Today's Plan of Care is based on a patient-centered health care approach known as shared decision making - the decisions, tests and treatments allow for patient preferences and values to be balanced with clinical evidence.     Future Appointments  Date Time Provider Department Center  02/06/2024  9:00 AM Adela Glimpse, NP [facility] None  05/11/2024  9:30 AM Lucky Cowboy, MD [facility] None  11/03/2024  9:00 AM Adela Glimpse, NP [facility] None   Subjective:  Aaron Burns is a 67 y.o. male who presents for CPE and 3 month follow up for HTN, hyperlipidemia, T2DM with diabetic nephropathy, and vitamin D Def.   Overall he reports feeling well today.  He has no new concerns at this time.   He is married, 2 kids, no grandchildren. He is retired, formerly worked in [employer].  He has lumbar fusion in 09/2019 by Dr. Shon Burns, does have some residual pain limited to lower back without radicular sx. Takes 2 tab regular tylenol but limited benefit. Was recommended weight loss. Had DVT complication after surgery - Notably saw Dr. Tawanna Burns Early in Juy 2021 for left chronic DVT persistent despite xarelto x 6+ months, was advised to taper off, no  further follow up unless new sx.   Hx of elevated LFTs; [place] 04/2020 showed hepatomegaly and diffuse hepatic steatosis, several small hyperecchoic masses within spleen, possible hemangiomas - he was referred to GI Dr. 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 Note recent CT 08/12/2021 shows progression of fatty liver and interval enlargement of liver. He has not recently followed up  He is former smoker, quit in 2020, 40 pack year history. He had screening CT scan 07/23/2021 that showed concerning 9.2 mm nodule of RML recommended for 3 month follow up. Had f/u 05/2023 that revealed multiple small pulmonary nodules that are scattered throughout the lungs bilaterally, similar to prior studies, largest of which is the posterior aspect of the RUL.  No larger more suspicious appearing pulmonary nodules or masses were noted.  BMI is Body mass index is 35.09 kg/m., he has been working on diet and exercise.  Wt Readings from Last 3 Encounters:  11/04/23 237 lb 9.6 oz (107.8 kg)  08/04/23 234 lb (106.1 kg)  07/25/23 238 lb (108 kg)   His blood pressure has been controlled at home, today their BP is BP: 110/66 He does workout. He denies chest pain, shortness of breath, dizziness.   He has aortic atherosclerosis and coronary calcifications per CT 07/23/2021.    He is on cholesterol medication (rosuvastatin 5 mg and and fenofibrate 145 mg daily) denies myalgias. His cholesterol is not at goal with elevated triglycerides. Reports rare alcohol. The cholesterol last visit was:   Lab Results  Component Value Date   CHOL 133 08/04/2023   HDL 35 (L) 08/04/2023  LDLCALC  08/04/2023     Comment:     . LDL cholesterol not calculated. Triglyceride levels greater than 400 mg/dL invalidate calculated LDL results. . Reference range: <100 . Desirable range <100 mg/dL for primary prevention;   <70 mg/dL for patients with CHD or diabetic patients  with >  or = 2 CHD risk factors. [name] LDL-C is now calculated using the Martin-Hopkins  calculation, which is a validated novel method providing  better accuracy than the Friedewald equation in the  estimation of LDL-C.  Horald Pollen et al. Lenox Ahr. 9147;829(56): 2061-2068  (http://education.[facility].com/faq/FAQ164)    TRIG 442 (H) 08/04/2023   CHOLHDL 3.8 08/04/2023   He has been working on diet and exercise for T2 diabetes, on metformin 2000 mg daily, glipizide 5 mg BID, and denies hyperglycemia, hypoglycemia , increased appetite, nausea, polydipsia, polyuria and visual disturbances. He does have mild tingling in bil toes but no recent worsening. Last A1C in the office was:  Lab Results  Component Value Date   HGBA1C 7.6 (H) 08/04/2023   He has CKD II/III associated with htn and T2DM monitored at this office. He is on ARB. Last GFR Lab Results  Component Value Date   GFRNONAA 54 (L) 06/15/2023   GFRNONAA 54 (L) 06/05/2022   GFRNONAA 59 (L) 05/07/2021   Patient is on Vitamin D supplement.   Lab Results  Component Value Date   VD25OH 97 08/04/2023    He continues to have elevated calcium levels with PTH intact reviewed PTH and low: Lab Results  Component Value Date   PTH 13 (L) 06/05/2022   PTH Comment 06/05/2022   CALCIUM 11.4 (H) 08/04/2023   CAION 1.16 11/25/2011    Medication Review:  Current Outpatient Medications (Endocrine & Metabolic):    Dulaglutide (TRULICITY) 4.5 MG/0.5ML SOAJ, INJECT 4.5MG  (0.5ML) UNDER THE SKIN ONCE A WEEK   metFORMIN (GLUCOPHAGE-XR) 500 MG 24 hr tablet, Take 2 tablets 2 x /day with Meals for Diabetes                                                                  /                                                         TAKE                                       BY                                         MOUTH  Current Outpatient Medications (Cardiovascular):    fenofibrate (TRICOR) 145 MG tablet, Take   1 tablet   Daily  for Triglycerides (Blood  Fats)                                                                                                        /  TAKE                           BY                          MOUTH   olmesartan (BENICAR) 40 MG tablet, Take 1 tab daily for blood pressure goal <130/80.   rosuvastatin (CRESTOR) 5 MG tablet, TAKE 1 TABLET BY MOUTH ONCE WEEKLY IN THE EVENING ON SUNDAYS   Current Outpatient Medications (Analgesics):    acetaminophen (TYLENOL) 325 MG tablet, Take 2 tablets (650 mg total) by mouth every 4 (four) hours as needed for mild pain ((score 1 to 3) or temp > 100.5).   aspirin EC 81 MG tablet, Take 81 mg by mouth daily. Swallow whole.   Current Outpatient Medications (Other):    Blood Glucose Monitoring Suppl DEVI, Use to check blood sugar once daily   Cholecalciferol 125 MCG (5000 UT) capsule, Take 10,000 Units by mouth daily.    CINNAMON PO, Take 1,000 mg by mouth daily.   glucose blood (FREESTYLE LITE) test strip, Test blood sugar once daily   Lancets MISC, Test blood sugar once daily.   Magnesium 250 MG TABS, Take 1 tablet (250 mg total) by mouth 2 (two) times daily with a meal. (Patient taking differently: Take 400 mg by mouth at bedtime.)   Multiple Vitamin (MULTIVITAMIN WITH MINERALS) TABS, Take 1 tablet by mouth daily.   topiramate (TOPAMAX) 25 MG tablet, Take  1 to 2 tablets  at Night  for Dieting & Weight Loss /                                                                   TAKE                                         BY                                                 MOUTH   vitamin C (ASCORBIC ACID) 500 MG tablet, Take 1,000 mg by mouth daily.   zinc gluconate 50 MG tablet, Take 50 mg by mouth daily.   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]     Pt states he is not allergic to this.Marland Kitchenpositive tb test 2014, pt  took tb treatment 2014    Current Problems (verified) has Hypertension; Type 2 diabetes mellitus (HCC); Vitamin D deficiency; Hyperlipidemia associated with type 2 diabetes mellitus (HCC); Medication management; Morbid obesity (HCC); FH: hypertension; Former heavy tobacco smoker (41 pack year, quit 2020); Fusion of lumbosacral spine; CKD stage 3 due to type 2 diabetes mellitus (HCC); LFT elevation; Gastroesophageal reflux disease; Microalbuminuric diabetic nephropathy (HCC); Fatty liver; Hepatomegaly; Lesion of spleen; Leg DVT (deep venous thromboembolism), chronic, left (HCC); Hypercalcemia; Lung nodules; History of renal  calculi; Kidney stones; Aortic atherosclerosis (HCC) -CT 07/2021; 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  Screening Tests Immunization History  Administered Date(s) Administered   Influenza Inj Mdck Quad With Preservative 10/10/2017   Influenza Split 12/01/2013   Influenza, High Dose Seasonal PF 10/04/2021, 10/15/2022, 11/04/2023   Influenza, Quadrivalent, Recombinant, Inj, Pf 08/14/2019   Influenza,inj,quad, With Preservative 08/23/2019   Influenza-Unspecified 10/02/2018, 12/11/2020   Pneumococcal Polysaccharide-23 10/10/2017   Td 12/17/2003   Tdap 02/14/2015    Preventative care: Last colonoscopy: 12/2018, Dr. Randa Evens, 2 benign polyps, 10 year follow up  Former and re-current smoker: 07/2021, nodule, schedule 3 month follow up - Lung CT due - order placed   Prior vaccinations: TD or Tdap: 2016  Influenza: 10/2023 Pneumococcal: 2018 Prevnar13: defer - plan on prevnar 20 next year Shingrix: discuss with insurance  Covid 19: declines   Names of Other Physician/Practitioners you currently use: 1. [facility] here for primary care 2. Dr. Martha Clan, eye doctor, last visit 2024 No Retinopathy (04/2023) 3. [employer], dentist,  last visit 2024, goes q77m, got implants    Patient Care Team: Lucky Cowboy, MD as PCP - General (Internal Medicine)  Surgical: He  has a past surgical history that includes Hernia repair; Cystoscopy/retrograde/ureteroscopy/stone extraction with basket (5/11); Wisdom tooth extraction (yrs ago); Knee surgery (Right, 11/25/11 and 2015); Cholecystectomy (07/27/2012); Total knee arthroplasty (Right, 06/06/2015); Colonoscopy; Anterior lumbar fusion (N/A, 10/07/2019); and Abdominal exposure (N/A, 10/07/2019). Family His family history includes Diabetes in his brother, daughter, father, mother, and sister; Hypertension in his mother; Kidney failure in his mother. Social history  He reports that he quit smoking about 4 years ago. His smoking use included cigarettes. He started smoking about 45 years ago. He has a 41.8 pack-year smoking history. He has never used smokeless tobacco. He reports current alcohol use. He reports that he does not use drugs.  Review of Systems  Constitutional:  Negative for malaise/fatigue and weight loss.  HENT:  Negative for hearing loss and tinnitus.   Eyes:  Negative for blurred vision and double vision.  Respiratory:  Negative for cough, sputum production, shortness of breath and wheezing.   Cardiovascular:  Negative for chest pain, palpitations, orthopnea, claudication, leg swelling and PND.  Gastrointestinal:  Negative for abdominal pain, blood in stool, constipation, diarrhea, heartburn, melena, nausea and vomiting.  Genitourinary: Negative.   Musculoskeletal:  Positive for back pain (chronic, lumbar). Negative for falls, joint pain and myalgias.  Skin:  Negative for rash.  Neurological:  Positive for tingling (bil feet, mild, unchanged). Negative for dizziness, sensory change, weakness and headaches.  Endo/Heme/Allergies:  Negative for polydipsia.  Psychiatric/Behavioral: Negative.  Negative for depression, memory loss, substance abuse and suicidal ideas. The  patient is not nervous/anxious and does not have insomnia.   All other systems reviewed and are negative.     Objective:   Today's Vitals   11/04/23 0900  BP: 110/66  Pulse: 80  Temp: 97.8 F (36.6 C)  SpO2: 96%  Weight: 237 lb 9.6 oz (107.8 kg)  Height: 5\' 9"  (1.753 m)     Body mass index is 35.09 kg/m.  General appearance: alert, no distress, WD/WN, morbidly obese male HEENT: normocephalic, sclerae anicteric, TMs pearly, nares patent, no discharge or erythema, pharynx normal Oral cavity: MMM, no lesions Neck: supple, no lymphadenopathy, no thyromegaly, no masses Heart: RRR, normal S1, S2, no murmurs Lungs: CTA  bilaterally, no wheezes, rhonchi, or rales Abdomen: +bs, soft obses, non tender, non distended, no masses, no hepatomegaly, no splenomegaly Musculoskeletal: nontender, no swelling, no obvious deformity Extremities: no edema, no cyanosis, no clubbing Pulses: 2+ symmetric, upper and lower extremities, normal cap refill Neurological: alert, oriented x 3, CN2-12 intact, strength normal upper extremities and lower extremities, sensation normal with monofilament throughout, DTRs 2+ throughout, no cerebellar signs, gait normal Psychiatric: normal affect, behavior normal, pleasant   EKG:  NSR   .Diabetic foot exam:  Left: Reflexes 2+   Vibratory sensation normal  Proprioception normal  Sharp/dull discrimination normal  Filament test present Right: Reflexes 2+   Vibratory sensation normal  Proprioception normal  Sharp/dull discrimination normal  Filament test present   Adela Glimpse, NP   11/04/2023

## 2023-11-05 LAB — CBC WITH DIFFERENTIAL/PLATELET
Absolute Lymphocytes: 1902 {cells}/uL (ref 850–3900)
Absolute Monocytes: 689 {cells}/uL (ref 200–950)
Basophils Absolute: 74 {cells}/uL (ref 0–200)
Basophils Relative: 0.9 %
Eosinophils Absolute: 131 {cells}/uL (ref 15–500)
Eosinophils Relative: 1.6 %
HCT: 45.5 % (ref 38.5–50.0)
Hemoglobin: 14.5 g/dL (ref 13.2–17.1)
MCH: 29.8 pg (ref 27.0–33.0)
MCHC: 31.9 g/dL — ABNORMAL LOW (ref 32.0–36.0)
MCV: 93.4 fL (ref 80.0–100.0)
MPV: 10.7 fL (ref 7.5–12.5)
Monocytes Relative: 8.4 %
Neutro Abs: 5404 {cells}/uL (ref 1500–7800)
Neutrophils Relative %: 65.9 %
Platelets: 271 10*3/uL (ref 140–400)
RBC: 4.87 10*6/uL (ref 4.20–5.80)
RDW: 12.7 % (ref 11.0–15.0)
Total Lymphocyte: 23.2 %
WBC: 8.2 10*3/uL (ref 3.8–10.8)

## 2023-11-05 LAB — COMPLETE METABOLIC PANEL WITH GFR
AG Ratio: 1.6 (calc) (ref 1.0–2.5)
ALT: 38 U/L (ref 9–46)
AST: 24 U/L (ref 10–35)
Albumin: 4.5 g/dL (ref 3.6–5.1)
Alkaline phosphatase (APISO): 49 U/L (ref 35–144)
BUN/Creatinine Ratio: 22 (calc) (ref 6–22)
BUN: 34 mg/dL — ABNORMAL HIGH (ref 7–25)
CO2: 27 mmol/L (ref 20–32)
Calcium: 10.9 mg/dL — ABNORMAL HIGH (ref 8.6–10.3)
Chloride: 105 mmol/L (ref 98–110)
Creat: 1.54 mg/dL — ABNORMAL HIGH (ref 0.70–1.35)
Globulin: 2.9 g/dL (ref 1.9–3.7)
Glucose, Bld: 154 mg/dL — ABNORMAL HIGH (ref 65–99)
Potassium: 5.3 mmol/L (ref 3.5–5.3)
Sodium: 142 mmol/L (ref 135–146)
Total Bilirubin: 0.3 mg/dL (ref 0.2–1.2)
Total Protein: 7.4 g/dL (ref 6.1–8.1)
eGFR: 49 mL/min/{1.73_m2} — ABNORMAL LOW (ref >=60)

## 2023-11-05 LAB — PSA: PSA: 0.85 ng/mL (ref <=4.00)

## 2023-11-05 LAB — MICROALBUMIN / CREATININE URINE RATIO
Creatinine, Urine: 111 mg/dL (ref 20–320)
Microalb Creat Ratio: 23 mg/g{creat} (ref <30)
Microalb, Ur: 2.6 mg/dL

## 2023-11-05 LAB — LIPID PANEL
Cholesterol: 141 mg/dL (ref <200)
HDL: 36 mg/dL — ABNORMAL LOW (ref >=40)
Non-HDL Cholesterol (Calc): 105 mg/dL (ref <130)
Total CHOL/HDL Ratio: 3.9 (calc) (ref <5.0)
Triglycerides: 430 mg/dL — ABNORMAL HIGH (ref <150)

## 2023-11-05 LAB — URINALYSIS, ROUTINE W REFLEX MICROSCOPIC
Bacteria, UA: NONE SEEN /[HPF]
Bilirubin Urine: NEGATIVE
Hgb urine dipstick: NEGATIVE
Hyaline Cast: NONE SEEN /[LPF]
Ketones, ur: NEGATIVE
Nitrite: NEGATIVE
Protein, ur: NEGATIVE
RBC / HPF: NONE SEEN /[HPF] (ref 0–2)
Specific Gravity, Urine: 1.021 (ref 1.001–1.035)
Squamous Epithelial / HPF: NONE SEEN /[HPF] (ref <=5)
pH: 5.5 (ref 5.0–8.0)

## 2023-11-05 LAB — MICROSCOPIC MESSAGE

## 2023-11-05 LAB — TSH: TSH: 2.52 m[IU]/L (ref 0.40–4.50)

## 2023-11-05 LAB — HEMOGLOBIN A1C
Hgb A1c MFr Bld: 8 %{Hb} — ABNORMAL HIGH (ref <5.7)
Mean Plasma Glucose: 183 mg/dL
eAG (mmol/L): 10.1 mmol/L

## 2023-11-05 LAB — MAGNESIUM: Magnesium: 1.9 mg/dL (ref 1.5–2.5)

## 2023-11-05 LAB — VITAMIN D 25 HYDROXY (VIT D DEFICIENCY, FRACTURES): Vit D, 25-Hydroxy: 86 ng/mL (ref 30–100)

## 2023-11-05 LAB — PTH, INTACT AND CALCIUM
Calcium: 10.9 mg/dL — ABNORMAL HIGH (ref 8.6–10.3)
PTH: 29 pg/mL (ref 16–77)

## 2023-11-05 LAB — INSULIN, RANDOM: Insulin: 20.9 u[IU]/mL — ABNORMAL HIGH

## 2023-11-05 LAB — VITAMIN B12: Vitamin B-12: 306 pg/mL (ref 200–1100)

## 2023-11-20 ENCOUNTER — Other Ambulatory Visit: Payer: Self-pay

## 2023-11-20 DIAGNOSIS — E1165 Type 2 diabetes mellitus with hyperglycemia: Secondary | ICD-10-CM

## 2023-11-20 MED ORDER — TRULICITY 4.5 MG/0.5ML ~~LOC~~ SOAJ
SUBCUTANEOUS | 0 refills | Status: AC
Start: 2023-11-20 — End: ?

## 2024-01-28 LAB — HM DIABETES EYE EXAM

## 2024-02-06 ENCOUNTER — Ambulatory Visit: Payer: PPO | Admitting: Nurse Practitioner

## 2024-02-10 ENCOUNTER — Other Ambulatory Visit: Payer: Self-pay

## 2024-02-10 DIAGNOSIS — E119 Type 2 diabetes mellitus without complications: Secondary | ICD-10-CM

## 2024-02-10 MED ORDER — METFORMIN HCL ER 500 MG PO TB24
ORAL_TABLET | ORAL | 0 refills | Status: DC
Start: 2024-02-10 — End: 2024-05-20

## 2024-04-07 ENCOUNTER — Ambulatory Visit (INDEPENDENT_AMBULATORY_CARE_PROVIDER_SITE_OTHER): Admitting: Family Medicine

## 2024-04-07 ENCOUNTER — Encounter: Payer: Self-pay | Admitting: Family Medicine

## 2024-04-07 VITALS — BP 112/86 | HR 76 | Temp 97.7°F | Ht 69.0 in | Wt 236.0 lb

## 2024-04-07 DIAGNOSIS — I10 Essential (primary) hypertension: Secondary | ICD-10-CM

## 2024-04-07 DIAGNOSIS — E876 Hypokalemia: Secondary | ICD-10-CM

## 2024-04-07 DIAGNOSIS — N200 Calculus of kidney: Secondary | ICD-10-CM

## 2024-04-07 DIAGNOSIS — E6609 Other obesity due to excess calories: Secondary | ICD-10-CM | POA: Insufficient documentation

## 2024-04-07 DIAGNOSIS — Z87891 Personal history of nicotine dependence: Secondary | ICD-10-CM | POA: Diagnosis not present

## 2024-04-07 DIAGNOSIS — Z7689 Persons encountering health services in other specified circumstances: Secondary | ICD-10-CM

## 2024-04-07 DIAGNOSIS — N1831 Chronic kidney disease, stage 3a: Secondary | ICD-10-CM | POA: Diagnosis not present

## 2024-04-07 DIAGNOSIS — E1169 Type 2 diabetes mellitus with other specified complication: Secondary | ICD-10-CM | POA: Diagnosis not present

## 2024-04-07 DIAGNOSIS — E785 Hyperlipidemia, unspecified: Secondary | ICD-10-CM | POA: Diagnosis not present

## 2024-04-07 DIAGNOSIS — E1122 Type 2 diabetes mellitus with diabetic chronic kidney disease: Secondary | ICD-10-CM

## 2024-04-07 DIAGNOSIS — Z7985 Long-term (current) use of injectable non-insulin antidiabetic drugs: Secondary | ICD-10-CM

## 2024-04-07 DIAGNOSIS — Z6834 Body mass index (BMI) 34.0-34.9, adult: Secondary | ICD-10-CM

## 2024-04-07 DIAGNOSIS — R918 Other nonspecific abnormal finding of lung field: Secondary | ICD-10-CM

## 2024-04-07 DIAGNOSIS — E66811 Obesity, class 1: Secondary | ICD-10-CM | POA: Insufficient documentation

## 2024-04-07 LAB — CBC WITH DIFFERENTIAL/PLATELET
Basophils Absolute: 0.1 10*3/uL (ref 0.0–0.1)
Basophils Relative: 0.7 % (ref 0.0–3.0)
Eosinophils Absolute: 0.1 10*3/uL (ref 0.0–0.7)
Eosinophils Relative: 1.1 % (ref 0.0–5.0)
HCT: 46.2 % (ref 39.0–52.0)
Hemoglobin: 15 g/dL (ref 13.0–17.0)
Lymphocytes Relative: 24.5 % (ref 12.0–46.0)
Lymphs Abs: 1.9 10*3/uL (ref 0.7–4.0)
MCHC: 32.5 g/dL (ref 30.0–36.0)
MCV: 92.8 fl (ref 78.0–100.0)
Monocytes Absolute: 0.7 10*3/uL (ref 0.1–1.0)
Monocytes Relative: 8.8 % (ref 3.0–12.0)
Neutro Abs: 5 10*3/uL (ref 1.4–7.7)
Neutrophils Relative %: 64.9 % (ref 43.0–77.0)
Platelets: 249 10*3/uL (ref 150.0–400.0)
RBC: 4.98 Mil/uL (ref 4.22–5.81)
RDW: 14.1 % (ref 11.5–15.5)
WBC: 7.7 10*3/uL (ref 4.0–10.5)

## 2024-04-07 LAB — COMPREHENSIVE METABOLIC PANEL WITH GFR
ALT: 39 U/L (ref 0–53)
AST: 28 U/L (ref 0–37)
Albumin: 4.7 g/dL (ref 3.5–5.2)
Alkaline Phosphatase: 39 U/L (ref 39–117)
BUN: 32 mg/dL — ABNORMAL HIGH (ref 6–23)
CO2: 30 meq/L (ref 19–32)
Calcium: 10.9 mg/dL — ABNORMAL HIGH (ref 8.4–10.5)
Chloride: 104 meq/L (ref 96–112)
Creatinine, Ser: 1.66 mg/dL — ABNORMAL HIGH (ref 0.40–1.50)
GFR: 41.86 mL/min — ABNORMAL LOW (ref >60.00)
Glucose, Bld: 141 mg/dL — ABNORMAL HIGH (ref 70–99)
Potassium: 5.2 meq/L — ABNORMAL HIGH (ref 3.5–5.1)
Sodium: 143 meq/L (ref 135–145)
Total Bilirubin: 0.4 mg/dL (ref 0.2–1.2)
Total Protein: 7.7 g/dL (ref 6.0–8.3)

## 2024-04-07 LAB — LIPID PANEL
Cholesterol: 127 mg/dL (ref 0–200)
HDL: 36.7 mg/dL — ABNORMAL LOW (ref >39.00)
LDL Cholesterol: 35 mg/dL (ref 0–99)
NonHDL: 90.39
Total CHOL/HDL Ratio: 3
Triglycerides: 278 mg/dL — ABNORMAL HIGH (ref 0.0–149.0)
VLDL: 55.6 mg/dL — ABNORMAL HIGH (ref 0.0–40.0)

## 2024-04-07 LAB — HEMOGLOBIN A1C: Hgb A1c MFr Bld: 7.3 % — ABNORMAL HIGH (ref 4.6–6.5)

## 2024-04-07 NOTE — Assessment & Plan Note (Signed)
 Elevated at last visit. Continue Fenofibrate  145mg  daily and Rosuvastatin  5mg  once a week. Ordered lipid panel and CMP to assess liver function.

## 2024-04-07 NOTE — Patient Instructions (Addendum)
-  It was nice to meet you and look forward to taking care of you. -Ordered labs. Office will call with lab results and you will see them on MyChart.  -Continue all prescribed medications. -Recommend a healthy diet and regular exercise. -Placed a referral to pulmonary for CT Lung Screening. -Follow up in 3 months and please make an Annual Wellness Exam.

## 2024-04-07 NOTE — Assessment & Plan Note (Signed)
 Ordered annual CT lung screening. Last completed in 05/2023.

## 2024-04-07 NOTE — Assessment & Plan Note (Signed)
 Stable. Not had a recent kidney stone. Continue taking Tamsulosin  0.4mg  daily PRN for kidney stones.

## 2024-04-07 NOTE — Assessment & Plan Note (Signed)
 One pound weight loss since November 2024. Continue Topiramate  25mg  daily.

## 2024-04-07 NOTE — Progress Notes (Signed)
 New Patient Office Visit  Subjective   Patient ID: Aaron Burns, male    DOB: [birth year]  Age: 68 y.o. MRN: [id number]  CC:  Chief Complaint  Patient presents with   Establish Care    HPI Aaron Burns presents to establish care with new provider.  Patients previous primary care provider was [facility] with Dr. Vangie Genet.  Specialist: None   HTN: Chronic. Patient is prescribed Olmesartan  40mg  daily. He reports he once in a while will monitor his blood pressure. Denies CP, SHOB, HA, dizziness, lightheadedness, or lower extremity edema.  BP Readings from Last 3 Encounters:  04/07/24 112/86  11/04/23 110/66  08/04/23 120/70    Diabetes: Chronic. Patient is prescribed Dulaglutide  4.5mg  injection weekly, Metformin  XR 500mg , 1 tablet in AM, lunch, and 2 tablets at dinner.  He reports he monitors his blood glucose once a week, ranges 108-115.  Lab Results  Component Value Date   HGBA1C 8.0 (H) 11/04/2023    Hyperlipidemia: Chronic. Patient is prescribed Fenofibrate  145mg  daily, and Rosuvastatin  5mg  once a week.  Lab Results  Component Value Date   CHOL 141 11/04/2023   HDL 36 (L) 11/04/2023   LDLCALC  11/04/2023     Comment:     . LDL cholesterol not calculated. Triglyceride levels greater than 400 mg/dL invalidate calculated LDL results. . Reference range: <100 . Desirable range <100 mg/dL for primary prevention;   <70 mg/dL for patients with CHD or diabetic patients  with > or = 2 CHD risk factors. Aaron Aaron Burns LDL-C is now calculated using the Martin-Hopkins  calculation, which is a validated novel method providing  better accuracy than the Friedewald equation in the  estimation of LDL-C.  Aaron Burns et al. Aaron Burns. [id number];119(14): 2061-2068  (http://education.[facility].com/faq/FAQ164)    TRIG 430 (H) 11/04/2023   CHOLHDL 3.9 11/04/2023    Kidney stones: Intermittent. Patient takes Tamsulosin  0.4mg  daily PRN for kidney stones.    Obesity: Chronic. Patient is taking Topiramate  25mg  tablet, 1-2 tablets at bedtime. He is taking only 1 tablet at night. He does not think it helping as much. Wt Readings from Last 3 Encounters:  04/07/24 236 lb (107 kg)  11/04/23 237 lb 9.6 oz (107.8 kg)  08/04/23 234 lb (106.1 kg)     Outpatient Encounter Medications as of 04/07/2024  Medication Sig   acetaminophen  (TYLENOL ) 325 MG tablet Take 2 tablets (650 mg total) by mouth every 4 (four) hours as needed for mild pain ((score 1 to 3) or temp > 100.5).   aspirin  EC 81 MG tablet Take 81 mg by mouth daily. Swallow whole.   Blood Glucose Monitoring Suppl DEVI Use to check blood sugar once daily   Cholecalciferol 125 MCG (5000 UT) capsule Take 10,000 Units by mouth daily.    CINNAMON PO Take 1,000 mg by mouth daily.   Dulaglutide  (TRULICITY ) 4.5 MG/0.5ML SOAJ INJECT 4.5MG  (0.5ML) UNDER THE SKIN ONCE A WEEK   fenofibrate  (TRICOR ) 145 MG tablet Take   1 tablet   Daily  for Triglycerides (Blood Fats)                                                                                                        /  TAKE                           BY                          MOUTH   glucose blood (FREESTYLE LITE) test strip Test blood sugar once daily   Lancets MISC Test blood sugar once daily.   Magnesium  250 MG TABS Take 1 tablet (250 mg total) by mouth 2 (two) times daily with a meal. (Patient taking differently: Take 400 mg by mouth at bedtime.)   metFORMIN  (GLUCOPHAGE -XR) 500 MG 24 hr tablet Take 2 tablets 2 x /day with Meals for Diabetes                                                                  /                                                         TAKE                                       BY                                         MOUTH   Multiple Vitamin (MULTIVITAMIN WITH MINERALS) TABS Take 1 tablet by mouth daily.   olmesartan  (BENICAR ) 40 MG tablet Take 1 tab daily for blood pressure goal <130/80.   rosuvastatin   (CRESTOR ) 5 MG tablet TAKE 1 TABLET BY MOUTH ONCE WEEKLY IN THE EVENING ON SUNDAYS   tamsulosin  (FLOMAX ) 0.4 MG CAPS capsule TAKE 1 CAPSULE BY MOUTH DAILY AS NEEDED FOR KIDNEY STONES   topiramate  (TOPAMAX ) 25 MG tablet Take  1 to 2 tablets  at Night  for Dieting & Weight Loss /                                                                   TAKE                                         BY                                                 MOUTH   vitamin C (ASCORBIC ACID) 500 MG tablet Take 1,000 mg by mouth daily.   zinc gluconate 50 MG tablet  Take 50 mg by mouth daily.   No facility-administered encounter medications on file as of 04/07/2024.    Past Medical History:  Diagnosis Date   Diabetes mellitus, type 2 (HCC)    Elevated hemoglobin A1c    Heart murmur    at birth   History of kidney stones 2011   Hypertension    Leg DVT (deep venous thromboembolism), chronic, left (HCC) 05/16/2020   Dr. Ena Harries Early evaluated 06/27/2020, advised d/c xarelto , no further treatment recommended for this chronic DVT.    Presumed provoked bilateral DVT following lumbar surgery in Oct 2020, follow up study in 05/13/2020 showed:  LEFT: - Findings consistent with chronic deep vein thrombosis involving the left peroneal veins. - No cystic structure found in the popliteal fossa. - Echogenic structures noted   Low back pain    Radiates to left ankle.  Injured after falling on back.   Morbid obesity (BMI 36. 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   [facility]   Renal disorder    Tuberculosis    2014 dx, was treated with medications for about 4 months    Past Surgical History:  Procedure Laterality Date   ABDOMINAL EXPOSURE N/A 10/07/2019   Procedure: ABDOMINAL EXPOSURE;  Surgeon: Mayo Speck, MD;  Location: [facility] OR;  Service: Vascular;  Laterality: N/A;   ANTERIOR LUMBAR FUSION N/A 10/07/2019   Procedure: Oblique lateral interbody fusion LUMBAR FOUR-FIVE, anterior  lateral interbody fusion LUMBAR THREE-FOUR;  Surgeon: Mort Ards, MD;  Location: [facility] OR;  Service: Orthopedics;  Laterality: N/A;  TAB BLOCK WITH EXPAREL    CHOLECYSTECTOMY  07/27/2012   Procedure: LAPAROSCOPIC CHOLECYSTECTOMY WITH INTRAOPERATIVE CHOLANGIOGRAM;  Surgeon: Brandy Cal. 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   Procedure: RIGHT TOTAL KNEE ARTHROPLASTY;  Surgeon: Claiborne Crew, MD;  Location: [facility] ORS;  Service: Orthopedics;  Laterality: Right;   WISDOM TOOTH EXTRACTION  yrs ago    Family History  Problem Relation Age of Onset   Hypertension Mother    Diabetes Mother    Kidney failure Mother    Diabetes Father    Other Father        Brain tumor   Diabetes Brother    Diabetes Daughter     Social History   Socioeconomic History   Marital status: Married    Spouse name: Not on file   Number of children: 2   Years of education: Not on file   Highest education level: High school graduate  Occupational History   Not on file  Tobacco Use   Smoking status: Some Days    Current packs/day: 0.00    Average packs/day: 1 pack/day for 41.8 years (41.8 ttl pk-yrs)    Types: Cigarettes    Start date: 12/16/1977    Last attempt to quit: 09/27/2019    Years since quitting: 4.5   Smokeless tobacco: Never  Vaping Use   Vaping status: Never Used  Substance and Sexual Activity   Alcohol use: Not Currently   Drug use: No   Sexual activity: Not Currently    Partners: Female  Other Topics Concern   Not on file  Social History Narrative  Not on file   Social Drivers of Health   Financial Resource Strain: Low Risk  (04/07/2024)   Overall Financial Resource Strain (CARDIA)    Difficulty of Paying Living  Expenses: Not hard at all  Food Insecurity: No Food Insecurity (04/07/2024)   Hunger Vital Sign    Worried About Running Out of Food in the Last Year: Never true    Ran Out of Food in the Last Year: Never true  Transportation Needs: No Transportation Needs (04/07/2024)   PRAPARE - Administrator, Civil Service (Medical): No    Lack of Transportation (Non-Medical): No  Physical Activity: Insufficiently Active (04/07/2024)   Exercise Vital Sign    Days of Exercise per Week: 2 days    Minutes of Exercise per Session: 30 min  Stress: No Stress Concern Present (04/07/2024)   [employer] of Occupational Health - Occupational Stress Questionnaire    Feeling of Stress : Only a little  Social Connections: Moderately Isolated (04/07/2024)   Social Connection and Isolation Panel [NHANES]    Frequency of Communication with Friends and Family: Three times a week    Frequency of Social Gatherings with Friends and Family: Once a week    Attends Religious Services: Never    Database administrator or Organizations: No    Attends Banker Meetings: Never    Marital Status: Married  Catering manager Violence: Not At Risk (04/07/2024)   Humiliation, Afraid, Rape, and Kick questionnaire    Fear of Current or Ex-Partner: No    Emotionally Abused: No    Physically Abused: No    Sexually Abused: No    ROS See HPI above    Objective  BP 112/86   Pulse 76   Temp 97.7 F (36.5 C) (Oral)   Ht 5\' 9"  (1.753 m)   Wt 236 lb (107 kg)   SpO2 95%   BMI 34.85 kg/m   Physical Exam Vitals reviewed.  Constitutional:      General: He is not in acute distress.    Appearance: Normal appearance. He is obese. He is not ill-appearing, toxic-appearing or diaphoretic.  HENT:     Head: Normocephalic and atraumatic.  Eyes:     General:        Right eye: No discharge.        Left eye: No discharge.     Conjunctiva/sclera: Conjunctivae normal.  Cardiovascular:     Rate and Rhythm:  Normal rate and regular rhythm.     Pulses:          Dorsalis pedis pulses are 2+ on the right side and 2+ on the left side.     Heart sounds: Normal heart sounds. No murmur Burns.    No friction rub. No gallop.  Pulmonary:     Effort: Pulmonary effort is normal. No respiratory distress.     Breath sounds: Normal breath sounds.  Musculoskeletal:        General: Normal range of motion.     Right foot: Normal range of motion.     Left foot: Normal range of motion.  Feet:     Right foot:     Protective Sensation: 10 sites tested.  8 sites sensed.     Skin integrity: Callus and dry skin present.     Toenail Condition: Right toenails are abnormally thick.     Left foot:     Protective Sensation: 10 sites tested.  8 sites sensed.  Skin integrity: Callus and dry skin present.     Toenail Condition: Left toenails are abnormally thick.  Skin:    General: Skin is warm and dry.  Neurological:     General: No focal deficit present.     Mental Status: He is alert and oriented to person, place, and time. Mental status is at baseline.  Psychiatric:        Mood and Affect: Mood normal.        Behavior: Behavior normal.        Thought Content: Thought content normal.        Judgment: Judgment normal.      Assessment & Plan:  Primary hypertension Assessment & Plan: Stable. Continue Olmesartan  40mg  daily. Ordered CBC and CMP.   Orders: -     CBC with Differential/Platelet -     Comprehensive metabolic panel with GFR  Type 2 diabetes mellitus with stage 3a chronic kidney disease, without long-term current use of insulin  [facility]) Assessment & Plan: Uncontrolled in November. Continue Dulaglutide  4.5mg  injection weekly and Metformin  XR 2,000mg  daily. Ordered A1c, CMP to assess kidney function, and microalbumin/creatinine urine. Foot exam completed today. Ophthalmology exam UTD. Patient is on an ARB and statin.   Orders: -     Comprehensive metabolic panel with GFR -     Hemoglobin A1c -      Microalbumin / creatinine urine ratio  Hyperlipidemia associated with type 2 diabetes mellitus (HCC) Assessment & Plan: Elevated at last visit. Continue Fenofibrate  145mg  daily and Rosuvastatin  5mg  once a week. Ordered lipid panel and CMP to assess liver function.   Orders: -     Comprehensive metabolic panel with GFR -     Lipid panel  History of tobacco abuse -     Ambulatory Referral for Lung Cancer Scre  Lung nodules Assessment & Plan: Ordered annual CT lung screening. Last completed in 05/2023.   Orders: -     Ambulatory Referral for Lung Cancer Scre  Class 1 obesity due to excess calories with serious comorbidity and body mass index (BMI) of 34.0 to 34.9 in adult Assessment & Plan: One pound weight loss since November 2024. Continue Topiramate  25mg  daily.    Kidney stones Assessment & Plan: Stable. Not had a recent kidney stone. Continue taking Tamsulosin  0.4mg  daily PRN for kidney stones.    Encounter to establish care   1.Review health maintenance:  -Covid boosters: Declines  -AWV: Needs appt.  -PNA vaccine: Obtain at local pharmacy  -Zoster vaccine: Obtain at local pharmacy  -CT Lung Screening: Ordered due to history of tobacco use and lung nodule.  Return in about 3 months (around 07/07/2024) for AWV: telehealth Dr. Burdette Carolin; , chronic management.   Ysidra Sopher, NP

## 2024-04-07 NOTE — Assessment & Plan Note (Signed)
 Stable. Continue Olmesartan  40mg  daily. Ordered CBC and CMP.

## 2024-04-07 NOTE — Assessment & Plan Note (Signed)
 Uncontrolled in November. Continue Dulaglutide  4.5mg  injection weekly and Metformin  XR 2,000mg  daily. Ordered A1c, CMP to assess kidney function, and microalbumin/creatinine urine. Foot exam completed today. Ophthalmology exam UTD. Patient is on an ARB and statin.

## 2024-04-08 ENCOUNTER — Encounter: Payer: Self-pay | Admitting: Family Medicine

## 2024-04-09 NOTE — Addendum Note (Signed)
 Addended by: Teodoro Feller B on: 04/09/2024 07:54 AM   Modules accepted: Orders

## 2024-05-11 ENCOUNTER — Ambulatory Visit: Payer: PPO | Admitting: Internal Medicine

## 2024-05-13 ENCOUNTER — Encounter: Payer: Self-pay | Admitting: Family Medicine

## 2024-05-13 ENCOUNTER — Ambulatory Visit: Admitting: Family Medicine

## 2024-05-13 VITALS — Wt 232.0 lb

## 2024-05-13 DIAGNOSIS — Z0000 Encounter for general adult medical examination without abnormal findings: Secondary | ICD-10-CM | POA: Diagnosis not present

## 2024-05-13 NOTE — Progress Notes (Signed)
 Patient unable to obtain vital signs due to telehealth visit

## 2024-05-13 NOTE — Progress Notes (Signed)
 PATIENT CHECK-IN and HEALTH RISK ASSESSMENT QUESTIONNAIRE:  -completed by phone/video for upcoming Medicare Preventive Visit  Pre-Visit Check-in: 1)Vitals (height, wt, BP, etc) - record in vitals section for visit on day of visit Request home vitals (wt, BP, etc.) and enter into vitals, THEN update Vital Signs SmartPhrase below at the top of the HPI. See below.  2)Review and Update Medications, Allergies PMH, Surgeries, Social history in Epic 3)Hospitalizations in the last year with date/reason? no  4)Review and Update Care Team (patient's specialists) in Epic 5) Complete PHQ9 in Epic  6) Complete Fall Screening in Epic 7)Review all Health Maintenance Due and order under PCP if not done.  Medicare Wellness Patient Questionnaire:  Answer theses question about your habits: How often do you have a drink containing alcohol? > than monthly  How many drinks containing alcohol do you have on a typical day when you are drinking? 1-2 How often do you have six or more drinks on one occasion?N/A Have you ever smoked? Yes  Quit date: 2 years ago  How many packs a day do/did you smoke? > 1 ppd for > 20 years Do you use smokeless tobacco? No Do you use an illicit drugs? No On average, how many days per week do you engage in moderate to strenuous exercise (like a brisk walk)? 2 days  On average, how many minutes do you engage in exercise at this level? 60 mins, walking Typical breakfast: Varies, cereal or pancakes Typical lunch: Varies - eats lots of veggies Typical dinner: Varies, usually home cooked meal Typical snacks: Cookies   Beverages:  Water    Answer theses question about your everyday activities: Can you perform most household chores? Yes  Are you deaf or have significant trouble hearing? Somewhat - has had hearing evaluation in the past, not quite to the point needs hearing aides Do you feel that you have a problem with memory? no Do you feel safe at home? Yes  Last dentist visit?  Upcoming appointment next month  8. Do you have any difficulty performing your everyday activities?no Are you having any difficulty walking, taking medications on your own, and or difficulty managing daily home needs?no Do you have difficulty walking or climbing stairs?Somewhat  Do you have difficulty dressing or bathing? no Do you have difficulty doing errands alone such as visiting a doctor's office or shopping?no Do you currently have any difficulty preparing food and eating? no Do you currently have any difficulty using the toilet?no Do you have any difficulty managing your finances?no Do you have any difficulties with housekeeping of managing your housekeeping?no   Do you have Advanced Directives in place (Living Will, Healthcare Power or Attorney)? Yes    Last eye Exam and location? 2-3 months ago   Do you currently use prescribed or non-prescribed narcotic or opioid pain medications? no  Do you have a history or close family history of breast, ovarian, tubal or peritoneal cancer or a family member with BRCA (breast cancer susceptibility 1 and 2) gene mutations? no   Nurse/Assistant Credentials/time stamp: mg 11:00   ----------------------------------------------------------------------------------------------------------------------------------------------------------------------------------------------------------------------  Because this visit was a virtual/telehealth visit, some criteria may be missing or patient reported. Any vitals not documented were not able to be obtained and vitals that have been documented are patient reported.    MEDICARE ANNUAL PREVENTIVE CARE VISIT WITH PROVIDER (Welcome to Medicare, initial annual wellness or annual wellness exam)  Virtual Visit via Phone Note  I connected with Aaron Burns on 05/13/24  by phone and  verified that I am speaking with the correct person using two identifiers.  He prefers phone visit.   Location patient:  home Location provider:work or home office Persons participating in the virtual visit: patient, provider  Concerns and/or follow up today: stable   See HM section in Epic for other details of completed HM.    ROS: negative for report of fevers, unintentional weight loss, vision changes, vision loss, hearing loss or change, chest pain, sob, hemoptysis, melena, hematochezia, hematuria, falls, bleeding or bruising, thoughts of suicide or self harm, memory loss  Patient-completed extensive health risk assessment - reviewed and discussed with the patient: See Health Risk Assessment completed with patient prior to the visit either above or in recent phone note. This was reviewed in detailed with the patient today and appropriate recommendations, orders and referrals were placed as needed per Summary below and patient instructions.   Review of Medical History: -PMH, PSH, Family History and current specialty and care providers reviewed and updated and listed below   Patient Care Team: Aaron Ingle, NP as PCP - General (Family Medicine)   Past Medical History:  Diagnosis Date   Diabetes mellitus, type 2 (HCC)    Elevated hemoglobin A1c    Heart murmur    at birth   History of kidney stones 2011   Hypertension    Leg DVT (deep venous thromboembolism), chronic, left (HCC) 05/16/2020   Dr. Ena Burns Early evaluated 06/27/2020, advised d/c xarelto , no further treatment recommended for this chronic DVT.    Presumed provoked bilateral DVT following lumbar surgery in Oct 2020, follow up study in 05/13/2020 showed:  LEFT: - Findings consistent with chronic deep vein thrombosis involving the left peroneal veins. - No cystic structure found in the popliteal fossa. - Echogenic structures noted   Low back pain    Radiates to left ankle.  Injured after falling on back.   Morbid obesity (BMI 36. 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   [facility]   Renal  disorder    Tuberculosis    2014 dx, was treated with medications for about 4 months    Past Surgical History:  Procedure Laterality Date   ABDOMINAL EXPOSURE N/A 10/07/2019   Procedure: ABDOMINAL EXPOSURE;  Surgeon: Aaron Speck, MD;  Location[facility] OR;  Service: Vascular;  Laterality: N/A;   ANTERIOR LUMBAR FUSION N/A 10/07/2019   Procedure: Oblique lateral interbody fusion LUMBAR FOUR-FIVE, anterior lateral interbody fusion LUMBAR THREE-FOUR;  Surgeon: Mort Ards, MD;  Location: [facility] OR;  Service: Orthopedics;  Laterality: N/A;  TAB BLOCK WITH EXPAREL    CHOLECYSTECTOMY  07/27/2012   Procedure: LAPAROSCOPIC CHOLECYSTECTOMY WITH INTRAOPERATIVE CHOLANGIOGRAM;  Surgeon: Brandy Cal. 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   Procedure: RIGHT TOTAL KNEE ARTHROPLASTY;  Surgeon: Claiborne Crew, MD;  Location: [facility] ORS;  Service: Orthopedics;  Laterality: Right;   WISDOM TOOTH EXTRACTION  yrs ago    Social History   Socioeconomic History   Marital status: Married    Spouse name: Not on file   Number of children: 2   Years of education: Not on file   Highest education level: High school graduate  Occupational History  Not on file  Tobacco Use   Smoking status: Some Days    Current packs/day: 0.00    Average packs/day: 1 pack/day for 41.8 years (41.8 ttl pk-yrs)    Types: Cigarettes    Start date: 12/16/1977    Last attempt to quit: 09/27/2019    Years since quitting: 4.6   Smokeless tobacco: Never  Vaping Use   Vaping status: Never Used  Substance and Sexual Activity   Alcohol use: Not Currently   Drug use: No   Sexual activity: Not Currently    Partners: Female  Other Topics Concern   Not on file  Social  History Narrative   Not on file   Social Drivers of Health   Financial Resource Strain: Low Risk  (04/07/2024)   Overall Financial Resource Strain (CARDIA)    Difficulty of Paying Living Expenses: Not hard at all  Food Insecurity: No Food Insecurity (04/07/2024)   Hunger Vital Sign    Worried About Running Out of Food in the Last Year: Never true    Ran Out of Food in the Last Year: Never true  Transportation Needs: No Transportation Needs (04/07/2024)   PRAPARE - Administrator, Civil Service (Medical): No    Lack of Transportation (Non-Medical): No  Physical Activity: Insufficiently Active (04/07/2024)   Exercise Vital Sign    Days of Exercise per Week: 2 days    Minutes of Exercise per Session: 30 min  Stress: No Stress Concern Present (04/07/2024)   [employer] of Occupational Health - Occupational Stress Questionnaire    Feeling of Stress : Only a little  Social Connections: Moderately Isolated (04/07/2024)   Social Connection and Isolation Panel [NHANES]    Frequency of Communication with Friends and Family: Three times a week    Frequency of Social Gatherings with Friends and Family: Once a week    Attends Religious Services: Never    Database administrator or Organizations: No    Attends Banker Meetings: Never    Marital Status: Married  Catering manager Violence: Not At Risk (04/07/2024)   Humiliation, Afraid, Rape, and Kick questionnaire    Fear of Current or Ex-Partner: No    Emotionally Abused: No    Physically Abused: No    Sexually Abused: No    Family History  Problem Relation Age of Onset   Hypertension Mother    Diabetes Mother    Kidney failure Mother    Diabetes Father    Other Father        Brain tumor   Diabetes Brother    Diabetes Daughter     Current Outpatient Medications on File Prior to Visit  Medication Sig Dispense Refill   acetaminophen  (TYLENOL ) 325 MG tablet Take 2 tablets (650 mg total) by mouth every 4  (four) hours as needed for mild pain ((score 1 to 3) or temp > 100.5).     aspirin  EC 81 MG tablet Take 81 mg by mouth daily. Swallow whole.     Blood Glucose Monitoring Suppl DEVI Use to check blood sugar once daily 1 each 0   Cholecalciferol 125 MCG (5000 UT) capsule Take 10,000 Units by mouth daily.      CINNAMON PO Take 1,000 mg by mouth daily.     Dulaglutide  (TRULICITY ) 4.5 MG/0.5ML SOAJ INJECT 4.5MG  (0.5ML) UNDER THE SKIN ONCE A WEEK 6 mL 0   fenofibrate  (TRICOR ) 145 MG tablet Take   1 tablet   Daily  for  Triglycerides (Blood Fats)                                                                                                        /                         TAKE                           BY                          MOUTH 90 tablet 3   glucose blood (FREESTYLE LITE) test strip Test blood sugar once daily 100 each 12   Lancets MISC Test blood sugar once daily. 100 each 11   Magnesium  250 MG TABS Take 1 tablet (250 mg total) by mouth 2 (two) times daily with a meal. (Patient taking differently: Take 400 mg by mouth at bedtime.)     metFORMIN  (GLUCOPHAGE -XR) 500 MG 24 hr tablet Take 2 tablets 2 x /day with Meals for Diabetes                                                                  /                                                         TAKE                                       BY                                         MOUTH 360 tablet 0   Multiple Vitamin (MULTIVITAMIN WITH MINERALS) TABS Take 1 tablet by mouth daily.     olmesartan  (BENICAR ) 40 MG tablet Take 1 tab daily for blood pressure goal <130/80. 90 tablet 3   rosuvastatin  (CRESTOR ) 5 MG tablet TAKE 1 TABLET BY MOUTH ONCE WEEKLY IN THE EVENING ON SUNDAYS 12 tablet 3   tamsulosin  (FLOMAX ) 0.4 MG CAPS capsule TAKE 1 CAPSULE BY MOUTH DAILY AS NEEDED FOR KIDNEY STONES 90 capsule 2   topiramate  (TOPAMAX ) 25 MG tablet Take  1 to 2 tablets  at Night  for Dieting & Weight Loss /  TAKE                                         BY                                                 MOUTH 180 tablet 3   vitamin C (ASCORBIC ACID) 500 MG tablet Take 1,000 mg by mouth daily.     zinc gluconate 50 MG tablet Take 50 mg by mouth daily.     No current facility-administered medications on file prior to visit.    Allergies  Allergen Reactions   Ppd [Tuberculin Purified Protein Derivative]     Pt states he is not allergic to this.Aaron Aaspositive tb test 2014, pt took tb treatment 2014       Physical Exam Vitals requested from patient and listed below if patient had equipment and was able to obtain at home for this virtual visit: There were no vitals filed for this visit. Estimated body mass index is 34.26 kg/m as calculated from the following:   Height as of 04/07/24: 5\' 9"  (1.753 m).   Weight as of this encounter: 232 lb (105.2 kg).  EKG (optional): deferred due to virtual visit  GENERAL: alert, oriented, no acute distress detected; full vision exam deferred due to pandemic and/or virtual encounter  PSYCH/NEURO: pleasant and cooperative, no obvious depression or anxiety, speech and thought processing grossly intact, Cognitive function grossly intact  Flowsheet Row Clinical Support from 05/13/2024 in [facility] [place]  PHQ-9 Total Score 3           05/13/2024   10:52 AM 04/07/2024   11:06 AM 08/03/2023    9:04 PM 05/01/2023    9:57 AM 10/15/2022   10:03 AM  Depression screen PHQ 2/9  Decreased Interest 0 2 0 0 0  Down, Depressed, Hopeless 0 0 0 0 0  PHQ - 2 Score 0 2 0 0 0  Altered sleeping 0 0     Tired, decreased energy 3 2     Change in appetite 0 0     Feeling bad or failure about yourself  0 0     Trouble concentrating 0 0     Moving slowly or fidgety/restless 0 0     Suicidal thoughts 0 0     PHQ-9 Score 3 4     Difficult doing work/chores Not difficult at all Not difficult at all          10/15/2022   10:03 AM 05/01/2023     9:56 AM 08/03/2023    9:04 PM 04/07/2024   11:07 AM 05/13/2024   10:53 AM  Fall Risk  Falls in the past year? 0 0 0 0 0  Was there an injury with Fall?    0 0  Fall Risk Category Calculator    0 0  Patient at Risk for Falls Due to   No Fall Risks  No Fall Risks  Fall risk Follow up   Education provided;Falls prevention discussed;Falls evaluation completed Falls evaluation completed Falls evaluation completed     SUMMARY AND PLAN:  Encounter for Medicare annual wellness exam   Discussed applicable health maintenance/preventive health measures and advised and referred or ordered per patient  preferences: -discussed vaccines due recs and risks; he declined the covid vaccine, knows he can get the other vaccines at the pharmacy and advised to let us  know if he does so that we can update his record -had PSA in November -reports gets the lung cancer screening, yearly, advised due in the summer Health Maintenance  Topic Date Due   Zoster Vaccines- Shingrix (1 of 2) Never done   Pneumonia Vaccine 78+ Years old (2 of 2 - PCV) 10/10/2018   Lung Cancer Screening  06/04/2024   COVID-19 Vaccine (1) 05/29/2024 (Originally 12/17/1960)   INFLUENZA VACCINE  07/16/2024   HEMOGLOBIN A1C  10/07/2024   Diabetic kidney evaluation - Urine ACR  11/03/2024   OPHTHALMOLOGY EXAM  01/27/2025   DTaP/Tdap/Td (3 - Td or Tdap) 02/13/2025   Diabetic kidney evaluation - eGFR measurement  04/07/2025   FOOT EXAM  04/07/2025   Medicare Annual Wellness (AWV)  05/13/2025   Colonoscopy  12/18/2028   Hepatitis C Screening  Completed   HPV VACCINES  Aged Out   Meningococcal B Vaccine  Aged Out     Education and counseling on the following was provided based on the above review of health and a plan/checklist for the patient, along with additional information discussed, was provided for the patient in the patient instructions :   -Advised and counseled on a healthy lifestyle - including the importance of a healthy diet,  regular physical activity, social connections and stress management. -Reviewed patient's current diet. Advised and counseled on a whole foods based healthy diet. Encouraged to limit/eliminate ultraprocessed food/drink and excessive added sugar. A summary of a healthy diet was provided in the Patient Instructions.  -reviewed patient's current physical activity level and discussed exercise guidelines for adults. Discussed community resources and ideas for safe exercise at home to assist in meeting exercise guideline recommendations in a safe and healthy way.  -Advise yearly dental visits at minimum and regular eye exams -Advised and counseled on alcohol safe limits, risks  Follow up: see patient instructions   Patient Instructions  I really enjoyed getting to talk with you today! I am available on Tuesdays and Thursdays for virtual visits if you have any questions or concerns, or if I can be of any further assistance.   CHECKLIST FROM ANNUAL WELLNESS VISIT:  -Follow up (please call to schedule if not scheduled after visit):   -yearly for annual wellness visit with primary care office  Here is a list of your preventive care/health maintenance measures and the plan for each if any are due:  PLAN For any measures below that may be due:    Health Maintenance  Topic Date Due   Zoster Vaccines- Shingrix (1 of 2) Never done - can get vaccines at the pharmacy, if you do, please let us  know so that we can update your record. Send proof of receipt to the office to do so.    Pneumonia Vaccine 1+ Years old (2 of 2 - PCV) 10/10/2018 - see above for vaccines   Lung Cancer Screening  06/04/2024 - please call if you are not contacted to schedule in the next 1 month.    COVID-19 Vaccine (1) Patient declined   INFLUENZA VACCINE  07/16/2024   HEMOGLOBIN A1C  10/07/2024   Diabetic kidney evaluation - Urine ACR  11/03/2024   OPHTHALMOLOGY EXAM  01/27/2025   DTaP/Tdap/Td (3 - Td or Tdap) 02/13/2025    Diabetic kidney evaluation - eGFR measurement  04/07/2025   FOOT EXAM  04/07/2025   Medicare Annual Wellness (AWV)  05/13/2025   Colonoscopy  12/18/2028   Hepatitis C Screening  Completed   HPV VACCINES  Aged Out   Meningococcal B Vaccine  Aged Out    -See a dentist at least yearly  -Get your eyes checked and then per your eye specialist's recommendations  -Other issues addressed today:   -I have included below further information regarding a healthy whole foods based diet, physical activity guidelines for adults, stress management and opportunities for social connections. I hope you find this information useful.   -----------------------------------------------------------------------------------------------------------------------------------------------------------------------------------------------------------------------------------------------------------    NUTRITION: -eat real food: lots of colorful vegetables (half the plate) and fruits -5-7 servings of vegetables and fruits per day (fresh or steamed is best), exp. 2 servings of vegetables with lunch and dinner and 2 servings of fruit per day. Berries and greens such as kale and collards are great choices.  -consume on a regular basis:  fresh fruits, fresh veggies, fish, nuts, seeds, healthy oils (such as olive oil, avocado oil), whole grains (make sure for bread/pasta/crackers/etc., that the first ingredient on label contains the word "whole"), legumes. -can eat small amounts of dairy and lean meat (no larger than the palm of your hand), but avoid processed meats such as ham, bacon, lunch meat, etc. -drink water  -try to avoid fast food and pre-packaged foods, processed meat, ultra processed foods/beverages (donuts, candy, etc.) -most experts advise limiting sodium to < 2300mg  per day, should limit further is any chronic conditions such as high blood pressure, heart disease, diabetes, etc. The American Heart Association advised  that < 1500mg  is is ideal -try to avoid foods/beverages that contain any ingredients with names you do not recognize  -try to avoid foods/beverages  with added sugar or sweeteners/sweets  -try to avoid sweet drinks (including diet drinks): soda, juice, Gatorade, sweet tea, power drinks, diet drinks -try to avoid white rice, white bread, pasta (unless whole grain)  EXERCISE GUIDELINES FOR ADULTS: -if you wish to increase your physical activity, do so gradually and with the approval of your doctor -STOP and seek medical care immediately if you have any chest pain, chest discomfort or trouble breathing when starting or increasing exercise  -move and stretch your body, legs, feet and arms when sitting for long periods -Physical activity guidelines for optimal health in adults: -get at least 150 minutes per week of moderate exercise (can talk, but not sing); this is about 20-30 minutes of sustained activity 5-7 days per week or two 10-15 minute episodes of sustained activity 5-7 days per week -do some muscle building/resistance training/strength training at least 2 days per week  -balance exercises 3+ days per week:   Stand somewhere where you have something sturdy to hold onto if you lose balance    1) lift up on toes, then back down, start with 5x per day and work up to 20x   2) stand and lift one leg straight out to the side so that foot is a few inches of the floor, start with 5x each side and work up to 20x each side   3) stand on one foot, start with 5 seconds each side and work up to 20 seconds on each side  If you need ideas or help with getting more active:  -Silver sneakers https://tools.silversneakers.com  -Walk with a Doc: http://www.duncan-williams.com/  -try to include resistance (weight lifting/strength building) and balance exercises twice per week: or the following link for  ideas: http://castillo-powell.com/  BuyDucts.dk  STRESS MANAGEMENT: -  can try meditating, or just sitting quietly with deep breathing while intentionally relaxing all parts of your body for 5 minutes daily -if you need further help with stress, anxiety or depression please follow up with your primary doctor or contact the wonderful folks at [facility]: [phone]  SOCIAL CONNECTIONS: -options in [place] if you wish to engage in more social and exercise related activities:  -Silver sneakers https://tools.silversneakers.com  -Walk with a Doc: http://www.duncan-williams.com/  -Check out the [facility] Active Adults 50+ section on the Green [employer] of [employer] (hiking clubs, book clubs, cards and games, chess, exercise classes, aquatic classes and much more) - see the website for details: https://www.[place].gov/departments/parks-recreation/active-adults50  -YouTube has lots of exercise videos for different ages and abilities as well  -[facility] (a variety of indoor and outdoor inperson activities for adults). 5136763740. [address].  -Virtual Online Classes (a variety of topics): see seniorplanet.org or call [phone]  -consider volunteering at a school, hospice center, church, senior center or elsewhere            Maurie Southern, DO

## 2024-05-13 NOTE — Patient Instructions (Signed)
 I really enjoyed getting to talk with you today! I am available on Tuesdays and Thursdays for virtual visits if you have any questions or concerns, or if I can be of any further assistance.   CHECKLIST FROM ANNUAL WELLNESS VISIT:  -Follow up (please call to schedule if not scheduled after visit):   -yearly for annual wellness visit with primary care office  Here is a list of your preventive care/health maintenance measures and the plan for each if any are due:  PLAN For any measures below that may be due:    Health Maintenance  Topic Date Due   Zoster Vaccines- Shingrix (1 of 2) Never done - can get vaccines at the pharmacy, if you do, please let us  know so that we can update your record. Send proof of receipt to the office to do so.    Pneumonia Vaccine 47+ Years old (2 of 2 - PCV) 10/10/2018 - see above for vaccines   Lung Cancer Screening  06/04/2024 - please call if you are not contacted to schedule in the next 1 month.    COVID-19 Vaccine (1) Patient declined   INFLUENZA VACCINE  07/16/2024   HEMOGLOBIN A1C  10/07/2024   Diabetic kidney evaluation - Urine ACR  11/03/2024   OPHTHALMOLOGY EXAM  01/27/2025   DTaP/Tdap/Td (3 - Td or Tdap) 02/13/2025   Diabetic kidney evaluation - eGFR measurement  04/07/2025   FOOT EXAM  04/07/2025   Medicare Annual Wellness (AWV)  05/13/2025   Colonoscopy  12/18/2028   Hepatitis C Screening  Completed   HPV VACCINES  Aged Out   Meningococcal B Vaccine  Aged Out    -See a dentist at least yearly  -Get your eyes checked and then per your eye specialist's recommendations  -Other issues addressed today:   -I have included below further information regarding a healthy whole foods based diet, physical activity guidelines for adults, stress management and opportunities for social connections. I hope you find this information useful.    -----------------------------------------------------------------------------------------------------------------------------------------------------------------------------------------------------------------------------------------------------------    NUTRITION: -eat real food: lots of colorful vegetables (half the plate) and fruits -5-7 servings of vegetables and fruits per day (fresh or steamed is best), exp. 2 servings of vegetables with lunch and dinner and 2 servings of fruit per day. Berries and greens such as kale and collards are great choices.  -consume on a regular basis:  fresh fruits, fresh veggies, fish, nuts, seeds, healthy oils (such as olive oil, avocado oil), whole grains (make sure for bread/pasta/crackers/etc., that the first ingredient on label contains the word "whole"), legumes. -can eat small amounts of dairy and lean meat (no larger than the palm of your hand), but avoid processed meats such as ham, bacon, lunch meat, etc. -drink water  -try to avoid fast food and pre-packaged foods, processed meat, ultra processed foods/beverages (donuts, candy, etc.) -most experts advise limiting sodium to < 2300mg  per day, should limit further is any chronic conditions such as high blood pressure, heart disease, diabetes, etc. The American Heart Association advised that < 1500mg  is is ideal -try to avoid foods/beverages that contain any ingredients with names you do not recognize  -try to avoid foods/beverages  with added sugar or sweeteners/sweets  -try to avoid sweet drinks (including diet drinks): soda, juice, Gatorade, sweet tea, power drinks, diet drinks -try to avoid white rice, white bread, pasta (unless whole grain)  EXERCISE GUIDELINES FOR ADULTS: -if you wish to increase your physical activity, do so gradually and with the approval of your  doctor -STOP and seek medical care immediately if you have any chest pain, chest discomfort or trouble breathing when starting or  increasing exercise  -move and stretch your body, legs, feet and arms when sitting for long periods -Physical activity guidelines for optimal health in adults: -get at least 150 minutes per week of moderate exercise (can talk, but not sing); this is about 20-30 minutes of sustained activity 5-7 days per week or two 10-15 minute episodes of sustained activity 5-7 days per week -do some muscle building/resistance training/strength training at least 2 days per week  -balance exercises 3+ days per week:   Stand somewhere where you have something sturdy to hold onto if you lose balance    1) lift up on toes, then back down, start with 5x per day and work up to 20x   2) stand and lift one leg straight out to the side so that foot is a few inches of the floor, start with 5x each side and work up to 20x each side   3) stand on one foot, start with 5 seconds each side and work up to 20 seconds on each side  If you need ideas or help with getting more active:  -Silver sneakers https://tools.silversneakers.com  -Walk with a Doc: http://www.duncan-williams.com/  -try to include resistance (weight lifting/strength building) and balance exercises twice per week: or the following link for ideas: http://castillo-powell.com/  BuyDucts.dk  STRESS MANAGEMENT: -can try meditating, or just sitting quietly with deep breathing while intentionally relaxing all parts of your body for 5 minutes daily -if you need further help with stress, anxiety or depression please follow up with your primary doctor or contact the wonderful folks at [facility]: [phone]  SOCIAL CONNECTIONS: -options in [place] if you wish to engage in more social and exercise related activities:  -Silver sneakers https://tools.silversneakers.com  -Walk with a Doc: http://www.duncan-williams.com/  -Check out the [facility] Active Adults 50+  section on the [place] [employer] (hiking clubs, book clubs, cards and games, chess, exercise classes, aquatic classes and much more) - see the website for details: https://www.[place].gov/departments/parks-recreation/active-adults50  -YouTube has lots of exercise videos for different ages and abilities as well  -[facility] (a variety of indoor and outdoor inperson activities for adults). [phone]. [address].  -Virtual Online Classes (a variety of topics): see seniorplanet.org or call [phone]  -consider volunteering at a school, hospice center, church, senior center or elsewhere

## 2024-05-20 ENCOUNTER — Telehealth: Payer: Self-pay

## 2024-05-20 ENCOUNTER — Telehealth: Payer: Self-pay | Admitting: *Deleted

## 2024-05-20 ENCOUNTER — Other Ambulatory Visit: Payer: Self-pay

## 2024-05-20 DIAGNOSIS — F17210 Nicotine dependence, cigarettes, uncomplicated: Secondary | ICD-10-CM

## 2024-05-20 DIAGNOSIS — E119 Type 2 diabetes mellitus without complications: Secondary | ICD-10-CM

## 2024-05-20 DIAGNOSIS — Z122 Encounter for screening for malignant neoplasm of respiratory organs: Secondary | ICD-10-CM

## 2024-05-20 DIAGNOSIS — Z87891 Personal history of nicotine dependence: Secondary | ICD-10-CM

## 2024-05-20 DIAGNOSIS — E1169 Type 2 diabetes mellitus with other specified complication: Secondary | ICD-10-CM

## 2024-05-20 DIAGNOSIS — I10 Essential (primary) hypertension: Secondary | ICD-10-CM

## 2024-05-20 MED ORDER — FENOFIBRATE 145 MG PO TABS
ORAL_TABLET | ORAL | 3 refills | Status: AC
Start: 2024-05-20 — End: ?

## 2024-05-20 MED ORDER — METFORMIN HCL ER 500 MG PO TB24: ORAL_TABLET | ORAL | 0 refills | Status: DC

## 2024-05-20 NOTE — Telephone Encounter (Signed)
Noted. meds refilled.

## 2024-05-20 NOTE — Telephone Encounter (Signed)
 Lung Cancer Screening Narrative/Criteria Questionnaire (Cigarette Smokers Only- No Cigars/Pipes/vapes)   Aaron Burns   SDMV:06/02/2024 at 11:00 am       1956-07-28   LDCT: 06/03/2024 at 9:20 am at [facility]    68 y.o.   Phone: [phone]  Lung Screening Narrative (confirm age 26-77 yrs Medicare / 50-80 yrs Private pay insurance)   Insurance information: HTA   Referring Provider: Macdonald Savoy   This screening involves an initial phone call with a team member from our program. It is called a shared decision making visit. The initial meeting is required by  insurance and Medicare to make sure you understand the program. This appointment takes about 15-20 minutes to complete. You will complete the screening scan at your scheduled date/time.  This scan takes about 5-10 minutes to complete. You can eat and drink normally before and after the scan.  Criteria questions for Lung Cancer Screening:   Are you a current or former smoker? Current Age began smoking: 19   If you are a former smoker, what year did you quit smoking?Quit 3 years (within 15 yrs)   To calculate your smoking history, I need an accurate estimate of how many packs of cigarettes you smoked per day and for how many years. (Not just the number of PPD you are now smoking)   Years smoking 48 x Packs per day 1 = Pack years 48   (at least 20 pack yrs)   (Make sure they understand that we need to know how much they have smoked in the past, not just the number of PPD they are smoking now)  Do you have a personal history of cancer?  No    Do you have a family history of cancer? Mother had a brain tumor unsure if cancer  Are you coughing up blood?  No  Have you had unexplained weight loss of 15 lbs or more in the last 6 months? No  It looks like you meet all criteria.  When would be a good time for us  to schedule you for this screening?   Additional information: N/A

## 2024-05-20 NOTE — Telephone Encounter (Signed)
 Copied from CRM [id number]. Topic: Clinical - Medication Question >> May 20, 2024 11:34 AM Janise Melia C wrote: Reason for CRM: patient called and stated that he needs all of his auto refills that he has at [facility] on [address] in [place], [place] to be updated by [place] because he just recently established care with her. I asked him which meds but he said he was unsure but he knows he is getting low on everything.

## 2024-06-01 ENCOUNTER — Other Ambulatory Visit: Payer: Self-pay | Admitting: Family Medicine

## 2024-06-01 DIAGNOSIS — E119 Type 2 diabetes mellitus without complications: Secondary | ICD-10-CM

## 2024-06-02 ENCOUNTER — Ambulatory Visit: Admitting: Acute Care

## 2024-06-02 DIAGNOSIS — F17210 Nicotine dependence, cigarettes, uncomplicated: Secondary | ICD-10-CM

## 2024-06-02 NOTE — Patient Instructions (Signed)

## 2024-06-02 NOTE — Progress Notes (Signed)
  Virtual Visit via Telephone Note  I connected with Alen Husbands on 06/02/24 at 11:00 AM EDT by telephone and verified that I am speaking with the correct person using two identifiers.  Location: Patient: [employer] Provider: Alyse Bach, RN   I discussed the limitations of evaluation and management by telemedicine and the availability of in person appointments. The patient expressed understanding and agreed to proceed.  Shared Decision Making Visit Lung Cancer Screening Program [phone])   Eligibility: Age 51 y.o. Pack Years Smoking History Calculation 46 (# packs/per year x # years smoked) Recent History of coughing up blood  no Unexplained weight loss? no ( >Than 15 pounds within the last 6 months ) Prior History Lung / other cancer no (Diagnosis within the last 5 years already requiring surveillance chest CT Scans). Smoking Status Current Smoker Former Smokers: Years since quit: n/a  Quit Date: n/a  Visit Components: Discussion included one or more decision making aids. yes Discussion included risk/benefits of screening. yes Discussion included potential follow up diagnostic testing for abnormal scans. yes Discussion included meaning and risk of over diagnosis. yes Discussion included meaning and risk of False Positives. yes Discussion included meaning of total radiation exposure. yes  Counseling Included: Importance of adherence to annual lung cancer LDCT screening. yes Impact of comorbidities on ability to participate in the program. yes Ability and willingness to under diagnostic treatment. yes  Smoking Cessation Counseling: Current Smokers:  Discussed importance of smoking cessation. yes Information about tobacco cessation classes and interventions provided to patient. yes Patient provided with ticket for LDCT Scan. no Symptomatic Patient. no  Counseling(Intermediate counseling: > three minutes) 99406 Diagnosis Code: Tobacco Use Z72.0 Asymptomatic Patient  yes  Counseling (Intermediate counseling: > three minutes counseling) U0454 Former Smokers:  Discussed the importance of maintaining cigarette abstinence. yes Diagnosis Code: Personal History of Nicotine Dependence. U98.119 Information about tobacco cessation classes and interventions provided to patient. Yes Patient provided with ticket for LDCT Scan. no Written Order for Lung Cancer Screening with LDCT placed in Epic. Yes (CT Chest Lung Cancer Screening Low Dose W/O CM) [id number] Z12.2-Screening of respiratory organs Z87.891-Personal history of nicotine dependence   Alyse Bach, RN

## 2024-06-03 ENCOUNTER — Other Ambulatory Visit: Payer: Self-pay

## 2024-06-03 ENCOUNTER — Ambulatory Visit
Admission: RE | Admit: 2024-06-03 | Discharge: 2024-06-03 | Disposition: A | Discharge status: Home / Self Care | Source: Ambulatory Visit | Attending: Acute Care | Admitting: Acute Care

## 2024-06-03 DIAGNOSIS — Z87891 Personal history of nicotine dependence: Secondary | ICD-10-CM

## 2024-06-03 DIAGNOSIS — I10 Essential (primary) hypertension: Secondary | ICD-10-CM

## 2024-06-03 DIAGNOSIS — Z122 Encounter for screening for malignant neoplasm of respiratory organs: Secondary | ICD-10-CM

## 2024-06-03 DIAGNOSIS — F17210 Nicotine dependence, cigarettes, uncomplicated: Secondary | ICD-10-CM

## 2024-06-03 MED ORDER — TOPIRAMATE 25 MG PO TABS: ORAL_TABLET | ORAL | 3 refills | Status: AC

## 2024-06-03 MED ORDER — ROSUVASTATIN CALCIUM 5 MG PO TABS: ORAL_TABLET | ORAL | 3 refills | Status: AC

## 2024-06-03 MED ORDER — OLMESARTAN MEDOXOMIL 40 MG PO TABS
ORAL_TABLET | ORAL | 3 refills | Status: AC
Start: 2024-06-03 — End: ?

## 2024-06-16 ENCOUNTER — Other Ambulatory Visit: Payer: Self-pay | Admitting: Acute Care

## 2024-06-16 DIAGNOSIS — Z87891 Personal history of nicotine dependence: Secondary | ICD-10-CM

## 2024-06-16 DIAGNOSIS — Z122 Encounter for screening for malignant neoplasm of respiratory organs: Secondary | ICD-10-CM

## 2024-07-07 ENCOUNTER — Ambulatory Visit (INDEPENDENT_AMBULATORY_CARE_PROVIDER_SITE_OTHER): Admitting: Family Medicine

## 2024-07-07 ENCOUNTER — Encounter: Payer: Self-pay | Admitting: Family Medicine

## 2024-07-07 ENCOUNTER — Telehealth: Payer: Self-pay

## 2024-07-07 VITALS — BP 130/84 | HR 70 | Temp 97.5°F | Ht 69.0 in | Wt 238.0 lb

## 2024-07-07 DIAGNOSIS — E6609 Other obesity due to excess calories: Secondary | ICD-10-CM | POA: Diagnosis not present

## 2024-07-07 DIAGNOSIS — E785 Hyperlipidemia, unspecified: Secondary | ICD-10-CM | POA: Diagnosis not present

## 2024-07-07 DIAGNOSIS — E1169 Type 2 diabetes mellitus with other specified complication: Secondary | ICD-10-CM

## 2024-07-07 DIAGNOSIS — E119 Type 2 diabetes mellitus without complications: Secondary | ICD-10-CM | POA: Diagnosis not present

## 2024-07-07 DIAGNOSIS — E66811 Obesity, class 1: Secondary | ICD-10-CM | POA: Diagnosis not present

## 2024-07-07 DIAGNOSIS — Z0000 Encounter for general adult medical examination without abnormal findings: Secondary | ICD-10-CM

## 2024-07-07 DIAGNOSIS — Z7985 Long-term (current) use of injectable non-insulin antidiabetic drugs: Secondary | ICD-10-CM

## 2024-07-07 DIAGNOSIS — Z6834 Body mass index (BMI) 34.0-34.9, adult: Secondary | ICD-10-CM | POA: Diagnosis not present

## 2024-07-07 DIAGNOSIS — E876 Hypokalemia: Secondary | ICD-10-CM

## 2024-07-07 DIAGNOSIS — Z7984 Long term (current) use of oral hypoglycemic drugs: Secondary | ICD-10-CM

## 2024-07-07 DIAGNOSIS — I10 Essential (primary) hypertension: Secondary | ICD-10-CM | POA: Diagnosis not present

## 2024-07-07 DIAGNOSIS — N200 Calculus of kidney: Secondary | ICD-10-CM

## 2024-07-07 LAB — COMPREHENSIVE METABOLIC PANEL WITH GFR
ALT: 34 U/L (ref 0–53)
AST: 27 U/L (ref 0–37)
Albumin: 4.7 g/dL (ref 3.5–5.2)
Alkaline Phosphatase: 40 U/L (ref 39–117)
BUN: 24 mg/dL — ABNORMAL HIGH (ref 6–23)
CO2: 30 meq/L (ref 19–32)
Calcium: 10.7 mg/dL — ABNORMAL HIGH (ref 8.4–10.5)
Chloride: 105 meq/L (ref 96–112)
Creatinine, Ser: 1.51 mg/dL — ABNORMAL HIGH (ref 0.40–1.50)
GFR: 46.82 mL/min — ABNORMAL LOW (ref >60.00)
Glucose, Bld: 128 mg/dL — ABNORMAL HIGH (ref 70–99)
Potassium: 5.3 meq/L — ABNORMAL HIGH (ref 3.5–5.1)
Sodium: 141 meq/L (ref 135–145)
Total Bilirubin: 0.4 mg/dL (ref 0.2–1.2)
Total Protein: 7.7 g/dL (ref 6.0–8.3)

## 2024-07-07 LAB — LIPID PANEL
Cholesterol: 140 mg/dL (ref 0–200)
HDL: 39.6 mg/dL (ref >39.00)
LDL Cholesterol: 39 mg/dL (ref 0–99)
NonHDL: 100.45
Total CHOL/HDL Ratio: 4
Triglycerides: 306 mg/dL — ABNORMAL HIGH (ref 0.0–149.0)
VLDL: 61.2 mg/dL — ABNORMAL HIGH (ref 0.0–40.0)

## 2024-07-07 LAB — CBC WITH DIFFERENTIAL/PLATELET
Basophils Absolute: 0.1 K/uL (ref 0.0–0.1)
Basophils Relative: 0.7 % (ref 0.0–3.0)
Eosinophils Absolute: 0.1 K/uL (ref 0.0–0.7)
Eosinophils Relative: 1.7 % (ref 0.0–5.0)
HCT: 44.7 % (ref 39.0–52.0)
Hemoglobin: 14.5 g/dL (ref 13.0–17.0)
Lymphocytes Relative: 24.5 % (ref 12.0–46.0)
Lymphs Abs: 2 K/uL (ref 0.7–4.0)
MCHC: 32.6 g/dL (ref 30.0–36.0)
MCV: 91.2 fl (ref 78.0–100.0)
Monocytes Absolute: 0.7 K/uL (ref 0.1–1.0)
Monocytes Relative: 8.3 % (ref 3.0–12.0)
Neutro Abs: 5.2 K/uL (ref 1.4–7.7)
Neutrophils Relative %: 64.8 % (ref 43.0–77.0)
Platelets: 233 K/uL (ref 150.0–400.0)
RBC: 4.9 Mil/uL (ref 4.22–5.81)
RDW: 14.1 % (ref 11.5–15.5)
WBC: 8 K/uL (ref 4.0–10.5)

## 2024-07-07 LAB — BASIC METABOLIC PANEL WITH GFR
BUN: 24 mg/dL — ABNORMAL HIGH (ref 6–23)
CO2: 30 meq/L (ref 19–32)
Calcium: 10.7 mg/dL — ABNORMAL HIGH (ref 8.4–10.5)
Chloride: 105 meq/L (ref 96–112)
Creatinine, Ser: 1.51 mg/dL — ABNORMAL HIGH (ref 0.40–1.50)
GFR: 46.82 mL/min — ABNORMAL LOW (ref >60.00)
Glucose, Bld: 128 mg/dL — ABNORMAL HIGH (ref 70–99)
Potassium: 5.3 meq/L — ABNORMAL HIGH (ref 3.5–5.1)
Sodium: 141 meq/L (ref 135–145)

## 2024-07-07 LAB — MICROALBUMIN / CREATININE URINE RATIO
Creatinine,U: 112.2 mg/dL
Microalb Creat Ratio: 31.9 mg/g — ABNORMAL HIGH (ref 0.0–30.0)
Microalb, Ur: 3.6 mg/dL — ABNORMAL HIGH (ref 0.0–1.9)

## 2024-07-07 LAB — HEMOGLOBIN A1C: Hgb A1c MFr Bld: 7.5 % — ABNORMAL HIGH (ref 4.6–6.5)

## 2024-07-07 NOTE — Telephone Encounter (Signed)
 [facility] a call to follow up on pt refill shipment on Trulicity  spoke with a representative said they have mail out refill today, but will need a new prescription after this one pt is enrolled until 12/15/2024

## 2024-07-07 NOTE — Patient Instructions (Addendum)
 It was a pleasure seeing you today!  -Labs ordered (CBC,CMP, A1C, lipid panel, and microalbumin) to assess hemoglobin, red blood cells, white blood cells, kidney and liver function, glucose levels, cholesterol levels, and to see if there is damage to kidneys. - Continue taking Trulicity  4.5mg  every 7 days, and metformin  XR 500mg  for type 2 diabetes. Continue monitoring blood sugar levels occasionally.  - Continue taking prescribed olmesartan  40mg  daily.  -Continue taking fenofibrate  and rosuvastain for cholesterol and trigylceride levels.  - Continue taking tamsulosin  as needed for kidney stones.  - Continue taking topiramate  for weight loss.  - Lifestyle modifications encouraged- well- balanced diet with plenty of fruits and vegetables. 64-100oz of water  daily is recommended. 150 minutes of exercise weekly is recommended.   Follow-up in 3 months for chronic management

## 2024-07-07 NOTE — Progress Notes (Signed)
 Established Patient Office Visit  Subjective   Patient ID: Aaron Burns, male    DOB: Jul 26, 1956  Age: 68 y.o. MRN: [id number]  Chronic management visit  HPI  HTN: Chronic. Patient is prescribed Olmesartan  40mg  daily. He reports he has a machine, but does not monitor blood pressure. Denies CP, SHOB, HA, dizziness, lightheadedness, or lower extremity edema.    Diabetes: Chronic. Patient is prescribed Dulaglutide  4.5mg  injection weekly, Metformin  XR 500mg  , 1 tablet in AM, lunch, and 2 tablets at dinner.  He reports he monitors his blood glucose once a week, ranges 98-118. Participates in silver sneakers at [employer] once a week. Denies polydipsia, polyuria, nausea and vomiting.     Hyperlipidemia: Chronic. Patient is prescribed Fenofibrate  145mg  daily, and Rosuvastatin  5mg  once a week. Denies muscle aches and abdominal pain.   Kidney stones: Intermittent. Patient takes Tamsulosin  0.4mg  daily PRN for kidney stones.    Obesity: Chronic. Patient is taking Topiramate  25mg  tablet, 1-2 tablets at bedtime. He is taking only 1 tablet at night. He does not think it helping as much. General diet, tries to cut portions.   Review of Systems  Respiratory:  Negative for shortness of breath.   Cardiovascular:  Negative for chest pain, palpitations and leg swelling.  Gastrointestinal:  Negative for abdominal pain, diarrhea, nausea and vomiting.  Neurological:  Positive for headaches (occ from eye strain). Negative for dizziness.    Objective:  BP 130/84   Pulse 70   Temp (!) 97.5 F (36.4 C) (Oral)   Ht 5' 9 (1.753 m)   Wt 238 lb (108 kg)   SpO2 98%   BMI 35.15 kg/m  BP Readings from Last 3 Encounters:  07/07/24 130/84  04/07/24 112/86  11/04/23 110/66      Physical Exam Constitutional:      Appearance: Normal appearance. He is obese.  HENT:     Head: Normocephalic and atraumatic.  Eyes:     Extraocular Movements: Extraocular movements intact.     Conjunctiva/sclera: Conjunctivae  normal.  Cardiovascular:     Rate and Rhythm: Normal rate and regular rhythm.     Pulses: Normal pulses.     Heart sounds: Normal heart sounds.  Pulmonary:     Effort: Pulmonary effort is normal.     Breath sounds: Normal breath sounds.  Abdominal:     General: Bowel sounds are normal.  Musculoskeletal:        General: Normal range of motion.     Cervical back: Normal range of motion.  Skin:    General: Skin is warm and dry.     Capillary Refill: Capillary refill takes less than 2 seconds.  Neurological:     Mental Status: He is alert and oriented to person, place, and time.  Psychiatric:        Mood and Affect: Mood normal.        Behavior: Behavior normal.        Thought Content: Thought content normal.        Judgment: Judgment normal.    Last CBC Lab Results  Component Value Date   WBC 7.7 04/07/2024   HGB 15.0 04/07/2024   HCT 46.2 04/07/2024   MCV 92.8 04/07/2024   MCH 29.8 11/04/2023   RDW 14.1 04/07/2024   PLT 249.0 04/07/2024   Last metabolic panel Lab Results  Component Value Date   GLUCOSE 141 (H) 04/07/2024   NA 143 04/07/2024   K 5.2 No hemolysis seen (H) 04/07/2024  CL 104 04/07/2024   CO2 30 04/07/2024   BUN 32 (H) 04/07/2024   CREATININE 1.66 (H) 04/07/2024   GFR 41.86 (L) 04/07/2024   CALCIUM  10.9 (H) 04/07/2024   PROT 7.7 04/07/2024   ALBUMIN 4.7 04/07/2024   LABGLOB 3.0 06/05/2022   BILITOT 0.4 04/07/2024   ALKPHOS 39 04/07/2024   AST 28 04/07/2024   ALT 39 04/07/2024   ANIONGAP 9 06/15/2023   Last lipids Lab Results  Component Value Date   CHOL 127 04/07/2024   HDL 36.70 (L) 04/07/2024   LDLCALC 35 04/07/2024   TRIG 278.0 (H) 04/07/2024   CHOLHDL 3 04/07/2024   Last hemoglobin A1c Lab Results  Component Value Date   HGBA1C 7.3 (H) 04/07/2024      The ASCVD Risk score (Arnett DK, et al., 2019) failed to calculate for the following reasons:   The valid total cholesterol range is 130 to 320 mg/dL    Assessment & Plan:    Problem List Items Addressed This Visit       Cardiovascular and Mediastinum   Hypertension - Primary   Relevant Orders   CBC with Differential/Platelets   CMP     Endocrine   Hyperlipidemia associated with type 2 diabetes mellitus (HCC)   Relevant Orders   Lipid Panel     Genitourinary   Kidney stones     Other   Class 1 obesity due to excess calories with serious comorbidity and body mass index (BMI) of 34.0 to 34.9 in adult   Relevant Orders   CBC with Differential/Platelets   CMP   Hemoglobin A1c   Other Visit Diagnoses       Diabetes mellitus without complication (HCC)       Relevant Orders   Hemoglobin A1c   Microalbumin/Creatinine Ratio, Urine     Adult general medical examination       Relevant Orders   CBC with Differential/Platelets   CMP   Hemoglobin A1c   Lipid Panel     Hypokalemia          -Labs ordered (CBC,CMP, A1C, lipid panel, and microalbumin) to assess hemoglobin, red blood cells, white blood cells, kidney and liver function, glucose levels, cholesterol levels, and to see if there is damage to kidneys.  Diabetes - Continue taking Trulicity  4.5mg  every 7 days, and metformin  XR 500mg  for type 2 diabetes. Continue monitoring blood sugar levels occasionally.  -Patient had an interest in a stronger GLP1. Education was given on how it could be more expensive. Patient would like to continue Trulicity  at this time. -Paperwork for assistance on Trulicity  will be filled out and sent in.   HTN - Continue taking prescribed olmesartan  40mg  daily.   Hyperlipidemia -Continue taking fenofibrate  and rosuvastain for cholesterol and trigylceride levels.   Hx of kidney stones - Continue taking tamsulosin  as needed for kidney stones.   Obesity - Continue taking topiramate  for weight loss.  - Lifestyle modifications encouraged- well- balanced diet with plenty of fruits and vegetables. 64-100oz of water  daily is recommended. 150 minutes of exercise weekly is  recommended.   Follow-up in 3 months for chronic management Return in about 3 months (around 10/07/2024).    Lum KANDICE Quan, RN

## 2024-07-13 ENCOUNTER — Ambulatory Visit: Payer: Self-pay | Admitting: Family Medicine

## 2024-07-13 DIAGNOSIS — E119 Type 2 diabetes mellitus without complications: Secondary | ICD-10-CM

## 2024-07-13 DIAGNOSIS — N289 Disorder of kidney and ureter, unspecified: Secondary | ICD-10-CM

## 2024-07-20 MED ORDER — EMPAGLIFLOZIN 10 MG PO TABS: 10.0000 mg | ORAL_TABLET | Freq: Every day | ORAL | 0 refills | Status: DC

## 2024-09-06 ENCOUNTER — Other Ambulatory Visit: Payer: Self-pay | Admitting: Nephrology

## 2024-09-06 DIAGNOSIS — N1832 Chronic kidney disease, stage 3b: Secondary | ICD-10-CM

## 2024-09-07 LAB — LAB REPORT - SCANNED
Albumin, Urine POC: 14.4
Calcium: 11.6
Creatinine, POC: 60.6 mg/dL
EGFR: 44
Microalb Creat Ratio: 24
PTH: 13
TSH: 2.39 (ref 0.41–5.90)

## 2024-09-08 ENCOUNTER — Ambulatory Visit
Admission: RE | Admit: 2024-09-08 | Discharge: 2024-09-08 | Disposition: A | Discharge status: Home / Self Care | Source: Ambulatory Visit | Attending: Nephrology | Admitting: Nephrology

## 2024-09-08 DIAGNOSIS — N1832 Chronic kidney disease, stage 3b: Secondary | ICD-10-CM

## 2024-09-15 NOTE — Progress Notes (Signed)
 Virtual Visit via Video Note  I connected with Aaron Burns on 09/15/24 at 11:00 AM EDT by a video enabled telemedicine application and verified that I am speaking with the correct person using two identifiers.  Location: Patient: at home Provider: [address], [place], [place], [address]    I discussed the limitations of evaluation and management by telemedicine and the availability of in person appointments. The patient expressed understanding and agreed to proceed.

## 2024-10-07 ENCOUNTER — Ambulatory Visit: Admitting: Family Medicine

## 2024-10-07 VITALS — BP 124/70 | HR 82 | Temp 97.9°F | Ht 69.0 in | Wt 234.4 lb

## 2024-10-07 DIAGNOSIS — E785 Hyperlipidemia, unspecified: Secondary | ICD-10-CM

## 2024-10-07 DIAGNOSIS — E1169 Type 2 diabetes mellitus with other specified complication: Secondary | ICD-10-CM

## 2024-10-07 DIAGNOSIS — E1122 Type 2 diabetes mellitus with diabetic chronic kidney disease: Secondary | ICD-10-CM

## 2024-10-07 DIAGNOSIS — Z23 Encounter for immunization: Secondary | ICD-10-CM

## 2024-10-07 DIAGNOSIS — N1831 Chronic kidney disease, stage 3a: Secondary | ICD-10-CM | POA: Diagnosis not present

## 2024-10-07 DIAGNOSIS — Z87442 Personal history of urinary calculi: Secondary | ICD-10-CM | POA: Diagnosis not present

## 2024-10-07 DIAGNOSIS — Z7984 Long term (current) use of oral hypoglycemic drugs: Secondary | ICD-10-CM

## 2024-10-07 DIAGNOSIS — Z6834 Body mass index (BMI) 34.0-34.9, adult: Secondary | ICD-10-CM

## 2024-10-07 DIAGNOSIS — I10 Essential (primary) hypertension: Secondary | ICD-10-CM

## 2024-10-07 DIAGNOSIS — E66811 Obesity, class 1: Secondary | ICD-10-CM

## 2024-10-07 DIAGNOSIS — Z7985 Long-term (current) use of injectable non-insulin antidiabetic drugs: Secondary | ICD-10-CM

## 2024-10-07 DIAGNOSIS — Z0001 Encounter for general adult medical examination with abnormal findings: Secondary | ICD-10-CM

## 2024-10-07 DIAGNOSIS — Z79899 Other long term (current) drug therapy: Secondary | ICD-10-CM

## 2024-10-07 DIAGNOSIS — E6609 Other obesity due to excess calories: Secondary | ICD-10-CM

## 2024-10-07 LAB — LIPID PANEL
Cholesterol: 146 mg/dL (ref 0–200)
HDL: 32.7 mg/dL — ABNORMAL LOW (ref >39.00)
NonHDL: 113.73
Total CHOL/HDL Ratio: 4
Triglycerides: 462 mg/dL — ABNORMAL HIGH (ref 0.0–149.0)
VLDL: 92.4 mg/dL — ABNORMAL HIGH (ref 0.0–40.0)

## 2024-10-07 LAB — COMPREHENSIVE METABOLIC PANEL WITH GFR
ALT: 31 U/L (ref 0–53)
AST: 23 U/L (ref 0–37)
Albumin: 4.7 g/dL (ref 3.5–5.2)
Alkaline Phosphatase: 40 U/L (ref 39–117)
BUN: 35 mg/dL — ABNORMAL HIGH (ref 6–23)
CO2: 27 meq/L (ref 19–32)
Calcium: 10.9 mg/dL — ABNORMAL HIGH (ref 8.4–10.5)
Chloride: 106 meq/L (ref 96–112)
Creatinine, Ser: 1.55 mg/dL — ABNORMAL HIGH (ref 0.40–1.50)
GFR: 45.29 mL/min — ABNORMAL LOW (ref >60.00)
Glucose, Bld: 127 mg/dL — ABNORMAL HIGH (ref 70–99)
Potassium: 5.1 meq/L (ref 3.5–5.1)
Sodium: 142 meq/L (ref 135–145)
Total Bilirubin: 0.3 mg/dL (ref 0.2–1.2)
Total Protein: 7.6 g/dL (ref 6.0–8.3)

## 2024-10-07 LAB — LDL CHOLESTEROL, DIRECT: Direct LDL: 66 mg/dL

## 2024-10-07 LAB — HEMOGLOBIN A1C: Hgb A1c MFr Bld: 7.3 % — ABNORMAL HIGH (ref 4.6–6.5)

## 2024-10-07 MED ORDER — TAMSULOSIN HCL 0.4 MG PO CAPS: ORAL_CAPSULE | ORAL | 2 refills | Status: AC

## 2024-10-07 NOTE — Progress Notes (Signed)
 Established Patient Office Visit   Subjective:  Patient ID: Aaron Burns, male    DOB: [birth year]  Age: 68 y.o. MRN: [id number]  Chief Complaint  Patient presents with   Medical Management of Chronic Issues    Three month follow-up     HPI HTN: Chronic. Patient is prescribed Olmesartan  40mg  daily. He reports he has a machine, but does not monitor blood pressure. Denies CP, SHOB, HA, dizziness, lightheadedness, or lower extremity edema.  BP Readings from Last 3 Encounters:  10/07/24 124/70  07/07/24 130/84  04/07/24 112/86    Diabetes: Chronic. Patient is prescribed Dulaglutide  4.5mg  injection weekly, Metformin  XR 500mg  , 1 tablet in AM, lunch, and 2 tablets at dinner, and Jardiance  10mg  daily.  He reports he monitors his blood glucose once a week, ranges 105-121. Participates in silver sneakers at [employer] once a week. Weight lifting at home. Denies polydipsia, polyuria, nausea and vomiting.     Hyperlipidemia: Chronic. Patient is prescribed Fenofibrate  145mg  daily, and Rosuvastatin  5mg  once a week. Denies muscle aches and abdominal pain.    Kidney stones: Intermittent. Patient takes Tamsulosin  0.4mg  daily PRN for kidney stones.    Obesity: Chronic. Patient is taking Topiramate  25mg  tablet, 1-2 tablets at bedtime. He is taking only 1 tablet at night. He does not think it helping as much. General diet, tries to cut portions.  Wt Readings from Last 3 Encounters:  10/07/24 234 lb 6.4 oz (106.3 kg)  07/07/24 238 lb (108 kg)  05/13/24 232 lb (105.2 kg)    ROS See HPI above     Objective:   BP 124/70   Pulse 82   Temp 97.9 F (36.6 C)   Ht 5' 9 (1.753 m)   Wt 234 lb 6.4 oz (106.3 kg)   SpO2 98%   BMI 34.61 kg/m    Physical Exam Vitals reviewed.  Constitutional:      General: He is not in acute distress.    Appearance: Normal appearance. He is obese. He is not ill-appearing, toxic-appearing or diaphoretic.  HENT:     Head: Normocephalic and atraumatic.  Eyes:      General:        Right eye: No discharge.        Left eye: No discharge.     Conjunctiva/sclera: Conjunctivae normal.  Cardiovascular:     Rate and Rhythm: Normal rate and regular rhythm.     Heart sounds: Normal heart sounds. No murmur heard.    No friction rub. No gallop.  Pulmonary:     Effort: Pulmonary effort is normal. No respiratory distress.     Breath sounds: Normal breath sounds.  Musculoskeletal:        General: Normal range of motion.  Skin:    General: Skin is warm and dry.  Neurological:     General: No focal deficit present.     Mental Status: He is alert and oriented to person, place, and time. Mental status is at baseline.  Psychiatric:        Mood and Affect: Mood normal.        Behavior: Behavior normal.        Thought Content: Thought content normal.        Judgment: Judgment normal.     Results for orders placed or performed in visit on 10/07/24  Lipid panel  Result Value Ref Range   Cholesterol 146 0 - 200 mg/dL   Triglycerides (H) 0.0 - 149.0 mg/dL    537.0  Triglyceride is over 400; calculations on Lipids are invalid.   HDL 32.70 (L) >39.00 mg/dL   VLDL 07.5 (H) 0.0 - 59.9 mg/dL   Total CHOL/HDL Ratio 4    NonHDL 113.73   Hemoglobin A1c  Result Value Ref Range   Hgb A1c MFr Bld 7.3 (H) 4.6 - 6.5 %  Comprehensive metabolic panel with GFR  Result Value Ref Range   Sodium 142 135 - 145 mEq/L   Potassium 5.1 3.5 - 5.1 mEq/L   Chloride 106 96 - 112 mEq/L   CO2 27 19 - 32 mEq/L   Glucose, Bld 127 (H) 70 - 99 mg/dL   BUN 35 (H) 6 - 23 mg/dL   Creatinine, Ser 8.44 (H) 0.40 - 1.50 mg/dL   Total Bilirubin 0.3 0.2 - 1.2 mg/dL   Alkaline Phosphatase 40 39 - 117 U/L   AST 23 0 - 37 U/L   ALT 31 0 - 53 U/L   Total Protein 7.6 6.0 - 8.3 g/dL   Albumin 4.7 3.5 - 5.2 g/dL   GFR 54.70 (L) >39.99 mL/min   Calcium  10.9 (H) 8.4 - 10.5 mg/dL  LDL cholesterol, direct  Result Value Ref Range   Direct LDL 66.0 mg/dL    The 89-bzjm ASCVD risk score (Arnett DK,  et al., 2019) is: 40.5%    Assessment & Plan:  Primary hypertension Assessment & Plan: Stable. Continue Olmesartan  40mg  daily. Ordered CMP.    Orders: -     Comprehensive metabolic panel with GFR  Type 2 diabetes mellitus with stage 3a chronic kidney disease, without long-term current use of insulin  [facility]) Assessment & Plan: Not at goal in July. Continue Dulaglutide  4.5mg  injection weekly and Metformin  XR 2,000mg  daily, and Jardiance  10mg . Ordered A1c, CMP to assess kidney function. UTD on foot exam, ophthalmology exam, and microalbumin/creatinine. Patient is on an ARB and statin.   Orders: -     Hemoglobin A1c -     Comprehensive metabolic panel with GFR  Hyperlipidemia associated with type 2 diabetes mellitus (HCC) Assessment & Plan: Elevated at last visit. Continue Fenofibrate  145mg  daily and Rosuvastatin  5mg  once a week. Ordered lipid panel and CMP to assess liver function.   Orders: -     Lipid panel -     Comprehensive metabolic panel with GFR  History of kidney stones Assessment & Plan: Stable. Not had a recent kidney stone. Continue taking Tamsulosin  0.4mg  daily PRN for kidney stones.   Orders: -     Tamsulosin  HCl; TAKE 1 CAPSULE BY MOUTH DAILY AS NEEDED FOR KIDNEY STONES  Dispense: 90 capsule; Refill: 2  Class 1 obesity due to excess calories with serious comorbidity and body mass index (BMI) of 34.0 to 34.9 in adult Assessment & Plan: Four pound weight loss since July 2025. Continue Topiramate  25mg  daily     Need for influenza vaccination -     Flu vaccine HIGH DOSE PF(Fluzone Trivalent)  Other orders -     LDL cholesterol, direct  1.Review health maintenance: -Covid booster: Declines  -Influenza vaccine: Administered -PNA vaccine and Zoster vaccine: May obtain at local pharmacy.   Return in about 3 months (around 01/07/2025) for physical.   Creek Gan, NP

## 2024-10-11 ENCOUNTER — Telehealth: Payer: Self-pay

## 2024-10-11 NOTE — Progress Notes (Signed)
   10/11/2024  Patient ID: Aaron Burns, male   DOB: [birth year], 68 y.o.   MRN: [id number]  Submitted patient's application for renewal for Jardiance  PAP through [facility].  [employer] application for [employer]  was missing patient's signature, contacted patient. He is aware and will come by to complete this week.  Jon VEAR Lindau, PharmD Clinical Pharmacist [phone]

## 2024-10-12 ENCOUNTER — Ambulatory Visit: Payer: Self-pay | Admitting: Family Medicine

## 2024-10-12 DIAGNOSIS — E781 Pure hyperglyceridemia: Secondary | ICD-10-CM

## 2024-10-12 DIAGNOSIS — E1122 Type 2 diabetes mellitus with diabetic chronic kidney disease: Secondary | ICD-10-CM

## 2024-10-12 MED ORDER — EMPAGLIFLOZIN 25 MG PO TABS: 25.0000 mg | ORAL_TABLET | Freq: Every day | ORAL | 3 refills | Status: AC

## 2024-10-12 MED ORDER — ICOSAPENT ETHYL 1 G PO CAPS: 2.0000 g | ORAL_CAPSULE | Freq: Two times a day (BID) | ORAL | 2 refills | Status: DC

## 2024-10-12 NOTE — Assessment & Plan Note (Addendum)
 Stable. Continue Olmesartan  40mg  daily. Ordered CMP.

## 2024-10-12 NOTE — Assessment & Plan Note (Signed)
 Elevated at last visit. Continue Fenofibrate  145mg  daily and Rosuvastatin  5mg  once a week. Ordered lipid panel and CMP to assess liver function.

## 2024-10-12 NOTE — Assessment & Plan Note (Signed)
 Stable. Not had a recent kidney stone. Continue taking Tamsulosin  0.4mg  daily PRN for kidney stones.

## 2024-10-12 NOTE — Assessment & Plan Note (Deleted)
 Stable. Not had a recent kidney stone. Continue taking Tamsulosin  0.4mg  daily PRN for kidney stones.

## 2024-10-12 NOTE — Assessment & Plan Note (Signed)
 Four pound weight loss since July 2025. Continue Topiramate  25mg  daily

## 2024-10-12 NOTE — Patient Instructions (Addendum)
-  It was good to see you.  -Influenza vaccine provided.  -May obtain all other vaccines at your local pharmacy. -Continue all medications. -Ordered labs. Office will call with lab results and will be available via MyChart. -Follow up in 3 months for a physical.

## 2024-10-12 NOTE — Assessment & Plan Note (Signed)
 Not at goal in July. Continue Dulaglutide  4.5mg  injection weekly and Metformin  XR 2,000mg  daily, and Jardiance  10mg . Ordered A1c, CMP to assess kidney function. UTD on foot exam, ophthalmology exam, and microalbumin/creatinine. Patient is on an ARB and statin.

## 2024-10-15 ENCOUNTER — Other Ambulatory Visit: Payer: Self-pay | Admitting: Family Medicine

## 2024-10-15 DIAGNOSIS — E119 Type 2 diabetes mellitus without complications: Secondary | ICD-10-CM

## 2024-10-18 ENCOUNTER — Telehealth: Payer: Self-pay

## 2024-10-18 NOTE — Telephone Encounter (Signed)
 Called [employer] Received a letter from them and want it to clarify:spoke with a representative explain on provider portion they are missing provider signature and they are missing on provider portion pt DOB.

## 2024-10-21 ENCOUNTER — Telehealth: Payer: Self-pay | Admitting: Family Medicine

## 2024-10-21 NOTE — Telephone Encounter (Signed)
 Patient Assistance form to be filled out--placed in provider's folder per Candice.  Fax to: [phone] upon completion.

## 2024-10-21 NOTE — Telephone Encounter (Signed)
 Call [employer] to follow up [facility] Jon not been clear if they have received pt re-enrollment pap for 2026. Spoke with representative explain at this time the application was received and is pending until the old one expired on 12/15/24 on 12/16/24 they will started using the new application also they said last refill pt received was in July pt is due,rep. was able to process one and pt will received with in 7-10 days they said reason for then not mailing out they were waiting on new script from provider.

## 2024-10-25 ENCOUNTER — Telehealth: Payer: Self-pay

## 2024-10-25 NOTE — Progress Notes (Signed)
   10/25/2024  Patient ID: Aaron Burns, male   DOB: 1956/01/11, 68 y.o.   MRN: [id number]  Received notification from [facility] that not all pages of the Jardiance  renewal app had been received.  Faxed in completed full application, along with copy of insurance card and proof of income. Confirmation received for all pages.  Jon VEAR Lindau, PharmD Clinical Pharmacist [phone]

## 2024-10-27 ENCOUNTER — Telehealth: Payer: Self-pay

## 2024-10-27 ENCOUNTER — Telehealth: Payer: Self-pay | Admitting: Family Medicine

## 2024-10-27 NOTE — Telephone Encounter (Signed)
 Noted sent pharmacist a message will reach out to pt to come by office to fill out paperwork.

## 2024-10-27 NOTE — Telephone Encounter (Signed)
 Copied from CRM [id number]. Topic: General - Other >> Oct 27, 2024  1:59 PM Mesmerise C wrote: Reason for CRM: Tasia from [employer] Patient Assistance program has a application but they cannot see any othe words and need it sent in again [phone]

## 2024-10-27 NOTE — Progress Notes (Addendum)
   10/27/2024  Patient ID: Aaron Burns, male   DOB: 07/25/1956, 68 y.o.   MRN: [id number]  Received notification from [facility] that patient needs to submit a new renewal application for 2026. Previously submitted application was not legible.   Notified patient and prepared new application. Patient will come by office to sign this week. Will submit once completed.  Patient completed application, faxed into company, pending decision  Jon VEAR Lindau, PharmD Clinical Pharmacist [phone]

## 2024-10-29 ENCOUNTER — Other Ambulatory Visit: Payer: Self-pay | Admitting: Family Medicine

## 2024-10-29 DIAGNOSIS — E119 Type 2 diabetes mellitus without complications: Secondary | ICD-10-CM

## 2024-11-03 ENCOUNTER — Encounter: Payer: PPO | Admitting: Nurse Practitioner

## 2024-11-08 ENCOUNTER — Telehealth: Payer: Self-pay

## 2024-11-08 NOTE — Progress Notes (Signed)
   11/08/2024  Patient ID: Aaron Burns, male   DOB: [birth year], 68 y.o.   MRN: [id number]  Received notification from [facility] [employer] that patient's Jardiance  PAP has been renewed through 12/15/25.  Jon VEAR Lindau, PharmD Clinical Pharmacist [phone]

## 2024-11-22 ENCOUNTER — Other Ambulatory Visit: Payer: Self-pay | Admitting: Family Medicine

## 2024-11-22 DIAGNOSIS — E119 Type 2 diabetes mellitus without complications: Secondary | ICD-10-CM

## 2025-01-07 ENCOUNTER — Ambulatory Visit: Admitting: Family Medicine

## 2025-01-07 ENCOUNTER — Encounter: Payer: Self-pay | Admitting: Family Medicine

## 2025-01-07 ENCOUNTER — Ambulatory Visit: Payer: Self-pay | Admitting: Family Medicine

## 2025-01-07 VITALS — BP 122/82 | HR 87 | Temp 97.9°F | Ht 69.0 in | Wt 228.0 lb

## 2025-01-07 DIAGNOSIS — E1169 Type 2 diabetes mellitus with other specified complication: Secondary | ICD-10-CM

## 2025-01-07 DIAGNOSIS — Z7985 Long-term (current) use of injectable non-insulin antidiabetic drugs: Secondary | ICD-10-CM

## 2025-01-07 DIAGNOSIS — N1832 Chronic kidney disease, stage 3b: Secondary | ICD-10-CM

## 2025-01-07 DIAGNOSIS — E1122 Type 2 diabetes mellitus with diabetic chronic kidney disease: Secondary | ICD-10-CM

## 2025-01-07 DIAGNOSIS — I10 Essential (primary) hypertension: Secondary | ICD-10-CM | POA: Diagnosis not present

## 2025-01-07 DIAGNOSIS — E785 Hyperlipidemia, unspecified: Secondary | ICD-10-CM

## 2025-01-07 DIAGNOSIS — Z0000 Encounter for general adult medical examination without abnormal findings: Secondary | ICD-10-CM | POA: Diagnosis not present

## 2025-01-07 DIAGNOSIS — N1831 Chronic kidney disease, stage 3a: Secondary | ICD-10-CM | POA: Diagnosis not present

## 2025-01-07 DIAGNOSIS — N183 Chronic kidney disease, stage 3 unspecified: Secondary | ICD-10-CM

## 2025-01-07 LAB — LIPID PANEL
Cholesterol: 157 mg/dL (ref 28–200)
HDL: 36.6 mg/dL — ABNORMAL LOW
NonHDL: 120.21
Total CHOL/HDL Ratio: 4
Triglycerides: 406 mg/dL — ABNORMAL HIGH (ref 10.0–149.0)
VLDL: 81.2 mg/dL — ABNORMAL HIGH (ref 0.0–40.0)

## 2025-01-07 LAB — MICROALBUMIN / CREATININE URINE RATIO
Creatinine,U: 116.1 mg/dL
Microalb Creat Ratio: 37.9 mg/g — ABNORMAL HIGH (ref 0.0–30.0)
Microalb, Ur: 4.4 mg/dL — ABNORMAL HIGH (ref 0.7–1.9)

## 2025-01-07 LAB — CBC WITH DIFFERENTIAL/PLATELET
Basophils Absolute: 0 K/uL (ref 0.0–0.1)
Basophils Relative: 0.6 % (ref 0.0–3.0)
Eosinophils Absolute: 0.1 K/uL (ref 0.0–0.7)
Eosinophils Relative: 1.5 % (ref 0.0–5.0)
HCT: 44.8 % (ref 39.0–52.0)
Hemoglobin: 15 g/dL (ref 13.0–17.0)
Lymphocytes Relative: 28.1 % (ref 12.0–46.0)
Lymphs Abs: 2.2 K/uL (ref 0.7–4.0)
MCHC: 33.6 g/dL (ref 30.0–36.0)
MCV: 90.7 fl (ref 78.0–100.0)
Monocytes Absolute: 0.6 K/uL (ref 0.1–1.0)
Monocytes Relative: 8.1 % (ref 3.0–12.0)
Neutro Abs: 4.9 K/uL (ref 1.4–7.7)
Neutrophils Relative %: 61.7 % (ref 43.0–77.0)
Platelets: 252 K/uL (ref 150.0–400.0)
RBC: 4.94 Mil/uL (ref 4.22–5.81)
RDW: 14.4 % (ref 11.5–15.5)
WBC: 7.9 K/uL (ref 4.0–10.5)

## 2025-01-07 LAB — COMPREHENSIVE METABOLIC PANEL WITH GFR
ALT: 33 U/L (ref 3–53)
AST: 24 U/L (ref 5–37)
Albumin: 4.6 g/dL (ref 3.5–5.2)
Alkaline Phosphatase: 41 U/L (ref 39–117)
BUN: 32 mg/dL — ABNORMAL HIGH (ref 6–23)
CO2: 27 meq/L (ref 19–32)
Calcium: 10.7 mg/dL — ABNORMAL HIGH (ref 8.4–10.5)
Chloride: 105 meq/L (ref 96–112)
Creatinine, Ser: 1.58 mg/dL — ABNORMAL HIGH (ref 0.40–1.50)
GFR: 44.18 mL/min — ABNORMAL LOW
Glucose, Bld: 135 mg/dL — ABNORMAL HIGH (ref 70–99)
Potassium: 5.1 meq/L (ref 3.5–5.1)
Sodium: 141 meq/L (ref 135–145)
Total Bilirubin: 0.4 mg/dL (ref 0.2–1.2)
Total Protein: 7.3 g/dL (ref 6.0–8.3)

## 2025-01-07 LAB — TSH: TSH: 3.04 u[IU]/mL (ref 0.35–5.50)

## 2025-01-07 LAB — HEMOGLOBIN A1C: Hgb A1c MFr Bld: 7.2 % — ABNORMAL HIGH (ref 4.6–6.5)

## 2025-01-07 LAB — LDL CHOLESTEROL, DIRECT: Direct LDL: 85 mg/dL

## 2025-01-07 NOTE — Patient Instructions (Addendum)
 It was nice to see you today! Today we: -Completed a physical exam. -Reviewed health maintenance. -Continue all medications. -Recommend a healthy diet, staying active as much as tolerated. -Ordered labs: CBC, CMP, lipids, HgA1c, TSH, Microalbumin urine. -Recommend taking OTC Fish Oil to help lower triglyceride levels. You can discontinue taking the Vascepa . -Follow up in 3 months for chronic management.

## 2025-01-07 NOTE — Progress Notes (Signed)
 "  Complete physical exam  Patient: Aaron Burns   DOB: 1956-05-05   69 y.o. Male  MRN: [id number]  Subjective:    Chief Complaint  Patient presents with   Annual Exam   Ival Pacer is a 69 y.o. male who presents today for a complete physical exam. He reports consuming a general diet. He participates in daily exercise. He generally feels well. He reports sleeping fairly well. He does not have additional problems to discuss today.   Reports that when he takes Trulicity , Vascepa  makes him throw up. He will not take it with Trulicity  for this reason. On days that he does not take Trulicity , he will take one Vascepa  tablet in the morning, and one tablet at night. He reports that he doesn't want to take Vascepa  any more after this prescription runs out.  Reports off-and-on smoking. 3- 4 cigarettes per week, socially. Judyann Ruthell Domino with Pulmonology.  Most recent fall risk assessment:    01/07/2025    8:41 AM  Fall Risk   Falls in the past year? 0  Number falls in past yr: 0  Injury with Fall? 0  Risk for fall due to : No Fall Risks  Follow up Falls evaluation completed   Most recent depression screenings:    01/07/2025    8:41 AM 07/07/2024    9:41 AM  PHQ 2/9 Scores  PHQ - 2 Score 0 0  PHQ- 9 Score 3 3      Data saved with a previous flowsheet row definition   Vision:Within last year and Dental: No current dental problems and Receives regular dental care  Past Medical History:  Diagnosis Date   Diabetes mellitus, type 2 (HCC)    Elevated hemoglobin A1c    Heart murmur    at birth   History of kidney stones 2011   Hypertension    Leg DVT (deep venous thromboembolism), chronic, left (HCC) 05/16/2020   Dr. Krystal Early evaluated 06/27/2020, advised d/c xarelto , no further treatment recommended for this chronic DVT.    Presumed provoked bilateral DVT following lumbar surgery in Oct 2020, follow up study in 05/13/2020 showed:  LEFT: - Findings consistent with chronic deep vein  thrombosis involving the left peroneal veins. - No cystic structure found in the popliteal fossa. - Echogenic structures noted   Low back pain    Radiates to left ankle.  Injured after falling on back.   Morbid obesity (BMI 36. 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   [facility]   Renal disorder    Tuberculosis    2014 dx, was treated with medications for about 4 months   Past Surgical History:  Procedure Laterality Date   ABDOMINAL EXPOSURE N/A 10/07/2019   Procedure: ABDOMINAL EXPOSURE;  Surgeon: Oris Krystal FALCON, MD;  Location: [facility] OR;  Service: Vascular;  Laterality: N/A;   ANTERIOR LUMBAR FUSION N/A 10/07/2019   Procedure: Oblique lateral interbody fusion LUMBAR FOUR-FIVE, anterior lateral interbody fusion LUMBAR THREE-FOUR;  Surgeon: Burnetta Aures, MD;  Location: [facility] OR;  Service: Orthopedics;  Laterality: N/A;  TAB BLOCK WITH EXPAREL    CHOLECYSTECTOMY  07/27/2012   Procedure: LAPAROSCOPIC CHOLECYSTECTOMY WITH INTRAOPERATIVE CHOLANGIOGRAM;  Surgeon: Debby LABOR. Cornett, MD;  Location: [facility] OR;  Service: General;  Laterality: N/A;   COLONOSCOPY     CYSTOSCOPY/RETROGRADE/URETEROSCOPY/STONE EXTRACTION WITH BASKET  5/11   HERNIA REPAIR     as a baby   KNEE SURGERY Right 11/25/11 and 2015  arthroscopic, cortisone injections done also   TOTAL KNEE ARTHROPLASTY Right 06/06/2015   Procedure: RIGHT TOTAL KNEE ARTHROPLASTY;  Surgeon: Donnice Car, MD;  Location: [facility] ORS;  Service: Orthopedics;  Laterality: Right;   WISDOM TOOTH EXTRACTION  yrs ago   Social History[1] Social History   Socioeconomic History   Marital status: Married    Spouse name: Not on file   Number of children: 2   Years of education: Not on file   Highest education level: High school graduate  Occupational History   Not on file  Tobacco Use   Smoking status: Some Days    Current packs/day: 0.00    Average packs/day: 1 pack/day for 46.8 years (46.8 ttl pk-yrs)    Types:  Cigarettes    Start date: 12/16/1972    Last attempt to quit: 09/27/2019    Years since quitting: 5.2   Smokeless tobacco: Never   Tobacco comments:    06/02/24- pt states that he does not buy cigarettes.  Only smokes occasionally and borrows from friends.   Vaping Use   Vaping status: Never Used  Substance and Sexual Activity   Alcohol use: Not Currently   Drug use: No   Sexual activity: Not Currently    Partners: Female  Other Topics Concern   Not on file  Social History Narrative   Not on file   Social Drivers of Health   Tobacco Use: High Risk (01/07/2025)   Patient History    Smoking Tobacco Use: Some Days    Smokeless Tobacco Use: Never    Passive Exposure: Not on file  Financial Resource Strain: Low Risk (04/07/2024)   Overall Financial Resource Strain (CARDIA)    Difficulty of Paying Living Expenses: Not hard at all  Food Insecurity: No Food Insecurity (04/07/2024)   Hunger Vital Sign    Worried About Running Out of Food in the Last Year: Never true    Ran Out of Food in the Last Year: Never true  Transportation Needs: No Transportation Needs (04/07/2024)   PRAPARE - Administrator, Civil Service (Medical): No    Lack of Transportation (Non-Medical): No  Physical Activity: Insufficiently Active (04/07/2024)   Exercise Vital Sign    Days of Exercise per Week: 2 days    Minutes of Exercise per Session: 30 min  Stress: No Stress Concern Present (04/07/2024)   [employer] of Occupational Health - Occupational Stress Questionnaire    Feeling of Stress : Only a little  Social Connections: Moderately Isolated (04/07/2024)   Social Connection and Isolation Panel    Frequency of Communication with Friends and Family: Three times a week    Frequency of Social Gatherings with Friends and Family: Once a week    Attends Religious Services: Never    Database Administrator or Organizations: No    Attends Banker Meetings: Never    Marital Status:  Married  Catering Manager Violence: Not At Risk (04/07/2024)   Humiliation, Afraid, Rape, and Kick questionnaire    Fear of Current or Ex-Partner: No    Emotionally Abused: No    Physically Abused: No    Sexually Abused: No  Depression (PHQ2-9): Low Risk (01/07/2025)   Depression (PHQ2-9)    PHQ-2 Score: 3  Alcohol Screen: Low Risk (04/07/2024)   Alcohol Screen    Last Alcohol Screening Score (AUDIT): 0  Housing: Low Risk (04/07/2024)   Housing Stability Vital Sign    Unable to Pay for Housing in the  Last Year: No    Number of Times Moved in the Last Year: 0    Homeless in the Last Year: No  Utilities: Not At Risk (04/07/2024)   [employer] Utilities    Threatened with loss of utilities: No  Health Literacy: Adequate Health Literacy (04/07/2024)   B1300 Health Literacy    Frequency of need for help with medical instructions: Never   Family Status  Relation Name Status   Mother  Deceased at age 45       kidney failure   Father  Deceased at age 53       Brain tumor   Sister  Alive   Brother  Deceased   Daughter  Alive   Daughter  Alive   MGM  Deceased   MGF  Deceased   PGM  Deceased   PGF  Deceased  No partnership data on file   Family History  Problem Relation Age of Onset   Hypertension Mother    Diabetes Mother    Kidney failure Mother    Diabetes Father    Other Father        Brain tumor   Diabetes Brother    Diabetes Daughter    Allergies[2]    Patient Care Team: Billy Philippe SAUNDERS, NP as PCP - General (Family Medicine) [place], [facility] Ruthell Lauraine FALCON, NP as Nurse Practitioner (Pulmonary Disease)   Show/hide medication list[3]  Review of Systems  Constitutional: Negative.   HENT: Negative.    Eyes: Negative.   Respiratory: Negative.    Cardiovascular: Negative.   Gastrointestinal: Negative.   Genitourinary: Negative.   Musculoskeletal: Negative.   Skin: Negative.   Neurological: Negative.   Psychiatric/Behavioral: Negative.         Objective:    BP 122/82   Pulse 87   Temp 97.9 F (36.6 C) (Oral)   Ht 5' 9 (1.753 m)   Wt 228 lb (103.4 kg)   SpO2 98%   BMI 33.67 kg/m  BP Readings from Last 3 Encounters:  01/07/25 122/82  10/07/24 124/70  07/07/24 130/84   Wt Readings from Last 3 Encounters:  01/07/25 228 lb (103.4 kg)  10/07/24 234 lb 6.4 oz (106.3 kg)  07/07/24 238 lb (108 kg)   Physical Exam Constitutional:      Appearance: Normal appearance. He is obese.  HENT:     Head: Normocephalic.     Right Ear: Tympanic membrane, ear canal and external ear normal.     Left Ear: Tympanic membrane, ear canal and external ear normal.     Nose: Nose normal.     Mouth/Throat:     Mouth: Mucous membranes are moist.     Pharynx: Oropharynx is clear.  Eyes:     Extraocular Movements: Extraocular movements intact.     Conjunctiva/sclera: Conjunctivae normal.     Pupils: Pupils are equal, round, and reactive to light.  Cardiovascular:     Rate and Rhythm: Normal rate and regular rhythm.     Pulses: Normal pulses.     Heart sounds: Normal heart sounds.  Pulmonary:     Effort: Pulmonary effort is normal.     Breath sounds: Normal breath sounds.  Abdominal:     General: Bowel sounds are normal.     Palpations: Abdomen is soft.  Musculoskeletal:        General: Normal range of motion.     Cervical back: Normal range of motion.  Skin:    General: Skin is warm  and dry.  Neurological:     General: No focal deficit present.     Mental Status: He is alert and oriented to person, place, and time. Mental status is at baseline.  Psychiatric:        Mood and Affect: Mood normal.        Behavior: Behavior normal.        Thought Content: Thought content normal.        Judgment: Judgment normal.     No results found for any visits on 01/07/25.     Assessment & Plan:    Routine Health Maintenance and Physical Exam  Immunization History  Administered Date(s) Administered   INFLUENZA, HIGH DOSE SEASONAL PF  10/04/2021, 10/15/2022, 11/04/2023, 10/07/2024   Influenza Inj Mdck Quad With Preservative 10/10/2017   Influenza Split 12/01/2013   Influenza, Quadrivalent, Recombinant, Inj, Pf 08/14/2019   Influenza,inj,quad, With Preservative 08/23/2019   Influenza-Unspecified 10/02/2018, 12/11/2020   Pneumococcal Polysaccharide-23 10/10/2017   Td 12/17/2003   Tdap 02/14/2015   Health Maintenance  Topic Date Due   COVID-19 Vaccine (1) Never done   Zoster Vaccines- Shingrix (1 of 2) Never done   Pneumococcal Vaccine: 50+ Years (2 of 2 - PCV) 10/10/2018   OPHTHALMOLOGY EXAM  01/27/2025   DTaP/Tdap/Td (3 - Td or Tdap) 02/13/2025   Diabetic kidney evaluation - Urine ACR  03/03/2025   FOOT EXAM  04/07/2025   HEMOGLOBIN A1C  04/07/2025   Medicare Annual Wellness (AWV)  05/13/2025   Lung Cancer Screening  06/03/2025   Diabetic kidney evaluation - eGFR measurement  10/07/2025   Colonoscopy  12/18/2028   Influenza Vaccine  Completed   Hepatitis C Screening  Completed   Meningococcal B Vaccine  Aged Out   Discussed health benefits of physical activity, and encouraged him to engage in regular exercise appropriate for his age and condition.  Annual physical exam -     CBC with Differential/Platelet -     Comprehensive metabolic panel with GFR -     Lipid panel -     Hemoglobin A1c -     TSH  Primary hypertension -     Comprehensive metabolic panel with GFR  Type 2 diabetes mellitus with stage 3a chronic kidney disease, without long-term current use of insulin  (HCC) -     Comprehensive metabolic panel with GFR -     Hemoglobin A1c -     Microalbumin / creatinine urine ratio  Hyperlipidemia associated with type 2 diabetes mellitus (HCC) -     Lipid panel  CKD stage 3 due to type 2 diabetes mellitus (HCC) -     Comprehensive metabolic panel with GFR  CKD stage 3b, GFR 30-44 ml/min (HCC)   Health Maintenance: -Pneumococcal: Denies. -COVID: Denies. -Zoster: Denies.  -Completed a physical  exam. -Reviewed health maintenance. -Continue all medications. -Recommend a healthy diet, staying active as much as tolerated. -Patient prefers to smoke 3-4 cigarettes on Fridays night at social events. Continues with pulmonary for yearly CT lung screenings.  -Ordered labs: CBC, CMP, lipids, HgA1c, TSH, Microalbumin urine based on chronic management and annual physical.  -Recommend taking OTC Fish Oil to help lower triglyceride levels. He will discontinue to take Vascepa . -Follow up in 3 months for chronic management.    JoAnna Williamson, NP     [1]  Social History Tobacco Use   Smoking status: Some Days    Current packs/day: 0.00    Average packs/day: 1 pack/day for 46.8 years (46.8 ttl pk-yrs)  Types: Cigarettes    Start date: 12/16/1972    Last attempt to quit: 09/27/2019    Years since quitting: 5.2   Smokeless tobacco: Never   Tobacco comments:    06/02/24- pt states that he does not buy cigarettes.  Only smokes occasionally and borrows from friends.   Vaping Use   Vaping status: Never Used  Substance Use Topics   Alcohol use: Not Currently   Drug use: No  [2]  Allergies Allergen Reactions   Ppd [Tuberculin Purified Protein Derivative]     Pt states he is not allergic to this.SABRApositive tb test 2014, pt took tb treatment 2014  [3]  Outpatient Medications Prior to Visit  Medication Sig   acetaminophen  (TYLENOL ) 325 MG tablet Take 2 tablets (650 mg total) by mouth every 4 (four) hours as needed for mild pain ((score 1 to 3) or temp > 100.5).   aspirin  EC 81 MG tablet Take 81 mg by mouth daily. Swallow whole.   Blood Glucose Monitoring Suppl DEVI Use to check blood sugar once daily   CINNAMON PO Take 1,000 mg by mouth daily.   Dulaglutide  (TRULICITY ) 4.5 MG/0.5ML SOAJ INJECT 4.5MG  (0.5ML) UNDER THE SKIN ONCE A WEEK   empagliflozin  (JARDIANCE ) 25 MG TABS tablet Take 1 tablet (25 mg total) by mouth daily.   fenofibrate  (TRICOR ) 145 MG tablet Take   1 tablet   Daily  for  Triglycerides (Blood Fats)                                                                                                        /                         TAKE                           BY                          MOUTH   glucose blood (FREESTYLE LITE) test strip Test blood sugar once daily   JARDIANCE  10 MG TABS tablet TAKE ONE TABLET BY MOUTH DAILY   Lancets MISC Test blood sugar once daily.   Magnesium  250 MG TABS Take 1 tablet (250 mg total) by mouth 2 (two) times daily with a meal. (Patient taking differently: Take 400 mg by mouth at bedtime.)   metFORMIN  (GLUCOPHAGE -XR) 500 MG 24 hr tablet TAKE 2 TABLETS BY MOUTH 2 TIMES A DAY WITH MEALS FOR DIABETES   Multiple Vitamin (MULTIVITAMIN WITH MINERALS) TABS Take 1 tablet by mouth daily.   olmesartan  (BENICAR ) 40 MG tablet Take 1 tab daily for blood pressure goal <130/80.   rosuvastatin  (CRESTOR ) 5 MG tablet TAKE 1 TABLET BY MOUTH ONCE WEEKLY IN THE EVENING ON SUNDAYS   tamsulosin  (FLOMAX ) 0.4 MG CAPS capsule TAKE 1 CAPSULE BY MOUTH DAILY AS NEEDED FOR KIDNEY STONES   topiramate  (TOPAMAX ) 25 MG tablet Take  1 to 2 tablets  at Night  for Dieting & Weight Loss /                                                                   TAKE                                         BY                                                 MOUTH   vitamin C (ASCORBIC ACID) 500 MG tablet Take 1,000 mg by mouth daily.   zinc gluconate 50 MG tablet Take 50 mg by mouth daily.   [DISCONTINUED] icosapent  Ethyl (VASCEPA ) 1 g capsule Take 2 capsules (2 g total) by mouth 2 (two) times daily.   No facility-administered medications prior to visit.   "

## 2025-04-07 ENCOUNTER — Ambulatory Visit: Admitting: Family Medicine
